# Patient Record
Sex: Female | Born: 1935 | Race: White | Hispanic: No | Smoking: Never smoker
Health system: Southern US, Community
[De-identification: ages and names within clinical notes are randomized; demographics above are authoritative.]

## PROBLEM LIST (undated history)

## (undated) DIAGNOSIS — E039 Hypothyroidism, unspecified: Secondary | ICD-10-CM

## (undated) DIAGNOSIS — I2510 Atherosclerotic heart disease of native coronary artery without angina pectoris: Secondary | ICD-10-CM

## (undated) DIAGNOSIS — N183 Chronic kidney disease, stage 3 unspecified: Secondary | ICD-10-CM

## (undated) DIAGNOSIS — I050 Rheumatic mitral stenosis: Secondary | ICD-10-CM

## (undated) DIAGNOSIS — I4821 Permanent atrial fibrillation: Secondary | ICD-10-CM

## (undated) DIAGNOSIS — W19XXXA Unspecified fall, initial encounter: Secondary | ICD-10-CM

## (undated) DIAGNOSIS — R0600 Dyspnea, unspecified: Secondary | ICD-10-CM

## (undated) DIAGNOSIS — I10 Essential (primary) hypertension: Secondary | ICD-10-CM

## (undated) DIAGNOSIS — I340 Nonrheumatic mitral (valve) insufficiency: Secondary | ICD-10-CM

## (undated) DIAGNOSIS — I639 Cerebral infarction, unspecified: Secondary | ICD-10-CM

## (undated) DIAGNOSIS — I255 Ischemic cardiomyopathy: Secondary | ICD-10-CM

## (undated) DIAGNOSIS — I071 Rheumatic tricuspid insufficiency: Secondary | ICD-10-CM

## (undated) HISTORY — PX: CHOLECYSTECTOMY: SHX55

---

## 1998-02-03 ENCOUNTER — Other Ambulatory Visit: Admission: RE | Admit: 1998-02-03 | Discharge: 1998-02-03 | Payer: Self-pay | Admitting: Obstetrics & Gynecology

## 1999-02-13 ENCOUNTER — Other Ambulatory Visit: Admission: RE | Admit: 1999-02-13 | Discharge: 1999-02-13 | Payer: Self-pay | Admitting: Obstetrics & Gynecology

## 2000-02-15 ENCOUNTER — Other Ambulatory Visit: Admission: RE | Admit: 2000-02-15 | Discharge: 2000-02-15 | Payer: Self-pay | Admitting: Obstetrics & Gynecology

## 2001-03-10 ENCOUNTER — Other Ambulatory Visit: Admission: RE | Admit: 2001-03-10 | Discharge: 2001-03-10 | Payer: Self-pay | Admitting: Obstetrics & Gynecology

## 2002-04-02 ENCOUNTER — Other Ambulatory Visit: Admission: RE | Admit: 2002-04-02 | Discharge: 2002-04-02 | Payer: Self-pay | Admitting: Obstetrics & Gynecology

## 2004-04-21 ENCOUNTER — Other Ambulatory Visit: Admission: RE | Admit: 2004-04-21 | Discharge: 2004-04-21 | Payer: Self-pay | Admitting: Obstetrics & Gynecology

## 2005-05-10 ENCOUNTER — Other Ambulatory Visit: Admission: RE | Admit: 2005-05-10 | Discharge: 2005-05-10 | Payer: Self-pay | Admitting: Obstetrics & Gynecology

## 2005-12-07 ENCOUNTER — Encounter: Admission: RE | Admit: 2005-12-07 | Discharge: 2005-12-07 | Payer: Self-pay | Admitting: Obstetrics & Gynecology

## 2007-03-20 ENCOUNTER — Ambulatory Visit (HOSPITAL_COMMUNITY): Admission: RE | Admit: 2007-03-20 | Discharge: 2007-03-20 | Payer: Self-pay | Admitting: Family Medicine

## 2008-03-16 ENCOUNTER — Encounter: Admission: RE | Admit: 2008-03-16 | Discharge: 2008-03-16 | Payer: Self-pay | Admitting: Internal Medicine

## 2008-04-16 ENCOUNTER — Encounter: Admission: RE | Admit: 2008-04-16 | Discharge: 2008-04-16 | Payer: Self-pay | Admitting: Internal Medicine

## 2008-05-28 ENCOUNTER — Encounter (INDEPENDENT_AMBULATORY_CARE_PROVIDER_SITE_OTHER): Payer: Self-pay | Admitting: Internal Medicine

## 2008-05-28 ENCOUNTER — Ambulatory Visit: Admission: RE | Admit: 2008-05-28 | Discharge: 2008-05-28 | Payer: Self-pay | Admitting: Internal Medicine

## 2008-05-28 ENCOUNTER — Ambulatory Visit: Payer: Self-pay | Admitting: Cardiology

## 2008-10-22 DIAGNOSIS — I639 Cerebral infarction, unspecified: Secondary | ICD-10-CM

## 2008-10-22 HISTORY — DX: Cerebral infarction, unspecified: I63.9

## 2008-11-08 ENCOUNTER — Inpatient Hospital Stay (HOSPITAL_COMMUNITY): Admission: EM | Admit: 2008-11-08 | Discharge: 2008-11-11 | Payer: Self-pay | Admitting: Emergency Medicine

## 2008-11-08 ENCOUNTER — Encounter (INDEPENDENT_AMBULATORY_CARE_PROVIDER_SITE_OTHER): Payer: Self-pay | Admitting: Internal Medicine

## 2008-11-09 ENCOUNTER — Encounter (INDEPENDENT_AMBULATORY_CARE_PROVIDER_SITE_OTHER): Payer: Self-pay | Admitting: Internal Medicine

## 2008-11-18 ENCOUNTER — Inpatient Hospital Stay (HOSPITAL_COMMUNITY): Admission: EM | Admit: 2008-11-18 | Discharge: 2008-11-19 | Payer: Self-pay | Admitting: Emergency Medicine

## 2009-11-24 ENCOUNTER — Encounter: Admission: RE | Admit: 2009-11-24 | Discharge: 2009-11-24 | Payer: Self-pay | Admitting: Internal Medicine

## 2009-12-22 IMAGING — CR DG FOOT COMPLETE 3+V*R*
2 series · 2 of 2 positions shown · non-contrast
Comparison: None

CLINICAL DATA: Twisted ankle several months ago with pain

RIGHT FOOT COMPLETE - 3+ VIEW

[view not recorded (1 of 2)]
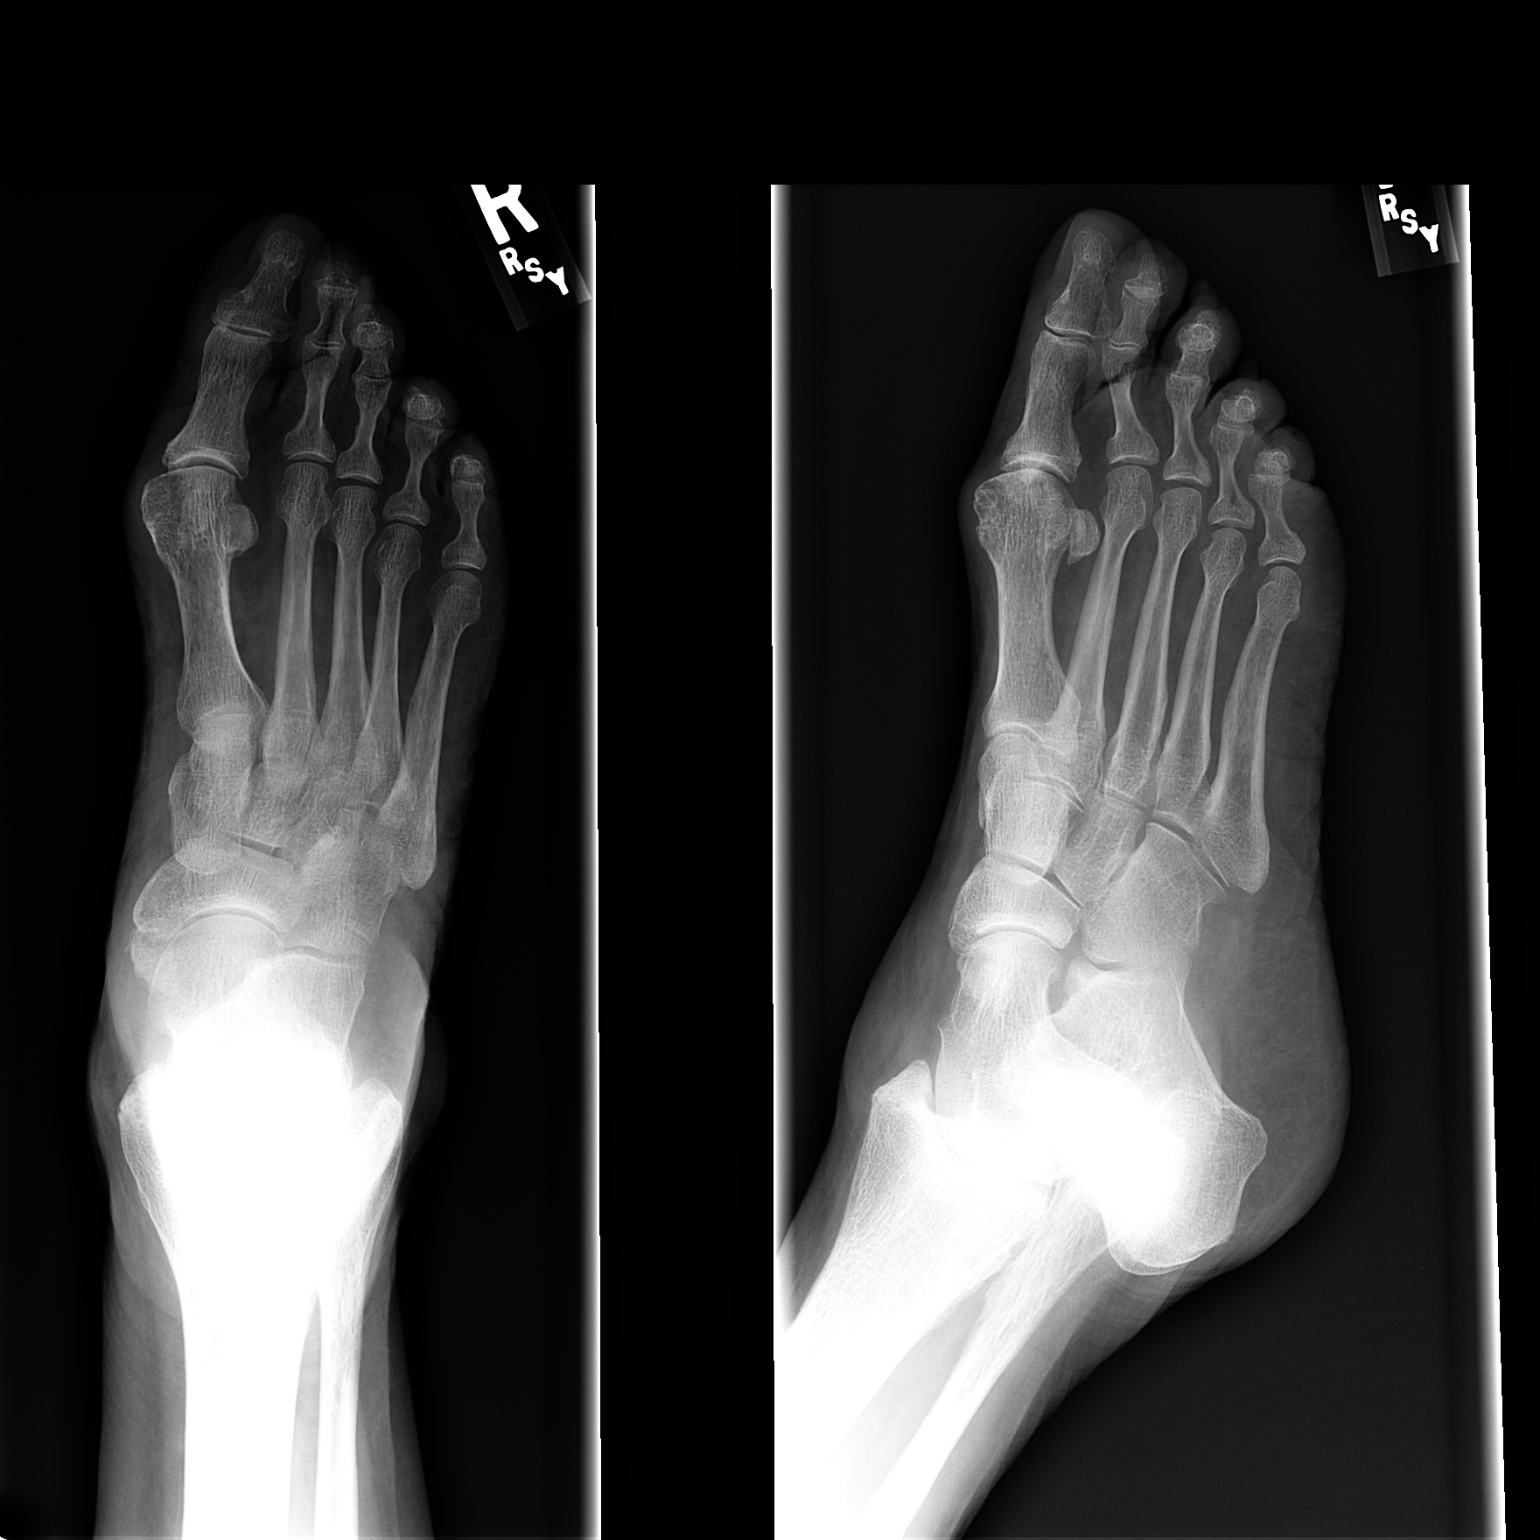

[view not recorded (2 of 2)]
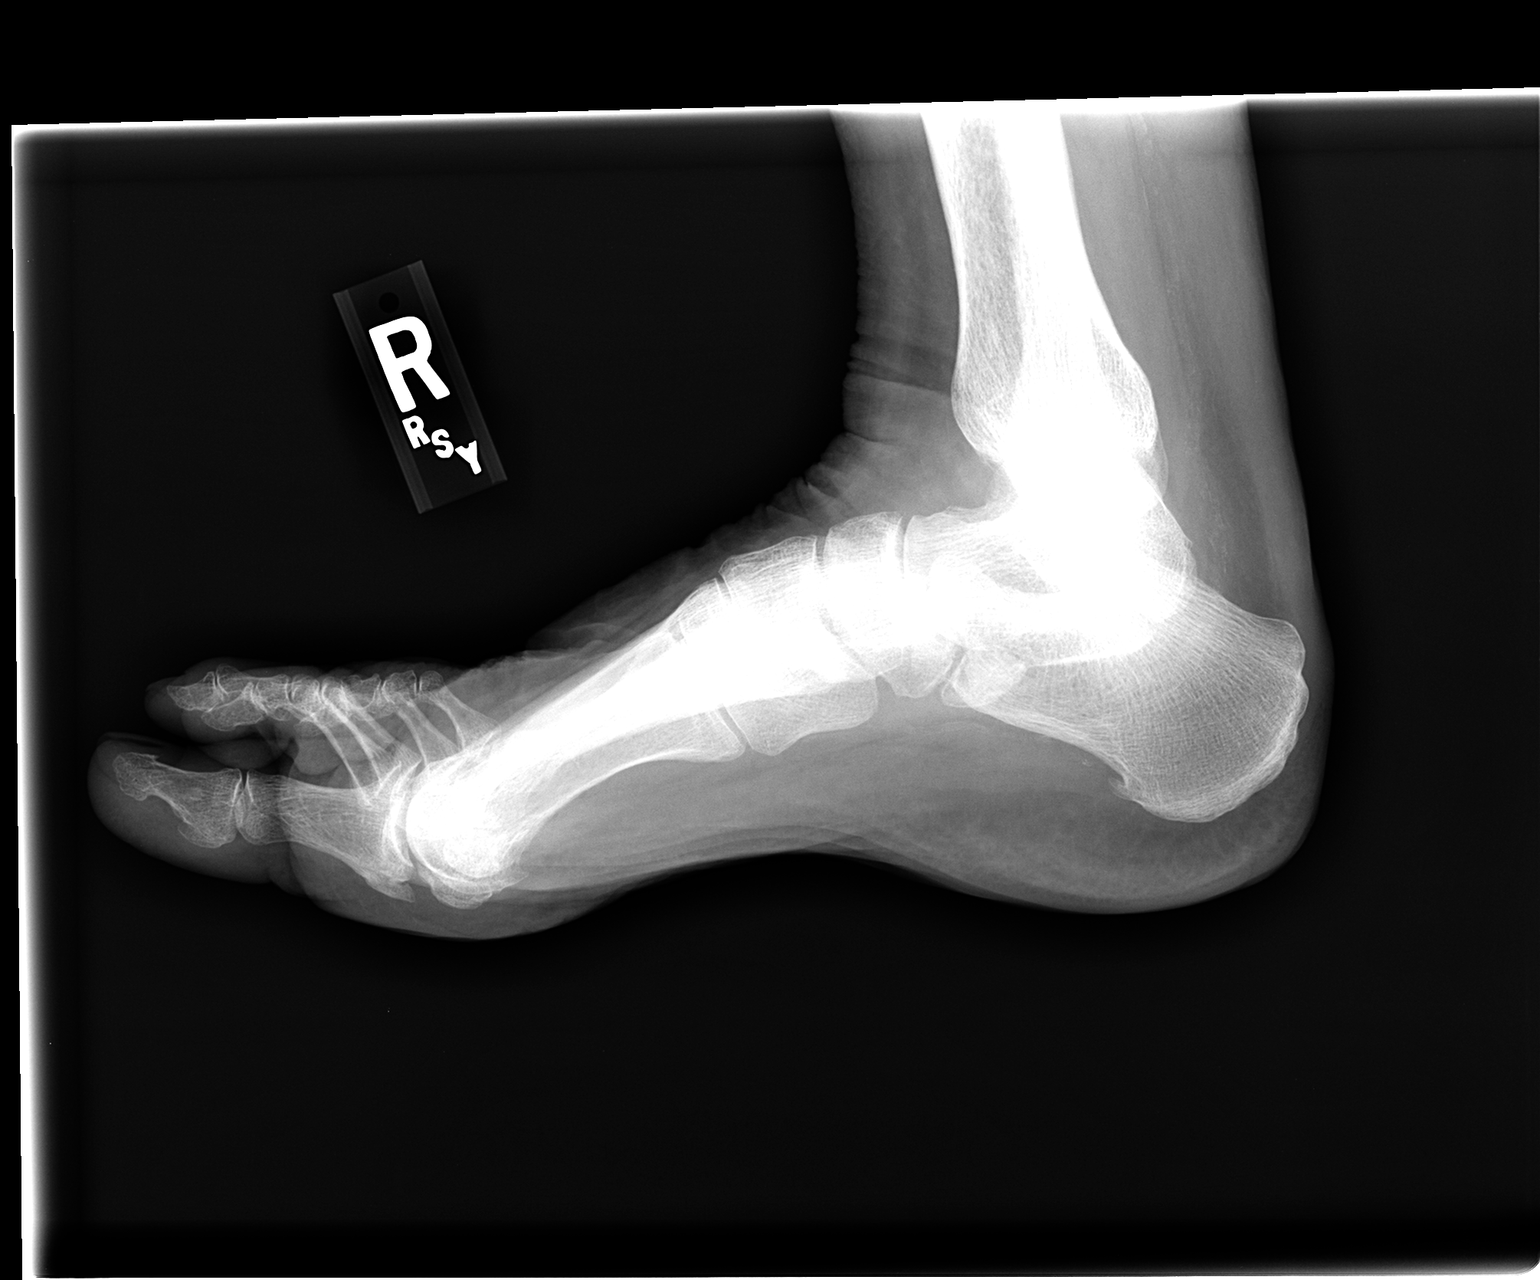

[2 of 2 positions shown; findings below may reference images not displayed]

FINDINGS: There is degenerative change at the right first MTP joint
with mild hallux valgus.  However no acute bony abnormality is seen
and alignment is normal.
IMPRESSION: No acute abnormality.  Mild degenerative change at the right first
MTP joint.

## 2011-02-05 LAB — CBC
HCT: 30.9 % — ABNORMAL LOW (ref 36.0–46.0)
HCT: 32.4 % — ABNORMAL LOW (ref 36.0–46.0)
HCT: 32.8 % — ABNORMAL LOW (ref 36.0–46.0)
HCT: 34.4 % — ABNORMAL LOW (ref 36.0–46.0)
Hemoglobin: 10.6 g/dL — ABNORMAL LOW (ref 12.0–15.0)
Hemoglobin: 11.1 g/dL — ABNORMAL LOW (ref 12.0–15.0)
Hemoglobin: 11.5 g/dL — ABNORMAL LOW (ref 12.0–15.0)
Hemoglobin: 12.2 g/dL (ref 12.0–15.0)
MCHC: 33.4 g/dL (ref 30.0–36.0)
MCHC: 33.7 g/dL (ref 30.0–36.0)
MCV: 92.3 fL (ref 78.0–100.0)
MCV: 92.4 fL (ref 78.0–100.0)
MCV: 93.2 fL (ref 78.0–100.0)
Platelets: 158 10*3/uL (ref 150–400)
Platelets: 167 10*3/uL (ref 150–400)
Platelets: 189 10*3/uL (ref 150–400)
RBC: 3.35 MIL/uL — ABNORMAL LOW (ref 3.87–5.11)
RBC: 3.51 MIL/uL — ABNORMAL LOW (ref 3.87–5.11)
RBC: 3.91 MIL/uL (ref 3.87–5.11)
RDW: 14 % (ref 11.5–15.5)
RDW: 14.2 % (ref 11.5–15.5)
WBC: 6.1 10*3/uL (ref 4.0–10.5)
WBC: 6.1 10*3/uL (ref 4.0–10.5)
WBC: 6.6 10*3/uL (ref 4.0–10.5)

## 2011-02-05 LAB — COMPREHENSIVE METABOLIC PANEL
ALT: 17 U/L (ref 0–35)
Albumin: 2.6 g/dL — ABNORMAL LOW (ref 3.5–5.2)
Albumin: 2.7 g/dL — ABNORMAL LOW (ref 3.5–5.2)
Albumin: 3.1 g/dL — ABNORMAL LOW (ref 3.5–5.2)
Alkaline Phosphatase: 42 U/L (ref 39–117)
Alkaline Phosphatase: 43 U/L (ref 39–117)
Alkaline Phosphatase: 50 U/L (ref 39–117)
BUN: 6 mg/dL (ref 6–23)
BUN: 7 mg/dL (ref 6–23)
CO2: 25 mEq/L (ref 19–32)
Calcium: 7.9 mg/dL — ABNORMAL LOW (ref 8.4–10.5)
Chloride: 107 mEq/L (ref 96–112)
Creatinine, Ser: 1.12 mg/dL (ref 0.4–1.2)
GFR calc non Af Amer: 47 mL/min — ABNORMAL LOW (ref >60)
Glucose, Bld: 123 mg/dL — ABNORMAL HIGH (ref 70–99)
Potassium: 3.4 mEq/L — ABNORMAL LOW (ref 3.5–5.1)
Potassium: 3.5 mEq/L (ref 3.5–5.1)
Potassium: 3.8 mEq/L (ref 3.5–5.1)
Sodium: 138 mEq/L (ref 135–145)
Total Bilirubin: 0.9 mg/dL (ref 0.3–1.2)
Total Protein: 5.5 g/dL — ABNORMAL LOW (ref 6.0–8.3)
Total Protein: 6.4 g/dL (ref 6.0–8.3)

## 2011-02-05 LAB — CULTURE, BLOOD (ROUTINE X 2)

## 2011-02-05 LAB — CARDIAC PANEL(CRET KIN+CKTOT+MB+TROPI)
CK, MB: 0.8 ng/mL (ref 0.3–4.0)
CK, MB: 0.9 ng/mL (ref 0.3–4.0)
Relative Index: INVALID (ref 0.0–2.5)
Total CK: 35 U/L (ref 7–177)
Total CK: 58 U/L (ref 7–177)
Troponin I: 0.01 ng/mL (ref 0.00–0.06)
Troponin I: 0.01 ng/mL (ref 0.00–0.06)

## 2011-02-05 LAB — GLUCOSE, CAPILLARY
Glucose-Capillary: 100 mg/dL — ABNORMAL HIGH (ref 70–99)
Glucose-Capillary: 103 mg/dL — ABNORMAL HIGH (ref 70–99)
Glucose-Capillary: 109 mg/dL — ABNORMAL HIGH (ref 70–99)
Glucose-Capillary: 129 mg/dL — ABNORMAL HIGH (ref 70–99)
Glucose-Capillary: 131 mg/dL — ABNORMAL HIGH (ref 70–99)
Glucose-Capillary: 95 mg/dL (ref 70–99)
Glucose-Capillary: 96 mg/dL (ref 70–99)
Glucose-Capillary: 98 mg/dL (ref 70–99)

## 2011-02-05 LAB — URINALYSIS, ROUTINE W REFLEX MICROSCOPIC
Glucose, UA: NEGATIVE mg/dL
Ketones, ur: 15 mg/dL — AB
Leukocytes, UA: NEGATIVE
Specific Gravity, Urine: 1.019 (ref 1.005–1.030)
Specific Gravity, Urine: 1.023 (ref 1.005–1.030)
Urobilinogen, UA: 0.2 mg/dL (ref 0.0–1.0)
pH: 6.5 (ref 5.0–8.0)

## 2011-02-05 LAB — DIFFERENTIAL
Basophils Relative: 0 % (ref 0–1)
Basophils Relative: 0 % (ref 0–1)
Eosinophils Absolute: 0.1 10*3/uL (ref 0.0–0.7)
Eosinophils Relative: 2 % (ref 0–5)
Monocytes Absolute: 0.6 10*3/uL (ref 0.1–1.0)
Monocytes Relative: 8 % (ref 3–12)
Neutrophils Relative %: 73 % (ref 43–77)

## 2011-02-05 LAB — HEPATIC FUNCTION PANEL
AST: 17 U/L (ref 0–37)
Albumin: 2.7 g/dL — ABNORMAL LOW (ref 3.5–5.2)
Total Bilirubin: 0.8 mg/dL (ref 0.3–1.2)
Total Protein: 5.6 g/dL — ABNORMAL LOW (ref 6.0–8.3)

## 2011-02-05 LAB — TROPONIN I: Troponin I: 0.01 ng/mL (ref 0.00–0.06)

## 2011-02-05 LAB — APTT: aPTT: 24 seconds (ref 24–37)

## 2011-02-05 LAB — BASIC METABOLIC PANEL
BUN: 7 mg/dL (ref 6–23)
CO2: 24 mEq/L (ref 19–32)
CO2: 27 mEq/L (ref 19–32)
Chloride: 104 mEq/L (ref 96–112)
Chloride: 104 mEq/L (ref 96–112)
Chloride: 107 mEq/L (ref 96–112)
GFR calc Af Amer: >60 mL/min (ref >60)
GFR calc Af Amer: >60 mL/min (ref >60)
GFR calc non Af Amer: 49 mL/min — ABNORMAL LOW (ref >60)
Potassium: 3.2 mEq/L — ABNORMAL LOW (ref 3.5–5.1)
Potassium: 3.6 mEq/L (ref 3.5–5.1)
Potassium: 4 mEq/L (ref 3.5–5.1)
Sodium: 136 mEq/L (ref 135–145)
Sodium: 139 mEq/L (ref 135–145)

## 2011-02-05 LAB — CK TOTAL AND CKMB (NOT AT ARMC)
CK, MB: 0.8 ng/mL (ref 0.3–4.0)
Relative Index: INVALID (ref 0.0–2.5)

## 2011-02-05 LAB — URINE MICROSCOPIC-ADD ON

## 2011-02-05 LAB — MAGNESIUM: Magnesium: 1.8 mg/dL (ref 1.5–2.5)

## 2011-02-05 LAB — TSH: TSH: 7.528 u[IU]/mL — ABNORMAL HIGH (ref 0.350–4.500)

## 2011-02-05 LAB — BRAIN NATRIURETIC PEPTIDE: Pro B Natriuretic peptide (BNP): 417 pg/mL — ABNORMAL HIGH (ref 0.0–100.0)

## 2011-02-05 LAB — HEMOGLOBIN A1C
Hgb A1c MFr Bld: 6.3 % — ABNORMAL HIGH (ref 4.6–6.1)
Mean Plasma Glucose: 134 mg/dL

## 2011-02-05 LAB — POCT CARDIAC MARKERS: Myoglobin, poc: 78.5 ng/mL (ref 12–200)

## 2011-02-05 LAB — LIPID PANEL: VLDL: 24 mg/dL (ref 0–40)

## 2011-02-05 LAB — PROTIME-INR
INR: 0.9 (ref 0.00–1.49)
Prothrombin Time: 12.7 seconds (ref 11.6–15.2)

## 2011-02-05 LAB — URINE CULTURE

## 2011-02-05 LAB — T4, FREE: Free T4: 1.03 ng/dL (ref 0.89–1.80)

## 2011-03-06 NOTE — Discharge Summary (Signed)
[name], BOSS            ACCOUNT NO.:  [id number]   MEDICAL RECORD NO.:  [id number]          PATIENT TYPE:  INP   LOCATION:  3031                         FACILITY:  [facility]   PHYSICIAN:  Peggye Pitt, M.D. DATE OF BIRTH:  23-Dec-1923   DATE OF ADMISSION:  11/08/2008  DATE OF DISCHARGE:  11/11/2008                               DISCHARGE SUMMARY   DISCHARGE DIAGNOSES:  1. 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  2. History of hyperattention.  3. Hypothyroidism.  4. Chronic atrial fibrillation.  5. Left sphenoid sinusitis.   DISCHARGE MEDICATIONS:  1. Augmentin 875 mg twice daily until November 16, 2008.  2. Aspirin 81 mg daily.  3. Plavix 75 mg daily.  4. Synthroid 50 mcg daily.  5. Lopressor 12.5 mg daily.   PHYSICIAN AND FOLLOW-UP:  The patient is discharged home in stable  condition.  Please note that she has refused warfarin for her atrial  fibrillation even though she understands the increased risk for stroke.  She is to follow up with Dr. Pearlean Brownie in the outpatient setting and she has  been enrolled in the SENTIS trial.   CONSULTATION THIS HOSPITALIZATION:  Dr. Pearlean Brownie with Neurology.   IMAGES AND PROCEDURES THIS HOSPITALIZATION:  1. Chest x-ray on November 08, 2008 that showed cardiomegaly with no      active disease.  A CT scan of the head on November 08, 2008 that      showed a left sphenoid sinusitis and chronic ischemic changes and      atrophy.  A repeat CT scan without contrast on November 09, 2008      showed a developing cortical infarct in the posterior left temporal      and parietal lobe without hemorrhage or mass.  Appeared extensive      periventricular subcortical white matter hypoattenuation likely      represents chronic microvascular ischemia.  2. On November 08, 2008 a cerebral CT angiogram of the brain consistent      with a large area of hypoperfusion involving the left middle      cerebral artery distribution  with ischemic dead brain tissue in the      anterior opercular area.  A major MCA trunk occlusion I suspect.  3. The patient also had a cerebral angiogram on November 08, 2008      consistent with thrombi in the superior and inferior divisions of      the left middle cerebral artery and mild atherosclerotic disease of      the distal abdominal aorta.  4. The patient also had a 2-D echocardiogram on November 09, 2008 that      showed an ejection fraction of 55% high left ventricular filling      pressures.   HISTORY AND PHYSICAL EXAMINATION:  For full details, please refer to  history and physical dictated on November 08, 2005 by Dr. Sharon Seller, but in  brief Chelsea Fuller is a pleasant 77 year old Caucasian woman who was  living independently.  Apparently a neighbor found her outside near her  trash can garb at about  7:30 in the morning.  Per family report, she  usually goes outside at about 7 in the morning so was thought that she  had only been down for about 30 minutes by the time she was found.  She  was brought into the emergency department for further evaluation.   HOSPITAL COURSE BY ACTIVE PROBLEM:  1. For her acute left MCA territory CVA, she was started on aspirin      and Plavix.  Of note, she refused Coumadin.  Stroke is believed to      be cardioembolic secondary to her AFib.  Neurology in the name of      Dr. Pearlean Brownie has seen her in the hospital and she has been enrolled in      census trial.  As part of that, she has undergone cerebral and      abdominal angiogram and she will need to follow up at 3060 in 90      days.  2. For her hypothyroidism, she was continued on her home dose of      Synthroid.  3. For her atrial fibrillation, she has been rate controlled on      Lopressor.  4. The rest of her chronic medical problems were not an issue this      hospitalization.   VITAL SIGNS ON DAY OF DISCHARGE:  Blood pressure 140/90, heart rate 85,  respirations 18, O2 sats 97% on room  air with a temperature of 97.9.   LABORATORY DATA ON DAY OF DISCHARGE:  Sodium 136, potassium 4.0,  chloride 104, bicarb 26, BUN 7, creatinine 0.94 with a glucose of 102.  WBC 6.1, hemoglobin 11.1 and platelets of 157.      Peggye Pitt, M.D.  Electronically Signed     EH/MEDQ  D:  11/11/2008  T:  11/12/2008  Job:  [id number]   cc:   Pramod P. Pearlean Brownie, MD

## 2011-03-06 NOTE — H&P (Signed)
Chelsea Fuller, Chelsea Fuller            ACCOUNT NO.:  [id number]   MEDICAL RECORD NO.:  [id number]          PATIENT TYPE:  INP   LOCATION:  1828                         FACILITY:  [facility]   PHYSICIAN:  Eduard Clos, MDDATE OF BIRTH:  [birth year]   DATE OF ADMISSION:  11/18/2008  DATE OF DISCHARGE:                              HISTORY & PHYSICAL   PRIMARY CARE PHYSICIAN:  Erskine Speed, M.D.   History obtained from patient's family and ER physician.   CHIEF COMPLAINT:  Confusion.   HISTORY OF PRESENT ILLNESS:  An 77 year old female who was recently  discharged on November 11, 2008, after the patient was treated for acute  left middle cerebral artery CVA.  She was brought into the ER by the  family.  The patient was getting more confused since yesterday morning.  The patient was also complaining of mild headache, presently has some  headache.  The patient did not lose consciousness, had no fever, but did  notice that the patient was feeling cold.  She has not had any shortness  of breath, no cough or productive sputum.  Denied any chest pain,  abdominal pain, nausea, vomiting, diarrhea.  In the ER, the patient had  a CT of the head which did not show any acute findings.  X-ray showed a  mild pleural effusion and the UA was with possibility of UTI.  The  patient has been admitted for further management and evaluation.   PAST MEDICAL HISTORY:  1. Acute left middle cerebral artery territory CVA.  2. Hypothyroidism.  3. Chronic atrial fibrillation, not on Coumadin as the patient had      refused.   PAST SURGICAL HISTORY:  Cholecystectomy.   MEDICATIONS PRIOR TO ADMISSION:  1. Aspirin 81 mg p.o. daily.  2. Plavix 75 mg p.o. daily.  3. Synthroid 50 mg p.o. daily.  4. Lopressor 12.5 mg p.o. daily.   ALLERGIES:  NO KNOWN DRUG ALLERGIES.   FAMILY HISTORY:  Nothing contributory.   SOCIAL HISTORY:  The patient lives with her daughter.  Denies smoking  cigarettes, drinking alcohol or  using illegal drugs.   REVIEW OF SYSTEMS:  As per history of present of illness, nothing else  significant.   PHYSICAL EXAMINATION:  GENERAL:  Patient examined at bedside, not in  acute distress.  VITAL SIGNS:  Blood pressure is 173/91, pulse 90 per minute,  respirations 20 per minute.  O2 sat 93%.  HEENT:  Anicteric, no pallor.  CHEST:  Bilateral air entry present.  No rhonchi, no crepitation.  HEART:  S1-S2 heard.  ABDOMEN:  Soft, nontender.  Bowel sounds heard.  CNS:  The patient is alert and awake.  She is able to recognize her  family, able to say where she is, follows commands.  Moves upper and  lower extremities.  EXTREMITIES:  Peripheral pulses felt.  No edema.   LABORATORY DATA:  CT of the head, nothing acute, shows subacute  infarction in the left parietal lobe, no hemorrhage or new infarction.  Chest x-ray shows cardiomegaly and moderate pulmonary vascular  congestion with probable small left effusion.  CBC - WBC  7, hemoglobin  12.2, hematocrit 36.1, platelets 232, neutrophils 73%.  Metabolic panel;  sodium 138, potassium 3.8, chloride 107, carbon dioxide 25, glucose 123,  BUN 7, creatinine 1, total bilirubin 0.8, alkaline phosphatase 15, AST  23, ALT 17, calcium 8.3.  BNP 417.  UA shows WBCs 3-6, RBCs 7-10,  bacteria rare, leukocytes small and nitrites negative.   ASSESSMENT:  1. Altered mental status, episodic, probably from urinary tract      infection.  2. Possible pneumonia.  3. Recent cerebrovascular accident.  4. Hypothyroidism.  5. Chronic atrial fibrillation.   PLAN:  Admit patient to telemetry.  Will start the patient on empiric  antibiotics.  We will repeat chest x-ray in a.m., obtain blood cultures  and urine cultures.  Get an MRI of the brain.  Place the patient on  neuro-checks.  Continue her aspirin, Plavix and her home medications.  Will get an EKG and follow cardiac enzymes.  Further recommendations as  condition evolves.      Eduard Clos, MD  Electronically Signed     ANK/MEDQ  D:  11/18/2008  T:  11/18/2008  Job:  [id number]

## 2011-03-06 NOTE — Discharge Summary (Signed)
Chelsea Fuller, Chelsea Fuller            ACCOUNT NO.:  [id number]   MEDICAL RECORD NO.:  [id number]          PATIENT TYPE:  INP   LOCATION:  5530                         FACILITY:  [facility]   PHYSICIAN:  Marcellus Scott, MD     DATE OF BIRTH:  [birth year]   DATE OF ADMISSION:  11/18/2008  DATE OF DISCHARGE:  11/19/2008                               DISCHARGE SUMMARY   PRIMARY MEDICAL DOCTOR:  Dr. Nila Nephew.   DISCHARGE DIAGNOSES:  1. Altered mental status, etiology unclear, resolved.  2. Urinary tract infection.  3. Possible pneumonia.  4. Hypothyroidism.  5. Hypokalemia.  6. Chronic anemia - stable.  7. Chronic atrial fibrillation with controlled ventricular rate.  Not      on anticoagulation - the patient refused in the past.   DISCHARGE MEDICATIONS:  1. Synthroid increased to 88 mcg p.o. daily.  2. Levaquin 750 mg p.o. every other day for a week.  3. Enteric-coated aspirin 81 mg p.o. daily.  4. Metoprolol 25 mg tablet, 1/2 tablet p.o. daily.  5. Plavix 75 mg p.o. daily.  6. Prempro 0.625mg /2.5 mg p.o. daily.   PROCEDURES:  1. A chest x-ray on November 19, 2008.  Impression: increased left      pleural effusion and associated bibasilar air space disease.  No      edema.  2. CT of the head without contrast. Impression:  No unexpected      finding.  Subacute infarction in the left parietal lobe.  No      hemorrhage or new infarction.  3. A chest x-ray on November 18, 2008. Cardiomegaly and moderate      pulmonary vascular congestion with probable small left effusion.   PERTINENT LABS:  TSH 7.528.  Cardiac enzymes cycled x3 negative.  Comprehensive metabolic panel remarkable for potassium 3.4, BUN 6,  creatinine 1.11, albumin 2.7, total protein 5.5, CBCs hemoglobin 11.1,  hematocrit 33, white blood cell 6.8, platelets 189.  Urinalysis with 3-6  white blood cells per high high-powered field and rare bacteria.  BNP  was 417. Lipase was 29.   CONSULTATIONS:  None.   HOSPITAL COURSE  AND PATIENT DISPOSITION:  Chelsea Fuller is a pleasant 77-  year-old Caucasian female patient who was recently discharged from the  hospital on November 11, 2008 when she had sustained an acute left middle  cerebral artery stroke.  According to her daughters, who are at the  bedside, she did quite well since discharge until yesterday.  Prior to  yesterday apart from some occasional word-finding difficulties since the  stroke, she was quite coherent.  Since yesterday although she was alert,  she became incoherent.  She was brought to the emergency room.  She then  complained of mild headache.  She denied any fever.  She complains of  some intermittent chronic cough which is mainly nonproductive.  There is  no dysuria but does seem to have some frequency.  There is no chest  pain, abdominal pain, nausea, vomiting or diarrhea.  She was then  admitted for further evaluation and management.   PROBLEM LIST:  1. Altered mental status.  Etiology of this is unclear.  Question      secondary to urinary tract infection and possible pneumonia.  The      patient has not been started on any new medications as an      outpatient.  There was no new stroke on the CT scan.  She does not      have any focal neurological deficits.  The patient declined an MRI      for further evaluation of her brain.  She was initiated on      antibiotics.  The patient has according to her daughters done '1000      times better' with her mental status back to baseline since this      morning which is almost 8-9 hours. The patient denies any      complaints and wants to eagerly go home before the snow storm.      Family has been advised to follow her closely.  2. Urinary tract infection.  The patient was empirically placed on      Avelox, vancomycin, and Zosyn. Her urine cultures are pending.  The      patient has had a T-max of 100.1 degrees Fahrenheit.  She will be      discharged on Levaquin which should cover both her UTI  and presumed      pneumonia.  Her family is encouraged to call back tomorrow or the      day after for the final culture results.  3. Possible pneumonia.  treating with Levaquin.  Recommend repeating      chest x-ray in the next week or two to ensure resolution.  The      patient denies any dyspnea, orthopnea, or leg swelling.  Though her      BNP is mildly elevated there is no clinical overt heart failure.  4. Hypokalemia. Replete prior to discharge.  5. Chronic anemia, stable.  6. Chronic atrial fibrillation with controlled ventricular rate.  The      patient has declined Coumadin in the past.   DISPOSITION:  The patient is eager to go home and is repeatedly  requesting same.  I have had a long discussion with her as well as her  two daughters.  She is hemodynamically stable and her mental status is  back to baseline.  She is also afebrile at this time.  So she will be  discharged home on oral antibiotics.  Her Synthroid which was initially  at 88 mcg for some reason was decreased to 50 mcg, and her TSH has been  elevated on two occasions in the last 3 weeks.  Will increase back her  Synthroid to the original dose of 88 mcg and recommend repeating her TSH  in the next 4-6 weeks.  I have requested family to call me tomorrow or  day after for urine culture results to see if any antibiotic adjustments  need to be made.  Someone will be with the patient at all times.  If  there is any deterioration in her condition, she has been advised to  seek immediate medical attention.  Home health services will be resumed.   Time spent in discharge 40 minutes.    Addendum: Family called and negative urine culture results were relayed.  Patient was advised to complete antibiotics.      Marcellus Scott, MD  Electronically Signed     AH/MEDQ  D:  11/19/2008  T:  11/19/2008  Job:  [id number]  cc:   Lance Muss, M.D.

## 2011-03-06 NOTE — Consult Note (Signed)
NAMEELI, ADAMI            ACCOUNT NO.:  [id number]   MEDICAL RECORD NO.:  [id number]          PATIENT TYPE:  INP   LOCATION:  3110                         FACILITY:  [facility]   PHYSICIAN:  Pramod P. Pearlean Brownie, MD    DATE OF BIRTH:  10/08/1924   DATE OF CONSULTATION:  DATE OF DISCHARGE:                                 CONSULTATION   REFERRING PHYSICIAN:  Dr. Eliot Ford.   REASON FOR REFERRAL:  Stroke.   HISTORY OF PRESENT ILLNESS:  Ms. Jons is an 77 year old pleasant  Caucasian lady who was brought to the emergency room for sudden onset of  aphasia.  The patient is unable to provide history which is obtained  from her daughter and multiple family members who were present at the  bedside.  The patient was apparently last seen normal at 9 o'clock  yesterday when she played bridge with one of her friends.  She usually  gets up at 7 and 7:30 every morning, set up a cup of coffee and changes  cloth and then goes outside to take the trash out.  She apparently did  that today and was found fallen down on the ground at about 7:30 by a  passerby who called the ambulance, EMS brought her to the hospital.  She  was found to be in atrial fibrillation and aphasic.  The patient has not  had any extremity weakness, numbness, or any witnessed seizure activity.  She has no known history of previous strokes, TIAs.  She has a history  of atrial fibrillation, but has refused Coumadin in the past.   PAST MEDICAL HISTORY:  Significant for hypertension, hyperthyroidism,  atrial fibrillation.   HOME MEDICATIONS:  Aspirin and Synthroid.   MEDICATION ALLERGY:  None known.   FAMILY HISTORY:  Noncontributory for stroke.   SOCIAL HISTORY:  The patient lives alone in [place].  She is  independent in activities of daily living.  She does not smoke or drink.   REVIEW OF SYSTEMS:  Negative for any recent fever, cough, chest pain,  diarrhea, or other illness.   PHYSICAL EXAMINATION:  GENERAL:  This is a  pleasant elderly Caucasian  lady who is at present not in distress.  She is afebrile.  VITAL SIGNS:  Pulse rate is 100 per minute, irregular atrial  fibrillation, blood pressure 134/94, respiratory rate is 20 per minute,  sat 97% on room air.  HEAD:  Head is nontraumatic.  NECK:  Supple.  There is no bruit.  ENT:  Unremarkable.  CARDIAC:  No murmur or gallop.  LUNGS:  Clear to auscultation.  NEUROLOGIC:  The patient is pleasant, awake, alert, cooperative.  She is  oriented only to her name.  She follows only occasional simple one step  commands.  She has some socially appropriate speech and speaks short  sentences but is unable to repeat name. Comprehension also seems  significantly affected.  Her eye movements are full range.  She blinks  to threat on the left but not on the right.  She has no facial weakness.  Tongue is midline.  MOTOR SYSTEM:  No upper or lower  extremity drift.  She seems to have  symmetric antigravity strength.  She is not very cooperative for finger-  to-nose and knee-to-heel coordination.  SENSORY SYSTEM:  Cannot be reliably tested.  Plantars are both non-  elicitable.  Gait is not tested.   DATA REVIEWED:  Noncontrast CAT scan of the head performed today reveals  no acute abnormalities.  Admission labs are pending at this time.   IMPRESSION:  An 77 year old lady with sudden onset aphasia likely due to  the left middle cerebral artery infarct, onset probably sometime between  7 and 7:30 today, which is the usual time for her to come out of the  house.  She was presented unfortunately beyond 5 hours and hence will  not qualify for TPA.  The patient may qualify for catheter-based  intervention or provide this patient the SENTIS trial.  I had a long  discussion with the patient's daughter who is next of kin as well as  other family members who were present and discussed available treatment  options including just conservative medical therapy versus participate   in either endovascular catheter-based intervention or SENTIS trial.  Family after due discussion for 10 minutes have decided to consider  participation in a SENTIS trial and noncatheter-based intervention.  We  will see if the patient meets eligibility criteria for the trial.  She  will be admitted for further stroke work up.  We will obtain further  stroke testing in the form of MRI scan, echocardiogram, Doppler studies,  fasting lipid profile, hemoglobin A1c, and homocystine.  Medications for  atrial fibrillation as per the medical service.  The patient is  critically ill, had significant risk for neurological worsening and her  care involves complex decision making, frequent visits, discussion with  families and other physicians.  I have spent 1 hour critical care time  with her care.           ______________________________  Sunny Schlein. Pearlean Brownie, MD     PPS/MEDQ  D:  11/08/2008  T:  11/09/2008  Job:  [id number]

## 2011-03-06 NOTE — H&P (Signed)
[name], [name] NO.:  [id number]   MEDICAL RECORD NO.:  [id number]          PATIENT TYPE:  EMS   LOCATION:  MAJO                         FACILITY:  [facility]   PHYSICIAN:  Lonia Blood, M.D.DATE OF BIRTH:  10-Sep-1924   DATE OF ADMISSION:  11/08/2008  DATE OF DISCHARGE:                              HISTORY & PHYSICAL   PRIMARY CARE PHYSICIAN:  Dr. Nila Nephew   CHIEF COMPLAINT:  Found down/aphasia.   HISTORY OF PRESENT ILLNESS:  Chelsea Fuller is a very pleasant 77-  year-old female who lives independently in the [place] area.  She is  fully functional at her baseline.  This morning, apparently, a neighbor  was leaving the neighborhood and found Ms. Hattery lying in the road out  in front of her house.  Family reports that at the time the patient was  found that she was able to speak, though in very limited short  sentences.  She instructed the person who found her to call her family  members for help and named multiple family members by name.  This was  felt to have been around 7:30 in the morning.  The exact events that  followed thereafter were not clear, but ultimately, EMS was consulted,  and the patient was transferred to the [facility] Emergency Room.  In  the emergency room, the patient was evaluated by the neurology service,  and a study protocol has been initiated under their care.  The [facility] were consulted to do the actual admit paperwork on behalf  of Dr. Nila Nephew.   At the present time, the patient is lying in a stretcher in the [facility] Emergency Room.  She is alert and will follow the examiner with her  eyes.  She moves all 4 extremities spontaneously.  She does not,  however, answer simple questions.  She tends to perseverate with yes  and thank you.   REVIEW OF SYSTEMS:  Unable to be accomplished secondary to aphasia.   PAST MEDICAL HISTORY:  1. Hypertension.  2. Hypothyroidism.  3. Chronic atrial  fibrillation.   HOME MEDICATIONS:  1. Synthroid--dose unclear.  2. Aspirin--dose unclear.   ALLERGIES:  NO KNOWN DRUG ALLERGIES.   FAMILY HISTORY:  Noncontributory to this admission.   SOCIAL HISTORY:  The patient does not smoke.  She does not drink.  She  lives alone and was fully functional previous to this.   DATA REVIEW:  Sodium, potassium, chloride, bicarb, BUN and creatinine  are normal.  Serum glucose is elevated at 144.  Calcium is normal.  CBC  is unremarkable with the exception of mild anemia with a hemoglobin of  11.5 and an MCV of 93.  Coags are normal.  CT scan of the head reveals  probable left sphenoid sinusitis but with chronic ischemic change and  atrophy but no acute change.  Chest x-ray reveals moderate cardiomegaly,  but there is no evidence of acute pulmonary disease.  Urinalysis reveals  moderate hemoglobin, 15 ketones, 30 of protein, and 7-10 white blood  cells, but a Foley catheter is in place.  Point-of-care  cardiac markers  are negative.  EKG reveals atrial fibrillation with controlled  ventricular response rate with no acute ST or T-wave changes.   PHYSICAL EXAMINATION:  VITAL SIGNS:  Temperature 98, blood pressure  148/63, heart rate 96, respiratory 28, and 02 saturation 97% on 3 L per  minute nasal cannula.  CBG is 145.    GENERAL:  Well-developed, well-nourished female in no acute  respiratory distress.  LUNGS:  Clear to auscultation bilaterally without wheezes or rhonchi.  CARDIOVASCULAR:  Irregularly irregular without gallop or rub with  controlled ventricular response rate of approximately 80 beats per  minute at the present time.  ABDOMEN:  Nontender, nondistended, soft bowel sounds present.  No  hepatosplenomegaly, no rebound, or ascites.  EXTREMITIES:  No significant cyanosis, clubbing, edema bilateral lower  extremities.  NEUROLOGIC:  Please see neurologic consultation note.   IMPRESSION AND PLAN:  1. Acute left brain cardiovascular:  Full  care as per neurology.  2. Hypertension:  The patient's blood pressure is currently within      acceptable parameters.  Especially in the setting of an acute      cerebrovascular accident, we do not wish to over-aggressively      correct the patient's modest elevation of blood pressure.  We will,      therefore, follow her without medication at the present time.  3. Hypothyroidism:  The exact dose of the patient's Synthroid is not      clear at the present time.  We will dose the patient empirically      with 50 mcg daily and check a TSH.  4. Chronic atrial fibrillation:  The patient has a reported long      history of atrial fibrillation per her family.  The patient herself      has been frightened of taking Coumadin in the past.  One could      certainly assume that this likely represents a cardioembolic event.      In that the patient is being enrolled in an acute cerebrovascular      accident study as per the neurology group, we will leave all      decisions as to anticoagulation to the neurologist.  At the present      time, the patient's ventricular response rate is well controlled.  5. Hyperglycemia:  The patient does have elevated serum glucose and      capillary blood glucose as noted during her emergency room      evaluation.  We will monitor her CBGs closely as the patient has no      prior history of known diabetes.      Lonia Blood, M.D.  Electronically Signed     JTM/MEDQ  D:  11/08/2008  T:  11/08/2008  Job:  [id number]   cc:   Erskine Speed, M.D.

## 2011-08-24 ENCOUNTER — Other Ambulatory Visit: Payer: Self-pay | Admitting: Internal Medicine

## 2011-08-24 ENCOUNTER — Ambulatory Visit
Admission: RE | Admit: 2011-08-24 | Discharge: 2011-08-24 | Disposition: A | Discharge status: Home / Self Care | Payer: Medicare Other | Source: Ambulatory Visit | Attending: Internal Medicine | Admitting: Internal Medicine

## 2011-08-24 DIAGNOSIS — R52 Pain, unspecified: Secondary | ICD-10-CM

## 2013-10-04 ENCOUNTER — Emergency Department (HOSPITAL_COMMUNITY): Payer: Medicare HMO

## 2013-10-04 ENCOUNTER — Encounter (HOSPITAL_COMMUNITY): Payer: Self-pay | Admitting: Emergency Medicine

## 2013-10-04 ENCOUNTER — Observation Stay (HOSPITAL_COMMUNITY)
Admission: EM | Admit: 2013-10-04 | Discharge: 2013-10-05 | Disposition: A | Discharge status: Home / Self Care | Payer: Medicare HMO | Attending: Internal Medicine | Admitting: Internal Medicine

## 2013-10-04 DIAGNOSIS — Z7982 Long term (current) use of aspirin: Secondary | ICD-10-CM | POA: Insufficient documentation

## 2013-10-04 DIAGNOSIS — W010XXA Fall on same level from slipping, tripping and stumbling without subsequent striking against object, initial encounter: Secondary | ICD-10-CM | POA: Insufficient documentation

## 2013-10-04 DIAGNOSIS — Z8673 Personal history of transient ischemic attack (TIA), and cerebral infarction without residual deficits: Secondary | ICD-10-CM

## 2013-10-04 DIAGNOSIS — I4891 Unspecified atrial fibrillation: Secondary | ICD-10-CM | POA: Insufficient documentation

## 2013-10-04 DIAGNOSIS — S0010XA Contusion of unspecified eyelid and periocular area, initial encounter: Secondary | ICD-10-CM | POA: Insufficient documentation

## 2013-10-04 DIAGNOSIS — D649 Anemia, unspecified: Secondary | ICD-10-CM | POA: Diagnosis present

## 2013-10-04 DIAGNOSIS — W19XXXA Unspecified fall, initial encounter: Secondary | ICD-10-CM | POA: Insufficient documentation

## 2013-10-04 DIAGNOSIS — S022XXA Fracture of nasal bones, initial encounter for closed fracture: Principal | ICD-10-CM

## 2013-10-04 DIAGNOSIS — E039 Hypothyroidism, unspecified: Secondary | ICD-10-CM | POA: Diagnosis present

## 2013-10-04 DIAGNOSIS — I10 Essential (primary) hypertension: Secondary | ICD-10-CM | POA: Diagnosis present

## 2013-10-04 DIAGNOSIS — R040 Epistaxis: Secondary | ICD-10-CM | POA: Diagnosis present

## 2013-10-04 DIAGNOSIS — Y9229 Other specified public building as the place of occurrence of the external cause: Secondary | ICD-10-CM | POA: Insufficient documentation

## 2013-10-04 HISTORY — DX: Essential (primary) hypertension: I10

## 2013-10-04 HISTORY — DX: Cerebral infarction, unspecified: I63.9

## 2013-10-04 HISTORY — DX: Hypothyroidism, unspecified: E03.9

## 2013-10-04 LAB — BASIC METABOLIC PANEL
BUN: 18 mg/dL (ref 6–23)
Calcium: 9.1 mg/dL (ref 8.4–10.5)
GFR calc non Af Amer: 42 mL/min — ABNORMAL LOW (ref >90)
Glucose, Bld: 215 mg/dL — ABNORMAL HIGH (ref 70–99)

## 2013-10-04 LAB — CBC
HCT: 34.4 % — ABNORMAL LOW (ref 36.0–46.0)
Hemoglobin: 11.6 g/dL — ABNORMAL LOW (ref 12.0–15.0)
MCH: 31.9 pg (ref 26.0–34.0)
MCHC: 33.7 g/dL (ref 30.0–36.0)
MCV: 94.5 fL (ref 78.0–100.0)

## 2013-10-04 LAB — CG4 I-STAT (LACTIC ACID): Lactic Acid, Venous: 2.6 mmol/L — ABNORMAL HIGH (ref 0.5–2.2)

## 2013-10-04 LAB — TYPE AND SCREEN

## 2013-10-04 MED ORDER — CEFAZOLIN SODIUM 1-5 GM-% IV SOLN
1.0000 g | Freq: Once | INTRAVENOUS | Status: AC
  Administered 2013-10-04: 1 g via INTRAVENOUS
  Filled 2013-10-04: qty 50

## 2013-10-04 MED ORDER — ONDANSETRON HCL 4 MG PO TABS: 4.0000 mg | ORAL_TABLET | Freq: Four times a day (QID) | ORAL | Status: DC | PRN

## 2013-10-04 MED ORDER — ONDANSETRON HCL 4 MG/2ML IJ SOLN
INTRAMUSCULAR | Status: AC
  Administered 2013-10-04: 4 mg via INTRAVENOUS
  Filled 2013-10-04: qty 2

## 2013-10-04 MED ORDER — ONDANSETRON HCL 4 MG/2ML IJ SOLN: 4.0000 mg | Freq: Four times a day (QID) | INTRAMUSCULAR | Status: DC | PRN

## 2013-10-04 MED ORDER — ACETAMINOPHEN 650 MG RE SUPP: 650.0000 mg | Freq: Four times a day (QID) | RECTAL | Status: DC | PRN

## 2013-10-04 MED ORDER — LEVOTHYROXINE SODIUM 88 MCG PO TABS
88.0000 ug | ORAL_TABLET | Freq: Every day | ORAL | Status: DC
  Administered 2013-10-05: 08:00:00 88 ug via ORAL
  Filled 2013-10-04 (×2): qty 1

## 2013-10-04 MED ORDER — IRBESARTAN 150 MG PO TABS
150.0000 mg | ORAL_TABLET | Freq: Every day | ORAL | Status: DC
  Administered 2013-10-05: 150 mg via ORAL
  Filled 2013-10-04: qty 1

## 2013-10-04 MED ORDER — DILTIAZEM HCL ER BEADS 120 MG PO CP24
120.0000 mg | ORAL_CAPSULE | Freq: Every day | ORAL | Status: DC
  Administered 2013-10-05: 10:00:00 120 mg via ORAL
  Filled 2013-10-04: qty 1

## 2013-10-04 MED ORDER — SODIUM CHLORIDE 0.9 % IV BOLUS (SEPSIS)
1000.0000 mL | Freq: Once | INTRAVENOUS | Status: AC
  Administered 2013-10-04: 1000 mL via INTRAVENOUS

## 2013-10-04 MED ORDER — ONDANSETRON HCL 4 MG/2ML IJ SOLN: 4.0000 mg | Freq: Once | INTRAMUSCULAR | Status: DC

## 2013-10-04 MED ORDER — SODIUM CHLORIDE 0.9 % IV SOLN
INTRAVENOUS | Status: DC
  Administered 2013-10-05: via INTRAVENOUS

## 2013-10-04 MED ORDER — ACETAMINOPHEN 325 MG PO TABS
650.0000 mg | ORAL_TABLET | Freq: Four times a day (QID) | ORAL | Status: DC | PRN
  Administered 2013-10-05: 650 mg via ORAL
  Filled 2013-10-04 (×2): qty 2

## 2013-10-04 MED ORDER — ONDANSETRON HCL 4 MG/2ML IJ SOLN
4.0000 mg | Freq: Once | INTRAMUSCULAR | Status: AC
  Administered 2013-10-04: 4 mg via INTRAVENOUS

## 2013-10-04 MED ORDER — SODIUM CHLORIDE 0.9 % IJ SOLN: 3.0000 mL | Freq: Two times a day (BID) | INTRAMUSCULAR | Status: DC

## 2013-10-04 NOTE — Progress Notes (Signed)
Attempted to receive report.  Phone number left for call back.

## 2013-10-04 NOTE — ED Provider Notes (Signed)
CSN: [id number]     Arrival date & time 10/04/13  2025 History   First MD Initiated Contact with Patient 10/04/13 2026     Chief Complaint  Patient presents with  . Fall   (Consider location/radiation/quality/duration/timing/severity/associated sxs/prior Treatment) HPI Comments: Tripped walking out of church. Patient landed on her nose and face. Brisk R sided nosebleed ensued.  Patient is a 79 y.o. female presenting with fall. The history is provided by the spouse.  Fall This is a new problem. The current episode started less than 1 hour ago. Episode frequency: once. The problem has been resolved. Pertinent negatives include no chest pain and no shortness of breath. Nothing aggravates the symptoms. Nothing relieves the symptoms.    Past Medical History  Diagnosis Date  . Hypertension   . CVA (cerebral infarction)    No past surgical history on file. No family history on file. History  Substance Use Topics  . Smoking status: Not on file  . Smokeless tobacco: Not on file  . Alcohol Use: Not on file   OB History   Grav Para Term Preterm Abortions TAB SAB Ect Mult Living                 Review of Systems  Constitutional: Negative for fever.  Respiratory: Negative for cough and shortness of breath.   Cardiovascular: Negative for chest pain and leg swelling.  All other systems reviewed and are negative.    Allergies  Review of patient's allergies indicates no known allergies.  Home Medications   Current Outpatient Rx  Name  Route  Sig  Dispense  Refill  . levothyroxine (SYNTHROID, LEVOTHROID) 88 MCG tablet   Oral   Take 88 mcg by mouth daily before breakfast.         . clopidogrel (PLAVIX) 75 MG tablet   Oral   Take 75 mg by mouth daily.         Marland Kitchen diltiazem (TIAZAC) 120 MG 24 hr capsule   Oral   Take 120 mg by mouth daily.         . traMADol (ULTRAM) 50 MG tablet   Oral   Take 50 mg by mouth every 4 (four) hours.         .  valsartan-hydrochlorothiazide (DIOVAN-HCT) 160-12.5 MG per tablet   Oral   Take 1 tablet by mouth daily.          BP 179/70  Pulse 119  Temp(Src) 97.4 F (36.3 C) (Oral)  Resp 24  SpO2 93% Physical Exam  Nursing note and vitals reviewed. Constitutional: She is oriented to person, place, and time. She appears well-developed and well-nourished. No distress.  HENT:  Head: Normocephalic.  Right Ear: External ear normal.  Left Ear: External ear normal.  Nose: No rhinorrhea, nose lacerations, sinus tenderness, septal deviation or nasal septal hematoma. Epistaxis (brisk, R sided) is observed. Right sinus exhibits maxillary sinus tenderness and frontal sinus tenderness. Left sinus exhibits maxillary sinus tenderness and frontal sinus tenderness.  Mouth/Throat: No oropharyngeal exudate, posterior oropharyngeal edema, posterior oropharyngeal erythema or tonsillar abscesses.  Eyes: EOM are normal. Pupils are equal, round, and reactive to light.  Neck: Normal range of motion. Neck supple.  Cardiovascular: Normal rate and regular rhythm.  Exam reveals no friction rub.   No murmur heard. Pulmonary/Chest: Effort normal and breath sounds normal. No respiratory distress. She has no wheezes. She has no rales.  Abdominal: Soft. She exhibits no distension. There is no tenderness. There is no rebound.  Musculoskeletal: Normal range of motion. She exhibits no edema.  Neurological: She is alert and oriented to person, place, and time.  Skin: She is not diaphoretic.    ED Course  EPISTAXIS MANAGEMENT Date/Time: 10/04/2013 11:22 PM Performed by: Dagmar Hait Authorized by: Dagmar Hait Consent: The procedure was performed in an emergent situation. Patient sedated: no Treatment site: right posterior Repair method: merocel sponge (Posterior Merocel pack) Post-procedure assessment: bleeding stopped Treatment complexity: complex Patient tolerance: Patient tolerated the procedure well  with no immediate complications. Comments: Neosynephrine spray used for hemostatic control   (including critical care time) Labs Review Labs Reviewed  CBC  BASIC METABOLIC PANEL  APTT  PROTIME-INR  TYPE AND SCREEN   Imaging Review Ct Head Wo Contrast  10/04/2013   CLINICAL DATA:  Fall.  Plavix use.  EXAM: CT HEAD WITHOUT CONTRAST  CT MAXILLOFACIAL WITHOUT CONTRAST  CT CERVICAL SPINE WITHOUT CONTRAST  TECHNIQUE: Multidetector CT imaging of the head, cervical spine, and maxillofacial structures were performed using the standard protocol without intravenous contrast. Multiplanar CT image reconstructions of the cervical spine and maxillofacial structures were also generated.  COMPARISON:  11/18/2008 head CT  FINDINGS: CT HEAD FINDINGS  Skull and Sinuses:Reference dedicated face imaging concerning nasal arch fractures. There is a central forehead hematoma without underlying calvarial fracture. Chronic sinusitis of the left sphenoid, reference dedicated imaging.  Orbits: Bilateral cataract resection.  Brain: No evidence of acute abnormality, such as acute infarction, hemorrhage, hydrocephalus, or mass lesion/mass effect. Remote small vessel ischemic white matter disease, pattern stable from 2010. There is been interval evolution of anterior and posterior, predominantly subcortical, border zone infarcts in the left cerebral hemisphere. Cerebral volume loss, similar to prior.  CT MAXILLOFACIAL FINDINGS  Comminuted fracturing of nasal arch. There is a left laterally displaced fracture through the left nasomaxillary suture. On the right, there is segmental fracturing of the nasal bridge which is depressed, involving both the nasal bone and anterior process of the maxilla. Hemorrhage casts the right nasal cavity, were there is also likely packing.  Central forehead hematoma without underlying calvarial fracture.  Bilateral cataract resection.  No evidence of orbital abnormality.  Chronic subtotal opacification  of the left sphenoid sinus with wall thickening and central mineralization.Findings consistent with chronic sinusitis.  CT CERVICAL SPINE FINDINGS  No evidence of acute fracture or traumatic subluxation. Anterolisthesis at C4-5 and C7-T1 can be explained by advanced facet osteoarthritis. Diffuse degenerative change, with severe multi level disc narrowing, especially at C3-4 and C5-6. Diffuse facet osteoarthritis, worse on the left, with bulky spurring.  Biapical calcified pleural plaques.  Carotid atherosclerosis.  IMPRESSION: 1. No acute intracranial findings. 2. Comminuted bilateral nasal arch fractures with leftward displacement of the nasal bridge. 3. No evidence of acute cervical spine fracture.   Electronically Signed   By: Tiburcio Pea M.D.   On: 10/04/2013 22:13   Ct Cervical Spine Wo Contrast  10/04/2013   CLINICAL DATA:  Fall.  Plavix use.  EXAM: CT HEAD WITHOUT CONTRAST  CT MAXILLOFACIAL WITHOUT CONTRAST  CT CERVICAL SPINE WITHOUT CONTRAST  TECHNIQUE: Multidetector CT imaging of the head, cervical spine, and maxillofacial structures were performed using the standard protocol without intravenous contrast. Multiplanar CT image reconstructions of the cervical spine and maxillofacial structures were also generated.  COMPARISON:  11/18/2008 head CT  FINDINGS: CT HEAD FINDINGS  Skull and Sinuses:Reference dedicated face imaging concerning nasal arch fractures. There is a central forehead hematoma without underlying calvarial fracture. Chronic sinusitis of the left  sphenoid, reference dedicated imaging.  Orbits: Bilateral cataract resection.  Brain: No evidence of acute abnormality, such as acute infarction, hemorrhage, hydrocephalus, or mass lesion/mass effect. Remote small vessel ischemic white matter disease, pattern stable from 2010. There is been interval evolution of anterior and posterior, predominantly subcortical, border zone infarcts in the left cerebral hemisphere. Cerebral volume loss, similar  to prior.  CT MAXILLOFACIAL FINDINGS  Comminuted fracturing of nasal arch. There is a left laterally displaced fracture through the left nasomaxillary suture. On the right, there is segmental fracturing of the nasal bridge which is depressed, involving both the nasal bone and anterior process of the maxilla. Hemorrhage casts the right nasal cavity, were there is also likely packing.  Central forehead hematoma without underlying calvarial fracture.  Bilateral cataract resection.  No evidence of orbital abnormality.  Chronic subtotal opacification of the left sphenoid sinus with wall thickening and central mineralization.Findings consistent with chronic sinusitis.  CT CERVICAL SPINE FINDINGS  No evidence of acute fracture or traumatic subluxation. Anterolisthesis at C4-5 and C7-T1 can be explained by advanced facet osteoarthritis. Diffuse degenerative change, with severe multi level disc narrowing, especially at C3-4 and C5-6. Diffuse facet osteoarthritis, worse on the left, with bulky spurring.  Biapical calcified pleural plaques.  Carotid atherosclerosis.  IMPRESSION: 1. No acute intracranial findings. 2. Comminuted bilateral nasal arch fractures with leftward displacement of the nasal bridge. 3. No evidence of acute cervical spine fracture.   Electronically Signed   By: Tiburcio Pea M.D.   On: 10/04/2013 22:13   Ct Maxillofacial Wo Cm  10/04/2013   CLINICAL DATA:  Fall.  Plavix use.  EXAM: CT HEAD WITHOUT CONTRAST  CT MAXILLOFACIAL WITHOUT CONTRAST  CT CERVICAL SPINE WITHOUT CONTRAST  TECHNIQUE: Multidetector CT imaging of the head, cervical spine, and maxillofacial structures were performed using the standard protocol without intravenous contrast. Multiplanar CT image reconstructions of the cervical spine and maxillofacial structures were also generated.  COMPARISON:  11/18/2008 head CT  FINDINGS: CT HEAD FINDINGS  Skull and Sinuses:Reference dedicated face imaging concerning nasal arch fractures. There is  a central forehead hematoma without underlying calvarial fracture. Chronic sinusitis of the left sphenoid, reference dedicated imaging.  Orbits: Bilateral cataract resection.  Brain: No evidence of acute abnormality, such as acute infarction, hemorrhage, hydrocephalus, or mass lesion/mass effect. Remote small vessel ischemic white matter disease, pattern stable from 2010. There is been interval evolution of anterior and posterior, predominantly subcortical, border zone infarcts in the left cerebral hemisphere. Cerebral volume loss, similar to prior.  CT MAXILLOFACIAL FINDINGS  Comminuted fracturing of nasal arch. There is a left laterally displaced fracture through the left nasomaxillary suture. On the right, there is segmental fracturing of the nasal bridge which is depressed, involving both the nasal bone and anterior process of the maxilla. Hemorrhage casts the right nasal cavity, were there is also likely packing.  Central forehead hematoma without underlying calvarial fracture.  Bilateral cataract resection.  No evidence of orbital abnormality.  Chronic subtotal opacification of the left sphenoid sinus with wall thickening and central mineralization.Findings consistent with chronic sinusitis.  CT CERVICAL SPINE FINDINGS  No evidence of acute fracture or traumatic subluxation. Anterolisthesis at C4-5 and C7-T1 can be explained by advanced facet osteoarthritis. Diffuse degenerative change, with severe multi level disc narrowing, especially at C3-4 and C5-6. Diffuse facet osteoarthritis, worse on the left, with bulky spurring.  Biapical calcified pleural plaques.  Carotid atherosclerosis.  IMPRESSION: 1. No acute intracranial findings. 2. Comminuted bilateral nasal arch fractures with leftward  displacement of the nasal bridge. 3. No evidence of acute cervical spine fracture.   Electronically Signed   By: Tiburcio Pea M.D.   On: 10/04/2013 22:13    EKG Interpretation    Date/Time:    Ventricular Rate:     PR Interval:    QRS Duration:   QT Interval:    QTC Calculation:   R Axis:     Text Interpretation:              CRITICAL CARE Performed by: Dagmar Hait   Total critical care time: 45 minutes  Critical care time was exclusive of separately billable procedures and treating other patients.  Critical care was necessary to treat or prevent imminent or life-threatening deterioration.  Critical care was time spent personally by me on the following activities: development of treatment plan with patient and/or surrogate as well as nursing, discussions with consultants, evaluation of patient's response to treatment, examination of patient, obtaining history from patient or surrogate, ordering and performing treatments and interventions, ordering and review of laboratory studies, ordering and review of radiographic studies, pulse oximetry and re-evaluation of patient's condition.   MDM   1. Epistaxis   2. Anemia   3. Atrial fibrillation   4. Nasal fracture, closed, initial encounter    79 year old female with history of stroke on Plavix presents status post a fall. She was walking out of church and tripped. She fell flat on her face. No loss of consciousness. She started to have a brisk nosebleed. She lost roughly 200 mL of blood from her mouth with EMS. She was put on a spinal board and collar. Upon arrival here, vigorous suctioning of her mouth produced large amounts of blood and blood clots. She was talking her airway is intact, but she was having brisk bleeding from the back of her nose falling into her mouth. She had good lung sounds no gross deformities and she is alert and oriented. She was quickly well to assess for spinal checks which was all normal. She is taken off the spine board and sat up. On quick inspection she had a very brisk nosebleed from the right side. We placed Neo-Synephrine nasal spray her nose after she blew clots out of her nose. After 3 sprays of  Neo-Synephrine I attempted to place a posterior pack using a Rhino Rocket device. This did not pass. I was able to place a posterior pack with a thin Maricela dressing. This subsequently stopped the nosebleed. She had no anterior nosebleed that I could see. She had no left-sided nose bleeding. All the nosebleed was on the right and a pack was placed in the right hand. She had no neck tenderness, no chest tenderness, no deformity tenderness. She does have some swelling of the bridge of her nose with extensive bruising on the bridge of her nose and forehead. All of her teeth are located in she has no mandibular swelling. She has no midface instability. Patient's heart rate in the 120s to 130s. This is likely secondary to blood loss. We'll check labs, check coags. We will also give her 1 L of normal saline. We'll scan her head, neck, face.  Scans show bilateral nasal arch fractures. Pack continuing to have hemostatic control. Dr. Emeline Darling will see patient, made NPO by his request. Dr. Toniann Fail admitting.   Dagmar Hait, MD 10/05/13 [phone]

## 2013-10-04 NOTE — H&P (Addendum)
[facility] History and Physical  Chelsea Fuller ZOX:[id number] DOB: [birth year] DOA: 10/04/2013  Referring physician: ER physician. PCP: No primary provider on file.   Chief Complaint: Fall with nasal injury.  HPI: Chelsea Fuller is a 79 y.o. female with history of chronic atrial fibrillation on Plavix and aspirin had a [place] at [employer] today following which patient developed epistaxis. In the ER patient had CT head C-spine and maxillofacial. CAT scan showed nasal bone fracture. EDP initially tried to pack the nose but patient started having mild bleed again and at this point Dr. Emeline Darling, ENT surgeon was consulted and at this time patient admitted with further workup. Patient denies any loss of consciousness chest pain palpitations nausea vomiting abdominal pain diarrhea shortness of breath.   Review of Systems: As presented in the history of presenting illness, rest negative.  Past Medical History  Diagnosis Date  . Hypertension   . CVA (cerebral infarction)   . Hyperthyroidism    Past Surgical History  Procedure Laterality Date  . Cholecystectomy     Social History:  reports that she has never smoked. She does not have any smokeless tobacco history on file. She reports that she does not drink alcohol or use illicit drugs. Where does patient live home. Can patient participate in ADLs? Yes.  No Known Allergies  Family History:  Family History  Problem Relation Age of Onset  . Diabetes Mellitus II Son       Prior to Admission medications   Medication Sig Start Date End Date Taking? Authorizing Provider  acetaminophen (TYLENOL) 500 MG tablet Take 500 mg by mouth every 6 (six) hours as needed for mild pain.   Yes Historical Provider, MD  aspirin EC 81 MG tablet Take 81 mg by mouth daily.   Yes Historical Provider, MD  clopidogrel (PLAVIX) 75 MG tablet Take 75 mg by mouth daily. 07/06/13  Yes Historical Provider, MD  diltiazem (TIAZAC) 120 MG 24 hr capsule  Take 120 mg by mouth daily. 09/26/13  Yes Historical Provider, MD  levothyroxine (SYNTHROID, LEVOTHROID) 88 MCG tablet Take 88 mcg by mouth daily before breakfast.   Yes Historical Provider, MD  valsartan-hydrochlorothiazide (DIOVAN-HCT) 160-12.5 MG per tablet Take 1 tablet by mouth daily. 08/04/13  Yes Historical Provider, MD    Physical Exam: Filed Vitals:   10/04/13 2045 10/04/13 2100 10/04/13 2115 10/04/13 2223  BP:    149/61  Pulse: 120  98 105  Temp:      TempSrc:      Resp: 23 15  23   SpO2: 92%  94% 97%     General:  Well-developed well-nourished.  Eyes: Anicteric no pallor.  ENT: Nose has been packed. There is ecchymosis around the face.  Neck: No mass felt.  Cardiovascular: S1-S2 heard.  Respiratory: No rhonchi or crepitations.  Abdomen: Soft nontender bowel sounds present.  Skin: Bruise around the nose.   Musculoskeletal: No edema.  Psychiatric:  Appears normal.  Neurologic:  Alert awake oriented to time place and person. Moves all extremities.  Labs on Admission:  Basic Metabolic Panel:  Recent Labs Lab 10/04/13 2050  NA 139  K 3.3*  CL 99  CO2 26  GLUCOSE 215*  BUN 18  CREATININE 1.14*  CALCIUM 9.1   Liver Function Tests: No results found for this basename: AST, ALT, ALKPHOS, BILITOT, PROT, ALBUMIN,  in the last 168 hours No results found for this basename: LIPASE, AMYLASE,  in the last 168 hours No results found for  this basename: AMMONIA,  in the last 168 hours CBC:  Recent Labs Lab 10/04/13 2050  WBC 7.2  HGB 11.6*  HCT 34.4*  MCV 94.5  PLT 181   Cardiac Enzymes: No results found for this basename: CKTOTAL, CKMB, CKMBINDEX, TROPONINI,  in the last 168 hours  BNP (last 3 results) No results found for this basename: PROBNP,  in the last 8760 hours CBG: No results found for this basename: GLUCAP,  in the last 168 hours  Radiological Exams on Admission: Ct Head Wo Contrast  10/04/2013   CLINICAL DATA:  Fall.  Plavix use.  EXAM:  CT HEAD WITHOUT CONTRAST  CT MAXILLOFACIAL WITHOUT CONTRAST  CT CERVICAL SPINE WITHOUT CONTRAST  TECHNIQUE: Multidetector CT imaging of the head, cervical spine, and maxillofacial structures were performed using the standard protocol without intravenous contrast. Multiplanar CT image reconstructions of the cervical spine and maxillofacial structures were also generated.  COMPARISON:  11/18/2008 head CT  FINDINGS: CT HEAD FINDINGS  Skull and Sinuses:Reference dedicated face imaging concerning nasal arch fractures. There is a central forehead hematoma without underlying calvarial fracture. Chronic sinusitis of the left sphenoid, reference dedicated imaging.  Orbits: Bilateral cataract resection.  Brain: No evidence of acute abnormality, such as acute infarction, hemorrhage, hydrocephalus, or mass lesion/mass effect. Remote small vessel ischemic white matter disease, pattern stable from 2010. There is been interval evolution of anterior and posterior, predominantly subcortical, border zone infarcts in the left cerebral hemisphere. Cerebral volume loss, similar to prior.  CT MAXILLOFACIAL FINDINGS  Comminuted fracturing of nasal arch. There is a left laterally displaced fracture through the left nasomaxillary suture. On the right, there is segmental fracturing of the nasal bridge which is depressed, involving both the nasal bone and anterior process of the maxilla. Hemorrhage casts the right nasal cavity, were there is also likely packing.  Central forehead hematoma without underlying calvarial fracture.  Bilateral cataract resection.  No evidence of orbital abnormality.  Chronic subtotal opacification of the left sphenoid sinus with wall thickening and central mineralization.Findings consistent with chronic sinusitis.  CT CERVICAL SPINE FINDINGS  No evidence of acute fracture or traumatic subluxation. Anterolisthesis at C4-5 and C7-T1 can be explained by advanced facet osteoarthritis. Diffuse degenerative change, with  severe multi level disc narrowing, especially at C3-4 and C5-6. Diffuse facet osteoarthritis, worse on the left, with bulky spurring.  Biapical calcified pleural plaques.  Carotid atherosclerosis.  IMPRESSION: 1. No acute intracranial findings. 2. Comminuted bilateral nasal arch fractures with leftward displacement of the nasal bridge. 3. No evidence of acute cervical spine fracture.   Electronically Signed   By: Tiburcio Pea M.D.   On: 10/04/2013 22:13   Ct Cervical Spine Wo Contrast  10/04/2013   CLINICAL DATA:  Fall.  Plavix use.  EXAM: CT HEAD WITHOUT CONTRAST  CT MAXILLOFACIAL WITHOUT CONTRAST  CT CERVICAL SPINE WITHOUT CONTRAST  TECHNIQUE: Multidetector CT imaging of the head, cervical spine, and maxillofacial structures were performed using the standard protocol without intravenous contrast. Multiplanar CT image reconstructions of the cervical spine and maxillofacial structures were also generated.  COMPARISON:  11/18/2008 head CT  FINDINGS: CT HEAD FINDINGS  Skull and Sinuses:Reference dedicated face imaging concerning nasal arch fractures. There is a central forehead hematoma without underlying calvarial fracture. Chronic sinusitis of the left sphenoid, reference dedicated imaging.  Orbits: Bilateral cataract resection.  Brain: No evidence of acute abnormality, such as acute infarction, hemorrhage, hydrocephalus, or mass lesion/mass effect. Remote small vessel ischemic white matter disease, pattern stable from 2010.  There is been interval evolution of anterior and posterior, predominantly subcortical, border zone infarcts in the left cerebral hemisphere. Cerebral volume loss, similar to prior.  CT MAXILLOFACIAL FINDINGS  Comminuted fracturing of nasal arch. There is a left laterally displaced fracture through the left nasomaxillary suture. On the right, there is segmental fracturing of the nasal bridge which is depressed, involving both the nasal bone and anterior process of the maxilla. Hemorrhage  casts the right nasal cavity, were there is also likely packing.  Central forehead hematoma without underlying calvarial fracture.  Bilateral cataract resection.  No evidence of orbital abnormality.  Chronic subtotal opacification of the left sphenoid sinus with wall thickening and central mineralization.Findings consistent with chronic sinusitis.  CT CERVICAL SPINE FINDINGS  No evidence of acute fracture or traumatic subluxation. Anterolisthesis at C4-5 and C7-T1 can be explained by advanced facet osteoarthritis. Diffuse degenerative change, with severe multi level disc narrowing, especially at C3-4 and C5-6. Diffuse facet osteoarthritis, worse on the left, with bulky spurring.  Biapical calcified pleural plaques.  Carotid atherosclerosis.  IMPRESSION: 1. No acute intracranial findings. 2. Comminuted bilateral nasal arch fractures with leftward displacement of the nasal bridge. 3. No evidence of acute cervical spine fracture.   Electronically Signed   By: Tiburcio Pea M.D.   On: 10/04/2013 22:13   Ct Maxillofacial Wo Cm  10/04/2013   CLINICAL DATA:  Fall.  Plavix use.  EXAM: CT HEAD WITHOUT CONTRAST  CT MAXILLOFACIAL WITHOUT CONTRAST  CT CERVICAL SPINE WITHOUT CONTRAST  TECHNIQUE: Multidetector CT imaging of the head, cervical spine, and maxillofacial structures were performed using the standard protocol without intravenous contrast. Multiplanar CT image reconstructions of the cervical spine and maxillofacial structures were also generated.  COMPARISON:  11/18/2008 head CT  FINDINGS: CT HEAD FINDINGS  Skull and Sinuses:Reference dedicated face imaging concerning nasal arch fractures. There is a central forehead hematoma without underlying calvarial fracture. Chronic sinusitis of the left sphenoid, reference dedicated imaging.  Orbits: Bilateral cataract resection.  Brain: No evidence of acute abnormality, such as acute infarction, hemorrhage, hydrocephalus, or mass lesion/mass effect. Remote small vessel  ischemic white matter disease, pattern stable from 2010. There is been interval evolution of anterior and posterior, predominantly subcortical, border zone infarcts in the left cerebral hemisphere. Cerebral volume loss, similar to prior.  CT MAXILLOFACIAL FINDINGS  Comminuted fracturing of nasal arch. There is a left laterally displaced fracture through the left nasomaxillary suture. On the right, there is segmental fracturing of the nasal bridge which is depressed, involving both the nasal bone and anterior process of the maxilla. Hemorrhage casts the right nasal cavity, were there is also likely packing.  Central forehead hematoma without underlying calvarial fracture.  Bilateral cataract resection.  No evidence of orbital abnormality.  Chronic subtotal opacification of the left sphenoid sinus with wall thickening and central mineralization.Findings consistent with chronic sinusitis.  CT CERVICAL SPINE FINDINGS  No evidence of acute fracture or traumatic subluxation. Anterolisthesis at C4-5 and C7-T1 can be explained by advanced facet osteoarthritis. Diffuse degenerative change, with severe multi level disc narrowing, especially at C3-4 and C5-6. Diffuse facet osteoarthritis, worse on the left, with bulky spurring.  Biapical calcified pleural plaques.  Carotid atherosclerosis.  IMPRESSION: 1. No acute intracranial findings. 2. Comminuted bilateral nasal arch fractures with leftward displacement of the nasal bridge. 3. No evidence of acute cervical spine fracture.   Electronically Signed   By: Tiburcio Pea M.D.   On: 10/04/2013 22:13    EKG: Independently reviewed.  Atrial  fibrillation rate controlled.  Assessment/Plan Principal Problem:   Epistaxis Active Problems:   Anemia   Nasal fracture   Atrial fibrillation   History of CVA (cerebrovascular accident)   HTN (hypertension)   Hypothyroidism   1.  Nasal bone fracture status post mechanical fall with active bleed - at this time I have  discontinued aspirin and Plavix and have ordered repeat CBC to closely follow any further decrease in hemoglobin to see if patient may require transfusion. Dr. Emeline Darling ENT surgeon to see patient. I have also continued patient on Ancef. Patient's lactic acid level was high and I have ordered a repeat lab level in a.m. 2. Chronic atrial fibrillation presently rate controlled - I have held patient's Plavix and aspirin due to epistaxis. Patient has not taken Coumadin previously as patient refused. 3. History of CVA - presently holding aspirin and Plavix due to #1. 4. Hypertension - continue present medications. 5. Hypothyroidism - continue present medications. 6. Anemia probably chronic - follow CBC.  I have reviewed patient's old charts and previous labs compared with the present.    Code Status:  Full code.  Family Communication:  Patient's family at the bedside.  Disposition Plan:  Admit to inpatient.    Merlin Ege N. [facility] Pager [phone].  If 7PM-7AM, please contact night-coverage [email or web address] Password [email or web address] 10/04/2013, 11:06 PM

## 2013-10-04 NOTE — ED Notes (Signed)
Walden MD at bedside. 

## 2013-10-04 NOTE — ED Notes (Signed)
Pt arrived via [facility], c/o bleeding after fall onto cement. Pt A and O x 4 with no LOC. 200 ml blood suctioned from mouth. Hematoma noted forehead.

## 2013-10-05 ENCOUNTER — Inpatient Hospital Stay (HOSPITAL_COMMUNITY): Payer: Medicare HMO

## 2013-10-05 DIAGNOSIS — I10 Essential (primary) hypertension: Secondary | ICD-10-CM

## 2013-10-05 DIAGNOSIS — Z8673 Personal history of transient ischemic attack (TIA), and cerebral infarction without residual deficits: Secondary | ICD-10-CM

## 2013-10-05 LAB — COMPREHENSIVE METABOLIC PANEL
ALT: 13 U/L (ref 0–35)
AST: 18 U/L (ref 0–37)
Albumin: 2.9 g/dL — ABNORMAL LOW (ref 3.5–5.2)
Alkaline Phosphatase: 45 U/L (ref 39–117)
BUN: 29 mg/dL — ABNORMAL HIGH (ref 6–23)
CO2: 29 mEq/L (ref 19–32)
Chloride: 105 mEq/L (ref 96–112)
Potassium: 3.9 mEq/L (ref 3.5–5.1)
Sodium: 143 mEq/L (ref 135–145)
Total Bilirubin: 0.2 mg/dL — ABNORMAL LOW (ref 0.3–1.2)
Total Protein: 5.7 g/dL — ABNORMAL LOW (ref 6.0–8.3)

## 2013-10-05 LAB — CBC WITH DIFFERENTIAL/PLATELET
Basophils Absolute: 0 10*3/uL (ref 0.0–0.1)
Eosinophils Absolute: 0.1 10*3/uL (ref 0.0–0.7)
Eosinophils Relative: 1 % (ref 0–5)
HCT: 27.6 % — ABNORMAL LOW (ref 36.0–46.0)
Lymphocytes Relative: 20 % (ref 12–46)
MCH: 31 pg (ref 26.0–34.0)
MCHC: 33 g/dL (ref 30.0–36.0)
MCV: 93.9 fL (ref 78.0–100.0)
Monocytes Absolute: 0.9 10*3/uL (ref 0.1–1.0)
Platelets: 155 10*3/uL (ref 150–400)
RBC: 2.94 MIL/uL — ABNORMAL LOW (ref 3.87–5.11)
RDW: 13.4 % (ref 11.5–15.5)
WBC: 8.2 10*3/uL (ref 4.0–10.5)

## 2013-10-05 LAB — TSH: TSH: 0.653 u[IU]/mL (ref 0.350–4.500)

## 2013-10-05 MED ORDER — AMOXICILLIN-POT CLAVULANATE 875-125 MG PO TABS: 1.0000 | ORAL_TABLET | Freq: Two times a day (BID) | ORAL | Status: DC

## 2013-10-05 MED ORDER — MORPHINE SULFATE 2 MG/ML IJ SOLN: 1.0000 mg | INTRAMUSCULAR | Status: DC | PRN

## 2013-10-05 MED ORDER — CEFAZOLIN SODIUM 1-5 GM-% IV SOLN
1.0000 g | Freq: Two times a day (BID) | INTRAVENOUS | Status: DC
  Administered 2013-10-05: 1 g via INTRAVENOUS
  Filled 2013-10-05 (×2): qty 50

## 2013-10-05 MED ORDER — LORAZEPAM 0.5 MG PO TABS
0.5000 mg | ORAL_TABLET | Freq: Once | ORAL | Status: AC
  Administered 2013-10-05: 0.5 mg via ORAL
  Filled 2013-10-05: qty 1

## 2013-10-05 MED ORDER — WHITE PETROLATUM GEL
Status: AC
  Administered 2013-10-05: 0.2
  Filled 2013-10-05: qty 5

## 2013-10-05 NOTE — Evaluation (Addendum)
Physical Therapy Evaluation Patient Details Name: Chelsea Fuller MRN: [id number] DOB: 1924/04/25 Today's Date: 10/05/2013 Time: 7829-5621 PT Time Calculation (min): 36 min  PT Assessment / Plan / Recommendation History of Present Illness  Pt is an 79 y/o female [place] [employer], with continuing nose bleed and swollen, painful left wrist (negative for fx on imaging).  Clinical Impression  This patient presents with acute pain and decreased functional independence following the above mentioned procedure. At the time of PT eval, pt very unsteady with ambulation without an AD, however with RW, pt was able to ambulate safely with supervision. This patient is appropriate for skilled PT interventions in the acute care setting to address functional limitations, improve safety and independence with functional mobility, and return to PLOF.     PT Assessment  Patient needs continued PT services    Follow Up Recommendations  Home health PT    Does the patient have the potential to tolerate intense rehabilitation      Barriers to Discharge        Equipment Recommendations  None recommended by PT    Recommendations for Other Services     Frequency Min 2X/week    Precautions / Restrictions Precautions Precautions: Fall Restrictions Weight Bearing Restrictions: No   Pertinent Vitals/Pain Pt reports mild pain in her L wrist at rest, but was able to negotiate with a RW well without complaints.      Mobility  Bed Mobility Bed Mobility: Supine to Sit;Sitting - Scoot to Edge of Bed Supine to Sit: 4: Min guard;HOB elevated Sitting - Scoot to Edge of Bed: 5: Supervision Details for Bed Mobility Assistance: VC's for sequencing and technique.  Transfers Transfers: Sit to Stand;Stand to Sit Sit to Stand: 4: Min guard;From bed;With upper extremity assist Stand to Sit: 4: Min guard;To chair/3-in-1;With upper extremity assist Details for Transfer Assistance: VC's for  hand placement on seated surface for safety.  Ambulation/Gait Ambulation/Gait Assistance: 4: Min guard Ambulation Distance (Feet): 175 Feet Assistive device: Rolling walker Ambulation/Gait Assistance Details: VC's for sequencing and walker placement for safety. Pt frequently held walker out too far in front of her, causing her to lean forward. Gait Pattern: Step-through pattern;Decreased stride length Gait velocity: Decreased    Exercises     PT Diagnosis: Difficulty walking;Generalized weakness  PT Problem List: Decreased strength;Decreased range of motion;Decreased activity tolerance;Decreased balance;Decreased mobility;Decreased knowledge of use of DME;Decreased safety awareness;Decreased knowledge of precautions;Pain PT Treatment Interventions: DME instruction;Gait training;Functional mobility training;Stair training;Therapeutic activities;Therapeutic exercise;Neuromuscular re-education;Patient/family education     PT Goals(Current goals can be found in the care plan section) Acute Rehab PT Goals Patient Stated Goal: To return home PT Goal Formulation: With patient/family Time For Goal Achievement: 10/12/13 Potential to Achieve Goals: Good  Visit Information  Last PT Received On: 10/05/13 Assistance Needed: +1 History of Present Illness: Pt is an 78 y/o female admitted [place] [employer], with continuing nose bleed and swollen, painful left wrist (negative for fx on imaging).       Prior Functioning  Home Living Family/patient expects to be discharged to:: Private residence Living Arrangements: Alone Available Help at Discharge: Family;Available 24 hours/day (3 daughters in the area) Type of Home: House Home Access: Stairs to enter [employer] of Steps: 1 Entrance Stairs-Rails: Left Home Layout: One level Home Equipment: Walker - 2 wheels;Bedside commode;Shower seat Prior Function Level of Independence: Independent Comments: Daughter reports  balance issues and unsteadiness at home, however she states that pt was  always careful and has never fallen in the past. Communication Communication: No difficulties Dominant Hand: Right    Cognition  Cognition Arousal/Alertness: Awake/alert Behavior During Therapy: WFL for tasks assessed/performed Overall Cognitive Status: Within Functional Limits for tasks assessed    Extremity/Trunk Assessment Upper Extremity Assessment Upper Extremity Assessment: LUE deficits/detail LUE Deficits / Details: Therapist noted edema and bruising in L wrist. Pt reports it is painful however she had no trouble negotiating a RW. LUE: Unable to fully assess due to pain Lower Extremity Assessment Lower Extremity Assessment: Generalized weakness Cervical / Trunk Assessment Cervical / Trunk Assessment: Kyphotic   Balance Balance Balance Assessed: Yes Static Sitting Balance Static Sitting - Balance Support: Feet supported;No upper extremity supported Static Sitting - Level of Assistance: 5: Stand by assistance Static Standing Balance Static Standing - Balance Support: No upper extremity supported Static Standing - Level of Assistance: 4: Min assist Dynamic Standing Balance Dynamic Standing - Balance Support: No upper extremity supported Dynamic Standing - Level of Assistance: 4: Min assist  End of Session PT - End of Session Equipment Utilized During Treatment: Gait belt Activity Tolerance: Patient tolerated treatment well Patient left: in chair;with call bell/phone within reach;with family/visitor present Nurse Communication: Mobility status  GP     10/27/13 1351  PT G-Codes **NOT FOR INPATIENT CLASS**  Functional Assessment Tool Used clinical judgement  Functional Limitation Mobility: Walking and moving around  Mobility: Walking and Moving Around Current Status [phone]) CJ  Mobility: Walking and Moving Around Goal Status [phone]) Lowell Guitar 10/27/2013, 3:10 PM  Ruthann Cancer, PT,  DPT [phone]

## 2013-10-05 NOTE — Discharge Summary (Signed)
Physician Discharge Summary  Chelsea Fuller NWG:[id number] DOB: 1924/06/22 DOA: 10/04/2013  PCP: Enrique Sack, MD  Admit date: 10/04/2013 Discharge date: 10/05/2013  Time spent: 40 minutes  Recommendations for Outpatient Follow-up:  1. Followup with primary care physician within one week. 2. Followup with ENT in 5-7 days to remove the nasal packing  Discharge Diagnoses:  Principal Problem:   Epistaxis Active Problems:   Anemia   Nasal fracture   Atrial fibrillation   History of CVA (cerebrovascular accident)   HTN (hypertension)   Hypothyroidism   Discharge Condition: Stable  Diet recommendation: Heart healthy diet  Filed Weights   10/04/13 2352  Weight: 63.594 kg (140 lb 3.2 oz)    History of present illness:  Chelsea Fuller is a 79 y.o. female with history of chronic atrial fibrillation on Plavix and aspirin had a mechanical fall at church today following which patient developed epistaxis. In the ER patient had CT head C-spine and maxillofacial. CAT scan showed nasal bone fracture. EDP initially tried to pack the nose but patient started having mild bleed again and at this point Dr. Emeline Darling, ENT surgeon was consulted and at this time patient admitted with further workup. Patient denies any loss of consciousness chest pain palpitations nausea vomiting abdominal pain diarrhea shortness of breath.   Hospital Course:   1. Nasal bone fracture: Status post reduction done by ENT, this is resulted from a mechanical fall, patient said she fell in her church. Presented with nasal bone fracture and epistaxis. Patient is on aspirin and Plavix. They're both discontinued at the time of admission, because of history of atrial fibrillation and previous stroke asked her to restart the low dose aspirin, hold Plavix for 7 days.  2. Epistaxis: This is secondary to the nasal bone fracture and recent fall, patient also had bruising around the eyes (raccoon eyes), bleeding stopped by  nasal packing, Dr. Emeline Darling recommended to leave the nasal pack for 5-7 days and remove it in his office. Recommended also to be on antibiotics as long of the nasal Packs in-place, Augmentin prescribed for 7 more days.  3. Chronic atrial fibrillation: Currently rate controlled, patient is on Plavix and aspirin, these were held secondary to the bleeding on admission. At the time of discharge low-dose aspirin continued plans are to stop Plavix for 7 days.  4. Hypertension/history of CVA: No current issues continue previous medications.  Procedures:  Reduction of nasal fracture and nasal packing for epistaxis.  Consultations:  Dr. Emeline Darling of [facility].  Discharge Exam: Filed Vitals:   10/05/13 0533  BP: 122/66  Pulse: 84  Temp: 97.9 F (36.6 C)  Resp: 20   General: Alert and awake, oriented x3, not in any acute distress. HEENT: anicteric sclera, pupils reactive to light and accommodation, EOMI CVS: S1-S2 clear, no murmur rubs or gallops Chest: clear to auscultation bilaterally, no wheezing, rales or rhonchi Abdomen: soft nontender, nondistended, normal bowel sounds, no organomegaly Extremities: no cyanosis, clubbing or edema noted bilaterally Neuro: Cranial nerves II-XII intact, no focal neurological deficits  Discharge Instructions      Discharge Orders   Future Orders Complete By Expires   Diet - low sodium heart healthy  As directed    Increase activity slowly  As directed        Medication List    STOP taking these medications       clopidogrel 75 MG tablet  Commonly known as:  PLAVIX      TAKE these medications  acetaminophen 500 MG tablet  Commonly known as:  TYLENOL  Take 500 mg by mouth every 6 (six) hours as needed for mild pain.     amoxicillin-clavulanate 875-125 MG per tablet  Commonly known as:  AUGMENTIN  Take 1 tablet by mouth 2 (two) times daily.     aspirin EC 81 MG tablet  Take 81 mg by mouth daily.     diltiazem 120 MG 24 hr capsule   Commonly known as:  TIAZAC  Take 120 mg by mouth daily.     levothyroxine 88 MCG tablet  Commonly known as:  SYNTHROID, LEVOTHROID  Take 88 mcg by mouth daily before breakfast.     valsartan-hydrochlorothiazide 160-12.5 MG per tablet  Commonly known as:  DIOVAN-HCT  Take 1 tablet by mouth daily.       No Known Allergies Follow-up Information   Follow up with Melvenia Beam, MD In 5 days.   Specialty:  Otolaryngology   Contact information:   [address] [place] [address] [phone]       Follow up with GREEN, Lorenda Ishihara, MD In 1 week.   Specialty:  Internal Medicine   Contact information:   [address] [place] [address] [phone]        The results of significant diagnostics from this hospitalization (including imaging, microbiology, ancillary and laboratory) are listed below for reference.    Significant Diagnostic Studies: Dg Wrist Complete Left  2013/10/20   CLINICAL DATA:  Posterior wrist pain  EXAM: LEFT WRIST - COMPLETE 3+ VIEW  COMPARISON:  None.  FINDINGS: There is no fracture or dislocation. There are severe degenerative changes of the 1st [facility] joint. There are mild degenerative changes of the triscaphe joint. There is generalized osteopenia. There is soft tissue swelling overlying the dorsal wrist. Atherosclerotic changes are noted.  IMPRESSION: No acute osseous injury of the left wrist. If there is clinical concern regarding a radiographically occult scaphoid fracture, snuff box tenderness or ligamentous injury, then further assessment with MRI is recommended given the degree of osteopenia.   Electronically Signed   By: Elige Ko   On: 10/20/13 09:20   Ct Head Wo Contrast  10/04/2013   CLINICAL DATA:  Fall.  Plavix use.  EXAM: CT HEAD WITHOUT CONTRAST  CT MAXILLOFACIAL WITHOUT CONTRAST  CT CERVICAL SPINE WITHOUT CONTRAST  TECHNIQUE: Multidetector CT imaging of the head, cervical spine, and maxillofacial structures  were performed using the standard protocol without intravenous contrast. Multiplanar CT image reconstructions of the cervical spine and maxillofacial structures were also generated.  COMPARISON:  11/18/2008 head CT  FINDINGS: CT HEAD FINDINGS  Skull and Sinuses:Reference dedicated face imaging concerning nasal arch fractures. There is a central forehead hematoma without underlying calvarial fracture. Chronic sinusitis of the left sphenoid, reference dedicated imaging.  Orbits: Bilateral cataract resection.  Brain: No evidence of acute abnormality, such as acute infarction, hemorrhage, hydrocephalus, or mass lesion/mass effect. Remote small vessel ischemic white matter disease, pattern stable from 2010. There is been interval evolution of anterior and posterior, predominantly subcortical, border zone infarcts in the left cerebral hemisphere. Cerebral volume loss, similar to prior.  CT MAXILLOFACIAL FINDINGS  Comminuted fracturing of nasal arch. There is a left laterally displaced fracture through the left nasomaxillary suture. On the right, there is segmental fracturing of the nasal bridge which is depressed, involving both the nasal bone and anterior process of the maxilla. Hemorrhage casts the right nasal cavity, were there is also  likely packing.  Central forehead hematoma without underlying calvarial fracture.  Bilateral cataract resection.  No evidence of orbital abnormality.  Chronic subtotal opacification of the left sphenoid sinus with wall thickening and central mineralization.Findings consistent with chronic sinusitis.  CT CERVICAL SPINE FINDINGS  No evidence of acute fracture or traumatic subluxation. Anterolisthesis at C4-5 and C7-T1 can be explained by advanced facet osteoarthritis. Diffuse degenerative change, with severe multi level disc narrowing, especially at C3-4 and C5-6. Diffuse facet osteoarthritis, worse on the left, with bulky spurring.  Biapical calcified pleural plaques.  Carotid  atherosclerosis.  IMPRESSION: 1. No acute intracranial findings. 2. Comminuted bilateral nasal arch fractures with leftward displacement of the nasal bridge. 3. No evidence of acute cervical spine fracture.   Electronically Signed   By: Tiburcio Pea M.D.   On: 10/04/2013 22:13   Ct Cervical Spine Wo Contrast  10/04/2013   CLINICAL DATA:  Fall.  Plavix use.  EXAM: CT HEAD WITHOUT CONTRAST  CT MAXILLOFACIAL WITHOUT CONTRAST  CT CERVICAL SPINE WITHOUT CONTRAST  TECHNIQUE: Multidetector CT imaging of the head, cervical spine, and maxillofacial structures were performed using the standard protocol without intravenous contrast. Multiplanar CT image reconstructions of the cervical spine and maxillofacial structures were also generated.  COMPARISON:  11/18/2008 head CT  FINDINGS: CT HEAD FINDINGS  Skull and Sinuses:Reference dedicated face imaging concerning nasal arch fractures. There is a central forehead hematoma without underlying calvarial fracture. Chronic sinusitis of the left sphenoid, reference dedicated imaging.  Orbits: Bilateral cataract resection.  Brain: No evidence of acute abnormality, such as acute infarction, hemorrhage, hydrocephalus, or mass lesion/mass effect. Remote small vessel ischemic white matter disease, pattern stable from 2010. There is been interval evolution of anterior and posterior, predominantly subcortical, border zone infarcts in the left cerebral hemisphere. Cerebral volume loss, similar to prior.  CT MAXILLOFACIAL FINDINGS  Comminuted fracturing of nasal arch. There is a left laterally displaced fracture through the left nasomaxillary suture. On the right, there is segmental fracturing of the nasal bridge which is depressed, involving both the nasal bone and anterior process of the maxilla. Hemorrhage casts the right nasal cavity, were there is also likely packing.  Central forehead hematoma without underlying calvarial fracture.  Bilateral cataract resection.  No evidence of  orbital abnormality.  Chronic subtotal opacification of the left sphenoid sinus with wall thickening and central mineralization.Findings consistent with chronic sinusitis.  CT CERVICAL SPINE FINDINGS  No evidence of acute fracture or traumatic subluxation. Anterolisthesis at C4-5 and C7-T1 can be explained by advanced facet osteoarthritis. Diffuse degenerative change, with severe multi level disc narrowing, especially at C3-4 and C5-6. Diffuse facet osteoarthritis, worse on the left, with bulky spurring.  Biapical calcified pleural plaques.  Carotid atherosclerosis.  IMPRESSION: 1. No acute intracranial findings. 2. Comminuted bilateral nasal arch fractures with leftward displacement of the nasal bridge. 3. No evidence of acute cervical spine fracture.   Electronically Signed   By: Tiburcio Pea M.D.   On: 10/04/2013 22:13   Ct Maxillofacial Wo Cm  10/04/2013   CLINICAL DATA:  Fall.  Plavix use.  EXAM: CT HEAD WITHOUT CONTRAST  CT MAXILLOFACIAL WITHOUT CONTRAST  CT CERVICAL SPINE WITHOUT CONTRAST  TECHNIQUE: Multidetector CT imaging of the head, cervical spine, and maxillofacial structures were performed using the standard protocol without intravenous contrast. Multiplanar CT image reconstructions of the cervical spine and maxillofacial structures were also generated.  COMPARISON:  11/18/2008 head CT  FINDINGS: CT HEAD FINDINGS  Skull and Sinuses:Reference dedicated face imaging concerning  nasal arch fractures. There is a central forehead hematoma without underlying calvarial fracture. Chronic sinusitis of the left sphenoid, reference dedicated imaging.  Orbits: Bilateral cataract resection.  Brain: No evidence of acute abnormality, such as acute infarction, hemorrhage, hydrocephalus, or mass lesion/mass effect. Remote small vessel ischemic white matter disease, pattern stable from 2010. There is been interval evolution of anterior and posterior, predominantly subcortical, border zone infarcts in the left  cerebral hemisphere. Cerebral volume loss, similar to prior.  CT MAXILLOFACIAL FINDINGS  Comminuted fracturing of nasal arch. There is a left laterally displaced fracture through the left nasomaxillary suture. On the right, there is segmental fracturing of the nasal bridge which is depressed, involving both the nasal bone and anterior process of the maxilla. Hemorrhage casts the right nasal cavity, were there is also likely packing.  Central forehead hematoma without underlying calvarial fracture.  Bilateral cataract resection.  No evidence of orbital abnormality.  Chronic subtotal opacification of the left sphenoid sinus with wall thickening and central mineralization.Findings consistent with chronic sinusitis.  CT CERVICAL SPINE FINDINGS  No evidence of acute fracture or traumatic subluxation. Anterolisthesis at C4-5 and C7-T1 can be explained by advanced facet osteoarthritis. Diffuse degenerative change, with severe multi level disc narrowing, especially at C3-4 and C5-6. Diffuse facet osteoarthritis, worse on the left, with bulky spurring.  Biapical calcified pleural plaques.  Carotid atherosclerosis.  IMPRESSION: 1. No acute intracranial findings. 2. Comminuted bilateral nasal arch fractures with leftward displacement of the nasal bridge. 3. No evidence of acute cervical spine fracture.   Electronically Signed   By: Tiburcio Pea M.D.   On: 10/04/2013 22:13    Microbiology: No results found for this or any previous visit (from the past 240 hour(s)).   Labs: Basic Metabolic Panel:  Recent Labs Lab 10/04/13 2050 10/05/13 0525  NA 139 143  K 3.3* 3.9  CL 99 105  CO2 26 29  GLUCOSE 215* 106*  BUN 18 29*  CREATININE 1.14* 1.07  CALCIUM 9.1 8.3*   Liver Function Tests:  Recent Labs Lab 10/05/13 0525  AST 18  ALT 13  ALKPHOS 45  BILITOT 0.2*  PROT 5.7*  ALBUMIN 2.9*   No results found for this basename: LIPASE, AMYLASE,  in the last 168 hours No results found for this basename:  AMMONIA,  in the last 168 hours CBC:  Recent Labs Lab 10/04/13 2050 10/05/13 0525  WBC 7.2 8.2  NEUTROABS  --  5.6  HGB 11.6* 9.1*  HCT 34.4* 27.6*  MCV 94.5 93.9  PLT 181 155   Cardiac Enzymes: No results found for this basename: CKTOTAL, CKMB, CKMBINDEX, TROPONINI,  in the last 168 hours BNP: BNP (last 3 results) No results found for this basename: PROBNP,  in the last 8760 hours CBG: No results found for this basename: GLUCAP,  in the last 168 hours     Signed:  Cornelius Marullo A  [facility] 10/05/2013, 2:51 PM

## 2013-10-05 NOTE — Progress Notes (Signed)
Chelsea Fuller [id number] Admitted to 4U98: 10/05/2013 12:12 AM Attending Provider: Eduard Clos, MD    Chelsea Fuller is a 79 y.o. female patient admitted from ED awake, alert  & orientated  X 3,  Full Code, VSS - Blood pressure 157/71, pulse 95, temperature 98.3 F (36.8 C), temperature source Oral, resp. rate 20, height 5\' 4"  (1.626 m), weight 63.594 kg (140 lb 3.2 oz), SpO2 96.00%., no c/o shortness of breath, no c/o chest pain, no distress noted. Tele # [phone] placed and pt is currently running: A-fib   IV site WDL:  with a transparent dsg that's clean dry and intact.  Allergies:  No Known Allergies   Past Medical History  Diagnosis Date  . Hypertension   . CVA (cerebral infarction)   . Hypothyroidism     History:  obtained from patient  Pt orientation to unit, room and routine. Information packet given to patient and safety video needs to be viewed.  Admission INP armband ID verified with patient, and in place. SR up x 2, fall risk assessment complete with Patient and family verbalizing understanding of risks associated with falls. Pt verbalizes an understanding of how to use the call bell and to call for help before getting out of bed.  Skin, facial swelling and bruising. Otherwise skin clean dry and intact.  Will cont to monitor and assist as needed.  Elisha Ponder, RN 10/05/2013 12:12 AM

## 2013-10-05 NOTE — Care Management Note (Signed)
    Page 1 of 1   10/05/2013     2:26:44 PM   CARE MANAGEMENT NOTE 10/05/2013  Patient:  Chelsea Fuller, Chelsea Fuller   Account Number:  [id number]  Date Initiated:  10/05/2013  Documentation initiated by:  Letha Cape  Subjective/Objective Assessment:   dx epistaxis  admit- lives alone,but one of her sisters is always there with her.  Patient has rolling walker at home.     Action/Plan:   pt evla- rec hhpt   Anticipated DC Date:  10/05/2013   Anticipated DC Plan:  HOME W HOME HEALTH SERVICES      DC Planning Services  CM consult      [facility] Choice  HOME HEALTH   Choice offered to / List presented to:  C-1 Patient        HH arranged  HH-2 PT      [place] agency  [facility]   Status of service:  Completed, signed off Medicare Important Message given?   (If response is "NO", the following Medicare IM given date fields will be blank) Date Medicare IM given:   Date Additional Medicare IM given:    Discharge Disposition:  HOME W HOME HEALTH SERVICES  Per UR Regulation:  Reviewed for med. necessity/level of care/duration of stay  If discussed at Long Length of Stay Meetings, dates discussed:    Comments:  10/05/13 14:23 Letha Cape RN, BSN [phone] patient lives alone, but one of her sister's is always with her,  per phsycial therapy recs hhpt.  Patient has had HH services with [facility] in the past and would like to work with [facility] again, patient states she does not need a HHRN, referral made to [facility] for [facility] , Debbie notified. Soc will begin 24-48 hrs post discharge.

## 2013-10-05 NOTE — Progress Notes (Signed)
Karl Pock Musto to be D/C'd Home per MD order.  Discussed with the patient and all questions fully answered.    Medication List    STOP taking these medications       clopidogrel 75 MG tablet  Commonly known as:  PLAVIX      TAKE these medications       acetaminophen 500 MG tablet  Commonly known as:  TYLENOL  Take 500 mg by mouth every 6 (six) hours as needed for mild pain.     amoxicillin-clavulanate 875-125 MG per tablet  Commonly known as:  AUGMENTIN  Take 1 tablet by mouth 2 (two) times daily.     aspirin EC 81 MG tablet  Take 81 mg by mouth daily.     diltiazem 120 MG 24 hr capsule  Commonly known as:  TIAZAC  Take 120 mg by mouth daily.     levothyroxine 88 MCG tablet  Commonly known as:  SYNTHROID, LEVOTHROID  Take 88 mcg by mouth daily before breakfast.     valsartan-hydrochlorothiazide 160-12.5 MG per tablet  Commonly known as:  DIOVAN-HCT  Take 1 tablet by mouth daily.        VVS, Skin clean, dry and intact without evidence of skin break down, no evidence of skin tears noted. IV catheter discontinued intact. Site without signs and symptoms of complications. Dressing and pressure applied.  An After Visit Summary was printed and given to the patient.  D/c education completed with patient/family including follow up instructions, medication list, d/c activities limitations if indicated, with other d/c instructions as indicated by MD - patient able to verbalize understanding, all questions fully answered.   Patient instructed to return to ED, call 911, or call MD for any changes in condition.   Patient escorted via WC, and D/C home via private auto.  Kennyth Arnold D 10/05/2013 3:52 PM

## 2013-10-05 NOTE — Consult Note (Signed)
    PMH:  Past Medical History  Diagnosis Date  . Hypertension   . CVA (cerebral infarction)   . Hypothyroidism     PSH:  Past Surgical History  Procedure Laterality Date  . Cholecystectomy        D[place], [place] [phone] 06/20/26 Eduard Clos, MD  Reason for Consult: nasal fracture  HPI: 79yo who fell, sustained leftward displaced nasal bone fracture with right epistaxis that the ER controlled with right sided nasal packing. ENT consulted for nasal fracture  Allergies: No Known Allergies  ROS: facial pain and swelling, otherwise negative x 12 systems except per HPI  PMH:  Past Medical History  Diagnosis Date  . Hypertension   . CVA (cerebral infarction)   . Hypothyroidism     FH:  Family History  Problem Relation Age of Onset  . Diabetes Mellitus II Son     SH:  History   Social History  . Marital Status: Widowed    Spouse Name: N/A    Number of Children: N/A  . Years of Education: N/A   Occupational History  . Not on file.   Social History Main Topics  . Smoking status: Never Smoker   . Smokeless tobacco: Not on file  . Alcohol Use: No  . Drug Use: No  . Sexual Activity: Not Currently   Other Topics Concern  . Not on file   Social History Narrative  . No narrative on file    PSH:  Past Surgical History  Procedure Laterality Date  . Cholecystectomy      Physical  Exam: CN 2-12 grossly intact and symmetric. EAC/TMs normal BL. Oral cavity, lips, gums, ororpharynx normal with no masses or lesions. Skin warm and dry. Nasal cavity hemostatic with a right nasal pack in place and secure. External nose with bruising and deviation of the dorsum to the left with ecchymosis.  EOMI, PERRLA. Neck supple.  Procedure Note:  21315, closed reduction of nasal fracture. After obtaining informed verbal consent and discussing the risks including bleeding, infection, residual cosmetic deformity, the nasal dorsum was infiltrated with 5mL of 2%  lidocaine with epinephrine. After this had taken effect, the backside of an Adson forceps was used intranasally to reduce the septum back to midline, and then firm left to right pressure with the surgeon's hands was used to reduce the nasal fracture back to midline. Excellent reduction was noted with a midline dorsum. The patient tolerated the procedure well with no immediate complications.  A/P: s/p reduction of nasal fracture. This will heal in 4-6 weeks. Her nasal pack should be left in place for 5-7 days, she can follow up with [facility] ENT in the office after discharge for pack removal. Should be on an antibiotic while the pack is in place.   Melvenia Beam 10/05/2013 1:06 AM

## 2015-05-31 ENCOUNTER — Other Ambulatory Visit: Payer: Self-pay | Admitting: Internal Medicine

## 2015-05-31 ENCOUNTER — Ambulatory Visit
Admission: RE | Admit: 2015-05-31 | Discharge: 2015-05-31 | Disposition: A | Discharge status: Home / Self Care | Payer: Medicare HMO | Source: Ambulatory Visit | Attending: Internal Medicine | Admitting: Internal Medicine

## 2015-05-31 DIAGNOSIS — R0602 Shortness of breath: Secondary | ICD-10-CM

## 2015-05-31 DIAGNOSIS — I4891 Unspecified atrial fibrillation: Secondary | ICD-10-CM

## 2015-06-02 ENCOUNTER — Other Ambulatory Visit (HOSPITAL_COMMUNITY): Payer: Self-pay | Admitting: Internal Medicine

## 2015-06-02 DIAGNOSIS — I4891 Unspecified atrial fibrillation: Secondary | ICD-10-CM

## 2015-06-03 ENCOUNTER — Ambulatory Visit (HOSPITAL_COMMUNITY)
Admission: RE | Admit: 2015-06-03 | Discharge: 2015-06-03 | Disposition: A | Discharge status: Home / Self Care | Payer: Medicare PPO | Source: Ambulatory Visit | Attending: Internal Medicine | Admitting: Internal Medicine

## 2015-06-03 DIAGNOSIS — I371 Nonrheumatic pulmonary valve insufficiency: Secondary | ICD-10-CM | POA: Insufficient documentation

## 2015-06-03 DIAGNOSIS — I351 Nonrheumatic aortic (valve) insufficiency: Secondary | ICD-10-CM | POA: Diagnosis not present

## 2015-06-03 DIAGNOSIS — I4891 Unspecified atrial fibrillation: Secondary | ICD-10-CM | POA: Diagnosis not present

## 2015-06-03 DIAGNOSIS — I358 Other nonrheumatic aortic valve disorders: Secondary | ICD-10-CM | POA: Insufficient documentation

## 2015-06-03 DIAGNOSIS — I10 Essential (primary) hypertension: Secondary | ICD-10-CM | POA: Diagnosis not present

## 2015-06-03 DIAGNOSIS — I517 Cardiomegaly: Secondary | ICD-10-CM | POA: Insufficient documentation

## 2015-06-03 NOTE — Progress Notes (Signed)
  Echocardiogram 2D Echocardiogram has been performed.  Chelsea Fuller 06/03/2015, 11:24 AM

## 2015-06-09 DIAGNOSIS — I10 Essential (primary) hypertension: Secondary | ICD-10-CM | POA: Diagnosis not present

## 2015-06-09 DIAGNOSIS — I4891 Unspecified atrial fibrillation: Secondary | ICD-10-CM | POA: Diagnosis not present

## 2015-07-07 DIAGNOSIS — I4891 Unspecified atrial fibrillation: Secondary | ICD-10-CM | POA: Diagnosis not present

## 2015-07-07 DIAGNOSIS — Z23 Encounter for immunization: Secondary | ICD-10-CM | POA: Diagnosis not present

## 2015-08-15 DIAGNOSIS — I4891 Unspecified atrial fibrillation: Secondary | ICD-10-CM | POA: Diagnosis not present

## 2015-08-15 DIAGNOSIS — J209 Acute bronchitis, unspecified: Secondary | ICD-10-CM | POA: Diagnosis not present

## 2015-08-24 DIAGNOSIS — J209 Acute bronchitis, unspecified: Secondary | ICD-10-CM | POA: Diagnosis not present

## 2015-08-24 DIAGNOSIS — I10 Essential (primary) hypertension: Secondary | ICD-10-CM | POA: Diagnosis not present

## 2016-04-17 ENCOUNTER — Other Ambulatory Visit: Payer: Self-pay | Admitting: Internal Medicine

## 2016-04-17 ENCOUNTER — Ambulatory Visit
Admission: RE | Admit: 2016-04-17 | Discharge: 2016-04-17 | Disposition: A | Discharge status: Home / Self Care | Payer: Medicare Other | Source: Ambulatory Visit | Attending: Internal Medicine | Admitting: Internal Medicine

## 2016-04-17 DIAGNOSIS — R52 Pain, unspecified: Secondary | ICD-10-CM

## 2016-08-19 ENCOUNTER — Encounter (HOSPITAL_COMMUNITY)
Admission: EM | Disposition: A | Discharge status: Home Health | Payer: Self-pay | Source: Home / Self Care | Attending: Interventional Cardiology

## 2016-08-19 ENCOUNTER — Emergency Department (HOSPITAL_COMMUNITY): Payer: Medicare Other

## 2016-08-19 ENCOUNTER — Inpatient Hospital Stay (HOSPITAL_COMMUNITY)
Admission: EM | Admit: 2016-08-19 | Discharge: 2016-08-20 | DRG: 247 | Disposition: A | Discharge status: Home Health | Payer: Medicare Other | Attending: Interventional Cardiology | Admitting: Interventional Cardiology

## 2016-08-19 ENCOUNTER — Encounter (HOSPITAL_COMMUNITY): Payer: Self-pay | Admitting: Emergency Medicine

## 2016-08-19 DIAGNOSIS — Z79899 Other long term (current) drug therapy: Secondary | ICD-10-CM | POA: Diagnosis not present

## 2016-08-19 DIAGNOSIS — Z955 Presence of coronary angioplasty implant and graft: Secondary | ICD-10-CM

## 2016-08-19 DIAGNOSIS — N183 Chronic kidney disease, stage 3 (moderate): Secondary | ICD-10-CM | POA: Diagnosis present

## 2016-08-19 DIAGNOSIS — I480 Paroxysmal atrial fibrillation: Secondary | ICD-10-CM

## 2016-08-19 DIAGNOSIS — I2102 ST elevation (STEMI) myocardial infarction involving left anterior descending coronary artery: Secondary | ICD-10-CM

## 2016-08-19 DIAGNOSIS — I2109 ST elevation (STEMI) myocardial infarction involving other coronary artery of anterior wall: Secondary | ICD-10-CM | POA: Diagnosis present

## 2016-08-19 DIAGNOSIS — Z7982 Long term (current) use of aspirin: Secondary | ICD-10-CM | POA: Diagnosis not present

## 2016-08-19 DIAGNOSIS — I482 Chronic atrial fibrillation, unspecified: Secondary | ICD-10-CM | POA: Diagnosis present

## 2016-08-19 DIAGNOSIS — I129 Hypertensive chronic kidney disease with stage 1 through stage 4 chronic kidney disease, or unspecified chronic kidney disease: Secondary | ICD-10-CM | POA: Diagnosis present

## 2016-08-19 DIAGNOSIS — E8770 Fluid overload, unspecified: Secondary | ICD-10-CM | POA: Diagnosis not present

## 2016-08-19 DIAGNOSIS — I255 Ischemic cardiomyopathy: Secondary | ICD-10-CM | POA: Diagnosis present

## 2016-08-19 DIAGNOSIS — I110 Hypertensive heart disease with heart failure: Secondary | ICD-10-CM | POA: Diagnosis not present

## 2016-08-19 DIAGNOSIS — E7800 Pure hypercholesterolemia, unspecified: Secondary | ICD-10-CM | POA: Diagnosis not present

## 2016-08-19 DIAGNOSIS — I10 Essential (primary) hypertension: Secondary | ICD-10-CM | POA: Diagnosis present

## 2016-08-19 DIAGNOSIS — I252 Old myocardial infarction: Secondary | ICD-10-CM | POA: Diagnosis present

## 2016-08-19 DIAGNOSIS — Z8673 Personal history of transient ischemic attack (TIA), and cerebral infarction without residual deficits: Secondary | ICD-10-CM | POA: Diagnosis not present

## 2016-08-19 DIAGNOSIS — E039 Hypothyroidism, unspecified: Secondary | ICD-10-CM | POA: Diagnosis present

## 2016-08-19 DIAGNOSIS — I2510 Atherosclerotic heart disease of native coronary artery without angina pectoris: Secondary | ICD-10-CM | POA: Diagnosis present

## 2016-08-19 DIAGNOSIS — I213 ST elevation (STEMI) myocardial infarction of unspecified site: Secondary | ICD-10-CM | POA: Diagnosis not present

## 2016-08-19 HISTORY — PX: CARDIAC CATHETERIZATION: SHX172

## 2016-08-19 HISTORY — DX: Atherosclerotic heart disease of native coronary artery without angina pectoris: I25.10

## 2016-08-19 LAB — LIPID PANEL
CHOL/HDL RATIO: 3 ratio
CHOL/HDL RATIO: 2.9 ratio
Cholesterol: 188 mg/dL (ref 0–200)
Cholesterol: 187 mg/dL (ref 0–200)
HDL: 63 mg/dL (ref >40)
HDL: 64 mg/dL (ref >40)
LDL CALC: 113 mg/dL — AB (ref 0–99)
LDL Cholesterol: 100 mg/dL — ABNORMAL HIGH (ref 0–99)
TRIGLYCERIDES: 52 mg/dL (ref <150)
Triglycerides: 123 mg/dL (ref <150)
VLDL: 25 mg/dL (ref 0–40)
VLDL: 10 mg/dL (ref 0–40)

## 2016-08-19 LAB — TROPONIN I
TROPONIN I: 51.07 ng/mL — AB (ref <0.03)
TROPONIN I: 46.87 ng/mL — AB (ref <0.03)
Troponin I: 12.92 ng/mL (ref <0.03)
Troponin I: >65 ng/mL (ref <0.03)

## 2016-08-19 LAB — DIFFERENTIAL
BASOS ABS: 0 10*3/uL (ref 0.0–0.1)
Basophils Relative: 1 %
Eosinophils Absolute: 0.4 10*3/uL (ref 0.0–0.7)
Eosinophils Relative: 4 %
LYMPHS ABS: 3 10*3/uL (ref 0.7–4.0)
LYMPHS PCT: 34 %
Monocytes Absolute: 0.7 10*3/uL (ref 0.1–1.0)
Monocytes Relative: 8 %
NEUTROS PCT: 53 %
Neutro Abs: 4.7 10*3/uL (ref 1.7–7.7)

## 2016-08-19 LAB — TSH: TSH: 3.835 u[IU]/mL (ref 0.350–4.500)

## 2016-08-19 LAB — COMPREHENSIVE METABOLIC PANEL
ALBUMIN: 3.5 g/dL (ref 3.5–5.0)
ALT: 16 U/L (ref 14–54)
AST: 23 U/L (ref 15–41)
Alkaline Phosphatase: 53 U/L (ref 38–126)
Anion gap: 10 (ref 5–15)
BUN: 27 mg/dL — AB (ref 6–20)
CHLORIDE: 102 mmol/L (ref 101–111)
CO2: 27 mmol/L (ref 22–32)
Calcium: 9 mg/dL (ref 8.9–10.3)
Creatinine, Ser: 1.54 mg/dL — ABNORMAL HIGH (ref 0.44–1.00)
GFR calc Af Amer: 33 mL/min — ABNORMAL LOW (ref >60)
GFR, EST NON AFRICAN AMERICAN: 29 mL/min — AB (ref >60)
GLUCOSE: 194 mg/dL — AB (ref 65–99)
POTASSIUM: 3.7 mmol/L (ref 3.5–5.1)
Sodium: 139 mmol/L (ref 135–145)
Total Bilirubin: 0.5 mg/dL (ref 0.3–1.2)
Total Protein: 6.6 g/dL (ref 6.5–8.1)

## 2016-08-19 LAB — BASIC METABOLIC PANEL
Anion gap: 10 (ref 5–15)
BUN: 25 mg/dL — AB (ref 6–20)
CHLORIDE: 102 mmol/L (ref 101–111)
CO2: 24 mmol/L (ref 22–32)
CREATININE: 1.42 mg/dL — AB (ref 0.44–1.00)
Calcium: 8.8 mg/dL — ABNORMAL LOW (ref 8.9–10.3)
GFR calc Af Amer: 37 mL/min — ABNORMAL LOW (ref >60)
GFR, EST NON AFRICAN AMERICAN: 32 mL/min — AB (ref >60)
GLUCOSE: 197 mg/dL — AB (ref 65–99)
POTASSIUM: 4 mmol/L (ref 3.5–5.1)
SODIUM: 136 mmol/L (ref 135–145)

## 2016-08-19 LAB — CBC
HCT: 36.2 % (ref 36.0–46.0)
HEMATOCRIT: 38 % (ref 36.0–46.0)
HEMOGLOBIN: 11.7 g/dL — AB (ref 12.0–15.0)
Hemoglobin: 12 g/dL (ref 12.0–15.0)
MCH: 30.8 pg (ref 26.0–34.0)
MCH: 30.2 pg (ref 26.0–34.0)
MCHC: 32.3 g/dL (ref 30.0–36.0)
MCHC: 31.6 g/dL (ref 30.0–36.0)
MCV: 95.3 fL (ref 78.0–100.0)
MCV: 95.5 fL (ref 78.0–100.0)
Platelets: 169 10*3/uL (ref 150–400)
Platelets: 172 10*3/uL (ref 150–400)
RBC: 3.8 MIL/uL — AB (ref 3.87–5.11)
RBC: 3.98 MIL/uL (ref 3.87–5.11)
RDW: 13.4 % (ref 11.5–15.5)
RDW: 13.3 % (ref 11.5–15.5)
WBC: 8.8 10*3/uL (ref 4.0–10.5)
WBC: 11.5 10*3/uL — AB (ref 4.0–10.5)

## 2016-08-19 LAB — PROTIME-INR
INR: 1.04
INR: 1.34
Prothrombin Time: 13.7 seconds (ref 11.4–15.2)
Prothrombin Time: 16.7 seconds — ABNORMAL HIGH (ref 11.4–15.2)

## 2016-08-19 LAB — MRSA PCR SCREENING: MRSA by PCR: NEGATIVE

## 2016-08-19 LAB — APTT: APTT: 29 s (ref 24–36)

## 2016-08-19 LAB — MAGNESIUM: Magnesium: 1.7 mg/dL (ref 1.7–2.4)

## 2016-08-19 SURGERY — LEFT HEART CATH AND CORONARY ANGIOGRAPHY
Anesthesia: LOCAL

## 2016-08-19 MED ORDER — FENTANYL CITRATE (PF) 100 MCG/2ML IJ SOLN
INTRAMUSCULAR | Status: AC
  Filled 2016-08-19: qty 2

## 2016-08-19 MED ORDER — OXYCODONE-ACETAMINOPHEN 5-325 MG PO TABS
1.0000 | ORAL_TABLET | ORAL | Status: DC | PRN
  Administered 2016-08-19: 1 via ORAL
  Filled 2016-08-19: qty 1

## 2016-08-19 MED ORDER — ENOXAPARIN SODIUM 30 MG/0.3ML ~~LOC~~ SOLN
30.0000 mg | SUBCUTANEOUS | Status: DC
  Administered 2016-08-19: 30 mg via SUBCUTANEOUS
  Filled 2016-08-19: qty 0.3

## 2016-08-19 MED ORDER — VERAPAMIL HCL 2.5 MG/ML IV SOLN
INTRAVENOUS | Status: AC
  Filled 2016-08-19: qty 2

## 2016-08-19 MED ORDER — HEPARIN (PORCINE) IN NACL 2-0.9 UNIT/ML-% IJ SOLN
INTRAMUSCULAR | Status: AC
  Filled 2016-08-19: qty 1000

## 2016-08-19 MED ORDER — CLOPIDOGREL BISULFATE 300 MG PO TABS
ORAL_TABLET | ORAL | Status: DC | PRN
  Administered 2016-08-19: 150 mg via ORAL

## 2016-08-19 MED ORDER — HEPARIN (PORCINE) IN NACL 2-0.9 UNIT/ML-% IJ SOLN
INTRAMUSCULAR | Status: DC | PRN
  Administered 2016-08-19: 1000 mL

## 2016-08-19 MED ORDER — VERAPAMIL HCL 2.5 MG/ML IV SOLN
INTRAVENOUS | Status: DC | PRN
  Administered 2016-08-19: 10 mL via INTRA_ARTERIAL

## 2016-08-19 MED ORDER — ATORVASTATIN CALCIUM 40 MG PO TABS: 40.0000 mg | ORAL_TABLET | Freq: Every day | ORAL | Status: DC

## 2016-08-19 MED ORDER — FENTANYL CITRATE (PF) 100 MCG/2ML IJ SOLN
INTRAMUSCULAR | Status: DC | PRN
  Administered 2016-08-19 (×2): 50 ug via INTRAVENOUS

## 2016-08-19 MED ORDER — SODIUM CHLORIDE 0.9% FLUSH: 3.0000 mL | INTRAVENOUS | Status: DC | PRN

## 2016-08-19 MED ORDER — IOPAMIDOL (ISOVUE-370) INJECTION 76%
INTRAVENOUS | Status: AC
  Filled 2016-08-19: qty 100

## 2016-08-19 MED ORDER — HEPARIN SODIUM (PORCINE) 1000 UNIT/ML IJ SOLN
INTRAMUSCULAR | Status: AC
Start: 2016-08-19 — End: 2016-08-19
  Filled 2016-08-19: qty 1

## 2016-08-19 MED ORDER — ONDANSETRON HCL 4 MG/2ML IJ SOLN: 4.0000 mg | Freq: Four times a day (QID) | INTRAMUSCULAR | Status: DC | PRN

## 2016-08-19 MED ORDER — SODIUM CHLORIDE 0.9 % IV SOLN: 250.0000 mL | INTRAVENOUS | Status: DC | PRN

## 2016-08-19 MED ORDER — ASPIRIN EC 81 MG PO TBEC
81.0000 mg | DELAYED_RELEASE_TABLET | Freq: Every day | ORAL | Status: DC
  Administered 2016-08-19 – 2016-08-20 (×2): 81 mg via ORAL
  Filled 2016-08-19 (×2): qty 1

## 2016-08-19 MED ORDER — ACETAMINOPHEN 325 MG PO TABS: 650.0000 mg | ORAL_TABLET | ORAL | Status: DC | PRN

## 2016-08-19 MED ORDER — ACETAMINOPHEN 325 MG PO TABS
650.0000 mg | ORAL_TABLET | ORAL | Status: DC | PRN
  Administered 2016-08-19: 650 mg via ORAL
  Filled 2016-08-19: qty 2

## 2016-08-19 MED ORDER — ONDANSETRON HCL 4 MG/2ML IJ SOLN
INTRAMUSCULAR | Status: AC
  Filled 2016-08-19: qty 2

## 2016-08-19 MED ORDER — ONDANSETRON HCL 4 MG/2ML IJ SOLN
4.0000 mg | Freq: Once | INTRAMUSCULAR | Status: AC
  Administered 2016-08-19: 4 mg via INTRAVENOUS
  Filled 2016-08-19: qty 2

## 2016-08-19 MED ORDER — LEVOTHYROXINE SODIUM 88 MCG PO TABS
88.0000 ug | ORAL_TABLET | Freq: Every day | ORAL | Status: DC
  Administered 2016-08-19 – 2016-08-20 (×2): 88 ug via ORAL
  Filled 2016-08-19 (×2): qty 1

## 2016-08-19 MED ORDER — CARVEDILOL 6.25 MG PO TABS
6.2500 mg | ORAL_TABLET | Freq: Two times a day (BID) | ORAL | Status: DC
  Administered 2016-08-19 – 2016-08-20 (×4): 6.25 mg via ORAL
  Filled 2016-08-19 (×4): qty 1

## 2016-08-19 MED ORDER — LIDOCAINE HCL (PF) 1 % IJ SOLN
INTRAMUSCULAR | Status: AC
  Filled 2016-08-19: qty 30

## 2016-08-19 MED ORDER — CLOPIDOGREL BISULFATE 75 MG PO TABS
ORAL_TABLET | ORAL | Status: AC
  Filled 2016-08-19: qty 2

## 2016-08-19 MED ORDER — LIDOCAINE HCL (PF) 1 % IJ SOLN
INTRAMUSCULAR | Status: DC | PRN
  Administered 2016-08-19: 2 mL

## 2016-08-19 MED ORDER — HEPARIN SODIUM (PORCINE) 1000 UNIT/ML IJ SOLN
INTRAMUSCULAR | Status: AC
  Filled 2016-08-19: qty 1

## 2016-08-19 MED ORDER — SODIUM CHLORIDE 0.9 % IV SOLN: 10.0000 mL/h | INTRAVENOUS | Status: DC

## 2016-08-19 MED ORDER — ATORVASTATIN CALCIUM 80 MG PO TABS
80.0000 mg | ORAL_TABLET | Freq: Every day | ORAL | Status: DC
  Administered 2016-08-19 – 2016-08-20 (×2): 80 mg via ORAL
  Filled 2016-08-19 (×2): qty 1

## 2016-08-19 MED ORDER — SODIUM CHLORIDE 0.9 % WEIGHT BASED INFUSION
1.0000 mL/kg/h | INTRAVENOUS | Status: AC
  Administered 2016-08-19: 1 mL/kg/h via INTRAVENOUS

## 2016-08-19 MED ORDER — NITROGLYCERIN 1 MG/10 ML FOR IR/CATH LAB
INTRA_ARTERIAL | Status: DC | PRN
  Administered 2016-08-19 (×2): 200 ug via INTRACORONARY

## 2016-08-19 MED ORDER — ASPIRIN 81 MG PO CHEW: 81.0000 mg | CHEWABLE_TABLET | Freq: Every day | ORAL | Status: DC

## 2016-08-19 MED ORDER — ASPIRIN EC 81 MG PO TBEC: 81.0000 mg | DELAYED_RELEASE_TABLET | Freq: Every day | ORAL | Status: DC

## 2016-08-19 MED ORDER — IOPAMIDOL (ISOVUE-370) INJECTION 76%
INTRAVENOUS | Status: AC
  Filled 2016-08-19: qty 125

## 2016-08-19 MED ORDER — HEPARIN SODIUM (PORCINE) 1000 UNIT/ML IJ SOLN
INTRAMUSCULAR | Status: DC | PRN
  Administered 2016-08-19: 3000 [IU] via INTRAVENOUS
  Administered 2016-08-19 (×2): 2000 [IU] via INTRAVENOUS
  Administered 2016-08-19: 4000 [IU] via INTRAVENOUS

## 2016-08-19 MED ORDER — HEPARIN SODIUM (PORCINE) 5000 UNIT/ML IJ SOLN: 5000.0000 [IU] | Freq: Three times a day (TID) | INTRAMUSCULAR | Status: DC

## 2016-08-19 MED ORDER — IOPAMIDOL (ISOVUE-370) INJECTION 76%
INTRAVENOUS | Status: DC | PRN
  Administered 2016-08-19: 300 mL via INTRA_ARTERIAL

## 2016-08-19 MED ORDER — ASPIRIN 81 MG PO CHEW: 324.0000 mg | CHEWABLE_TABLET | ORAL | Status: DC

## 2016-08-19 MED ORDER — ONDANSETRON HCL 4 MG/2ML IJ SOLN
INTRAMUSCULAR | Status: DC | PRN
  Administered 2016-08-19: 4 mg via INTRAVENOUS

## 2016-08-19 MED ORDER — ASPIRIN 300 MG RE SUPP: 300.0000 mg | RECTAL | Status: DC

## 2016-08-19 MED ORDER — METOPROLOL TARTRATE 25 MG PO TABS: 25.0000 mg | ORAL_TABLET | Freq: Two times a day (BID) | ORAL | Status: DC

## 2016-08-19 MED ORDER — NITROGLYCERIN 0.4 MG SL SUBL: 0.4000 mg | SUBLINGUAL_TABLET | SUBLINGUAL | Status: DC | PRN

## 2016-08-19 MED ORDER — NITROGLYCERIN IN D5W 200-5 MCG/ML-% IV SOLN
10.0000 ug/min | INTRAVENOUS | Status: DC
  Administered 2016-08-19: 10 ug/min via INTRAVENOUS
  Administered 2016-08-19: 5 ug/min via INTRAVENOUS
  Filled 2016-08-19: qty 250

## 2016-08-19 MED ORDER — CLOPIDOGREL BISULFATE 75 MG PO TABS
75.0000 mg | ORAL_TABLET | Freq: Every day | ORAL | Status: DC
  Administered 2016-08-19 – 2016-08-20 (×2): 75 mg via ORAL
  Filled 2016-08-19 (×2): qty 1

## 2016-08-19 MED ORDER — HEPARIN SODIUM (PORCINE) 5000 UNIT/ML IJ SOLN
4000.0000 [IU] | INTRAMUSCULAR | Status: AC
  Administered 2016-08-19: 4000 [IU] via INTRAVENOUS

## 2016-08-19 MED ORDER — NITROGLYCERIN 1 MG/10 ML FOR IR/CATH LAB
INTRA_ARTERIAL | Status: AC
  Filled 2016-08-19: qty 10

## 2016-08-19 MED ORDER — SODIUM CHLORIDE 0.9% FLUSH
3.0000 mL | Freq: Two times a day (BID) | INTRAVENOUS | Status: DC
  Administered 2016-08-19 – 2016-08-20 (×3): 3 mL via INTRAVENOUS

## 2016-08-19 SURGICAL SUPPLY — 22 items
BALLN EMERGE MR 2.0X12 (BALLOONS) ×2
BALLN EUPHORA RX 2.5X15 (BALLOONS) ×2
BALLN ~~LOC~~ EMERGE MR 2.5X15 (BALLOONS) ×2
BALLOON EMERGE MR 2.0X12 (BALLOONS) IMPLANT
BALLOON EUPHORA RX 2.5X15 (BALLOONS) IMPLANT
BALLOON ~~LOC~~ EMERGE MR 2.5X15 (BALLOONS) IMPLANT
CATH INFINITI JR4 5F (CATHETERS) ×1 IMPLANT
CATH VISTA GUIDE 6FR XBLAD3.0 (CATHETERS) ×1 IMPLANT
CATH VISTA GUIDE 6FR XBLAD3.5 (CATHETERS) ×1 IMPLANT
DEVICE RAD COMP TR BAND LRG (VASCULAR PRODUCTS) ×1 IMPLANT
GLIDESHEATH SLEND A-KIT 6F 22G (SHEATH) ×1 IMPLANT
KIT ENCORE 26 ADVANTAGE (KITS) ×1 IMPLANT
KIT HEART LEFT (KITS) ×2 IMPLANT
PACK CARDIAC CATHETERIZATION (CUSTOM PROCEDURE TRAY) ×2 IMPLANT
STENT SYNERGY DES 2.25X32 (Permanent Stent) ×1 IMPLANT
STENT SYNERGY DES 2.5X24 (Permanent Stent) ×1 IMPLANT
TRANSDUCER W/STOPCOCK (MISCELLANEOUS) ×2 IMPLANT
TUBING CIL FLEX 10 FLL-RA (TUBING) ×3 IMPLANT
WIRE ASAHI PROWATER 180CM (WIRE) ×1 IMPLANT
WIRE HI TORQ VERSACORE-J 145CM (WIRE) ×1 IMPLANT
WIRE MARVEL STR TIP 190CM (WIRE) ×1 IMPLANT
WIRE SAFE-T 1.5MM-J .035X260CM (WIRE) ×1 IMPLANT

## 2016-08-19 NOTE — Progress Notes (Signed)
   08/19/16 0048  Clinical Encounter Type  Visited With Patient and family together  Visit Type ED  Referral From Other (Comment) Valinda Party(Stemi)  Spiritual Encounters  Spiritual Needs Emotional  Stress Factors  Patient Stress Factors Health changes  Family Stress Factors Family relationships  Pt appeared calm. Family in waiting area. Cardiologist available for family and escorted to trauma C. Family followed Pt and medical staff to cath lab and family [facility].

## 2016-08-19 NOTE — ED Notes (Signed)
Cardiology at the bedside.

## 2016-08-19 NOTE — H&P (Addendum)
Patient ID: Chelsea NapKathleen L Dinse MRN: [id number], DOB/AGE: 80/02/1924   Admit date: 08/19/2016  Primary Physician: Enrique SackGREEN, EDWIN JAY, MD Primary Cardiologist: N/A  HPI  Chelsea Fuller is a 82 y.o. female, with a medical history of hypertension, hypothyroidism, atrial fibrillation and stroke (2010) who presented to the ED as a CODE STEMI. She started complaining of chest pain and shortness of breath that began around 11 Pm the evening of 10/28. This started as an 8/10, but was 3/10 by arrival. Although the "pain" was no longer as severe, she continued to complain of tightness and pressure. She also endorsed nausea. Her family tells me that she has been suffering from worsening dyspnea and chest pressure on exertion for 3-4 weeks. Her PCP recently prescribed metoprolol, which improved symptoms. On arrival, saturations were in low 90s on 2L . Bp 142/107.   Per EMS pt took 1 nitro treatment at home and was given 2 Nitro treatments and aspirin, en route. She denies history of Mi, CKD, active bleeding. She is on Plavix, but not anticoagulation for her history of atrial fibrillation with stroke.   Problem List  Past Medical History:  Diagnosis Date  . Coronary artery disease   . CVA (cerebral infarction)   . Hypertension   . Hypothyroidism     Past Surgical History:  Procedure Laterality Date  . CHOLECYSTECTOMY      Allergies  No Known Allergies  Home Medications  Prior to Admission medications   Medication Sig Start Date End Date Taking? Authorizing Provider  acetaminophen (TYLENOL) 500 MG tablet Take 500 mg by mouth every 6 (six) hours as needed for mild pain.    Historical Provider, MD  amoxicillin-clavulanate (AUGMENTIN) 875-125 MG per tablet Take 1 tablet by mouth 2 (two) times daily. 10/05/13   Clydia LlanoMutaz Elmahi, MD  aspirin EC 81 MG tablet Take 81 mg by mouth daily.    Historical Provider, MD  diltiazem (TIAZAC) 120 MG 24 hr capsule Take 120 mg by mouth daily. 09/26/13    Historical Provider, MD  levothyroxine (SYNTHROID, LEVOTHROID) 88 MCG tablet Take 88 mcg by mouth daily before breakfast.    Historical Provider, MD  metoprolol tartrate (LOPRESSOR) 25 MG tablet Take 25 mg by mouth 2 (two) times daily. 07/29/16   Historical Provider, MD    Family History  Family History  Problem Relation Age of Onset  . Diabetes Mellitus II Son     Social History  Social History   Social History  . Marital status: Widowed    Spouse name: N/A  . Number of children: N/A  . Years of education: N/A   Occupational History  . Not on file.   Social History Main Topics  . Smoking status: Never Smoker  . Smokeless tobacco: Never Used  . Alcohol use No  . Drug use: No  . Sexual activity: Not Currently   Other Topics Concern  . Not on file   Social History Narrative   Lives independently   Review of Systems General:  No chills, fever, night sweats or weight changes.  Cardiovascular:  See HPI Dermatological: No rash, lesions/masses Respiratory: No cough, dyspnea Urologic: No hematuria, dysuria Abdominal:   No nausea, vomiting, diarrhea, bright red blood per rectum, melena, or hematemesis Neurologic:  No visual changes, wkns, changes in mental status. All other systems reviewed and are otherwise negative except as noted above.  Physical Exam  Blood pressure (!) 142/107, pulse 72, temperature 97.6 F (36.4 C), temperature source Oral, resp.  rate 15, height 5\' 4"  (1.626 m), weight 63.5 kg (140 lb), SpO2 91 %.  General: Pleasant, NAD Psych: Normal affect. Neuro: Alert and oriented X 3. Moves all extremities spontaneously. HEENT: Normal  Neck: Supple without bruits or JVD. Lungs:  Resp regular and unlabored, CTA. Heart: RRR no s3, s4, or murmurs. Abdomen: Soft, non-tender, non-distended, BS + x 4.  Extremities: No clubbing, cyanosis or edema. DP/PT/Radials 2+ and equal bilaterally.  Labs  Troponin (Point of Care Test) No results for input(s): TROPIPOC  in the last 72 hours. No results for input(s): CKTOTAL, CKMB, TROPONINI in the last 72 hours. Lab Results  Component Value Date   WBC 8.8 08/19/2016   HGB 11.7 (L) 08/19/2016   HCT 36.2 08/19/2016   MCV 95.3 08/19/2016   PLT 169 08/19/2016   No results for input(s): NA, K, CL, CO2, BUN, CREATININE, CALCIUM, PROT, BILITOT, ALKPHOS, ALT, AST, GLUCOSE in the last 168 hours.  Invalid input(s): LABALBU Lab Results  Component Value Date   CHOL 188 08/19/2016   HDL 63 08/19/2016   LDLCALC 100 (H) 08/19/2016   TRIG 123 08/19/2016   No results found for: DDIMER   Radiology/Studies  Dg Chest Port 1 View  Result Date: 08/19/2016 CLINICAL DATA:  Chest pain trauma EXAM: PORTABLE CHEST 1 VIEW COMPARISON:  04/17/2016 FINDINGS: AP portable semi-erect view of the chest. The lungs appear hyperinflated. Mild diffuse interstitial opacities are unchanged, presumed chronic. No acute infiltrate or effusion. Stable moderate cardiomegaly. Atherosclerosis of the aorta. No pneumothorax. IMPRESSION: Moderate cardiomegaly and atherosclerosis. No acute infiltrate or edema. No pneumothorax. Electronically Signed   By: Jasmine PangKim  Fujinaga M.D.   On: 08/19/2016 01:28   ECG Atrial fibrillation with anterior ST elevations, most prominent in V2-V5 with reciprocal ST depressions in inferior leads  Echocardiogram Pending  ASSESSMENT AND PLAN  Anterior STEMI. Patient is now s/p PCI with DES x 3 to LAD. She received Plavix 300 mg during LHC. During procedure, LVEDP was ~25 mmHg. - Aspirin, Plavix - Atorvastatin 40 mg daily - Metoprolol 25 mg BID - Lipids: HDL 63, LDL 100, Total Cholesterol 188. Hemoglobin A1C pending - Transthoracic echocardiogram pending  Atrial Fibrillation, persistent/permanent - Rate controlled as an outpatient on Diltiazem 120 daily and Metoprolol 25 mg bid - Metop continued as above, currently holding dilt - This patients CHA2DS2-VASc Score and unadjusted Ischemic Stroke Rate (% per year) is  equal to 11.2 % stroke rate/year from a score of 7 Above score calculated as 1 point each if present [CHF, HTN, DM, Vascular=MI/PAD/Aortic Plaque, Age if 65-74, or Female] Above score calculated as 2 points each if present [Age > 75, or Stroke/TIA/TE] - Continue DAPT as above. Will need to discuss plans for anticoagulation/antiplatelet regimen before discharge. Patient previously not taking anticoagulation for unclear reasons  Hypothyroidism - THS pending - Continue home Synthroid  Chronic Kidney Disease. - Cr 1.3 on admission (GFR 29) - Post-cath hydration. May require furosemide given LVEDP 25 - Will monitor for contrast induced nephropathy  PT/OT Eval Heart Healthy Diet DVT ppx with subcutaneous heparin Admit to inpatient, ICU status  Signed, PAULEY, ERIC D, MD 08/19/2016, 1:54 AM

## 2016-08-19 NOTE — ED Triage Notes (Signed)
Pt brought to ED by EMS from home for mid sternal CP 8/10 and SOB, pt took 1 nitro sl at home pt EMS arrival with no relief. EMS gave 324 mg ASA and one nitro sl, pt 3/10 cp on ED arrival. Afib on monitor with frequent PVC's.

## 2016-08-19 NOTE — ED Notes (Signed)
Pt ready for cath lab

## 2016-08-19 NOTE — ED Notes (Signed)
PAGED STEMI MD & FELLOW TO POLLINA

## 2016-08-19 NOTE — Progress Notes (Signed)
Paged cardiology on call fellow about pts TR band unable to deflate, pt developing hematoma and continuing to ooze from site. Pauley MD notified and came to assess pt at bedside. MD added 2ccs of air into TR band making it a total of 15 ccs, was able to stop oozing. Pts hand warm to touch, pulses palpable, has feeling, no complaints of pain and is able to move fingers. Pt educated on keeping arm still and not moving it around. RN to deflate 1 cc at a time per protocol. Will continue to monitor closely. Will pass information along to day shift nurse.

## 2016-08-19 NOTE — Progress Notes (Signed)
Dr. Shirlee LatchMclean @ bedside to see right radial TR band, orders to follow protocol. Site level 1 with bruising but area is soft with palpable pulse and cap refill <3, per patient area is not tender. Will continue to monitor closely.

## 2016-08-19 NOTE — ED Provider Notes (Signed)
[facility]-EMERGENCY DEPT Provider Note   CSN: [id number] Arrival date & time:      By signing my name below, I, Chelsea Fuller, attest that this documentation has been prepared under the direction and in the presence of Chelsea Creasehristopher J Jaelynn Pozo, MD  Electronically Signed: Clovis PuAvnee Fuller, ED Scribe. 08/19/16. 1:19 AM.   History   Chief Complaint Chief Complaint  Patient presents with  . Chest Pain  . Shortness of Breath    The history is provided by the patient, the EMS personnel and a relative.   HPI Comments:  Chelsea Fuller is a 82 y.o. female, with a hx of A-Fib and stroke in 2010, who presents to the Emergency Department, via EMS, complaining of SOB which began ~ 11 PM tonight. Pt notes her chest pain started as a "8/10" pressurized pain but states it is improving and currently rates it as a "3/10". She states she is currently feeling nauseous. Per EMS pt took 1 nitro treatment at home and was given 2 Nitro treatments and aspirin, en route, with no relief. She denies a hx of MI or kidney problems. Pt is on Plavix. She is not followed by a cardiologist. Pt states her PCP is Dr. Nila NephewEdwin Fuller. No known drug allergies.   Past Medical History:  Diagnosis Date  . Coronary artery disease   . CVA (cerebral infarction)   . Hypertension   . Hypothyroidism     Patient Active Problem List   Diagnosis Date Noted  . Epistaxis 10/04/2013  . Anemia 10/04/2013  . Nasal fracture 10/04/2013  . Atrial fibrillation (HCC) 10/04/2013  . History of CVA (cerebrovascular accident) 10/04/2013  . HTN (hypertension) 10/04/2013  . Hypothyroidism 10/04/2013    Past Surgical History:  Procedure Laterality Date  . CHOLECYSTECTOMY      OB History    No data available       Home Medications    Prior to Admission medications   Medication Sig Start Date End Date Taking? Authorizing Provider  acetaminophen (TYLENOL) 500 MG tablet Take 500 mg by mouth every 6 (six) hours as needed for mild pain.     Historical Provider, MD  amoxicillin-clavulanate (AUGMENTIN) 875-125 MG per tablet Take 1 tablet by mouth 2 (two) times daily. 10/05/13   Clydia LlanoMutaz Elmahi, MD  aspirin EC 81 MG tablet Take 81 mg by mouth daily.    Historical Provider, MD  diltiazem (TIAZAC) 120 MG 24 hr capsule Take 120 mg by mouth daily. 09/26/13   Historical Provider, MD  levothyroxine (SYNTHROID, LEVOTHROID) 88 MCG tablet Take 88 mcg by mouth daily before breakfast.    Historical Provider, MD  metoprolol tartrate (LOPRESSOR) 25 MG tablet Take 25 mg by mouth 2 (two) times daily. 07/29/16   Historical Provider, MD  valsartan-hydrochlorothiazide (DIOVAN-HCT) 160-12.5 MG per tablet Take 1 tablet by mouth daily. 08/04/13   Historical Provider, MD    Family History Family History  Problem Relation Age of Onset  . Diabetes Mellitus II Son     Social History Social History  Substance Use Topics  . Smoking status: Never Smoker  . Smokeless tobacco: Never Used  . Alcohol use No     Allergies   Review of patient's allergies indicates no known allergies.   Review of Systems Review of Systems  Respiratory: Positive for shortness of breath.   Cardiovascular: Positive for chest pain.  Gastrointestinal: Positive for nausea.  All other systems reviewed and are negative.  Physical Exam Updated Vital Signs BP (!) 142/107  Pulse 72   Temp 97.6 F (36.4 C) (Oral)   Resp 15   Ht 5\' 4"  (1.626 m)   Wt 140 lb (63.5 kg)   SpO2 98%   BMI 24.03 kg/m   Physical Exam  Constitutional: She is oriented to person, place, and time. She appears well-developed and well-nourished. No distress.  HENT:  Head: Normocephalic and atraumatic.  Right Ear: Hearing normal.  Left Ear: Hearing normal.  Nose: Nose normal.  Mouth/Throat: Oropharynx is clear and moist and mucous membranes are normal.  Eyes: Conjunctivae and EOM are normal. Pupils are equal, round, and reactive to light.  Neck: Normal range of motion. Neck supple.    Cardiovascular: Regular rhythm, S1 normal and S2 normal.  Exam reveals no gallop and no friction rub.   No murmur heard. Pulmonary/Chest: Effort normal and breath sounds normal. No respiratory distress. She exhibits no tenderness.  Abdominal: Soft. Normal appearance and bowel sounds are normal. There is no hepatosplenomegaly. There is no tenderness. There is no rebound, no guarding, no tenderness at McBurney's point and negative Murphy's sign. No hernia.  Musculoskeletal: Normal range of motion.  Neurological: She is alert and oriented to person, place, and time. She has normal strength. No cranial nerve deficit or sensory deficit. Coordination normal. GCS eye subscore is 4. GCS verbal subscore is 5. GCS motor subscore is 6.  Skin: Skin is warm, dry and intact. No rash noted. No cyanosis.  Psychiatric: She has a normal mood and affect. Her speech is normal and behavior is normal. Thought content normal.  Nursing note and vitals reviewed.    ED Treatments / Results  DIAGNOSTIC STUDIES:  Oxygen Saturation is 93% on [place], adequate by my interpretation.    COORDINATION OF CARE:  1:14 AM Discussed treatment plan with pt at bedside and pt agreed to plan.  Labs (all labs ordered are listed, but only abnormal results are displayed) Labs Reviewed  CBC - Abnormal; Notable for the following:       Result Value   RBC 3.80 (*)    Hemoglobin 11.7 (*)    All other components within normal limits  DIFFERENTIAL  PROTIME-INR  APTT  COMPREHENSIVE METABOLIC PANEL  TROPONIN I  LIPID PANEL    EKG  EKG Interpretation  Date/Time:  Sunday August 19 2016 01:00:00 EDT Ventricular Rate:  68 PR Interval:    QRS Duration: 92 QT Interval:  440 QTC Calculation: 468 R Axis:   70 Text Interpretation:  Atrial fibrillation Ventricular premature complex Anterior infarct, acute (LAD) Lateral leads are also involved Confirmed by Blinda LeatherwoodPOLLINA  MD, Lorece Keach [phone]([id number]) on 08/19/2016 1:14:29 AM        Radiology Dg Chest Port 1 View  Result Date: 08/19/2016 CLINICAL DATA:  Chest pain trauma EXAM: PORTABLE CHEST 1 VIEW COMPARISON:  04/17/2016 FINDINGS: AP portable semi-erect view of the chest. The lungs appear hyperinflated. Mild diffuse interstitial opacities are unchanged, presumed chronic. No acute infiltrate or effusion. Stable moderate cardiomegaly. Atherosclerosis of the aorta. No pneumothorax. IMPRESSION: Moderate cardiomegaly and atherosclerosis. No acute infiltrate or edema. No pneumothorax. Electronically Signed   By: Jasmine PangKim  Fujinaga M.D.   On: 08/19/2016 01:28    Procedures Procedures (including critical care time)  Medications Ordered in ED Medications  0.9 %  sodium chloride infusion (not administered)  nitroGLYCERIN 50 mg in dextrose 5 % 250 mL (0.2 mg/mL) infusion ( Intravenous MAR Hold 08/19/16 0126)  heparin infusion 2 units/mL in 0.9 % sodium chloride (1,000 mLs Other New Bag/Given 08/19/16  0118)  heparin injection 4,000 Units (4,000 Units Intravenous Given 08/19/16 0109)  ondansetron (ZOFRAN) injection 4 mg (4 mg Intravenous Given 08/19/16 0109)     Initial Impression / Assessment and Plan / ED Course  I have reviewed the triage vital signs and the nursing notes.  Pertinent labs & imaging results that were available during my care of the patient were reviewed by me and considered in my medical decision making (see chart for details).  Clinical Course    Patient presents with complaints of chest pain that again at 11 PM. Patient does not have any known coronary artery disease. She does have chronic atrial fibrillation, only takes Plavix for anticoagulation. Patient has been administered appropriate aspirin therapy prior to arrival. She has also been given nitroglycerin with improvement of her chest pain from 8 out of 10-3 out of 10. Prehospital EKG suggestive of anterior wall STEMI. EKG at arrival shows progression.  Patient treated as a code STEMI and brought to  the Cath Lab by Dr. Katrinka Blazing.  CRITICAL CARE Performed by: Chelsea Crease   Total critical care time: 30 minutes  Critical care time was exclusive of separately billable procedures and treating other patients.  Critical care was necessary to treat or prevent imminent or life-threatening deterioration.  Critical care was time spent personally by me on the following activities: development of treatment plan with patient and/or surrogate as well as nursing, discussions with consultants, evaluation of patient's response to treatment, examination of patient, obtaining history from patient or surrogate, ordering and performing treatments and interventions, ordering and review of laboratory studies, ordering and review of radiographic studies, pulse oximetry and re-evaluation of patient's condition.   Final Clinical Impressions(s) / ED Diagnoses   Final diagnoses:  Anterior wall myocardial infarction [facility])    New Prescriptions Current Discharge Medication List    I personally performed the services described in this documentation, which was scribed in my presence. The recorded information has been reviewed and is accurate.     Chelsea Crease, MD 08/19/16 [phone]

## 2016-08-19 NOTE — Progress Notes (Signed)
Patient ID: Chelsea Fuller, female   DOB: 10/03/1924, 82 y.o.   MRN: [id number]    SUBJECTIVE: Mild residual soreness in chest, much better than prior to PCI. No dyspnea.   LHC (10/28): Occluded mid LAD.  Patient had DES x 2 (proximal and mid LAD). EF 35-35% on LV-gram.   Scheduled Meds: . aspirin EC  81 mg Oral Daily  . atorvastatin  80 mg Oral q1800  . carvedilol  6.25 mg Oral BID WC  . clopidogrel  75 mg Oral Daily  . enoxaparin (LOVENOX) injection  30 mg Subcutaneous Q24H  . levothyroxine  88 mcg Oral QAC breakfast  . sodium chloride flush  3 mL Intravenous Q12H   Continuous Infusions: . sodium chloride    . nitroGLYCERIN 5 mcg/min (08/19/16 0331)   PRN Meds:.sodium chloride, acetaminophen, nitroGLYCERIN, ondansetron (ZOFRAN) IV, oxyCODONE-acetaminophen, sodium chloride flush    Vitals:   08/19/16 0745 08/19/16 0800 08/19/16 0815 08/19/16 0830  BP: (!) 129/112 (!) 156/71 (!) 149/76 134/78  Pulse: 77 75 77 72  Resp: 20 16 19  (!) 22  Temp:      TempSrc:      SpO2: 93% 93% 93% 95%  Weight:      Height:        Intake/Output Summary (Last 24 hours) at 08/19/16 0843 Last data filed at 08/19/16 0800  Gross per 24 hour  Intake           172.43 ml  Output              500 ml  Net          -327.57 ml    LABS: Basic Metabolic Panel:  Recent Labs  22/11/5408/29/17 0103 08/19/16 0328  NA 139 136  K 3.7 4.0  CL 102 102  CO2 27 24  GLUCOSE 194* 197*  BUN 27* 25*  CREATININE 1.54* 1.42*  CALCIUM 9.0 8.8*  MG  --  1.7   Liver Function Tests:  Recent Labs  08/19/16 0103  AST 23  ALT 16  ALKPHOS 53  BILITOT 0.5  PROT 6.6  ALBUMIN 3.5   No results for input(s): LIPASE, AMYLASE in the last 72 hours. CBC:  Recent Labs  08/19/16 0103 08/19/16 0328  WBC 8.8 11.5*  NEUTROABS 4.7  --   HGB 11.7* 12.0  HCT 36.2 38.0  MCV 95.3 95.5  PLT 169 172   Cardiac Enzymes:  Recent Labs  08/19/16 0103 08/19/16 0328  TROPONINI 0.03* 12.92*   BNP: Invalid  input(s): POCBNP D-Dimer: No results for input(s): DDIMER in the last 72 hours. Hemoglobin A1C: No results for input(s): HGBA1C in the last 72 hours. Fasting Lipid Panel:  Recent Labs  08/19/16 0328  CHOL 187  HDL 64  LDLCALC 113*  TRIG 52  CHOLHDL 2.9   Thyroid Function Tests:  Recent Labs  08/19/16 0328  TSH 3.835   Anemia Panel: No results for input(s): VITAMINB12, FOLATE, FERRITIN, TIBC, IRON, RETICCTPCT in the last 72 hours.  RADIOLOGY: Dg Chest Port 1 View  Result Date: 08/19/2016 CLINICAL DATA:  Chest pain trauma EXAM: PORTABLE CHEST 1 VIEW COMPARISON:  04/17/2016 FINDINGS: AP portable semi-erect view of the chest. The lungs appear hyperinflated. Mild diffuse interstitial opacities are unchanged, presumed chronic. No acute infiltrate or effusion. Stable moderate cardiomegaly. Atherosclerosis of the aorta. No pneumothorax. IMPRESSION: Moderate cardiomegaly and atherosclerosis. No acute infiltrate or edema. No pneumothorax. Electronically Signed   By: Jasmine PangKim  Fujinaga M.D.   On: 08/19/2016 01:28  PHYSICAL EXAM General: NAD Neck: JVP 8-9 cm, no thyromegaly or thyroid nodule.  Lungs: Clear to auscultation bilaterally with normal respiratory effort. CV: Nondisplaced PMI.  Heart irregular S1/S2, no S3/S4, no murmur.  No peripheral edema.   Abdomen: Soft, nontender, no hepatosplenomegaly, no distention.  Neurologic: Alert and oriented x 3.  Psych: Normal affect. Extremities: No clubbing or cyanosis. Ecchymosis around right radial cath site, palpable pulse.   TELEMETRY: Reviewed telemetry pt in atrial fibrillation, rate 70s  ASSESSMENT AND PLAN: 82 yo with history of HTN, CVA, and chronic atrial fibrillation presented with anterior STEMI.  1. CAD: Anterior STEMI 10/28, now s/p DES x 2 to proximal and mid LAD.  Very mild residual chest pain.  - Continue ASA 81 and Plavix for now.  See discussion of atrial fibrillation below, suspect we may want to drop the ASA and add  Xarelto 15 daily.  - Increase atorvastatin to 80 mg daily.  - Right radial cath site with ecchymosis, TR band deflating slowly.  Keep close watch.  2. Atrial fibrillation: Chronic, rate is controlled.  She had a CVA in 2012.  She has had atrial fibrillation for years.  Refused coumadin because she did not want to go back and forth for INR checks.  CHADSVASC = 7.  She lives alone, generally has been stable on her feet with a walker.  1 fall about 2 months ago.  Would see how she does with PT/cardiac rehab.  If she is not deemed a markedly high fall risk, would favor a regimen of Plavix 75 + Xarelto 15 daily at home without ASA, dropping Plavix at 1 year.  I will not start the Xarelto yet, would see how she does walking.  3. Ischemic cardiomyopathy: EF 25-35% on LV-gram.  Awaiting echo.   - Coreg 6.25 mg bid.  - Hold off on ACEI/ARB and spironolactone for now => baseline CKD and got 300 cc contrast with cath.  If creatinine stable, can start prior to discharge.  - Mild volume overload on exam.  Not short of breath.  Hold off on Lasix for now but can give if needed.  4. CKD: Stage III.  She was gently hydrated post-cath.  Got a lot of contrast, follow creatinine.   [name] 08/19/2016 8:51 AM

## 2016-08-20 ENCOUNTER — Encounter (HOSPITAL_COMMUNITY): Payer: Self-pay | Admitting: Interventional Cardiology

## 2016-08-20 ENCOUNTER — Inpatient Hospital Stay (HOSPITAL_COMMUNITY): Payer: Medicare Other

## 2016-08-20 DIAGNOSIS — I213 ST elevation (STEMI) myocardial infarction of unspecified site: Secondary | ICD-10-CM

## 2016-08-20 DIAGNOSIS — I110 Hypertensive heart disease with heart failure: Secondary | ICD-10-CM

## 2016-08-20 DIAGNOSIS — E7800 Pure hypercholesterolemia, unspecified: Secondary | ICD-10-CM

## 2016-08-20 LAB — TROPONIN I
TROPONIN I: 0.03 ng/mL — AB (ref <0.03)
Troponin I: 25.98 ng/mL (ref <0.03)
Troponin I: 30.75 ng/mL (ref <0.03)
Troponin I: 29.71 ng/mL (ref <0.03)

## 2016-08-20 LAB — POCT I-STAT, CHEM 8
BUN: 30 mg/dL — AB (ref 6–20)
CALCIUM ION: 1.14 mmol/L — AB (ref 1.15–1.40)
CHLORIDE: 94 mmol/L — AB (ref 101–111)
Creatinine, Ser: 1.2 mg/dL — ABNORMAL HIGH (ref 0.44–1.00)
Glucose, Bld: 171 mg/dL — ABNORMAL HIGH (ref 65–99)
HCT: 34 % — ABNORMAL LOW (ref 36.0–46.0)
Hemoglobin: 11.6 g/dL — ABNORMAL LOW (ref 12.0–15.0)
Potassium: 3.4 mmol/L — ABNORMAL LOW (ref 3.5–5.1)
SODIUM: 134 mmol/L — AB (ref 135–145)
TCO2: 25 mmol/L (ref 0–100)

## 2016-08-20 LAB — POCT ACTIVATED CLOTTING TIME
ACTIVATED CLOTTING TIME: 252 s
Activated Clotting Time: 224 seconds
Activated Clotting Time: 263 seconds
Activated Clotting Time: 676 seconds

## 2016-08-20 LAB — ECHOCARDIOGRAM COMPLETE
CHL CUP LVOT MV VTI INDEX: 0.62 cm2/m2
CHL CUP MV M VEL: 79.8
FS: 31 % (ref 28–44)
Height: 64 in
IVS/LV PW RATIO, ED: 0.83
LA ID, A-P, ES: 41 mm
LA diam end sys: 41 mm
LA vol A4C: 64.8 ml
LADIAMINDEX: 2.37 cm/m2
LAVOL: 65.7 mL
LAVOLIN: 38 mL/m2
LDCA: 2.27 cm2
LVOT MV VTI: 1.07
LVOT VTI: 14 cm
LVOT diameter: 17 mm
LVOT peak vel: 65 cm/s
LVOTSV: 32 mL
Lateral S' vel: 8.8 cm/s
MV Annulus VTI: 29.7 cm
Mean grad: 3 mmHg
PW: 12 mm — AB (ref 0.6–1.1)
TAPSE: 10.9 mm
Weight: 2409.19 oz

## 2016-08-20 LAB — BASIC METABOLIC PANEL
ANION GAP: 9 (ref 5–15)
BUN: 20 mg/dL (ref 6–20)
CALCIUM: 9 mg/dL (ref 8.9–10.3)
CO2: 26 mmol/L (ref 22–32)
CREATININE: 1.34 mg/dL — AB (ref 0.44–1.00)
Chloride: 103 mmol/L (ref 101–111)
GFR calc Af Amer: 39 mL/min — ABNORMAL LOW (ref >60)
GFR calc non Af Amer: 34 mL/min — ABNORMAL LOW (ref >60)
GLUCOSE: 155 mg/dL — AB (ref 65–99)
Potassium: 3.9 mmol/L (ref 3.5–5.1)
Sodium: 138 mmol/L (ref 135–145)

## 2016-08-20 LAB — CBC
HCT: 36.9 % (ref 36.0–46.0)
Hemoglobin: 11.5 g/dL — ABNORMAL LOW (ref 12.0–15.0)
MCH: 30.6 pg (ref 26.0–34.0)
MCHC: 31.2 g/dL (ref 30.0–36.0)
MCV: 98.1 fL (ref 78.0–100.0)
Platelets: 156 10*3/uL (ref 150–400)
RBC: 3.76 MIL/uL — ABNORMAL LOW (ref 3.87–5.11)
RDW: 14 % (ref 11.5–15.5)
WBC: 11 10*3/uL — ABNORMAL HIGH (ref 4.0–10.5)

## 2016-08-20 LAB — HEMOGLOBIN A1C
HEMOGLOBIN A1C: 6.6 % — AB (ref 4.8–5.6)
MEAN PLASMA GLUCOSE: 143 mg/dL

## 2016-08-20 MED ORDER — ATORVASTATIN CALCIUM 80 MG PO TABS: 80.0000 mg | ORAL_TABLET | Freq: Every day | ORAL | 3 refills | Status: DC

## 2016-08-20 MED ORDER — CARVEDILOL 6.25 MG PO TABS: 6.2500 mg | ORAL_TABLET | Freq: Two times a day (BID) | ORAL | 11 refills | Status: DC

## 2016-08-20 MED ORDER — RIVAROXABAN 15 MG PO TABS
15.0000 mg | ORAL_TABLET | Freq: Every day | ORAL | Status: DC
  Filled 2016-08-20: qty 1

## 2016-08-20 MED ORDER — CARVEDILOL 6.25 MG PO TABS: 6.2500 mg | ORAL_TABLET | Freq: Two times a day (BID) | ORAL | 3 refills | Status: DC

## 2016-08-20 MED ORDER — ATORVASTATIN CALCIUM 80 MG PO TABS: 80.0000 mg | ORAL_TABLET | Freq: Every day | ORAL | 11 refills | Status: DC

## 2016-08-20 MED FILL — Heparin Sodium (Porcine) 2 Unit/ML in Sodium Chloride 0.9%: INTRAMUSCULAR | Qty: 500 | Status: AC

## 2016-08-20 MED FILL — Clopidogrel Bisulfate Tab 75 MG (Base Equiv): ORAL | Qty: 2 | Status: AC

## 2016-08-20 NOTE — Progress Notes (Signed)
CARDIAC REHAB PHASE I   PRE:  Rate/Rhythm: 93 afib    BP: sitting 97/57    SaO2: 96 RA  MODE:  Ambulation: 170 ft   POST:  Rate/Rhythm: 101 afib    BP: sitting 95/58     SaO2: 93 RA  Pt able to stand independently from recliner. Used RW. Did not hit obstacles but she did stagger her feet several times during walk, getting them slightly crossed. Used RW, gait belt, assist x2 for safety although did not require physical correction. Pt sts she was getting fatigued toward end. Daughter notes she is less SOB now than she was PTA. To recliner. Gave pt and daughter MI book and stent card, discussed restrictions and using Plavix. Voiced understanding. I will send referral to [place] [facility] however I am not sure pt will be able to do program. Encouraged her to walk with HHPT/family at home.  [phone]   Chelsea MassonRandi Kristan Nalee Fuller CES, ACSM 08/20/2016 2:42 PM

## 2016-08-20 NOTE — Progress Notes (Signed)
Patient Name: Chelsea Fuller Date of Encounter: 08/20/2016  Primary Cardiologist: Dr Katrinka BlazingSmith (new)  Hospital Problem List     Active Problems:   STEMI (ST elevation myocardial infarction) [facility](HCC)   Anterior wall myocardial infarction [facility](HCC)   Cardiomyopathy, ischemic   Chronic atrial fibrillation (HCC)     Subjective   Still w/ some chest soreness, no change w/ position or deep inspiration. Denies SOB, still on O2. Wants to go home today. Lives alone but daughter can stay w/ her if needed.  Inpatient Medications    Scheduled Meds: . aspirin EC  81 mg Oral Daily  . atorvastatin  80 mg Oral q1800  . carvedilol  6.25 mg Oral BID WC  . clopidogrel  75 mg Oral Daily  . enoxaparin (LOVENOX) injection  30 mg Subcutaneous Q24H  . levothyroxine  88 mcg Oral QAC breakfast  . sodium chloride flush  3 mL Intravenous Q12H   Continuous Infusions: . sodium chloride    . nitroGLYCERIN Stopped (08/19/16 1600)   PRN Meds: sodium chloride, acetaminophen, nitroGLYCERIN, ondansetron (ZOFRAN) IV, oxyCODONE-acetaminophen, sodium chloride flush   Vital Signs    Vitals:   08/20/16 0200 08/20/16 0300 08/20/16 0400 08/20/16 0500  BP: (!) 109/51 (!) 150/55 135/67 109/67  Pulse: 69 79 89 76  Resp: 18 (!) 22 (!) 28 16  Temp:   97.6 F (36.4 C)   TempSrc:   Oral   SpO2: 96% 97% 97% 96%  Weight:   150 lb 9.2 oz (68.3 kg)   Height:        Intake/Output Summary (Last 24 hours) at 08/20/16 0659 Last data filed at 08/20/16 0158  Gross per 24 hour  Intake            499.5 ml  Output              825 ml  Net           -325.5 ml   Filed Weights   08/19/16 0104 08/19/16 0312 08/20/16 0400  Weight: 140 lb (63.5 kg) 153 lb 3.5 oz (69.5 kg) 150 lb 9.2 oz (68.3 kg)    Physical Exam   GEN: Well nourished, well developed, in no acute distress.  HEENT: Grossly normal.  Neck: Supple, JVD 9 cm, no carotid bruits, or masses. Cardiac: Irreg R&R, soft murmur, no rubs, or gallops. No clubbing,  cyanosis, edema.  Radials/DP/PT 2+ and equal bilaterally. R radial pulse intact Respiratory:  Respirations regular and unlabored, rales bases bilaterally. GI: Soft, nontender, nondistended, BS + x 4. MS: no deformity or atrophy. Skin: warm and dry, no rash. Ecchymosis R hand and wrist is extensive Neuro:  Strength and sensation are intact. Psych: AAOx3.  Normal affect.  Labs    CBC  Recent Labs  08/19/16 0103 08/19/16 0328 08/20/16 0338  WBC 8.8 11.5* 11.0*  NEUTROABS 4.7  --   --   HGB 11.7* 12.0 11.5*  HCT 36.2 38.0 36.9  MCV 95.3 95.5 98.1  PLT 169 172 156   Basic Metabolic Panel  Recent Labs  08/19/16 0328 08/20/16 0338  NA 136 138  K 4.0 3.9  CL 102 103  CO2 24 26  GLUCOSE 197* 155*  BUN 25* 20  CREATININE 1.42* 1.34*  CALCIUM 8.8* 9.0  MG 1.7  --    Liver Function Tests  Recent Labs  08/19/16 0103  AST 23  ALT 16  ALKPHOS 53  BILITOT 0.5  PROT 6.6  ALBUMIN 3.5  Cardiac Enzymes  Recent Labs  08/19/16 1509 08/19/16 2037 08/20/16 0342  TROPONINI 51.07* 46.87* 25.98*   Fasting Lipid Panel  Recent Labs  08/19/16 0328  CHOL 187  HDL 64  LDLCALC 113*  TRIG 52  CHOLHDL 2.9   Thyroid Function Tests  Recent Labs  08/19/16 0328  TSH 3.835    Telemetry    Atrial fib, rate generally ok. PVCs, pairs and 5 bt run NSVT - Personally Reviewed  ECG    n/a - Personally Reviewed  Radiology    Dg Chest Port 1 View  Result Date: 08/19/2016 CLINICAL DATA:  Chest pain trauma EXAM: PORTABLE CHEST 1 VIEW COMPARISON:  04/17/2016 FINDINGS: AP portable semi-erect view of the chest. The lungs appear hyperinflated. Mild diffuse interstitial opacities are unchanged, presumed chronic. No acute infiltrate or effusion. Stable moderate cardiomegaly. Atherosclerosis of the aorta. No pneumothorax. IMPRESSION: Moderate cardiomegaly and atherosclerosis. No acute infiltrate or edema. No pneumothorax. Electronically Signed   By: Jasmine PangKim  Fujinaga M.D.   On:  08/19/2016 01:28    Cardiac Studies   ECHO: ORDERED  10/29 CATH  Prox RCA to Dist RCA lesion, 20 %stenosed.  Prox LAD lesion, 50 %stenosed.  LM lesion, 30 %stenosed.  A STENT SYNERGY DES 2.25X32 drug eluting stent was successfully placed.  Dist LAD lesion, 100 %stenosed.  Post intervention, there is a 0% residual stenosis.  A STENT SYNERGY DES 2.5X24 drug eluting stent was successfully placed, and does not overlap previously placed stent.  Mid LAD lesion, 80 %stenosed.  Post intervention, there is a 0% residual stenosis.  There is moderate to severe left ventricular systolic dysfunction.  The left ventricular ejection fraction is 25-35% by visual estimate.  LV end diastolic pressure is moderately elevated.   Acute anterior ST elevation myocardial infarction.  Successful PCI with stenting of the mid LAD from 100% to 0% with a Synergy 2.25 x 32 drug-eluting stent with TIMI grade 3 flow.  Successful primary stenting of a segmental 80% proximal LAD stenosis to 0% using a 2.5 x 24 Synergy drug-eluting stent.  Irregularities noted in the circumflex and right coronary.  Significant anteroapical wall motion abnormality/akinesis with estimated EF 25-35% with LVEDP 24 mmHg. RECOMMENDATIONS:  Aspirin and Plavix times at least one year  Convert diltiazem to metoprolol for rate control of atrial fibrillation and in the setting of ischemic heart disease.  Follow kidney function closely as patient had pre-contrast exposure chronic kidney disease. A total of 300 cc of contrast was used. She would be at high risk for developing acute kidney injury.  If dyspnea, IV Lasix should be given.  Gentle hydration  At high risk for complications given age and morbidities. Post-Intervention Diagram      Patient Profile     82 yo with history of HTN, CVA, and chronic atrial fibrillation presented 10/29 with anterior STEMI.   Assessment & Plan    1. CAD: Anterior STEMI 10/28, now  s/p DES x 2 to proximal and mid LAD.  Very mild residual chest pain.  - Continue ASA 81 and Plavix for now.  See discussion of atrial fibrillation below, suspect we may want to drop the ASA and add Xarelto 15 daily.  - Increase atorvastatin to 80 mg daily.  - Right radial cath site with ecchymosis, TR band deflating slowly.  Keep close watch.   2. Atrial fibrillation: Chronic, rate is controlled.  She had a CVA in 2012.   - She has had atrial fibrillation for years.  Refused  coumadin because she did not want to go back and forth for INR checks.  CHADSVASC = 7.  - She lives alone, generally has been stable on her feet with a walker.  1 fall about 2 months ago.  Would see how she does with PT/cardiac rehab (ordered).  - If she is not deemed a markedly high fall risk, would favor a regimen of Plavix 75 + Xarelto 15 daily at home without ASA, dropping Plavix at 1 year.  - See how she does walking, then decide on Xarelto. Get Case Management if goes on Xarelto to make sure she can afford it.   3. Ischemic cardiomyopathy: EF 25-35% on LV-gram.  Awaiting echo.   - Coreg 6.25 mg bid.  - ACEI/ARB and spironolactone not initially started 2nd baseline CKD and got 300 cc contrast with cath.  - Creatinine stable, discuss adding spiro vs lisinopril today.  - Mild volume overload on exam but CXR ok.  Not short of breath.  Hold off on Lasix for now but can give if needed.   4. CKD: Stage III.  She was gently hydrated post-cath.  Got a lot of contrast, follow creatinine. Trending down.  Signed, Theodore Demark, PA-C  08/20/2016, 6:59 AM

## 2016-08-20 NOTE — Discharge Instructions (Addendum)

## 2016-08-20 NOTE — Progress Notes (Signed)
Occupational Therapy Evaluation Patient Details Name: Chelsea NapKathleen L Fuller MRN: [id number] DOB: 06/28/1924 Today's Date: 08/20/2016    History of Present Illness 82 year old female with longstanding persistent atrial fibrillation, CKD3 here with STEMI. Underwent stenting.   Clinical Impression   PTA, pt modified independent with ADL and functional mobility, including driving. Pt making progress and apparently doing better this pm than earlier session with PT. Daughter states her Mom did not sleep well last night. Feel pt safe to D/C home with 24/7 S initially and direct physical assistance with all mobility and ADL. O2 Sats remained in 90s. VSS. Daughter states family will be able to provide S for safe D/C home. Recommend follow up with HHOT to facilitate return to PLOF.     Follow Up Recommendations  Home health OT;Supervision/Assistance - 24 hour    Equipment Recommendations  None recommended by OT    Recommendations for Other Services       Precautions / Restrictions Precautions Precautions: Fall Precaution Comments: watch saturations and HR Restrictions Weight Bearing Restrictions: No      Mobility Bed Mobility               General bed mobility comments: received in chair  Transfers Overall transfer level: Needs assistance Equipment used: 1 person hand held assist   Sit to Stand: Min guard         General transfer comment: 2 trials to stand due to LOB posteriorly. Daughter states "she does that sometimes"/  Educated pt on controlling descent to chair and reaching back for armrests.    Balance     Sitting balance-Leahy Scale: Good       Standing balance-Leahy Scale: Fair                 High Level Balance Comments: min assit at [employer] RW            ADL Overall ADL's : Needs assistance/impaired     Grooming: Set up   Upper Body Bathing: Set up;Sitting   Lower Body Bathing: Min guard   Upper Body Dressing : Set up;Sitting    Lower Body Dressing: Min guard;Sit to/from stand   Toilet Transfer: Min guard;Ambulation;Cueing for safety;BSC   Toileting- Clothing Manipulation and Hygiene: Supervision/safety       Functional mobility during ADLs: Rolling walker;Minimal assistance General ADL Comments: Pt with occasional unsteadiness. Discussed home set up and need for initial 24/7 S after D/C. Emphasized need for direct physical assistance with functional mobility until she returns to baseline. Pt/daughter verbalized understanding.   Educated on reducing risk of falls at home and energy conservation.     Vision     Perception     Praxis      Pertinent Vitals/Pain Pain Assessment: No/denies pain     Hand Dominance Right   Extremity/Trunk Assessment Upper Extremity Assessment Upper Extremity Assessment: Generalized weakness   Lower Extremity Assessment Lower Extremity Assessment: Defer to PT evaluation   Cervical / Trunk Assessment Cervical / Trunk Assessment: Normal   Communication Communication Communication: No difficulties   Cognition Arousal/Alertness: Awake/alert Behavior During Therapy: WFL for tasks assessed/performed Overall Cognitive Status: Impaired/Different from baseline (daughter states returning to baseline.) Area of Impairment: Awareness;Memory           Awareness: Emergent   General Comments: Daughter states pt was "out of it" earlier this am. Feels she is more lert andf like "herself". ?related to anesthesai and not sleeping well last night.   General Comments  Exercises       Shoulder Instructions      Home Living Family/patient expects to be discharged to:: Private residence Living Arrangements: Alone Available Help at Discharge: Family;Available 24 hours/day Type of Home: House Home Access: Stairs to enter Entergy CorporationEntrance Stairs-Number of Steps: 1 Entrance Stairs-Rails: Left Home Layout: One level     Bathroom Shower/Tub: Producer, television/film/videoWalk-in shower   Bathroom Toilet:  Standard     Home Equipment: Environmental consultantWalker - 2 wheels;Cane - single point;Bedside commode;Shower seat          Prior Functioning/Environment Level of Independence: Independent with assistive device(s)        Comments: uses a cane for mobility        OT Problem List: Decreased strength;Decreased activity tolerance;Impaired balance (sitting and/or standing);Decreased safety awareness;Decreased knowledge of use of DME or AE;Cardiopulmonary status limiting activity   OT Treatment/Interventions: Self-care/ADL training;Therapeutic exercise;Energy conservation;DME and/or AE instruction;Therapeutic activities;Patient/family education;Balance training    OT Goals(Current goals can be found in the care plan section) Acute Rehab OT Goals Patient Stated Goal: to go home OT Goal Formulation: With patient Time For Goal Achievement: 08/27/16 Potential to Achieve Goals: Good  OT Frequency: Min 2X/week   Barriers to D/C:            Co-evaluation              End of Session Equipment Utilized During Treatment: Gait belt;Rolling walker Nurse Communication: Mobility status  Activity Tolerance: Patient tolerated treatment well Patient left: in chair;with call bell/phone within reach;with family/visitor present   Time: 1441-1505 OT Time Calculation (min): 24 min Charges:  OT General Charges $OT Visit: 1 Procedure OT Evaluation $OT Eval Moderate Complexity: 1 Procedure G-Codes:    Shiheem Corporan,HILLARY 08/20/2016, 3:13 PM [facility] Chantell Kunkler, OT/L  [phone] 08/20/2016

## 2016-08-20 NOTE — Progress Notes (Signed)
Discharge paper work reviewed with patient and family. All questions answered. All belongings with patient.

## 2016-08-20 NOTE — Discharge Summary (Signed)
Discharge Summary    Patient ID: Chelsea Fuller,  MRN: [id number], DOB/AGE: 79/02/1924 82 y.o.  Admit date: 08/19/2016 Discharge date: 08/20/2016  Primary Care Provider: Enrique SackGREEN, EDWIN JAY Primary Cardiologist: Dr Katrinka BlazingSmith  Discharge Diagnoses    Principal Problem:   Anterior wall myocardial infarction [facility](HCC) Active Problems:   Paroxysmal atrial fibrillation (HCC)   History of CVA (cerebrovascular accident)   HTN (hypertension)   Hypothyroidism   Cardiomyopathy, ischemic   Chronic atrial fibrillation (HCC)  Allergies No Known Allergies  Diagnostic Studies/Procedures    CATH: 08/19/2016  Prox RCA to Dist RCA lesion, 20 %stenosed.  Prox LAD lesion, 50 %stenosed.  LM lesion, 30 %stenosed.  A STENT SYNERGY DES 2.25X32 drug eluting stent was successfully placed.  Dist LAD lesion, 100 %stenosed.  Post intervention, there is a 0% residual stenosis.  A STENT SYNERGY DES 2.5X24 drug eluting stent was successfully placed, and does not overlap previously placed stent.  Mid LAD lesion, 80 %stenosed.  Post intervention, there is a 0% residual stenosis.  There is moderate to severe left ventricular systolic dysfunction.  The left ventricular ejection fraction is 25-35% by visual estimate.  LV end diastolic pressure is moderately elevated.   Acute anterior ST elevation myocardial infarction.  Successful PCI with stenting of the mid LAD from 100% to 0% with a Synergy 2.25 x 32 drug-eluting stent with TIMI grade 3 flow.  Successful primary stenting of a segmental 80% proximal LAD stenosis to 0% using a 2.5 x 24 Synergy drug-eluting stent.  Irregularities noted in the circumflex and right coronary.  Significant anteroapical wall motion abnormality/akinesis with estimated EF 25-35% with LVEDP 24 mmHg. RECOMMENDATIONS:  Aspirin and Plavix times at least one year  Convert diltiazem to metoprolol for rate control of atrial fibrillation and in the setting of ischemic  heart disease.  Follow kidney function closely as patient had pre-contrast exposure chronic kidney disease. A total of 300 cc of contrast was used. She would be at high risk for developing acute kidney injury.  If dyspnea, IV Lasix should be given.  Gentle hydration  At high risk for complications given age and morbidities. _____________   History of Present Illness     82 y.o.female, with a medical history of hypertension, hypothyroidism, atrial fibrillation, CKD and stroke (2010) who presented to the ED early on 10/29 as a CODE STEMI.   Hospital Course     Consultants: None   She was taken directly to the cath lab and underwent DES x 2 to proximal and mid LAD. She tolerated the procedure well. She had some residual soreness in her chest, but her ECG and enzymes improved.   She was started on high-dose statin and a beta blocker. She was hydrated gently and her BUN/Cr improved overnight. She had extensive ecchymosis at her cath site and this will need to be watched. Her H&H did not change much and distal pulses were intact.  Her EF was 25% at cath, considered ICM. She was started on Coreg, but no ACE/ARB secondary to renal insufficiency.    She was seen by cardiac rehab and was very weak. She was seen by PT and felt to need home PT/OT, which was ordered.   Her atrial fib was controlled with Coreg. She had been on metoprolol and Diltiazem PTA, but these were discontinued. She had not been anticoagulated PTA, refusing coumadin. Initially, it was thought that we should add Xarelto to her medication regimen. However, with her age and frailty,  triple drug therapy was felt to be too risky. She will go home on ASA and Plavix, consider Xarelto in a year or sooner as needed.  2D ECHO was done on the day of discharge. She was discharged before it was formally read. It will be followed in the outpatient setting.   _____________  Discharge Vitals Blood pressure (!) 112/56, pulse 84,  temperature 97.4 F (36.3 C), temperature source Oral, resp. rate 13, height 5\' 4"  (1.626 m), weight 150 lb 9.2 oz (68.3 kg), SpO2 95 %.  Filed Weights   08/19/16 0104 08/19/16 0312 08/20/16 0400  Weight: 140 lb (63.5 kg) 153 lb 3.5 oz (69.5 kg) 150 lb 9.2 oz (68.3 kg)    Labs & Radiologic Studies    CBC  Recent Labs  08/19/16 0103  08/19/16 0328 08/20/16 0338  WBC 8.8  --  11.5* 11.0*  NEUTROABS 4.7  --   --   --   HGB 11.7*  < > 12.0 11.5*  HCT 36.2  < > 38.0 36.9  MCV 95.3  --  95.5 98.1  PLT 169  --  172 156  < > = values in this interval not displayed. Basic Metabolic Panel  Recent Labs  08/19/16 0328 08/20/16 0338  NA 136 138  K 4.0 3.9  CL 102 103  CO2 24 26  GLUCOSE 197* 155*  BUN 25* 20  CREATININE 1.42* 1.34*  CALCIUM 8.8* 9.0  MG 1.7  --    Liver Function Tests  Recent Labs  08/19/16 0103  AST 23  ALT 16  ALKPHOS 53  BILITOT 0.5  PROT 6.6  ALBUMIN 3.5   Cardiac Enzymes  Recent Labs  08/20/16 0342 08/20/16 0935 08/20/16 1626  TROPONINI 25.98* 30.75* 29.71*   Hemoglobin A1C  Recent Labs  08/19/16 0328  HGBA1C 6.6*   Fasting Lipid Panel  Recent Labs  08/19/16 0328  CHOL 187  HDL 64  LDLCALC 113*  TRIG 52  CHOLHDL 2.9   Thyroid Function Tests  Recent Labs  08/19/16 0328  TSH 3.835   _____________  Dg Chest Port 1 View Result Date: 08/19/2016 CLINICAL DATA:  Chest pain trauma EXAM: PORTABLE CHEST 1 VIEW COMPARISON:  04/17/2016 FINDINGS: AP portable semi-erect view of the chest. The lungs appear hyperinflated. Mild diffuse interstitial opacities are unchanged, presumed chronic. No acute infiltrate or effusion. Stable moderate cardiomegaly. Atherosclerosis of the aorta. No pneumothorax. IMPRESSION: Moderate cardiomegaly and atherosclerosis. No acute infiltrate or edema. No pneumothorax. Electronically Signed   By: Jasmine PangKim  Fujinaga M.D.   On: 08/19/2016 01:28   Disposition   Pt is being discharged home today in good  condition.  Follow-up Plans & Appointments    Follow-up Information    Cline CrockKathryn Thompson, PA-C Follow up on 08/28/2016.   Specialties:  Cardiology, Radiology Why:  See provider at 9:30 am, please arrive 15 minutes early for paperwork. Contact information: [address] [place] [id number] [phone]          Discharge Instructions    Amb Referral to Cardiac Rehabilitation    Complete by:  As directed    To [facility]   Diagnosis:   STEMI PTCA Coronary Stents        Discharge Medications   Current Discharge Medication List    START taking these medications   Details  atorvastatin (LIPITOR) 80 MG tablet Take 1 tablet (80 mg total) by mouth daily at 6 PM. Qty: 90 tablet, Refills: 3    carvedilol (  COREG) 6.25 MG tablet Take 1 tablet (6.25 mg total) by mouth 2 (two) times daily with a meal. Qty: 180 tablet, Refills: 3      CONTINUE these medications which have NOT CHANGED   Details  acetaminophen (TYLENOL) 500 MG tablet Take 500 mg by mouth at bedtime as needed for mild pain.     aspirin EC 81 MG tablet Take 81 mg by mouth daily.    clopidogrel (PLAVIX) 75 MG tablet Take 75 mg by mouth daily.    levothyroxine (SYNTHROID, LEVOTHROID) 88 MCG tablet Take 88 mcg by mouth daily before breakfast.    loteprednol (ALREX) 0.2 % SUSP Place 1 drop into both eyes 2 (two) times daily.    Multiple Vitamins-Minerals (OCUVITE PO) Take 1 tablet by mouth daily.    nitroGLYCERIN (NITROSTAT) 0.4 MG SL tablet Place 0.4 mg under the tongue every 5 (five) minutes as needed for chest pain.       STOP taking these medications     diltiazem (TIAZAC) 120 MG 24 hr capsule      metoprolol tartrate (LOPRESSOR) 25 MG tablet          Outstanding Labs/Studies   none  Duration of Discharge Encounter   Greater than 30 minutes including physician time.  Signed, Cline Crock NP 08/20/2016, 6:35 PM

## 2016-08-20 NOTE — Care Management Note (Addendum)
Case Management Note  Patient Details  Name: Chelsea Fuller MRN: [id number] Date of Birth: 11/14/1923  Subjective/Objective:                    Action/Plan:  Received orders for home health PT/OT , referral phoned [phone] and [phone] .   PT recommending HHPT and 24 hour ( hands on ) supervision. Spoke to patient and daughter Clayborne Danaatti at bedside . Explained I have paged PA to get home health PT orders and waiting to see if patient will be discharged home on Xarelto.   [facility] , home phone is correct , patient's cell has changed to [phone] .   [facility] .   Provided patient with 30 day free card for Xarelto , entered benefit check early this am also. Have not heard back.  Explained it has not been confirmed if she is going home on Xarelto or not . Patient and Clayborne Danaatti voiced understanding.  Expected Discharge Date:                  Expected Discharge Plan:  Home w Home Health Services  In-House Referral:     Discharge planning Services  CM Consult  Post Acute Care Choice:    Choice offered to:  Patient, Adult Children  DME Arranged:    DME Agency:     HH Arranged:    HH Agency:     Status of Service:  In process, will continue to follow  If discussed at Long Length of Stay Meetings, dates discussed:    Additional Comments:  Kingsley PlanWile, Lashica Hannay Marie, RN 08/20/2016, 2:05 PM

## 2016-08-20 NOTE — Progress Notes (Signed)
ANTICOAGULATION CONSULT NOTE - Initial Consult  Pharmacy Consult for Xarelto  Indication: atrial fibrillation  No Known Allergies  Patient Measurements: Height: 5\' 4"  (162.6 cm) Weight: 150 lb 9.2 oz (68.3 kg) IBW/kg (Calculated) : 54.7 Heparin Dosing Weight: n/a  Vital Signs: Temp: 98.3 F (36.8 C) (10/30 1200) Temp Source: Oral (10/30 1200) BP: 100/48 (10/30 1200) Pulse Rate: 81 (10/30 1200)  Labs:  Recent Labs  08/19/16 0103 08/19/16 0206 08/19/16 0328  08/19/16 2037 08/20/16 0338 08/20/16 0342 08/20/16 0935  HGB 11.7* 11.6* 12.0  --   --  11.5*  --   --   HCT 36.2 34.0* 38.0  --   --  36.9  --   --   PLT 169  --  172  --   --  156  --   --   APTT 29  --   --   --   --   --   --   --   LABPROT 13.7  --  16.7*  --   --   --   --   --   INR 1.04  --  1.34  --   --   --   --   --   CREATININE 1.54* 1.20* 1.42*  --   --  1.34*  --   --   TROPONINI 0.03*  --  12.92*  < > 46.87*  --  25.98* 30.75*  < > = values in this interval not displayed.  Estimated Creatinine Clearance: 27 mL/min (by C-G formula based on SCr of 1.34 mg/dL (H)).   Medical History: Past Medical History:  Diagnosis Date  . Coronary artery disease   . CVA (cerebral infarction)   . Hypertension   . Hypothyroidism     Medications:  Scheduled:  . aspirin EC  81 mg Oral Daily  . atorvastatin  80 mg Oral q1800  . carvedilol  6.25 mg Oral BID WC  . clopidogrel  75 mg Oral Daily  . levothyroxine  88 mcg Oral QAC breakfast  . rivaroxaban  15 mg Oral Q supper  . sodium chloride flush  3 mL Intravenous Q12H    Assessment: 82 yo female with hx afib, not previously on anticoagulation.  Pharmacy asked to start Xarelto.  Goal of Therapy:   Monitor platelets by anticoagulation protocol: Yes   Plan:  1. Start Xarelto 15 mg daily. 2. Will complete patient education prior to d/c today.  Tad MooreJessica Sarahgrace Broman, Pharm D, BCPS  Clinical Pharmacist Pager [phone]  08/20/2016 3:27 PM

## 2016-08-20 NOTE — Evaluation (Signed)
Physical Therapy Evaluation Patient Details Name: Chelsea NapKathleen L Fuller MRN: [id number] DOB: 09/15/1924 Today's Date: 08/20/2016   History of Present Illness  82 year old female with longstanding persistent atrial fibrillation, CKD3 here with STEMI  Clinical Impression  Patient seen for mobility assessment s/p STEMI. Patient very unsteady during mobility. Discussed concerns with patient and family. Family states they can provide 24/7 assist at home. Recommend continued skilled PT to address deficits and maximize function. Will see as indicated and progress as tolerated. Will need HHPT upon acute discharge.  OF NTOE: HR elevated Afib 120s with activity, patient denies SOB, saturations stable low 90s with mobility on room air.     Follow Up Recommendations Home health PT;Supervision/Assistance - 24 hour (hands on assist for mobility)    Equipment Recommendations  None recommended by PT    Recommendations for Other Services       Precautions / Restrictions Precautions Precautions: Fall Precaution Comments: watch saturations and HR Restrictions Weight Bearing Restrictions: No      Mobility  Bed Mobility               General bed mobility comments: received in chair  Transfers Overall transfer level: Needs assistance Equipment used: 1 person hand held assist Transfers: Sit to/from Stand Sit to Stand: Min assist         General transfer comment: instability noted upon standing, patient unable to keep balance in static standing. required hands on physical assist  Ambulation/Gait Ambulation/Gait assistance: Min assist Ambulation Distance (Feet): 160 Feet Assistive device: Rolling walker (2 wheeled) Gait Pattern/deviations: Step-through pattern;Decreased stride length;Shuffle;Staggering left;Staggering right;Drifts right/left;Narrow base of support Gait velocity: decreased Gait velocity interpretation: Below normal speed for age/gender General Gait Details: patient  extremely unsteay with ambulation, improved with use of RW but patient required hands on physical assist for stability and assist for maneauvering RW as she kept running into objects on the right side  Stairs            Wheelchair Mobility    Modified Rankin (Stroke Patients Only)       Balance Overall balance assessment: Needs assistance   Sitting balance-Leahy Scale: Good     Standing balance support: Bilateral upper extremity supported Standing balance-Leahy Scale: Poor Standing balance comment: patient very unsteady in static standing, required hands on physical assist to keep balance (min assist)             High level balance activites: Direction changes;Turns High Level Balance Comments: min assit with RW             Pertinent Vitals/Pain Pain Assessment: No/denies pain    Home Living Family/patient expects to be discharged to:: Private residence Living Arrangements: Alone Available Help at Discharge: Family;Available 24 hours/day Type of Home: House Home Access: Stairs to enter Entrance Stairs-Rails: Left Entrance Stairs-Number of Steps: 1 Home Layout: One level Home Equipment: Walker - 2 wheels;Cane - single point      Prior Function Level of Independence: Independent with assistive device(s)         Comments: uses a cane for mobility     Hand Dominance   Dominant Hand: Right    Extremity/Trunk Assessment   Upper Extremity Assessment: Generalized weakness           Lower Extremity Assessment: Generalized weakness         Communication   Communication: No difficulties  Cognition Arousal/Alertness: Awake/alert Behavior During Therapy: Flat affect Overall Cognitive Status: Impaired/Different from baseline Area of Impairment: Awareness;Orientation Orientation Level:  Disoriented to;Place         Awareness: Emergent   General Comments: patient did not realize she was a the hospital    General Comments       Exercises     Assessment/Plan    PT Assessment Patient needs continued PT services  PT Problem List Decreased strength;Decreased activity tolerance;Decreased balance;Decreased mobility;Decreased cognition;Decreased safety awareness;Cardiopulmonary status limiting activity          PT Treatment Interventions DME instruction;Gait training;Functional mobility training;Therapeutic activities;Balance training;Therapeutic exercise;Patient/family education    PT Goals (Current goals can be found in the Care Plan section)  Acute Rehab PT Goals Patient Stated Goal: to go home PT Goal Formulation: With patient/family Time For Goal Achievement: 09/03/16 Potential to Achieve Goals: Fair    Frequency Min 3X/week   Barriers to discharge        Co-evaluation               End of Session Equipment Utilized During Treatment: Gait belt Activity Tolerance: Patient tolerated treatment well Patient left: in chair;with call bell/phone within reach;with family/visitor present Nurse Communication: Mobility status         Time: [id number] PT Time Calculation (min) (ACUTE ONLY): 24 min   Charges:   PT Evaluation $PT Eval Moderate Complexity: 1 Procedure PT Treatments $Gait Training: 8-22 mins   PT G CodesFabio Asa:        Jerico Grisso J 08/20/2016, 10:43 AM Charlotte Crumbevon Lian Tanori, PT DPT  [phone]

## 2016-08-20 NOTE — Progress Notes (Signed)
  Echocardiogram 2D Echocardiogram has been performed.  Chelsea Fuller, Chelsea Fuller 08/20/2016, 5:23 PM

## 2016-08-20 NOTE — Progress Notes (Addendum)
Prior auth number for Xarelto is [id number] Good for 1 year.  Theodore DemarkBarrett, Rhonda, Cordelia Poche-C 08/20/2016 4:13 PM Beeper [phone]

## 2016-08-20 NOTE — Care Management (Signed)
Xarelto requires prior authorization [id number] option 3, co pay $40 / 30 days.  Patient and daughter aware and voice understanding.  Patient has 30 day free card.   Texted Theodore Demarkhonda Barrett with prior authorization information.    Ronny FlurryHeather Deandrea Vanpelt RN BSN [id number]

## 2016-08-21 ENCOUNTER — Telehealth: Payer: Self-pay | Admitting: *Deleted

## 2016-08-21 ENCOUNTER — Telehealth: Payer: Self-pay | Admitting: Interventional Cardiology

## 2016-08-21 NOTE — Telephone Encounter (Signed)
-----   Message from Dewain PenningPatricia H Trent sent at 08/20/2016  5:12 PM EDT ----- Regarding: TOC call needed Pt is a TOC for a STEMI, needs phone call. Pt will see Florentina AddisonKatie on Tuesday 11/7   Thanks Bethann Berkshirerisha

## 2016-08-21 NOTE — Telephone Encounter (Signed)
New message      Pt c/o medication issue:  1. Name of Medication: atorvastatin 2. How are you currently taking this medication (dosage and times per day)? 80mg  3. Are you having a reaction (difficulty breathing--STAT)? no 4. What is your medication issue? Daughter is calling to make sure Dr Katrinka BlazingSmith want pt to take 80mg  of atorvastatin.  Daughter states that her brother takes 20mg  of atorvastatin and he is a much heavier person than Chelsea Fuller.

## 2016-08-21 NOTE — Telephone Encounter (Signed)
[facility].  Patient understands to follow up with provider--yes--K. Janee Mornhompson, PA on 08/28/16 at 9:30 at [address] Patient understands discharge instructions?-yes  Patient understands medications and regiment?  -yes Patient understands to bring all medications to this visit?-yes   Information reviewed with pt and daughter.

## 2016-08-22 NOTE — Telephone Encounter (Signed)
Obtained verbal permission from pt to speak with daughter.  Daughter asking if it was appropriate for pt to be on Atorvastatin 80mg  QD as she thought that was kind of a high dose.  Advised daughter due to findings at the hospital and during cath we are typically aggressive with treatment in the beginning.  Daughter just concerned about side effects.  Advised daughter to have pt take the medication for now and update Bary CastillaKaty Thompson, [place] on how pt is doing on medication at her follow up visit on 08/28/16.  Daughter verbalized understanding and was in agreement with this plan.  [facility] and he wants to know if Dr. Katrinka BlazingSmith will be signing HH orders for pt?  Advised we normally have PCP follow these.  Viviann SpareSteven said Dr. Katrinka BlazingSmith had signed d/c orders so they have to try our office first.  Advised I will send message to Dr. Katrinka BlazingSmith for review and advisement.

## 2016-08-22 NOTE — Telephone Encounter (Signed)
New message    Viviann SpareSteven from [facility] is calling for orders for home health and he wants to know if Dr.Smith can sign off on the approval of the orders

## 2016-08-23 ENCOUNTER — Telehealth: Payer: Self-pay | Admitting: Interventional Cardiology

## 2016-08-23 MED ORDER — ATORVASTATIN CALCIUM 80 MG PO TABS: 40.0000 mg | ORAL_TABLET | Freq: Every day | ORAL | 3 refills | Status: DC

## 2016-08-23 NOTE — Telephone Encounter (Signed)
Called patient's daughter back. Patient's daughter stated patient has been having diarrhea since this morning, and she thinks this is being caused by Atorvastatin. Consulted DOD, Dr. Anne FuSkains, he recommend patient cut back her Atorvastatin to 40 mg for now to see if that helps. Encouraged patient's daughter to keep patient hydrated possibly with some Gatorade, due to diarrhea. Informed patient's daughter there are several GI bugs going around that the patient might have caught. Patient's daughter will cut patient's Atorvastatin in half to equal 40 mg and will make sure patient keeps hydrated. Patient's daughter verbalized understanding.

## 2016-08-23 NOTE — Telephone Encounter (Signed)
[name](daughter) is calling because Chelsea Fuller had a Stent placed on Sunday and her medication has been changed and she has diarrhea bad and they are asking can she take something over the counter of if she needs to stop or change medication .  Please call .Marland Kitchen. Thanks

## 2016-08-24 NOTE — Telephone Encounter (Signed)
Left message to call back  

## 2016-08-24 NOTE — Telephone Encounter (Signed)
They need to get PCP involved with that. I am taking care of her heart problems only.

## 2016-08-24 NOTE — Telephone Encounter (Signed)
Informed Viviann SpareSteven of recommendations per Dr Katrinka BlazingSmith.  Viviann SpareSteven verbalized understanding.

## 2016-08-26 NOTE — Progress Notes (Signed)
Cardiology Office Note    Date:  08/28/2016   ID:  Chelsea Fuller, DOB 03/05/1924, MRN [id number]  PCP:  Enrique SackGREEN, EDWIN JAY, MD  Cardiologist:  Dr. Katrinka BlazingSmith   CC: post hospital follow up.   History of Present Illness:  Chelsea Fuller is a 82 y.o. female with a history of hypertension, hypothyroidism, chronic atrial fibrillation ([facility] CKD and stroke (2010) and recently diagnosed CAD w/ STEMI s/p DESx2 to LAD who presents to clinic for follow up.   [facility]29 as a CODE STEMI. She was taken directly to the cath lab and underwent DES x 2 to proximal and mid LAD. Her EF was 25% at cath, considered ICM. She was started on Coreg, but no ACE/ARB secondary to renal insufficiency. Interestingly, 2D ECHO the following day showed an EF of 50-55% with akinesis of the anterior wall, mild MS, mild LAE, mod TR,  PA pressure 51. Her atrial fib was controlled with Coreg. She had been on metoprolol and Diltiazem PTA, but these were discontinued. She had not been anticoagulated PTA, refusing coumadin. Initially, it was thought that we should add Xarelto to her medication regimen. However, with her age and frailty, triple drug therapy was felt to be too risky. She will go home on ASA and Plavix, consider Xarelto in a year or sooner as needed.  Today she presents to clinic for follow up. She has overall doing pretty well and gaining strength. She has been walking everyday. No exertional chest pain or SOB. She is planning on working with cardiac rehab. No more diarrhea on the lower dose of atorvastatin. No LE edema, orthopnea or PND. No dizziness or syncope. No palpitations. No blood in stool or urine.    Past Medical History:  Diagnosis Date  . CAD (coronary artery disease)    a. 07/2016: anterior STEMI s/p DES x2 to LAD  . CVA (cerebral infarction)   . Hypertension   . Hypothyroidism     Past Surgical History:  Procedure Laterality Date  . CARDIAC  CATHETERIZATION N/A 08/19/2016   Procedure: Left Heart Cath and Coronary Angiography;  Surgeon: Lyn RecordsHenry W Smith, MD;  LM 30%, pLAD 50%, dLAD 100% s/p  Synergy 2.5 x 24 mm DES, RCA 20%  . CARDIAC CATHETERIZATION N/A 08/19/2016   Procedure: Coronary Stent Intervention;  Surgeon: Lyn RecordsHenry W Smith, MD;  SYNERGY DES 2.5X24 drug eluting stent  . CHOLECYSTECTOMY      Current Medications: Outpatient Medications Prior to Visit  Medication Sig Dispense Refill  . acetaminophen (TYLENOL) 500 MG tablet Take 500 mg by mouth at bedtime as needed for mild pain.     Marland Kitchen. aspirin EC 81 MG tablet Take 81 mg by mouth daily.    Marland Kitchen. atorvastatin (LIPITOR) 80 MG tablet Take 0.5 tablets (40 mg total) by mouth daily at 6 PM. 45 tablet 3  . carvedilol (COREG) 6.25 MG tablet Take 1 tablet (6.25 mg total) by mouth 2 (two) times daily with a meal. 180 tablet 3  . clopidogrel (PLAVIX) 75 MG tablet Take 75 mg by mouth daily.    Marland Kitchen. levothyroxine (SYNTHROID, LEVOTHROID) 88 MCG tablet Take 88 mcg by mouth daily before breakfast.    . loteprednol (ALREX) 0.2 % SUSP Place 1 drop into both eyes 2 (two) times daily.    . Multiple Vitamins-Minerals (OCUVITE PO) Take 1 tablet by mouth daily.    . nitroGLYCERIN (NITROSTAT) 0.4 MG SL tablet  Place 0.4 mg under the tongue every 5 (five) minutes as needed for chest pain.      No facility-administered medications prior to visit.      Allergies:   Patient has no known allergies.   Social History   Social History  . Marital status: Widowed    Spouse name: N/A  . Number of children: N/A  . Years of education: N/A   Social History Main Topics  . Smoking status: Never Smoker  . Smokeless tobacco: Never Used  . Alcohol use No  . Drug use: No  . Sexual activity: Not Currently   Other Topics Concern  . None   Social History Narrative   Lives independently     Family History:  The patient's family history includes Diabetes Mellitus II in her son.     ROS:   Please see the history  of present illness.    ROS All other systems reviewed and are negative.   PHYSICAL EXAM:   VS:  BP (!) 112/56 (BP Location: Left Arm, Patient Position: Sitting, Cuff Size: Normal)   Pulse 65   Ht 5\' 4"  (1.626 m)   Wt 151 lb (68.5 kg)   SpO2 97%   BMI 25.92 kg/m    GEN: Well nourished, well developed, in no acute distress  HEENT: normal  Neck: no JVD, carotid bruits, or masses Cardiac: RRR; no murmurs, rubs, or gallops,no edema  Respiratory:  clear to auscultation bilaterally, normal work of breathing GI: soft, nontender, nondistended, + BS MS: no deformity or atrophy  Skin: warm and dry, no rash, some resolving hematoma on right wrist.  Neuro:  Alert and Oriented x 3, Strength and sensation are intact Psych: euthymic mood, full affect  Wt Readings from Last 3 Encounters:  08/28/16 151 lb (68.5 kg)  08/20/16 150 lb 9.2 oz (68.3 kg)  10/04/13 140 lb 3.2 oz (63.6 kg)      Studies/Labs Reviewed:   EKG:  EKG is ordered today.  The ekg ordered today demonstrates chronic atrial fibrillation HR 63, anteroseptal Q waves  Recent Labs: 08/19/2016: ALT 16; Magnesium 1.7; TSH 3.835 08/20/2016: BUN 20; Creatinine, Ser 1.34; Hemoglobin 11.5; Platelets 156; Potassium 3.9; Sodium 138   Lipid Panel    Component Value Date/Time   CHOL 187 08/19/2016 0328   TRIG 52 08/19/2016 0328   HDL 64 08/19/2016 0328   CHOLHDL 2.9 08/19/2016 0328   VLDL 10 08/19/2016 0328   LDLCALC 113 (H) 08/19/2016 0328    Additional studies/ records that were reviewed today include:  CATH: 08/19/2016  Prox RCA to Dist RCA lesion, 20 %stenosed.  Prox LAD lesion, 50 %stenosed.  LM lesion, 30 %stenosed.  A STENT SYNERGY DES 2.25X32 drug eluting stent was successfully placed.  Dist LAD lesion, 100 %stenosed.  Post intervention, there is a 0% residual stenosis.  A STENT SYNERGY DES 2.5X24 drug eluting stent was successfully placed, and does not overlap previously placed stent.  Mid LAD lesion, 80  %stenosed.  Post intervention, there is a 0% residual stenosis.  There is moderate to severe left ventricular systolic dysfunction.  The left ventricular ejection fraction is 25-35% by visual estimate.  LV end diastolic pressure is moderately elevated.  Acute anterior ST elevation myocardial infarction.  Successful PCI with stenting of the mid LAD from 100% to 0% with a Synergy 2.25 x 32 drug-eluting stent with TIMI grade 3 flow.  Successful primary stenting of a segmental 80% proximal LAD stenosis to 0% using a 2.5  x 24 Synergy drug-eluting stent.  Irregularities noted in the circumflex and right coronary.  Significant anteroapical wall motion abnormality/akinesis with estimated EF 25-35% with LVEDP 24 mmHg. RECOMMENDATIONS:  Aspirin and Plavix times at least one year  Convert diltiazem to metoprolol for rate control of atrial fibrillation and in the setting of ischemic heart disease.  Follow kidney function closely as patient had pre-contrast exposure chronic kidney disease. A total of 300 cc of contrast was used. She would be at high risk for developing acute kidney injury.  If dyspnea, IV Lasix should be given.  Gentle hydration  At high risk for complications given age and morbidities.   2D ECHO: 08/20/2016 LV EF: 50% -   55% Study Conclusions - Left ventricle: The cavity size was normal. There was mild   concentric hypertrophy. Systolic function was normal. The   estimated ejection fraction was in the range of 50% to 55%. There   is akinesis of the mid anteroseptal, anterior and apical septal   and anterior walls. The study was not technically sufficient to   allow evaluation of LV diastolic dysfunction due to atrial   fibrillation. - Aortic valve: Trileaflet; mildly thickened, mildly calcified   leaflets. There was mild regurgitation. - Aortic root: The aortic root was normal in size. - Mitral valve: Severe mitral annular calcifications with moderate    calcifications of the leaflets. Mid mitral stenosis. Trivial   regurgitation. There was mild regurgitation. Mean gradient (D): 4   mm Hg. Valve area by continuity equation (using LVOT flow): 1.07   cm^2. - Left atrium: The atrium was mildly dilated. - Right ventricle: Systolic function was normal. - Tricuspid valve: There was moderate regurgitation. - Pulmonic valve: Transvalvular velocity was within the normal   range. There was no evidence for stenosis. There was trivial   regurgitation. - Pulmonary arteries: Systolic pressure was moderately increased.   PA peak pressure: 51 mm Hg (S). - Inferior vena cava: The vessel was normal in size. - Pericardium, extracardiac: There was a left pleural effusion.   ASSESSMENT & PLAN:   1. CAD: Anterior STEMI 10/28, now s/p DES x 2 to proximal and mid LAD.   - Continue ASA 81 and Plavix or now. See discussion of atrial fibrillation below, suspect we may want to drop the ASA and add Xarelto 15 daily. Will defer to Dr. Katrinka Blazing - Continue atorvastatin 40 mg daily (had diarrhea on 80mg  ).   2. Atrial fibrillation: Chronic, rate is controlled on Coreg 6.25mg  BID. She had a CVA in 2012.  - She has had atrial fibrillation for years. Refused coumadin because she did not want to go back and forth for INR checks. CHADSVASC = 7. Felt to be too high risk for triple therapy. Will defer to primary cardiologist on if she should be put on Xarlto (pre auth already approved for 15mg  daily)  3. Ischemic cardiomyopathy: EF 25-35% on LV-gram. 2D ECHO the following day showed an EF of 50-55% with akinesis of the anterior wall, mild MS, mild LAE, mod TR,  PA pressure 51.  - Continue Coreg 6.25 mg bid. No ACEI/ARB given CKD  4. CKD: Stage III:She was gently hydrated post-cath as she got a lot of contrast. Creat 1.34 at discharge. Will check BMET today  5. HTN: BPs have been well controlled. She has been taking Valsartan-HCTZ 160-12.5 since discharge (not on list  then or now.). I have asked her to discontinue this given her GFR of 34 and follow BPs.  She will call us if BPs >140/90  6. CKD: will check BMET   Medication Adjustments/Labs and Tests Ordered: Current medicines are reviewed at length with the patient today.  Concerns regarding medicines are outlined above.  Medication changes, Labs and Tests ordered today are listed in the Patient Instructions below. Patient Instructions  Medication Instructions:  Your physician recommends that you continue on your current medications as directed. Please refer to the Current Medication list given to you today.   Labwork: TODAY:  BMET  Testing/Procedures: None ordered  Follow-Up: Your physician recommends that you schedule a follow-up appointment in: 3 MONTHS WITH DR. Katrinka BlazingSMITH   Any Other Special Instructions Will Be Listed Below (If Applicable).     If you need a refill on your cardiac medications before your next appointment, please call your pharmacy.      Signed, Cline CrockKathryn Tristan Proto, PA-C  08/28/2016 10:22 AM    [facility] [address], [place], [place]  [address] Phone: [phone]) [phone]; Fax: [phone]

## 2016-08-27 ENCOUNTER — Encounter: Payer: Self-pay | Admitting: Physician Assistant

## 2016-08-28 ENCOUNTER — Encounter: Payer: Self-pay | Admitting: Physician Assistant

## 2016-08-28 ENCOUNTER — Ambulatory Visit (INDEPENDENT_AMBULATORY_CARE_PROVIDER_SITE_OTHER): Payer: Medicare Other | Admitting: Physician Assistant

## 2016-08-28 ENCOUNTER — Telehealth: Payer: Self-pay | Admitting: Physician Assistant

## 2016-08-28 VITALS — BP 112/56 | HR 65 | Ht 64.0 in | Wt 151.0 lb

## 2016-08-28 DIAGNOSIS — I482 Chronic atrial fibrillation, unspecified: Secondary | ICD-10-CM

## 2016-08-28 DIAGNOSIS — I255 Ischemic cardiomyopathy: Secondary | ICD-10-CM

## 2016-08-28 DIAGNOSIS — I2109 ST elevation (STEMI) myocardial infarction involving other coronary artery of anterior wall: Secondary | ICD-10-CM | POA: Diagnosis not present

## 2016-08-28 DIAGNOSIS — N189 Chronic kidney disease, unspecified: Secondary | ICD-10-CM | POA: Diagnosis not present

## 2016-08-28 DIAGNOSIS — I10 Essential (primary) hypertension: Secondary | ICD-10-CM

## 2016-08-28 LAB — BASIC METABOLIC PANEL
BUN: 17 mg/dL (ref 7–25)
CALCIUM: 9.3 mg/dL (ref 8.6–10.4)
CO2: 30 mmol/L (ref 20–31)
Chloride: 101 mmol/L (ref 98–110)
Creat: 1.24 mg/dL — ABNORMAL HIGH (ref 0.60–0.88)
Glucose, Bld: 102 mg/dL — ABNORMAL HIGH (ref 65–99)
Potassium: 4.5 mmol/L (ref 3.5–5.3)
SODIUM: 140 mmol/L (ref 135–146)

## 2016-08-28 NOTE — Progress Notes (Signed)
I would not use triple drug therapy. At 6 month mark, we will likely start either Eliquis or Xarelto with either aspirin or Plavix. I will need to discuss this with her.

## 2016-08-28 NOTE — Telephone Encounter (Signed)
    Discussed anticoagulation with Dr. Katrinka BlazingSmith. He stated: "I would not use triple drug therapy. At 6 month mark, we will likely start either Eliquis or Xarelto with either aspirin or Plavix. I will need to discuss this with her.".    I have discussed this with her daughter and they are in agreement.    Cline CrockKathryn Carley Strickling PA-C  [facility]

## 2016-08-28 NOTE — Patient Instructions (Addendum)
Medication Instructions:  Your physician recommends that you continue on your current medications as directed. Please refer to the Current Medication list given to you today.   Labwork: TODAY:  BMET  Testing/Procedures: None ordered  Follow-Up: Your physician recommends that you schedule a follow-up appointment in: 3 MONTHS WITH DR. SMITH   Any Other Special Instructions Will Be Listed Below (If Applicable).     If you need a refill on your cardiac medications before your next appointment, please call your pharmacy.   

## 2016-09-23 ENCOUNTER — Encounter (HOSPITAL_COMMUNITY): Payer: Self-pay | Admitting: *Deleted

## 2016-09-23 ENCOUNTER — Emergency Department (HOSPITAL_COMMUNITY): Payer: Medicare Other

## 2016-09-23 ENCOUNTER — Inpatient Hospital Stay (HOSPITAL_COMMUNITY)
Admission: EM | Admit: 2016-09-23 | Discharge: 2016-09-25 | DRG: 291 | Disposition: A | Discharge status: Home Health | Payer: Medicare Other | Attending: Family Medicine | Admitting: Family Medicine

## 2016-09-23 DIAGNOSIS — I5041 Acute combined systolic (congestive) and diastolic (congestive) heart failure: Secondary | ICD-10-CM | POA: Diagnosis present

## 2016-09-23 DIAGNOSIS — J9601 Acute respiratory failure with hypoxia: Secondary | ICD-10-CM | POA: Diagnosis present

## 2016-09-23 DIAGNOSIS — I252 Old myocardial infarction: Secondary | ICD-10-CM

## 2016-09-23 DIAGNOSIS — I130 Hypertensive heart and chronic kidney disease with heart failure and stage 1 through stage 4 chronic kidney disease, or unspecified chronic kidney disease: Secondary | ICD-10-CM | POA: Diagnosis present

## 2016-09-23 DIAGNOSIS — E876 Hypokalemia: Secondary | ICD-10-CM | POA: Diagnosis present

## 2016-09-23 DIAGNOSIS — I340 Nonrheumatic mitral (valve) insufficiency: Secondary | ICD-10-CM | POA: Diagnosis present

## 2016-09-23 DIAGNOSIS — N183 Chronic kidney disease, stage 3 unspecified: Secondary | ICD-10-CM

## 2016-09-23 DIAGNOSIS — J810 Acute pulmonary edema: Secondary | ICD-10-CM

## 2016-09-23 DIAGNOSIS — I5021 Acute systolic (congestive) heart failure: Secondary | ICD-10-CM

## 2016-09-23 DIAGNOSIS — Z7982 Long term (current) use of aspirin: Secondary | ICD-10-CM

## 2016-09-23 DIAGNOSIS — I5033 Acute on chronic diastolic (congestive) heart failure: Secondary | ICD-10-CM | POA: Diagnosis present

## 2016-09-23 DIAGNOSIS — Z8673 Personal history of transient ischemic attack (TIA), and cerebral infarction without residual deficits: Secondary | ICD-10-CM | POA: Diagnosis not present

## 2016-09-23 DIAGNOSIS — I509 Heart failure, unspecified: Secondary | ICD-10-CM | POA: Diagnosis not present

## 2016-09-23 DIAGNOSIS — T502X5A Adverse effect of carbonic-anhydrase inhibitors, benzothiadiazides and other diuretics, initial encounter: Secondary | ICD-10-CM | POA: Diagnosis not present

## 2016-09-23 DIAGNOSIS — Z79899 Other long term (current) drug therapy: Secondary | ICD-10-CM

## 2016-09-23 DIAGNOSIS — Z7902 Long term (current) use of antithrombotics/antiplatelets: Secondary | ICD-10-CM | POA: Diagnosis not present

## 2016-09-23 DIAGNOSIS — Z955 Presence of coronary angioplasty implant and graft: Secondary | ICD-10-CM | POA: Diagnosis not present

## 2016-09-23 DIAGNOSIS — Z833 Family history of diabetes mellitus: Secondary | ICD-10-CM

## 2016-09-23 DIAGNOSIS — I2510 Atherosclerotic heart disease of native coronary artery without angina pectoris: Secondary | ICD-10-CM | POA: Diagnosis present

## 2016-09-23 DIAGNOSIS — Z66 Do not resuscitate: Secondary | ICD-10-CM | POA: Diagnosis present

## 2016-09-23 DIAGNOSIS — I10 Essential (primary) hypertension: Secondary | ICD-10-CM | POA: Diagnosis not present

## 2016-09-23 DIAGNOSIS — I255 Ischemic cardiomyopathy: Secondary | ICD-10-CM | POA: Diagnosis present

## 2016-09-23 DIAGNOSIS — I482 Chronic atrial fibrillation, unspecified: Secondary | ICD-10-CM | POA: Diagnosis present

## 2016-09-23 DIAGNOSIS — E039 Hypothyroidism, unspecified: Secondary | ICD-10-CM | POA: Diagnosis present

## 2016-09-23 DIAGNOSIS — Z9049 Acquired absence of other specified parts of digestive tract: Secondary | ICD-10-CM

## 2016-09-23 HISTORY — DX: Rheumatic tricuspid insufficiency: I07.1

## 2016-09-23 HISTORY — DX: Rheumatic mitral stenosis: I05.0

## 2016-09-23 HISTORY — DX: Unspecified fall, initial encounter: W19.XXXA

## 2016-09-23 HISTORY — DX: Nonrheumatic mitral (valve) insufficiency: I34.0

## 2016-09-23 HISTORY — DX: Chronic kidney disease, stage 3 (moderate): N18.3

## 2016-09-23 HISTORY — DX: Permanent atrial fibrillation: I48.21

## 2016-09-23 HISTORY — DX: Chronic kidney disease, stage 3 unspecified: N18.30

## 2016-09-23 HISTORY — DX: Ischemic cardiomyopathy: I25.5

## 2016-09-23 LAB — CBC WITH DIFFERENTIAL/PLATELET
Basophils Absolute: 0 10*3/uL (ref 0.0–0.1)
Basophils Relative: 0 %
Eosinophils Absolute: 0.2 10*3/uL (ref 0.0–0.7)
Eosinophils Relative: 3 %
HEMATOCRIT: 37.4 % (ref 36.0–46.0)
HEMOGLOBIN: 11.5 g/dL — AB (ref 12.0–15.0)
LYMPHS ABS: 1 10*3/uL (ref 0.7–4.0)
LYMPHS PCT: 17 %
MCH: 29.6 pg (ref 26.0–34.0)
MCHC: 30.7 g/dL (ref 30.0–36.0)
MCV: 96.4 fL (ref 78.0–100.0)
Monocytes Absolute: 0.4 10*3/uL (ref 0.1–1.0)
Monocytes Relative: 6 %
NEUTROS ABS: 4.5 10*3/uL (ref 1.7–7.7)
NEUTROS PCT: 74 %
Platelets: 167 10*3/uL (ref 150–400)
RBC: 3.88 MIL/uL (ref 3.87–5.11)
RDW: 14.5 % (ref 11.5–15.5)
WBC: 6.1 10*3/uL (ref 4.0–10.5)

## 2016-09-23 LAB — BASIC METABOLIC PANEL
Anion gap: 10 (ref 5–15)
BUN: 10 mg/dL (ref 6–20)
CHLORIDE: 109 mmol/L (ref 101–111)
CO2: 24 mmol/L (ref 22–32)
Calcium: 9.1 mg/dL (ref 8.9–10.3)
Creatinine, Ser: 0.96 mg/dL (ref 0.44–1.00)
GFR calc non Af Amer: 51 mL/min — ABNORMAL LOW (ref >60)
GFR, EST AFRICAN AMERICAN: 59 mL/min — AB (ref >60)
Glucose, Bld: 148 mg/dL — ABNORMAL HIGH (ref 65–99)
POTASSIUM: 3.7 mmol/L (ref 3.5–5.1)
SODIUM: 143 mmol/L (ref 135–145)

## 2016-09-23 LAB — BRAIN NATRIURETIC PEPTIDE: B Natriuretic Peptide: 1073.4 pg/mL — ABNORMAL HIGH (ref 0.0–100.0)

## 2016-09-23 LAB — I-STAT VENOUS BLOOD GAS, ED
Acid-Base Excess: 1 mmol/L (ref 0.0–2.0)
BICARBONATE: 24.7 mmol/L (ref 20.0–28.0)
O2 Saturation: 97 %
PCO2 VEN: 35.9 mmHg — AB (ref 44.0–60.0)
PH VEN: 7.445 — AB (ref 7.250–7.430)
TCO2: 26 mmol/L (ref 0–100)
pO2, Ven: 85 mmHg — ABNORMAL HIGH (ref 32.0–45.0)

## 2016-09-23 LAB — I-STAT TROPONIN, ED: Troponin i, poc: 0.02 ng/mL (ref 0.00–0.08)

## 2016-09-23 LAB — MRSA PCR SCREENING: MRSA BY PCR: NEGATIVE

## 2016-09-23 MED ORDER — ACETAMINOPHEN 325 MG PO TABS
650.0000 mg | ORAL_TABLET | ORAL | Status: DC | PRN
  Administered 2016-09-23: 650 mg via ORAL
  Filled 2016-09-23: qty 2

## 2016-09-23 MED ORDER — CARVEDILOL 6.25 MG PO TABS
6.2500 mg | ORAL_TABLET | Freq: Two times a day (BID) | ORAL | Status: DC
  Administered 2016-09-23 – 2016-09-25 (×6): 6.25 mg via ORAL
  Filled 2016-09-23 (×6): qty 1

## 2016-09-23 MED ORDER — POTASSIUM CHLORIDE CRYS ER 20 MEQ PO TBCR
40.0000 meq | EXTENDED_RELEASE_TABLET | Freq: Two times a day (BID) | ORAL | Status: DC
  Administered 2016-09-23: 20 meq via ORAL
  Filled 2016-09-23: qty 2

## 2016-09-23 MED ORDER — SODIUM CHLORIDE 0.9% FLUSH
3.0000 mL | Freq: Two times a day (BID) | INTRAVENOUS | Status: DC
  Administered 2016-09-23 – 2016-09-25 (×4): 3 mL via INTRAVENOUS

## 2016-09-23 MED ORDER — FUROSEMIDE 10 MG/ML IJ SOLN
40.0000 mg | Freq: Two times a day (BID) | INTRAMUSCULAR | Status: DC
  Administered 2016-09-23 – 2016-09-24 (×2): 40 mg via INTRAVENOUS
  Filled 2016-09-23 (×2): qty 4

## 2016-09-23 MED ORDER — ATORVASTATIN CALCIUM 40 MG PO TABS
40.0000 mg | ORAL_TABLET | Freq: Every day | ORAL | Status: DC
  Administered 2016-09-23: 40 mg via ORAL
  Filled 2016-09-23: qty 1

## 2016-09-23 MED ORDER — CLOPIDOGREL BISULFATE 75 MG PO TABS
75.0000 mg | ORAL_TABLET | Freq: Every day | ORAL | Status: DC
  Administered 2016-09-23 – 2016-09-25 (×3): 75 mg via ORAL
  Filled 2016-09-23 (×3): qty 1

## 2016-09-23 MED ORDER — LOTEPREDNOL ETABONATE 0.5 % OP SUSP
1.0000 [drp] | Freq: Two times a day (BID) | OPHTHALMIC | Status: DC
  Administered 2016-09-23 – 2016-09-25 (×4): 1 [drp] via OPHTHALMIC
  Filled 2016-09-23: qty 5

## 2016-09-23 MED ORDER — SODIUM CHLORIDE 0.9 % IV SOLN: 250.0000 mL | INTRAVENOUS | Status: DC | PRN

## 2016-09-23 MED ORDER — POTASSIUM CHLORIDE 20 MEQ PO PACK
40.0000 meq | PACK | Freq: Two times a day (BID) | ORAL | Status: DC
  Administered 2016-09-24 – 2016-09-25 (×3): 40 meq via ORAL
  Filled 2016-09-23 (×5): qty 2

## 2016-09-23 MED ORDER — ALPRAZOLAM 0.25 MG PO TABS
0.2500 mg | ORAL_TABLET | Freq: Three times a day (TID) | ORAL | Status: DC | PRN
  Administered 2016-09-24: 0.25 mg via ORAL
  Filled 2016-09-23: qty 1

## 2016-09-23 MED ORDER — POTASSIUM CHLORIDE 20 MEQ PO PACK
20.0000 meq | PACK | Freq: Once | ORAL | Status: AC
  Administered 2016-09-23: 20 meq via ORAL
  Filled 2016-09-23 (×2): qty 1

## 2016-09-23 MED ORDER — SODIUM CHLORIDE 0.9% FLUSH: 3.0000 mL | INTRAVENOUS | Status: DC | PRN

## 2016-09-23 MED ORDER — FUROSEMIDE 10 MG/ML IJ SOLN
40.0000 mg | Freq: Once | INTRAMUSCULAR | Status: AC
  Administered 2016-09-23: 40 mg via INTRAVENOUS
  Filled 2016-09-23: qty 4

## 2016-09-23 MED ORDER — NITROGLYCERIN 2 % TD OINT
0.5000 [in_us] | TOPICAL_OINTMENT | Freq: Four times a day (QID) | TRANSDERMAL | Status: DC
  Administered 2016-09-23 – 2016-09-25 (×8): 0.5 [in_us] via TOPICAL
  Filled 2016-09-23 (×2): qty 1
  Filled 2016-09-23: qty 30

## 2016-09-23 MED ORDER — ONDANSETRON HCL 4 MG/2ML IJ SOLN: 4.0000 mg | Freq: Four times a day (QID) | INTRAMUSCULAR | Status: DC | PRN

## 2016-09-23 MED ORDER — LEVOTHYROXINE SODIUM 88 MCG PO TABS
88.0000 ug | ORAL_TABLET | Freq: Every day | ORAL | Status: DC
  Administered 2016-09-24 – 2016-09-25 (×2): 88 ug via ORAL
  Filled 2016-09-23 (×2): qty 1

## 2016-09-23 MED ORDER — ENOXAPARIN SODIUM 40 MG/0.4ML ~~LOC~~ SOLN
40.0000 mg | SUBCUTANEOUS | Status: DC
  Administered 2016-09-23 – 2016-09-24 (×2): 40 mg via SUBCUTANEOUS
  Filled 2016-09-23 (×2): qty 0.4

## 2016-09-23 MED ORDER — ASPIRIN EC 81 MG PO TBEC
81.0000 mg | DELAYED_RELEASE_TABLET | Freq: Every day | ORAL | Status: DC
  Administered 2016-09-23 – 2016-09-25 (×3): 81 mg via ORAL
  Filled 2016-09-23 (×3): qty 1

## 2016-09-23 MED ORDER — LORAZEPAM 2 MG/ML IJ SOLN
0.5000 mg | Freq: Once | INTRAMUSCULAR | Status: AC
  Administered 2016-09-23: 0.5 mg via INTRAVENOUS
  Filled 2016-09-23: qty 1

## 2016-09-23 NOTE — H&P (Signed)
History and Physical  Chelsea NapKathleen L Gorsline ZOX:[id number] DOB: 05/19/1924 DOA: 09/23/2016  Referring physician: Arthor CaptainAbigail Harris, PA-C, ED provider PCP: Enrique SackGREEN, EDWIN JAY, MD  Outpatient Specialists:   Cardiologist: Dr. Katrinka BlazingSmith  Chief Complaint: shortness of breath  HPI: Chelsea Fuller is a 82 y.o. female with a history of hypertension, hypothyroidism, CAD status post anterior STEMI with 2 drug-eluting stents to the LAD placed in 07/2016, history of CVA in 2010.Marland Kitchen. Patient has been recovering well until 5-6 days ago patient started having shortness of breath worsening with exertion and improved with rest, however the patient became more short of breath at rest. She is not normally on oxygen at home. EMS placed her on 4 L nasal cannula to bring her oxygen saturations 96%.   Emergency Department Course: Due to her work of breathing, the patient was placed on BiPAP with good results. Additionally, her initial blood pressure was 198/159. This is also decreased with her carvedilol and supporting her breathing. Labs show a BNP of 1073. Creatinine is 0.96. White count is 6.1. Potassium is 3.7. Chest x-ray shows pulmonary congestion with pulmonary edema with curly B lines.  Review of Systems:   Pt denies any fevers, chills, nausea, vomiting, diarrhea, constipation, abdominal pain, cough, wheezing, palpitations, headache, vision changes, lightheadedness, dizziness, melena, rectal bleeding.  Review of systems are otherwise negative  Past Medical History:  Diagnosis Date  . CAD (coronary artery disease)    a. 07/2016: anterior STEMI s/p DES x2 to LAD  . CVA (cerebral infarction)   . Hypertension   . Hypothyroidism    Past Surgical History:  Procedure Laterality Date  . CARDIAC CATHETERIZATION N/A 08/19/2016   Procedure: Left Heart Cath and Coronary Angiography;  Surgeon: Lyn RecordsHenry W Smith, MD;  LM 30%, pLAD 50%, dLAD 100% s/p  Synergy 2.5 x 24 mm DES, RCA 20%  . CARDIAC CATHETERIZATION N/A 08/19/2016   Procedure: Coronary Stent Intervention;  Surgeon: Lyn RecordsHenry W Smith, MD;  SYNERGY DES 2.5X24 drug eluting stent  . CHOLECYSTECTOMY     Social History:  reports that she has never smoked. She has never used smokeless tobacco. She reports that she does not drink alcohol or use drugs. Patient lives at Home  No Known Allergies  Family History  Problem Relation Age of Onset  . Diabetes Mellitus II Son       Prior to Admission medications   Medication Sig Start Date End Date Taking? Authorizing Provider  acetaminophen (TYLENOL) 500 MG tablet Take 500 mg by mouth at bedtime as needed for mild pain.    Yes Historical Provider, MD  aspirin EC 81 MG tablet Take 81 mg by mouth daily.   Yes Historical Provider, MD  atorvastatin (LIPITOR) 80 MG tablet Take 0.5 tablets (40 mg total) by mouth daily at 6 PM. 08/23/16  Yes Jake BatheMark C Skains, MD  carvedilol (COREG) 6.25 MG tablet Take 1 tablet (6.25 mg total) by mouth 2 (two) times daily with a meal. 08/20/16  Yes Janetta HoraKathryn R Thompson, PA-C  clopidogrel (PLAVIX) 75 MG tablet Take 75 mg by mouth daily.   Yes Historical Provider, MD  levothyroxine (SYNTHROID, LEVOTHROID) 88 MCG tablet Take 88 mcg by mouth daily before breakfast.   Yes Historical Provider, MD  loteprednol (ALREX) 0.2 % SUSP Place 1 drop into both eyes 2 (two) times daily.   Yes Historical Provider, MD  Multiple Vitamins-Minerals (OCUVITE PO) Take 1 tablet by mouth daily.   Yes Historical Provider, MD  nitroGLYCERIN (NITROSTAT) 0.4 MG SL tablet Place  0.4 mg under the tongue every 5 (five) minutes as needed for chest pain.  07/29/16  Yes Historical Provider, MD    Physical Exam: BP 160/68   Pulse 77   Temp 98 F (36.7 C) (Oral)   Resp 24   Ht 5\' 4"  (1.626 m)   Wt 68 kg (150 lb)   SpO2 96%   BMI 25.75 kg/m   General: Elderly Caucasian female. Awake and alert and oriented x3. No acute cardiopulmonary distress.  HEENT: Normocephalic atraumatic.  Right and left ears normal in appearance.  Pupils  equal, round, reactive to light. Extraocular muscles are intact. Sclerae anicteric and noninjected.  Moist mucosal membranes. No mucosal lesions.  Neck: Neck supple without lymphadenopathy. No carotid bruits. No masses palpated.  Cardiovascular: Irregular rate with normal S1-S2 sounds. No murmurs, rubs, gallops auscultated. JVD to approximately 8 cm.  Respiratory: Rales in lung fields bilaterally to mid lung field. Poor air movement. Abdomen: Soft, nontender, nondistended. Active bowel sounds. No masses or hepatosplenomegaly  Skin: No rashes, lesions, or ulcerations.  Dry, warm to touch. 2+ dorsalis pedis and radial pulses. Musculoskeletal: No calf or leg pain. All major joints not erythematous nontender.  No upper or lower joint deformation.  Good ROM.  No contractures  Psychiatric: Intact judgment and insight. Pleasant and cooperative. Neurologic: No focal neurological deficits. Strength is 5/5 and symmetric in upper and lower extremities.  Cranial nerves II through XII are grossly intact.           Labs on Admission: I have personally reviewed following labs and imaging studies  CBC:  Recent Labs Lab 09/23/16 0926  WBC 6.1  NEUTROABS 4.5  HGB 11.5*  HCT 37.4  MCV 96.4  PLT 167   Basic Metabolic Panel:  Recent Labs Lab 09/23/16 0926  NA 143  K 3.7  CL 109  CO2 24  GLUCOSE 148*  BUN 10  CREATININE 0.96  CALCIUM 9.1   GFR: Estimated Creatinine Clearance: 37.6 mL/min (by C-G formula based on SCr of 0.96 mg/dL). Liver Function Tests: No results for input(s): AST, ALT, ALKPHOS, BILITOT, PROT, ALBUMIN in the last 168 hours. No results for input(s): LIPASE, AMYLASE in the last 168 hours. No results for input(s): AMMONIA in the last 168 hours. Coagulation Profile: No results for input(s): INR, PROTIME in the last 168 hours. Cardiac Enzymes: No results for input(s): CKTOTAL, CKMB, CKMBINDEX, TROPONINI in the last 168 hours. BNP (last 3 results) No results for input(s):  PROBNP in the last 8760 hours. HbA1C: No results for input(s): HGBA1C in the last 72 hours. CBG: No results for input(s): GLUCAP in the last 168 hours. Lipid Profile: No results for input(s): CHOL, HDL, LDLCALC, TRIG, CHOLHDL, LDLDIRECT in the last 72 hours. Thyroid Function Tests: No results for input(s): TSH, T4TOTAL, FREET4, T3FREE, THYROIDAB in the last 72 hours. Anemia Panel: No results for input(s): VITAMINB12, FOLATE, FERRITIN, TIBC, IRON, RETICCTPCT in the last 72 hours. Urine analysis:    Component Value Date/Time   COLORURINE YELLOW 11/18/2008 1705   APPEARANCEUR HAZY (A) 11/18/2008 1705   LABSPEC 1.023 11/18/2008 1705   PHURINE 6.5 11/18/2008 1705   GLUCOSEU NEGATIVE 11/18/2008 1705   HGBUR MODERATE (A) 11/18/2008 1705   BILIRUBINUR NEGATIVE 11/18/2008 1705   KETONESUR NEGATIVE 11/18/2008 1705   PROTEINUR 30 (A) 11/18/2008 1705   UROBILINOGEN 1.0 11/18/2008 1705   NITRITE NEGATIVE 11/18/2008 1705   LEUKOCYTESUR SMALL (A) 11/18/2008 1705   Sepsis Labs: @LABRCNTIP (procalcitonin:4,lacticidven:4) )No results found for this or any  previous visit (from the past 240 hour(s)).   Radiological Exams on Admission: Dg Chest Port 1 View  Result Date: 09/23/2016 CLINICAL DATA:  82 year old female with history of dyspnea. EXAM: PORTABLE CHEST 1 VIEW COMPARISON:  Chest x-ray 08/19/2016. FINDINGS: Lung volumes are low. Bibasilar opacities may reflect areas of atelectasis and/or consolidation. Small bilateral pleural effusions. No evidence of pulmonary edema. Heart size is mildly enlarged. Upper mediastinal contours are within normal limits. Atherosclerosis in the thoracic aorta. IMPRESSION: 1. Low lung volumes with bibasilar atelectasis and/or consolidation with small bilateral pleural effusions. 2. Mild cardiomegaly. 3. Atherosclerosis. Electronically Signed   By: Trudie Reed M.D.   On: 09/23/2016 10:04    EKG: Independently reviewed. Atrial fibrillation. Old anterolateral  infarct. No acute ST changes  Assessment/Plan: Principal Problem:   Acute respiratory failure with hypoxia (HCC) Active Problems:   HTN (hypertension)   Hypothyroidism   Chronic atrial fibrillation (HCC)   Acute systolic heart failure (HCC)    This patient was discussed with the ED physician, including pertinent vitals, physical exam findings, labs, and imaging.  We also discussed care given by the ED provider.  #1 acute respiratory failure with hypoxia  Admit to stepdown  Continue BiPAP  ABG this afternoon  We'll wean as able #2 acute systolic heart failure  Telemetry monitoring  Strict I/O  Daily Weights  Diuresis: Lasix 40 mg IV  Potassium: 40 mEq twice a day by mouth  Echo cardiac exam tomorrow  Repeat BMP tomorrow #3 chronic atrial fibrillation  Despite patient's history, cardiology this hasn't placed the patient on triple anticoagulation  Continue aspirin and Plavix #4 hypertension  Continue carvedilol #5 hypothyroidism  Continue Synthroid #6 coronary artery disease  Continue Plavix and aspirin  DVT prophylaxis: Lovenox Consultants: None Code Status: DO NOT RESUSCITATE Family Communication: None  Disposition Plan: Patient should be able to return home following admission   Levie Heritage, DO [facility] Pager [phone]  If 7PM-7AM, please contact night-coverage [email or web address] Password [email or web address]

## 2016-09-23 NOTE — ED Notes (Signed)
Spoke with RT to place patient on bipap

## 2016-09-23 NOTE — ED Notes (Signed)
Attempted report x1. 

## 2016-09-23 NOTE — ED Notes (Signed)
Attempted report x 2 

## 2016-09-23 NOTE — Progress Notes (Signed)
Received report from Chelsea Fuller in ED.  Patient brought to room, vital signs stable patient tolerating 3L [place].  Please see EPIC for further documentation.  Patient oriented to room, bed rails up times X 3, call bell placed within reach.  Patient instructed not to get out of bed without calling for help

## 2016-09-23 NOTE — ED Provider Notes (Signed)
[facility]-EMERGENCY DEPT Provider Note   CSN: [id number] Arrival date & time: 09/23/16  0848     History   Chief Complaint Chief Complaint  Patient presents with  . Shortness of Breath    HPI Jolyn NapKathleen L Chizek is a 82 y.o. female who presents the emergency department with shortness of breath. The patient had an anterior wall myocardial infarction in October 2017. She has a history of atrial fibrillation, is on Plavix and is rate controlled upon arrival. The patient states that she has had worsening shortness of breath over the past several days. She has been coughing up a clear frothy sputum. She's noticed some minimal bilateral edema in the ankles, but denies any weight gain. She has no previous history of pulmonary edema and edema or CHF. Denies a fever, cough of productive productive of sputum. She states this morning she was lying down in bed and awoke gasping for air. She walked to the bathroom and was so short of breath that she came to the hospital. The patient is noted to be hypertensive upon arrival and denies taking her normal hypertensive medications prior to arrival.  HPI  Past Medical History:  Diagnosis Date  . CAD (coronary artery disease)    a. 07/2016: anterior STEMI s/p DES x2 to LAD  . CVA (cerebral infarction)   . Hypertension   . Hypothyroidism     Patient Active Problem List   Diagnosis Date Noted  . Anterior wall myocardial infarction (HCC)   . Cardiomyopathy, ischemic   . Chronic atrial fibrillation (HCC)   . Epistaxis 10/04/2013  . Anemia 10/04/2013  . Nasal fracture 10/04/2013  . Paroxysmal atrial fibrillation (HCC) 10/04/2013  . History of CVA (cerebrovascular accident) 10/04/2013  . HTN (hypertension) 10/04/2013  . Hypothyroidism 10/04/2013    Past Surgical History:  Procedure Laterality Date  . CARDIAC CATHETERIZATION N/A 08/19/2016   Procedure: Left Heart Cath and Coronary Angiography;  Surgeon: Lyn RecordsHenry W Smith, MD;  LM 30%, pLAD 50%, dLAD 100%  s/p  Synergy 2.5 x 24 mm DES, RCA 20%  . CARDIAC CATHETERIZATION N/A 08/19/2016   Procedure: Coronary Stent Intervention;  Surgeon: Lyn RecordsHenry W Smith, MD;  SYNERGY DES 2.5X24 drug eluting stent  . CHOLECYSTECTOMY      OB History    No data available       Home Medications    Prior to Admission medications   Medication Sig Start Date End Date Taking? Authorizing Provider  acetaminophen (TYLENOL) 500 MG tablet Take 500 mg by mouth at bedtime as needed for mild pain.     Historical Provider, MD  aspirin EC 81 MG tablet Take 81 mg by mouth daily.    Historical Provider, MD  atorvastatin (LIPITOR) 80 MG tablet Take 0.5 tablets (40 mg total) by mouth daily at 6 PM. 08/23/16   Jake BatheMark C Skains, MD  carvedilol (COREG) 6.25 MG tablet Take 1 tablet (6.25 mg total) by mouth 2 (two) times daily with a meal. 08/20/16   Janetta HoraKathryn R Thompson, PA-C  clopidogrel (PLAVIX) 75 MG tablet Take 75 mg by mouth daily.    Historical Provider, MD  levothyroxine (SYNTHROID, LEVOTHROID) 88 MCG tablet Take 88 mcg by mouth daily before breakfast.    Historical Provider, MD  loteprednol (ALREX) 0.2 % SUSP Place 1 drop into both eyes 2 (two) times daily.    Historical Provider, MD  Multiple Vitamins-Minerals (OCUVITE PO) Take 1 tablet by mouth daily.    Historical Provider, MD  nitroGLYCERIN (NITROSTAT) 0.4 MG SL  tablet Place 0.4 mg under the tongue every 5 (five) minutes as needed for chest pain.  07/29/16   Historical Provider, MD    Family History Family History  Problem Relation Age of Onset  . Diabetes Mellitus II Son     Social History Social History  Substance Use Topics  . Smoking status: Never Smoker  . Smokeless tobacco: Never Used  . Alcohol use No     Allergies   Patient has no known allergies.   Review of Systems Review of Systems Ten systems reviewed and are negative for acute change, except as noted in the HPI.    Physical Exam Updated Vital Signs BP (!) 157/126 (BP Location: Left Arm)    Pulse 94   Temp 98 F (36.7 C) (Oral)   Resp 24   Ht 5\' 4"  (1.626 m)   Wt 68 kg   SpO2 100%   BMI 25.75 kg/m   Physical Exam  Constitutional: She is oriented to person, place, and time. She appears well-developed and well-nourished. No distress.  HENT:  Head: Normocephalic and atraumatic.  Eyes: Conjunctivae are normal. No scleral icterus.  Neck: Normal range of motion.  Cardiovascular:  Unable to assess JVD secondary to body habitus Rate controlled irregularly irregular rhythm Distant heart sounds  1+ bilateral pitting edema  Pulmonary/Chest: She has rales.  Dyspneic at rest. Poor air movement, crackles heard, however, minimal breath sounds are heard best at the apices of both lungs.  Abdominal: Soft. Bowel sounds are normal. She exhibits no distension and no mass. There is no tenderness. There is no guarding.  Neurological: She is alert and oriented to person, place, and time.  Skin: Skin is warm and dry. She is not diaphoretic.  Nursing note and vitals reviewed.    ED Treatments / Results  Labs (all labs ordered are listed, but only abnormal results are displayed) Labs Reviewed  BASIC METABOLIC PANEL - Abnormal; Notable for the following:       Result Value   Glucose, Bld 148 (*)    GFR calc non Af Amer 51 (*)    GFR calc Af Amer 59 (*)    All other components within normal limits  CBC WITH DIFFERENTIAL/PLATELET - Abnormal; Notable for the following:    Hemoglobin 11.5 (*)    All other components within normal limits  BRAIN NATRIURETIC PEPTIDE - Abnormal; Notable for the following:    B Natriuretic Peptide 1,073.4 (*)    All other components within normal limits  I-STAT VENOUS BLOOD GAS, ED - Abnormal; Notable for the following:    pH, Ven 7.445 (*)    pCO2, Ven 35.9 (*)    pO2, Ven 85.0 (*)    All other components within normal limits  BLOOD GAS, VENOUS  I-STAT TROPOININ, ED    EKG  EKG Interpretation None       Radiology No results  found.  Procedures .Critical Care Performed by: Arthor CaptainHARRIS, Shye Doty Authorized by: Arthor CaptainHARRIS, Yolande Skoda   Critical care provider statement:    Critical care time (minutes):  40   Critical care was necessary to treat or prevent imminent or life-threatening deterioration of the following conditions:  Respiratory failure   Critical care was time spent personally by me on the following activities:  Development of treatment plan with patient or surrogate, discussions with consultants, evaluation of patient's response to treatment, examination of patient, interpretation of cardiac output measurements, obtaining history from patient or surrogate, ordering and performing treatments and interventions, ordering and  review of laboratory studies, ordering and review of radiographic studies, pulse oximetry, re-evaluation of patient's condition and review of old charts   (including critical care time)  Medications Ordered in ED Medications - No data to display   Initial Impression / Assessment and Plan / ED Course  I have reviewed the triage vital signs and the nursing notes.  Pertinent labs & imaging results that were available during my care of the patient were reviewed by me and considered in my medical decision making (see chart for details).  Clinical Course as of Sep 24 1043  Sun Sep 23, 2016  1006 Patient is not hypercarbic, very dyspneic at rest. Will place on bipap and turn off 02.  [AH]    Clinical Course User Index [AH] Arthor Captain, PA-C   Patient with new onset CHF, pulmonary edema. Tolerating BiPAP well without an oxygen requirement. I have ordered 40 mg IV Lasix as she is Lasix nave. Her BNP is highly elevated. She will be admitted to stepdown unit. Final diagnoses:  None    New Prescriptions New Prescriptions   No medications on file     Arthor Captain, PA-C 09/23/16 1120    Laurence Spates, MD 09/23/16 1447

## 2016-09-23 NOTE — ED Triage Notes (Addendum)
Patient comes in per [facility] with c/o SOB that's worsened over the last 2-3 days. Patient is short of breath with exertion. Not on home O2. Currently patient is on 4L with EMS. 96%, 148/86, 20 RR, afib 90. fsbs 186. EMS 20 in LAC. Hx of MI, COPD, HTN, afib. Patient has no complaints of chest pain.

## 2016-09-24 ENCOUNTER — Other Ambulatory Visit (HOSPITAL_COMMUNITY): Payer: Medicare Other

## 2016-09-24 ENCOUNTER — Encounter (HOSPITAL_COMMUNITY): Payer: Self-pay | Admitting: Physician Assistant

## 2016-09-24 DIAGNOSIS — N183 Chronic kidney disease, stage 3 unspecified: Secondary | ICD-10-CM

## 2016-09-24 DIAGNOSIS — I10 Essential (primary) hypertension: Secondary | ICD-10-CM

## 2016-09-24 DIAGNOSIS — I2510 Atherosclerotic heart disease of native coronary artery without angina pectoris: Secondary | ICD-10-CM

## 2016-09-24 DIAGNOSIS — I5041 Acute combined systolic (congestive) and diastolic (congestive) heart failure: Secondary | ICD-10-CM

## 2016-09-24 LAB — BASIC METABOLIC PANEL
Anion gap: 10 (ref 5–15)
BUN: 10 mg/dL (ref 6–20)
CO2: 30 mmol/L (ref 22–32)
CREATININE: 1.1 mg/dL — AB (ref 0.44–1.00)
Calcium: 8.7 mg/dL — ABNORMAL LOW (ref 8.9–10.3)
Chloride: 101 mmol/L (ref 101–111)
GFR calc Af Amer: 50 mL/min — ABNORMAL LOW (ref >60)
GFR, EST NON AFRICAN AMERICAN: 43 mL/min — AB (ref >60)
GLUCOSE: 117 mg/dL — AB (ref 65–99)
POTASSIUM: 3.2 mmol/L — AB (ref 3.5–5.1)
Sodium: 141 mmol/L (ref 135–145)

## 2016-09-24 MED ORDER — FUROSEMIDE 10 MG/ML IJ SOLN
40.0000 mg | Freq: Two times a day (BID) | INTRAMUSCULAR | Status: AC
  Administered 2016-09-24: 40 mg via INTRAVENOUS
  Filled 2016-09-24: qty 4

## 2016-09-24 MED ORDER — ATORVASTATIN CALCIUM 40 MG PO TABS
40.0000 mg | ORAL_TABLET | Freq: Two times a day (BID) | ORAL | Status: DC
  Administered 2016-09-24 – 2016-09-25 (×3): 40 mg via ORAL
  Filled 2016-09-24 (×3): qty 1

## 2016-09-24 NOTE — Progress Notes (Signed)
Report called to receiving RN.  Vital signs stable prior to transfer, all belongings and bedside medications transferred with patient.  Patients daughter Mosetta Anisnn McDonald was called and message left informing her of patient transfer to [address] room 4.

## 2016-09-24 NOTE — Progress Notes (Signed)
Patient was drinking coffee provided by nurse

## 2016-09-24 NOTE — Care Management Note (Signed)
Case Management Note  Patient Details  Name: Jolyn NapKathleen L Rendall MRN: [id number] Date of Birth: 03/18/1924  Subjective/Objective:    Adm w resp failure                Action/Plan: lives at home, pcp dr ed green   Expected Discharge Date:                  Expected Discharge Plan:  Home w Home Health Services  In-House Referral:     Discharge planning Services  CM Consult  Post Acute Care Choice:    Choice offered to:     DME Arranged:    DME Agency:     HH Arranged:    HH Agency:     Status of Service:  In process, will continue to follow  If discussed at Long Length of Stay Meetings, dates discussed:    Additional Comments: will moniter for dc needs as pt progresses.  Hanley Haysowell, Leylah Tarnow T, RN 09/24/2016, 9:54 AM

## 2016-09-24 NOTE — Progress Notes (Signed)
PROGRESS NOTE  Chelsea NapKathleen L Mcglaughlin  ZOX:[id number] DOB: 08/12/1924 DOA: 09/23/2016 PCP: Enrique SackGREEN, EDWIN JAY, MD  Outpatient Specialists:  Cardiologist: Dr. Katrinka BlazingSmith  Brief Narrative: Chelsea Fuller is an 82 y.o. female with a history of CAD s/p DESx2 to LAD for STEMI 07/2016, h/o CVA, HTN, hypothyroidism. She has been experiencing dyspnea on exertion and at rest for 5 days. She is not normally on oxygen at home. Due to her work of breathing on arrival, the patient was placed on BiPAP with good results. Additionally, her initial blood pressure was 198/159. This is also decreased with her carvedilol and supporting her breathing. Labs show a BNP of 1073. Creatinine is 0.96. CXR showed pulmonary congestion and edema. She was admitted to SDU and given IV lasix with improvement. She's down 9 lbs and weaned to 2L oxygen after 3L UOP.   Assessment & Plan: Principal Problem:   Acute respiratory failure with hypoxia (HCC) Active Problems:   HTN (hypertension)   Hypothyroidism   Chronic atrial fibrillation (HCC)   Acute systolic heart failure (HCC)  Acute respiratory failure with hypoxia:  - Stable, weaned to [place] >> transfer to telemetry - Continue diuresis given crackles on exam  Acute systolic heart failure: EF 25% at cath, though this was followed by echo the next day with EF 50-55% with anterior wall akinesis. BNP 1,073. Wt 150lbs on admission (was 151lbs at follow up w/cardiology).  - Daily wt, strict I/O, down 9lbs and 3L still with crackles and O2 requirement (new) - Lasix 40mg  IV BID ordered, will await cardiology recommendations.  - Repeat echo ordered - Monitor BMP, Mild Cr bump 0.96 > 1.1  CAD: s/p recent DES x2 to LAD for anterior STEMI: Troponin neg, no chest pain, ECG unchanged. - Continue plavix and aspirin x 1 year, after which NOAC may be considered  CKD:  - No ACE/ARB - Monitor BMP   Chronic atrial fibrillation: Rate controlled. CHADSVASc 8, has declined labwork required for  coumadin, and is felt to be too high risk for bleeding with DAPT + OAC. - Continue aspirin and Plavix - Continue coreg - Replete K, check Mg for goal > 2.   HTN: Chronic, stable - Continue carvedilol  Hypothyroidism: Chronic, stable, recent TSH 3.835 - Continue synthroid  Hypokalemia: Acute, due to diuresis.  - Repleting by mouth, recheck in AM  DVT prophylaxis: Lovenox Code Status: DNR Family Communication: None at bedside Disposition Plan: Anticipate DC home once off oxygen.   Consultants:   Cardiology  Procedures:   Echocardiogram ordered  Antimicrobials:  None   Subjective: Pt with improvement in breathing this morning. No chest pain, palpitations. Has only gotten up to bedside chair.   Objective: Vitals:   09/23/16 2332 09/24/16 0348 09/24/16 0748 09/24/16 1118  BP: 125/68 126/74 (!) 155/79 115/84  Pulse: (!) 108 (!) 54 76 70  Resp: 20 17 (!) 22 19  Temp: 98.4 F (36.9 C) 97.6 F (36.4 C) 98.2 F (36.8 C) 97.3 F (36.3 C)  TempSrc: Oral Oral Oral Oral  SpO2: 97% 97% 96% 100%  Weight:  64.4 kg (141 lb 15.6 oz)    Height:        Intake/Output Summary (Last 24 hours) at 09/24/16 1230 Last data filed at 09/24/16 1118  Gross per 24 hour  Intake              540 ml  Output             2925 ml  Net            -  2385 ml   Filed Weights   09/23/16 0856 09/23/16 1645 09/24/16 0348  Weight: 68 kg (150 lb) 64.6 kg (142 lb 6.4 oz) 64.4 kg (141 lb 15.6 oz)    Examination: General exam: Pleasant, elderly female in no distress  Respiratory system: Non-labored breathing 2L by nasal cannula. Bibasilar crackles without wheezing.  Cardiovascular system: Irregular. No murmur, rub, or gallop. No JVD, and no pedal edema. Gastrointestinal system: Abdomen soft, non-tender, non-distended, with normoactive bowel sounds. No organomegaly or masses felt. Central nervous system: Alert and oriented. No focal neurological deficits. Extremities: Warm, no deformities Skin: No  rashes, lesions no ulcers Psychiatry: Judgement and insight appear normal. Mood & affect appropriate.   Data Reviewed: I have personally reviewed following labs and imaging studies  CBC:  Recent Labs Lab 09/23/16 0926  WBC 6.1  NEUTROABS 4.5  HGB 11.5*  HCT 37.4  MCV 96.4  PLT 167   Basic Metabolic Panel:  Recent Labs Lab 09/23/16 0926 09/24/16 0204  NA 143 141  K 3.7 3.2*  CL 109 101  CO2 24 30  GLUCOSE 148* 117*  BUN 10 10  CREATININE 0.96 1.10*  CALCIUM 9.1 8.7*   GFR: Estimated Creatinine Clearance: 29.9 mL/min (by C-G formula based on SCr of 1.1 mg/dL (H)).  Urine analysis:    Component Value Date/Time   COLORURINE YELLOW 11/18/2008 1705   APPEARANCEUR HAZY (A) 11/18/2008 1705   LABSPEC 1.023 11/18/2008 1705   PHURINE 6.5 11/18/2008 1705   GLUCOSEU NEGATIVE 11/18/2008 1705   HGBUR MODERATE (A) 11/18/2008 1705   BILIRUBINUR NEGATIVE 11/18/2008 1705   KETONESUR NEGATIVE 11/18/2008 1705   PROTEINUR 30 (A) 11/18/2008 1705   UROBILINOGEN 1.0 11/18/2008 1705   NITRITE NEGATIVE 11/18/2008 1705   LEUKOCYTESUR SMALL (A) 11/18/2008 1705   Radiology Studies: Dg Chest Port 1 View  Result Date: 09/23/2016 CLINICAL DATA:  82 year old female with history of dyspnea. EXAM: PORTABLE CHEST 1 VIEW COMPARISON:  Chest x-ray 08/19/2016. FINDINGS: Lung volumes are low. Bibasilar opacities may reflect areas of atelectasis and/or consolidation. Small bilateral pleural effusions. No evidence of pulmonary edema. Heart size is mildly enlarged. Upper mediastinal contours are within normal limits. Atherosclerosis in the thoracic aorta. IMPRESSION: 1. Low lung volumes with bibasilar atelectasis and/or consolidation with small bilateral pleural effusions. 2. Mild cardiomegaly. 3. Atherosclerosis. Electronically Signed   By: Trudie Reedaniel  Entrikin M.D.   On: 09/23/2016 10:04   Scheduled Meds: . aspirin EC  81 mg Oral Daily  . atorvastatin  40 mg Oral BID  . carvedilol  6.25 mg Oral BID WC    . clopidogrel  75 mg Oral Daily  . enoxaparin (LOVENOX) injection  40 mg Subcutaneous Q24H  . furosemide  40 mg Intravenous BID  . levothyroxine  88 mcg Oral QAC breakfast  . loteprednol  1 drop Both Eyes BID  . nitroGLYCERIN  0.5 inch Topical Q6H  . potassium chloride  40 mEq Oral BID  . sodium chloride flush  3 mL Intravenous Q12H   Continuous Infusions:   LOS: 1 day   Time spent: 25 minutes.  Hazeline Junkeryan Evin Chirco, MD [facility] Pager [phone]  If 7PM-7AM, please contact night-coverage [email or web address] Password [email or web address] 09/24/2016, 12:30 PM

## 2016-09-24 NOTE — Consult Note (Signed)
Cardiology Consultation Note    Patient ID: Chelsea Fuller, MRN: [id number], DOB/AGE: 80/02/1924 80 y.o. Admit date: 09/23/2016   Date of Consult: 09/24/2016 Primary Physician: Enrique SackGREEN, EDWIN JAY, MD Primary Cardiologist: Dr. Verdis PrimeHenry Smith  Chief Complaint: SOB Reason for Consultation: CHF Requesting MD: Dr. Jarvis NewcomerGrunz  HPI: Chelsea Fuller is a 82 y.o. female with history of CAD (recent anterior STEMI 07/2016 s/p DES to mLAD & pLAD), ischemic cardiomyopathy (EF 25-35% by cath 07/2016 then 50-55% the following day - although Dr. Salena Saner suspects EF was closer to 40%), CVA 2010, HTN, hypothyroidism, permanent atrial fib, CKD stage III whom we are asked to see for SOB. She was recently admitted 08/19/16 as a code STEMI. She was taken directly to the cath lab and underwent DES x 2 to proximal and mid LAD. She was placed on ASA and Plavix. Her EF was 25% at cath, then echo the next day 08/20/16 showed EF 50-55%, mild mitral stenosis/mitral regurg, mild LAE, moderate TR, PASP 51. She was not on anticoagulation PTA and triple therapy was felt too risky at the time (but could consider Xarelto in a year or sooner if needed). She has previously declined Coumadin due to lab checks. Of note she had a fall in 2014 with nasal bone fracture. She was doing well in OP f/u 08/28/16, had had some diarrhea that resolved on lower dose of atorvastatin.  For the past few days she has noticed some dyspnea but over the weekend 12/2-3 is when things really got bad. She noticed dyspnea with any little exertion as well as waking up several times during the night because of SOB. EMS was called who placed her on 4L [place] due to work of breathing. Her initial BP was 198/159. She felt occasional chest "tingle" but no recurrent chest pain like her MI. No significant nausea, vomiting. She did notice some LLE edema but this has resolved. She has not had any LE pain, palpitations or syncope. Labs notable for BNP 1073, troponin neg x1, Cr 0.96,  glucose 148, pH 7.445, WBC 6, Hgb 11.5, CXR with bibasilar atx and/or consolidation with small bilateral effusions. She received 40mg  IV Lasix x 2 yesterday and then another dose this AM along with NTG paste and prior home regimen as well. Weight 150->142 with - 2.9L OP. She reports dry weight 150, although her appetite has not been good recently so it's not clear if she's lost any body weight in the interim. She reports that she "loves salt" and drinks a lot of water throughout the day but can't really quantify how much.   Past Medical History:  Diagnosis Date  . CAD (coronary artery disease)    a. 07/2016: anterior STEMI s/p DES x2 to LAD  . CKD (chronic kidney disease), stage III   . CVA (cerebral infarction) 2010  . Fall    a. 2014: nasal bone fx.  . Hypertension   . Hypothyroidism   . Ischemic cardiomyopathy    a. EF 25% by cath 08/19/16 then echo showed EF 50-55% 08/20/16.  . Mitral regurgitation    a. mild by echo 07/2016.  . Mitral stenosis    a. mild by echo 07/2016.  Marland Kitchen. Permanent atrial fibrillation (HCC)    a. not presently on anticoagulation due to STEMI 07/2016 and risk of triple therapy with DAPT + anticoag.  . Tricuspid regurgitation    a. mod by echo 07/2016.      Surgical History:  Past Surgical History:  Procedure  Laterality Date  . CARDIAC CATHETERIZATION N/A 08/19/2016   Procedure: Left Heart Cath and Coronary Angiography;  Surgeon: Lyn Records, MD;  LM 30%, pLAD 50%, dLAD 100% s/p  Synergy 2.5 x 24 mm DES, RCA 20%  . CARDIAC CATHETERIZATION N/A 08/19/2016   Procedure: Coronary Stent Intervention;  Surgeon: Lyn Records, MD;  SYNERGY DES 2.5X24 drug eluting stent  . CHOLECYSTECTOMY       Home Meds: Prior to Admission medications   Medication Sig Start Date End Date Taking? Authorizing Provider  acetaminophen (TYLENOL) 500 MG tablet Take 500 mg by mouth at bedtime as needed for mild pain.    Yes Historical Provider, MD  aspirin EC 81 MG tablet Take 81 mg by  mouth daily.   Yes Historical Provider, MD  atorvastatin (LIPITOR) 80 MG tablet Take 0.5 tablets (40 mg total) by mouth daily at 6 PM. 08/23/16  Yes Jake Bathe, MD  carvedilol (COREG) 6.25 MG tablet Take 1 tablet (6.25 mg total) by mouth 2 (two) times daily with a meal. 08/20/16  Yes Janetta Hora, PA-C  clopidogrel (PLAVIX) 75 MG tablet Take 75 mg by mouth daily.   Yes Historical Provider, MD  levothyroxine (SYNTHROID, LEVOTHROID) 88 MCG tablet Take 88 mcg by mouth daily before breakfast.   Yes Historical Provider, MD  loteprednol (ALREX) 0.2 % SUSP Place 1 drop into both eyes 2 (two) times daily.   Yes Historical Provider, MD  Multiple Vitamins-Minerals (OCUVITE PO) Take 1 tablet by mouth daily.   Yes Historical Provider, MD  nitroGLYCERIN (NITROSTAT) 0.4 MG SL tablet Place 0.4 mg under the tongue every 5 (five) minutes as needed for chest pain.  07/29/16  Yes Historical Provider, MD    Inpatient Medications:  . aspirin EC  81 mg Oral Daily  . atorvastatin  40 mg Oral BID  . carvedilol  6.25 mg Oral BID WC  . clopidogrel  75 mg Oral Daily  . enoxaparin (LOVENOX) injection  40 mg Subcutaneous Q24H  . furosemide  40 mg Intravenous BID  . levothyroxine  88 mcg Oral QAC breakfast  . loteprednol  1 drop Both Eyes BID  . nitroGLYCERIN  0.5 inch Topical Q6H  . potassium chloride  40 mEq Oral BID  . sodium chloride flush  3 mL Intravenous Q12H     Allergies: No Known Allergies  Social History   Social History  . Marital status: Widowed    Spouse name: N/A  . Number of children: N/A  . Years of education: N/A   Occupational History  . Not on file.   Social History Main Topics  . Smoking status: Never Smoker  . Smokeless tobacco: Never Used  . Alcohol use No  . Drug use: No  . Sexual activity: Not Currently   Other Topics Concern  . Not on file   Social History Narrative   Lives independently     Family History  Problem Relation Age of Onset  . Diabetes Mellitus II  Son      Review of Systems: No fevers or chills. All other systems reviewed and are otherwise negative except as noted above.  Labs:  Lab Results  Component Value Date   WBC 6.1 09/23/2016   HGB 11.5 (L) 09/23/2016   HCT 37.4 09/23/2016   MCV 96.4 09/23/2016   PLT 167 09/23/2016    Recent Labs Lab 09/24/16 0204  NA 141  K 3.2*  CL 101  CO2 30  BUN 10  CREATININE  1.10*  CALCIUM 8.7*  GLUCOSE 117*   Lab Results  Component Value Date   CHOL 187 08/19/2016   HDL 64 08/19/2016   LDLCALC 113 (H) 08/19/2016   TRIG 52 08/19/2016   No results found for: DDIMER  Radiology/Studies:  Dg Chest Port 1 View  Result Date: 09/23/2016 CLINICAL DATA:  82 year old female with history of dyspnea. EXAM: PORTABLE CHEST 1 VIEW COMPARISON:  Chest x-ray 08/19/2016. FINDINGS: Lung volumes are low. Bibasilar opacities may reflect areas of atelectasis and/or consolidation. Small bilateral pleural effusions. No evidence of pulmonary edema. Heart size is mildly enlarged. Upper mediastinal contours are within normal limits. Atherosclerosis in the thoracic aorta. IMPRESSION: 1. Low lung volumes with bibasilar atelectasis and/or consolidation with small bilateral pleural effusions. 2. Mild cardiomegaly. 3. Atherosclerosis. Electronically Signed   By: Trudie Reed M.D.   On: 09/23/2016 10:04    Wt Readings from Last 3 Encounters:  09/24/16 142 lb 6.4 oz (64.6 kg)  08/28/16 151 lb (68.5 kg)  08/20/16 150 lb 9.2 oz (68.3 kg)    ZOX:WRUEAV fib with one PVC, improvement/evolution of prior ST changes  Physical Exam: Blood pressure (!) 113/46, pulse 82, temperature 97.8 F (36.6 C), temperature source Oral, resp. rate 20, height 5\' 4"  (1.626 m), weight 142 lb 6.4 oz (64.6 kg), SpO2 93 %. Body mass index is 24.44 kg/m. General: Well developed, well nourished elderly WF in no acute distress. Lying 20 degrees flat in bed Head: Normocephalic, atraumatic, sclera non-icteric, no xanthomas, nares are  without discharge.  Neck: No carotid bruits. JVD not elevated. Lungs: Fine crackles at bases, moderate air movement, no wheezes or rhonchi. Breathing is unlabored. Heart: Irregularly irregular with S1 S2. No murmurs, rubs, or gallops appreciated. Abdomen: Soft, non-tender, non-distended with normoactive bowel sounds. No hepatomegaly. No rebound/guarding. No obvious abdominal masses. Msk:  Strength and tone appear normal for age. Extremities: No clubbing or cyanosis. No edema.  Distal pedal pulses are 2+ and equal bilaterally. Neuro: Alert and oriented X 3. No facial asymmetry. No focal deficit. Moves all extremities spontaneously. Psych:  Responds to questions appropriately with a normal affect.     Assessment and Plan  68F with CAD (recent anterior STEMI 07/2016 s/p DES to mLAD & pLAD), ischemic cardiomyopathy (EF 25-35% by cath 07/2016 then 50-55% the following day - although Dr. Salena Saner suspects EF was closer to 40%), CVA 2010, HTN, hypothyroidism, permanent atrial fib, CKD stage III whom we are asked to see for SOB.  1. Acute combined CHF/ICM - improving on IV diuresis. Her weight is down to 142lb. She reports decreased appetite lately so it's not clear if she has lost actual body weight recently as well. Would continue IV Lasix through tonight then reassess in AM before dosing further, to determine if we can change to oral form tomorrow. Acute precipitant not clear, although patient states "I love salt." Will get dietician consult for diet education reinforcement. Reviewed low sodium diet and 2L fluid restriction with patient. Repeat echo pending. (Per review with Dr. Royann Shivers, he feels her echo 10/30 actually showed EF closer to 40%.)  2. HTN with accelerated HTN on admission - improved on current regimen. Follow. Consider changing NTG paste to Imdur.  3. CAD with recent STEMI s/p PCI - no evidence of acute stent thrombosis. No recurrent chest pain. Continue ASA, Plavix, BB, statin.   4. CKD  stage III - follow with diuresis. Suspect improved Cr on admission was actually due to fluid overload, and that Cr will  settle out closer to 1.2-1.3 again.  5. Chronic atrial fibrillation - see above regarding anticoagulation.  Signed, Laurann Montanaayna N Dunn PA-C 09/24/2016, 5:01 PM Pager: [phone]   I have seen and examined the patient along with Dayna N Dunn PA-C.  I have reviewed the chart, notes and new data.  I agree with PA's note.  Key new complaints: breathing "80-90% better", no angina, reports compliance with meds, but eats salty foods Key examination changes: no longer with overt hypervolemia, irregular Key new findings / data: awaiting repeat echo. On my review of echo from last admission, I think EF estimation was generous, in my opinion was 40%.  PLAN: No clear cause of exacerbation, but has underlying systolic and diastolic dysfunction. No reason to suspect a new acute coronary event and I believe her when she reports compliance with meds. May simply have been gradual sodium/water overload, which she did not detect as she was not weighing herself. Need to establish "dry weight" and will DC home on daily loop diuretic in a low dose. Discussed Na restriction, food labels, daily weights, etc in detail.  Thurmon FairMihai Sheppard Luckenbach, MD, [facility] [phone] 09/24/2016, 5:34 PM

## 2016-09-25 ENCOUNTER — Telehealth: Payer: Self-pay | Admitting: Interventional Cardiology

## 2016-09-25 ENCOUNTER — Inpatient Hospital Stay (HOSPITAL_COMMUNITY): Payer: Medicare Other

## 2016-09-25 DIAGNOSIS — I509 Heart failure, unspecified: Secondary | ICD-10-CM

## 2016-09-25 LAB — BASIC METABOLIC PANEL
ANION GAP: 10 (ref 5–15)
BUN: 11 mg/dL (ref 6–20)
CHLORIDE: 99 mmol/L — AB (ref 101–111)
CO2: 31 mmol/L (ref 22–32)
Calcium: 9.1 mg/dL (ref 8.9–10.3)
Creatinine, Ser: 1.26 mg/dL — ABNORMAL HIGH (ref 0.44–1.00)
GFR calc Af Amer: 42 mL/min — ABNORMAL LOW (ref >60)
GFR, EST NON AFRICAN AMERICAN: 36 mL/min — AB (ref >60)
GLUCOSE: 111 mg/dL — AB (ref 65–99)
POTASSIUM: 3.6 mmol/L (ref 3.5–5.1)
Sodium: 140 mmol/L (ref 135–145)

## 2016-09-25 LAB — ECHOCARDIOGRAM LIMITED
Height: 64 in
Weight: 2262.4 oz

## 2016-09-25 MED ORDER — FUROSEMIDE 40 MG PO TABS: 40.0000 mg | ORAL_TABLET | Freq: Every day | ORAL | 0 refills | Status: DC

## 2016-09-25 MED ORDER — POTASSIUM CHLORIDE ER 20 MEQ PO TBCR: 20.0000 meq | EXTENDED_RELEASE_TABLET | Freq: Every day | ORAL | 0 refills | Status: DC

## 2016-09-25 MED ORDER — ENOXAPARIN SODIUM 30 MG/0.3ML ~~LOC~~ SOLN: 30.0000 mg | SUBCUTANEOUS | Status: DC

## 2016-09-25 MED ORDER — FUROSEMIDE 40 MG PO TABS
40.0000 mg | ORAL_TABLET | Freq: Every day | ORAL | Status: DC
  Administered 2016-09-25: 40 mg via ORAL
  Filled 2016-09-25: qty 1

## 2016-09-25 NOTE — Progress Notes (Signed)
Nutrition Education Note  RD consulted for nutrition education regarding new onset CHF.  RD provided "Heart Failure Nutrition Therapy" handout from the Academy of [employer]. Reviewed patient's dietary recall. Provided examples on ways to decrease sodium intake in diet. Discouraged intake of processed foods and use of salt shaker. Encouraged fresh fruits and vegetables as well as whole grain sources of carbohydrates to maximize fiber intake.   RD discussed why it is important for patient to adhere to diet recommendations, and emphasized the role of fluids, foods to avoid, and importance of weighing self daily. Teach back method used. Pt expresses that the hardest part will be avoiding salt. She also reports craving sweets. RD discussed tips for seasoning/flavoring foods and reviewed snack/dessert ideas that are low in sodium.   Expect good compliance.  Body mass index is 24.27 kg/m. Pt meets criteria for Normal Weight based on current BMI.  Current diet order is Heart Healthy, patient is consuming 30-75% of meals at this time. Pt states that she never eats much at breakfast; she feels she is eating normally. She states that she usually weighs 150 lbs, but relates weight loss to fluid. She denies any additional nutrition interventions at this time. Labs and medications reviewed. No further nutrition interventions warranted at this time. RD contact information provided. If additional nutrition issues arise, please re-consult RD.   Dorothea Ogleeanne Shyane Fossum RD, CSP, LDN Inpatient Clinical Dietitian Pager: [phone] After Hours Pager: [phone]

## 2016-09-25 NOTE — Progress Notes (Signed)
Patient Name: Chelsea Fuller Date of Encounter: 09/25/2016  [facility]  Hospital Problem List     Principal Problem:   Acute respiratory failure with hypoxia [facility](HCC) Active Problems:   History of CVA (cerebrovascular accident)   HTN (hypertension)   Hypothyroidism   Cardiomyopathy, ischemic   Chronic atrial fibrillation (HCC)   Acute combined systolic and diastolic heart failure (HCC)   CKD (chronic kidney disease), stage III   Accelerated hypertension   CAD in native artery     Subjective   Feels substantially improved. Weight stable after transition to PO diuretic.  Inpatient Medications    Scheduled Meds: . aspirin EC  81 mg Oral Daily  . atorvastatin  40 mg Oral BID  . carvedilol  6.25 mg Oral BID WC  . clopidogrel  75 mg Oral Daily  . enoxaparin (LOVENOX) injection  30 mg Subcutaneous Q24H  . furosemide  40 mg Oral Daily  . levothyroxine  88 mcg Oral QAC breakfast  . loteprednol  1 drop Both Eyes BID  . nitroGLYCERIN  0.5 inch Topical Q6H  . potassium chloride  40 mEq Oral BID  . sodium chloride flush  3 mL Intravenous Q12H   Continuous Infusions:  PRN Meds: sodium chloride, acetaminophen, ALPRAZolam, ondansetron (ZOFRAN) IV, sodium chloride flush   Vital Signs    Vitals:   09/25/16 0033 09/25/16 0504 09/25/16 0745 09/25/16 1236  BP: 118/62 110/61 (!) 132/56 127/66  Pulse: 89 91 72 69  Resp: 18 18 18 18   Temp: 98.6 F (37 C) 98.2 F (36.8 C) 98.5 F (36.9 C)   TempSrc: Oral Oral Oral   SpO2: 96% 92% 93% 95%  Weight:  141 lb 6.4 oz (64.1 kg)    Height:        Intake/Output Summary (Last 24 hours) at 09/25/16 1458 Last data filed at 09/25/16 1308  Gross per 24 hour  Intake              342 ml  Output              300 ml  Net               42 ml   Filed Weights   09/24/16 0348 09/24/16 1515 09/25/16 0504  Weight: 141 lb 15.6 oz (64.4 kg) 142 lb 6.4 oz (64.6 kg) 141 lb 6.4 oz (64.1 kg)    Physical Exam    Comfortable GEN: Well nourished, well developed, in no acute distress.  HEENT: Grossly normal.  Neck: Supple, no JVD, carotid bruits, or masses. Cardiac: irregular, no murmurs, rubs, or gallops. No clubbing, cyanosis, edema.  Radials/DP/PT 2+ and equal bilaterally.  Respiratory:  Respirations regular and unlabored, clear to auscultation bilaterally. GI: Soft, nontender, nondistended, BS + x 4. MS: no deformity or atrophy. Skin: warm and dry, no rash. Neuro:  Strength and sensation are intact. Psych: AAOx3.  Normal affect.  Labs    CBC  Recent Labs  09/23/16 0926  WBC 6.1  NEUTROABS 4.5  HGB 11.5*  HCT 37.4  MCV 96.4  PLT 167   Basic Metabolic Panel  Recent Labs  09/24/16 0204 09/25/16 0447  NA 141 140  K 3.2* 3.6  CL 101 99*  CO2 30 31  GLUCOSE 117* 111*  BUN 10 11  CREATININE 1.10* 1.26*  CALCIUM 8.7* 9.1     Cardiac Studies   Echo  - Left ventricle: The cavity size was normal. Wall thickness was   normal.  Systolic function was mildly to moderately reduced. The   estimated ejection fraction was in the range of 40% to 45%.   Severe hypokinesis of the mid-apicalanteroseptal, anterior, and   apical myocardium; consistent with infarction in the distribution   of the left anterior descending coronary artery. No evidence of   thrombus. - Aortic valve: There was mild regurgitation. - Mitral valve: Calcified annulus. There was mild regurgitation. - Left atrium: The atrium was mildly dilated. - Pulmonary arteries: Systolic pressure was mildly increased. PA   peak pressure: 35 mm Hg (S).   Patient Profile     [phone] with CAD (recent anterior STEMI 07/2016 s/p DES to mLAD & pLAD), ischemic cardiomyopathy EF 40%, CVA 2010, HTN, hypothyroidism, permanent atrial fib, CKD stage III with improved symptoms after diuresis.  Assessment & Plan    OK for DC home, will arrange f/u in next 1-2 weeks. Keep on furosemide 40 mg daily. Reviewed daily weights and sodium  restriction in detail. Call for weight gain >3 lb in 24h or >5 lb in a week. Reconsider use of ACEi/ARB as outpt based on f/u renal parameters  Signed, Thurmon FairMihai Brenan [place], MD  09/25/2016, 2:58 PM

## 2016-09-25 NOTE — Telephone Encounter (Signed)
TCM Phone call . Appt is on 10/05/16 at 9:30 w/ Boyce MediciBrittany Simmons   Thanks

## 2016-09-25 NOTE — Progress Notes (Signed)
  Echocardiogram 2D Echocardiogram has been performed.  Chelsea SavoyCasey N Anastasija Anfinson 09/25/2016, 10:14 AM

## 2016-09-25 NOTE — Progress Notes (Signed)
Pt discharge education provided to pt and daughter at bedside. IV discontinued, catheter intact, and telemetry removed. Pt has all belongings in hand and paperwork. Pt discharged via wheelchair with daughter and RN.  Chelsea Fuller

## 2016-09-25 NOTE — Discharge Summary (Signed)
Physician Discharge Summary  Jezreel Sisk Leiner WUJ:[id number] DOB: Feb 13, 1924 DOA: 09/23/2016  PCP: Enrique Sack, MD  Admit date: 09/23/2016 Discharge date: 09/25/2016  Admitted From: Home Disposition: Home   Recommendations for Outpatient Follow-up:  1. Please obtain BMP to monitor renal function and CBC to monitor anemia in one week. 2. Consider starting ACE/ARB, previously held due to CKD. 3. Follow up with cardiology as scheduled. Discharged on lasix 40mg  po daily. Discharge weight 141lbs, creatinine 1.26 (historical baseline 1.1-1.2). 4. Reinforce dietary compliance, eats a lot of salt.  Home Health: None Equipment/Devices: None  Discharge Condition: Stable CODE STATUS: DNR Diet recommendation: Heart healthy, received nutrition education on salt restriction.  Brief/Interim Summary: Adlynn Lowenstein Baggettis an 82 y.o.femalewith a history of CAD s/p DESx2 to LAD for STEMI 07/2016, h/o CVA, HTN, hypothyroidism. She has been experiencing dyspnea on exertion and at rest for 5 days. She is not normally on oxygen at home. Due to her work of breathing on arrival, the patient was placed on BiPAP with good results. Additionally, her initial blood pressure was 198/159. This is also decreased with her carvedilol and supporting her breathing. Labs showed BNP of 1073. CXR showed pulmonary congestion and edema. She was admitted to SDU, cardiology was consulted, and she was given IV lasix with improvement. She went down 9 lbs and weaned to 2L oxygen after 3L UOP the next day and was transferred to telemetry. The following day she was weaned to room air, and feels improved. She is discharged (on her 89th birthday) in stable condition with cardiology follow up arranged.   Discharge Diagnoses:  Principal Problem:   Acute respiratory failure with hypoxia (HCC) Active Problems:   History of CVA (cerebrovascular accident)   HTN (hypertension)   Hypothyroidism   Cardiomyopathy, ischemic   Chronic  atrial fibrillation (HCC)   Acute combined systolic and diastolic heart failure (HCC)   CKD (chronic kidney disease), stage III   Accelerated hypertension   CAD in native artery  Acute respiratory failure with hypoxia: Resolved.  Acute systolic heart failure: EF was 25% at cath, though this was followed by echo the next day with EF 50-55% with anterior wall akinesis. Repeat echo 12/5 showed EF 40-45%. BNP 1,073. Wt 150lbs on admission (was 151lbs at follow up w/cardiology) > 141lbs.   CAD: s/p recent DES x2 to LAD for anterior STEMI: Troponin neg, no chest pain, ECG unchanged. - Continue plavix and aspirin x 1 year, after which NOAC may be considered  CKD stage III: Baseline Cr 1.1 - 1.2, below baseline on admission. Returned to 1.26 at discharge.  - ACE/ARB being held due to this, can be reconsidered at discharge. - Monitor BMP at follow up.  Chronic atrial fibrillation: Rate controlled. CHADSVASc 8, has declined labwork required for coumadin, and is felt to be too high risk for bleeding with DAPT + OAC. - Continue aspirin and Plavix - Continue coreg  HTN: Chronic, stable - Continue carvedilol  Hypothyroidism: Chronic, stable, recent TSH 3.835 - Continue synthroid  Hypokalemia: Acute, due to diuresis.  - Repleted  Discharge Instructions Discharge Instructions    (HEART FAILURE PATIENTS) Call MD:  Anytime you have any of the following symptoms: 1) 3 pound weight gain in 24 hours or 5 pounds in 1 week 2) shortness of breath, with or without a dry hacking cough 3) swelling in the hands, feet or stomach 4) if you have to sleep on extra pillows at night in order to breathe.    Complete  by:  As directed    Diet - low sodium heart healthy    Complete by:  As directed    Discharge instructions    Complete by:  As directed    You were admitted with acute heart failure likely due to too much salt intake. The treatment is to improve diet as we've discussed and to take a diuretic  daily. You are cleared to discharge with the following recommendations:  - Take lasix 40mg  every day - Take potassium 20mEq every day (both sent to pharmacy) - Follow up with cardiology as below.  - Have a happy birthday!   Increase activity slowly    Complete by:  As directed        Medication List    TAKE these medications   acetaminophen 500 MG tablet Commonly known as:  TYLENOL Take 500 mg by mouth at bedtime as needed for mild pain.   ALREX 0.2 % Susp Generic drug:  loteprednol Place 1 drop into both eyes 2 (two) times daily.   aspirin EC 81 MG tablet Take 81 mg by mouth daily.   atorvastatin 80 MG tablet Commonly known as:  LIPITOR Take 0.5 tablets (40 mg total) by mouth daily at 6 PM.   carvedilol 6.25 MG tablet Commonly known as:  COREG Take 1 tablet (6.25 mg total) by mouth 2 (two) times daily with a meal.   clopidogrel 75 MG tablet Commonly known as:  PLAVIX Take 75 mg by mouth daily.   furosemide 40 MG tablet Commonly known as:  LASIX Take 1 tablet (40 mg total) by mouth daily. Start taking on:  09/26/2016   levothyroxine 88 MCG tablet Commonly known as:  SYNTHROID, LEVOTHROID Take 88 mcg by mouth daily before breakfast.   nitroGLYCERIN 0.4 MG SL tablet Commonly known as:  NITROSTAT Place 0.4 mg under the tongue every 5 (five) minutes as needed for chest pain.   OCUVITE PO Take 1 tablet by mouth daily.   Potassium Chloride ER 20 MEQ Tbcr Take 20 mEq by mouth daily.      Follow-up Information    Robbie LisBrittainy Simmons, PA-C Follow up on 10/05/2016.   Specialties:  Cardiology, Radiology Why:  at 9:30 am for follow up Contact information: [address] [place] [address] [phone]          No Known Allergies  Consultations:  Cardiology, Dr. Royann Shiversroitoru  Procedures/Studies: Dg Chest Port 1 View  Result Date: 09/23/2016 CLINICAL DATA:  82 year old female with history of dyspnea. EXAM: PORTABLE CHEST 1 VIEW COMPARISON:  Chest  x-ray 08/19/2016. FINDINGS: Lung volumes are low. Bibasilar opacities may reflect areas of atelectasis and/or consolidation. Small bilateral pleural effusions. No evidence of pulmonary edema. Heart size is mildly enlarged. Upper mediastinal contours are within normal limits. Atherosclerosis in the thoracic aorta. IMPRESSION: 1. Low lung volumes with bibasilar atelectasis and/or consolidation with small bilateral pleural effusions. 2. Mild cardiomegaly. 3. Atherosclerosis. Electronically Signed   By: Trudie Reedaniel  Entrikin M.D.   On: 09/23/2016 10:04    Echocardiogram 09/25/2016:  - Left ventricle: The cavity size was normal. Wall thickness was   normal. Systolic function was mildly to moderately reduced. The   estimated ejection fraction was in the range of 40% to 45%.   Severe hypokinesis of the mid-apicalanteroseptal, anterior, and   apical myocardium; consistent with infarction in the distribution   of the left anterior descending coronary artery. No evidence of   thrombus. - Aortic valve: There was mild regurgitation. -  Mitral valve: Calcified annulus. There was mild regurgitation. - Left atrium: The atrium was mildly dilated. - Pulmonary arteries: Systolic pressure was mildly increased. PA   peak pressure: 35 mm Hg (S).  Subjective: Pt feels better, ambulating on room air without difficulty, received diet education. Wants to go home to celebrate her birthday.  Discharge Exam: Vitals:   09/25/16 0745 09/25/16 1236  BP: (!) 132/56 127/66  Pulse: 72 69  Resp: 18 18  Temp: 98.5 F (36.9 C)    Vitals:   09/25/16 0033 09/25/16 0504 09/25/16 0745 09/25/16 1236  BP: 118/62 110/61 (!) 132/56 127/66  Pulse: 89 91 72 69  Resp: 18 18 18 18   Temp: 98.6 F (37 C) 98.2 F (36.8 C) 98.5 F (36.9 C)   TempSrc: Oral Oral Oral   SpO2: 96% 92% 93% 95%  Weight:  64.1 kg (141 lb 6.4 oz)    Height:       General: Pt is alert, awake, not in acute distress Cardiovascular: Irreg, S1/S2 +, no rubs, no  gallops Respiratory: CTA bilaterally, no wheezing, no rhonchi Abdominal: Soft, NT, ND, bowel sounds + Extremities: no edema, no cyanosis  The results of significant diagnostics from this hospitalization (including imaging, microbiology, ancillary and laboratory) are listed below for reference.    Labs: BNP (last 3 results)  Recent Labs  09/23/16 0926  BNP 1,073.4*   Basic Metabolic Panel:  Recent Labs Lab 09/23/16 0926 09/24/16 0204 09/25/16 0447  NA 143 141 140  K 3.7 3.2* 3.6  CL 109 101 99*  CO2 24 30 31   GLUCOSE 148* 117* 111*  BUN 10 10 11   CREATININE 0.96 1.10* 1.26*  CALCIUM 9.1 8.7* 9.1   CBC:  Recent Labs Lab 09/23/16 0926  WBC 6.1  NEUTROABS 4.5  HGB 11.5*  HCT 37.4  MCV 96.4  PLT 167   Urinalysis    Component Value Date/Time   COLORURINE YELLOW 11/18/2008 1705   APPEARANCEUR HAZY (A) 11/18/2008 1705   LABSPEC 1.023 11/18/2008 1705   PHURINE 6.5 11/18/2008 1705   GLUCOSEU NEGATIVE 11/18/2008 1705   HGBUR MODERATE (A) 11/18/2008 1705   BILIRUBINUR NEGATIVE 11/18/2008 1705   KETONESUR NEGATIVE 11/18/2008 1705   PROTEINUR 30 (A) 11/18/2008 1705   UROBILINOGEN 1.0 11/18/2008 1705   NITRITE NEGATIVE 11/18/2008 1705   LEUKOCYTESUR SMALL (A) 11/18/2008 1705   Sepsis Labs Invalid input(s): PROCALCITONIN,  WBC,  LACTICIDVEN Microbiology Recent Results (from the past 240 hour(s))  MRSA PCR Screening     Status: None   Collection Time: 09/23/16  6:53 PM  Result Value Ref Range Status   MRSA by PCR NEGATIVE NEGATIVE Final    Comment:        The GeneXpert MRSA Assay (FDA approved for NASAL specimens only), is one component of a comprehensive MRSA colonization surveillance program. It is not intended to diagnose MRSA infection nor to guide or monitor treatment for MRSA infections.     Time coordinating discharge: Over 30 minutes  Hazeline Junkeryan Barak Bialecki, MD  [facility] 09/25/2016, 3:18 PM Pager [phone]  If 7PM-7AM, please contact  night-coverage [email or web address] Password [email or web address]

## 2016-09-26 NOTE — Telephone Encounter (Signed)
[facility] 09/[facility].  Patient understands to follow up with provider Robbie LisBrittainy Simmons, PA-C on 10/05/16 at 9:30am at [address].  Patient understands discharge instructions? Yes  Patient understands medications and regiment? Yes  Patient understands to bring all medications to this visit? Yes

## 2016-09-26 NOTE — Telephone Encounter (Signed)
Left message to call back  

## 2016-10-05 ENCOUNTER — Ambulatory Visit (INDEPENDENT_AMBULATORY_CARE_PROVIDER_SITE_OTHER): Payer: Medicare Other | Admitting: Cardiology

## 2016-10-05 ENCOUNTER — Telehealth: Payer: Self-pay | Admitting: Interventional Cardiology

## 2016-10-05 ENCOUNTER — Encounter: Payer: Self-pay | Admitting: Cardiology

## 2016-10-05 VITALS — BP 122/52 | HR 70 | Ht 64.0 in | Wt 142.5 lb

## 2016-10-05 DIAGNOSIS — Z5189 Encounter for other specified aftercare: Secondary | ICD-10-CM | POA: Diagnosis not present

## 2016-10-05 DIAGNOSIS — R7989 Other specified abnormal findings of blood chemistry: Secondary | ICD-10-CM

## 2016-10-05 DIAGNOSIS — Z5181 Encounter for therapeutic drug level monitoring: Secondary | ICD-10-CM

## 2016-10-05 LAB — BASIC METABOLIC PANEL
BUN: 21 mg/dL (ref 7–25)
CHLORIDE: 103 mmol/L (ref 98–110)
CO2: 25 mmol/L (ref 20–31)
Calcium: 9.4 mg/dL (ref 8.6–10.4)
Creat: 1.47 mg/dL — ABNORMAL HIGH (ref 0.60–0.88)
GLUCOSE: 131 mg/dL — AB (ref 65–99)
POTASSIUM: 4.6 mmol/L (ref 3.5–5.3)
SODIUM: 142 mmol/L (ref 135–146)

## 2016-10-05 NOTE — Patient Instructions (Signed)
Medication Instructions:   Your physician recommends that you continue on your current medications as directed. Please refer to the Current Medication list given to you today.   Labwork: Your physician recommends that you have lab work today: BMET   Testing/Procedures: -None  Follow-Up: Your physician recommends that you keep your scheduled  follow-up appointment with Dr. Katrinka BlazingSmith in February.    Any Other Special Instructions Will Be Listed Below (If Applicable).     If you need a refill on your cardiac medications before your next appointment, please call your pharmacy.   Heart Failure Heart failure means your heart has trouble pumping blood. This makes it hard for your body to work well. Heart failure is usually a long-term (chronic) condition. You must take good care of yourself and follow your doctor's treatment plan. HOME CARE  Take your heart medicine as told by your doctor.  Do not stop taking medicine unless your doctor tells you to.  Do not skip any dose of medicine.  Refill your medicines before they run out.  Take other medicines only as told by your doctor or pharmacist.  Stay active if told by your doctor. The elderly and people with severe heart failure should talk with a doctor about physical activity.  Eat heart-healthy foods. Choose foods that are without trans fat and are low in saturated fat, cholesterol, and salt (sodium). This includes fresh or frozen fruits and vegetables, fish, lean meats, fat-free or low-fat dairy foods, whole grains, and high-fiber foods. Lentils and dried peas and beans (legumes) are also good choices.  Limit salt if told by your doctor.  Cook in a healthy way. Roast, grill, broil, bake, poach, steam, or stir-fry foods.  Limit fluids as told by your doctor.  Weigh yourself every morning. Do this after you pee (urinate) and before you eat breakfast. Write down your weight to give to your doctor.  Take your blood pressure and  write it down if your doctor tells you to.  Ask your doctor how to check your pulse. Check your pulse as told.  Lose weight if told by your doctor.  Stop smoking or chewing tobacco. Do not use gum or patches that help you quit without your doctor's approval.  Schedule and go to doctor visits as told.  Nonpregnant women should have no more than 1 drink a day. Men should have no more than 2 drinks a day. Talk to your doctor about drinking alcohol.  Stop illegal drug use.  Stay current with shots (immunizations).  Manage your health conditions as told by your doctor.  Learn to manage your stress.  Rest when you are tired.  If it is really hot outside:  Avoid intense activities.  Use air conditioning or fans, or get in a cooler place.  Avoid caffeine and alcohol.  Wear loose-fitting, lightweight, and light-colored clothing.  If it is really cold outside:  Avoid intense activities.  Layer your clothing.  Wear mittens or gloves, a hat, and a scarf when going outside.  Avoid alcohol.  Learn about heart failure and get support as needed.  Get help to maintain or improve your quality of life and your ability to care for yourself as needed. GET HELP IF:   You gain weight quickly.  You are more short of breath than usual.  You cannot do your normal activities.  You tire easily.  You cough more than normal, especially with activity.  You have any or more puffiness (swelling) in areas such as  your hands, feet, ankles, or belly (abdomen).  You cannot sleep because it is hard to breathe.  You feel like your heart is beating fast (palpitations).  You get dizzy or light-headed when you stand up. GET HELP RIGHT AWAY IF:   You have trouble breathing.  There is a change in mental status, such as becoming less alert or not being able to focus.  You have chest pain or discomfort.  You faint. MAKE SURE YOU:   Understand these instructions.  Will watch your  condition.  Will get help right away if you are not doing well or get worse. This information is not intended to replace advice given to you by your health care provider. Make sure you discuss any questions you have with your health care provider. Document Released: 07/17/2008 Document Revised: 10/29/2014 Document Reviewed: 11/24/2012 Elsevier Interactive Patient Education  2017 [employer]vier Inc.  Low-Sodium Eating Plan Sodium raises blood pressure and causes water to be held in the body. Getting less sodium from food will help lower your blood pressure, reduce any swelling, and protect your heart, liver, and kidneys. We get sodium by adding salt (sodium chloride) to food. Most of our sodium comes from canned, boxed, and frozen foods. Restaurant foods, fast foods, and pizza are also very high in sodium. Even if you take medicine to lower your blood pressure or to reduce fluid in your body, getting less sodium from your food is important. What is my plan? Most people should limit their sodium intake to 2,300 mg a day. Your health care provider recommends that you limit your sodium intake to __________ a day. What do I need to know about this eating plan? For the low-sodium eating plan, you will follow these general guidelines:  Choose foods with a % Daily Value for sodium of less than 5% (as listed on the food label).  Use salt-free seasonings or herbs instead of table salt or sea salt.  Check with your health care provider or pharmacist before using salt substitutes.  Eat fresh foods.  Eat more vegetables and fruits.  Limit canned vegetables. If you do use them, rinse them well to decrease the sodium.  Limit cheese to 1 oz (28 g) per day.  Eat lower-sodium products, often labeled as "lower sodium" or "no salt added."  Avoid foods that contain monosodium glutamate (MSG). MSG is sometimes added to [place] food and some canned foods.  Check food labels (Nutrition Facts labels) on foods to  learn how much sodium is in one serving.  Eat more home-cooked food and less restaurant, buffet, and fast food.  When eating at a restaurant, ask that your food be prepared with less salt, or no salt if possible. How do I read food labels for sodium information? The Nutrition Facts label lists the amount of sodium in one serving of the food. If you eat more than one serving, you must multiply the listed amount of sodium by the number of servings. Food labels may also identify foods as:  Sodium free-Less than 5 mg in a serving.  Very low sodium-35 mg or less in a serving.  Low sodium-140 mg or less in a serving.  Light in sodium-50% less sodium in a serving. For example, if a food that usually has 300 mg of sodium is changed to become light in sodium, it will have 150 mg of sodium.  Reduced sodium-25% less sodium in a serving. For example, if a food that usually has 400 mg of sodium is  changed to reduced sodium, it will have 300 mg of sodium. What foods can I eat? Grains  Low-sodium cereals, including oats, puffed wheat and rice, and shredded wheat cereals. Low-sodium crackers. Unsalted rice and pasta. Lower-sodium bread. Vegetables  Frozen or fresh vegetables. Low-sodium or reduced-sodium canned vegetables. Low-sodium or reduced-sodium tomato sauce and paste. Low-sodium or reduced-sodium tomato and vegetable juices. Fruits  Fresh, frozen, and canned fruit. Fruit juice. Meat and Other Protein Products  Low-sodium canned tuna and salmon. Fresh or frozen meat, poultry, seafood, and fish. Lamb. Unsalted nuts. Dried beans, peas, and lentils without added salt. Unsalted canned beans. Homemade soups without salt. Eggs. Dairy  Milk. Soy milk. Ricotta cheese. Low-sodium or reduced-sodium cheeses. Yogurt. Condiments  Fresh and dried herbs and spices. Salt-free seasonings. Onion and garlic powders. Low-sodium varieties of mustard and ketchup. Fresh or refrigerated horseradish. Lemon juice. Fats  and Oils  Reduced-sodium salad dressings. Unsalted butter. Other  Unsalted popcorn and pretzels. The items listed above may not be a complete list of recommended foods or beverages. Contact your dietitian for more options.  What foods are not recommended? Grains  Instant hot cereals. Bread stuffing, pancake, and biscuit mixes. Croutons. Seasoned rice or pasta mixes. Noodle soup cups. Boxed or frozen macaroni and cheese. Self-rising flour. Regular salted crackers. Vegetables  Regular canned vegetables. Regular canned tomato sauce and paste. Regular tomato and vegetable juices. Frozen vegetables in sauces. Salted [place] fries. Olives. Rosita FirePickles. Relishes. Sauerkraut. Salsa. Meat and Other Protein Products  Salted, canned, smoked, spiced, or pickled meats, seafood, or fish. Bacon, ham, sausage, hot dogs, corned beef, chipped beef, and packaged luncheon meats. Salt pork. Jerky. Pickled herring. Anchovies, regular canned tuna, and sardines. Salted nuts. Dairy  Processed cheese and cheese spreads. Cheese curds. Blue cheese and cottage cheese. Buttermilk. Condiments  Onion and garlic salt, seasoned salt, table salt, and sea salt. Canned and packaged gravies. Worcestershire sauce. Tartar sauce. Barbecue sauce. Teriyaki sauce. Soy sauce, including reduced sodium. Steak sauce. Fish sauce. Oyster sauce. Cocktail sauce. Horseradish that you find on the shelf. Regular ketchup and mustard. Meat flavorings and tenderizers. Bouillon cubes. Hot sauce. Tabasco sauce. Marinades. Taco seasonings. Relishes. Fats and Oils  Regular salad dressings. Salted butter. Margarine. Ghee. Bacon fat. Other  Potato and tortilla chips. Corn chips and puffs. Salted popcorn and pretzels. Canned or dried soups. Pizza. Frozen entrees and pot pies. The items listed above may not be a complete list of foods and beverages to avoid. Contact your dietitian for more information.  This information is not intended to replace advice given to  you by your health care provider. Make sure you discuss any questions you have with your health care provider. Document Released: 03/30/2002 Document Revised: 03/15/2016 Document Reviewed: 08/12/2013 Elsevier Interactive Patient Education  2017 [employer]

## 2016-10-05 NOTE — Progress Notes (Signed)
10/05/2016 Chelsea Fuller   02/11/1924  [id number]  Primary Physician GREEN, Lorenda IshiharaEDWIN JAY, MD Primary Cardiologist: Dr. Katrinka BlazingSmith    Reason for Visit/CC: [facility] F/u for Acute CHF/ CAD   HPI:  The patient is a 82 y/o female, followed by Dr. Katrinka BlazingSmith, who presents to clinic today for post hospital f/u after a recent admission for acute CHF.   She has a history of hypertension, hypothyroidism, chronic atrial fibrillation (felt too high risk [facility]), CKD and stroke (2010) and recently diagnosed CAD w/ STEMI s/p DESx2 to LAD. [facility]29as a CODE STEMI. She was taken directly to the cath lab and underwent DES x 2 to proximal and mid LAD. Her EF was 25% at cath, considered ICM. She was started on Coreg, but no ACE/ARB secondary to renal insufficiency. Interestingly, 2D ECHO the following day showed an EF of 50-55% with akinesis of the anterior wall, mild MS, mild LAE, mod TR,  PA pressure 51. She was placed on ASA and Plavix (tripple therapy avoided given age and fragility). Plan to to consider Eliquis vs Xarelto with either ASA or Plavix in 6 months.  She was doing well following her STEMI until 09/23/16 when she developed fluid retention and dyspnea. She was admitted for acute pulmonary edema, felt to be related to dietary indiscretion with sodium. BNP was abnormal at 1073. She was treated with IV lasix and diuresed. She was transitioned to PO lasix + supplemental K.   She presents back to clinic today for f/u. She is here with her daughter. She recently had her 89th birthday. She has done well since discharge. She has changed her diet and is avoiding sodium. She now uses Ms. Dash and is reading food labels for sodium content. She weighs herself daily. No significant weight fluctuations. No LEE, orthopnea or PND. She denies CP. She has been fully compliant with medications.    Current Meds  Medication Sig  . acetaminophen (TYLENOL) 500 MG tablet Take 500 mg by  mouth at bedtime as needed for mild pain.   Marland Kitchen. aspirin EC 81 MG tablet Take 81 mg by mouth daily.  Marland Kitchen. atorvastatin (LIPITOR) 80 MG tablet Take 0.5 tablets (40 mg total) by mouth daily at 6 PM.  . benzonatate (TESSALON) 200 MG capsule   . carvedilol (COREG) 6.25 MG tablet Take 1 tablet (6.25 mg total) by mouth 2 (two) times daily with a meal.  . clopidogrel (PLAVIX) 75 MG tablet Take 75 mg by mouth daily.  . furosemide (LASIX) 40 MG tablet Take 1 tablet (40 mg total) by mouth daily.  Marland Kitchen. levothyroxine (SYNTHROID, LEVOTHROID) 88 MCG tablet Take 88 mcg by mouth daily before breakfast.  . loteprednol (ALREX) 0.2 % SUSP Place 1 drop into both eyes 2 (two) times daily.  . multivitamin-lutein (OCUVITE-LUTEIN) CAPS capsule Take 1 capsule by mouth daily.  . nitroGLYCERIN (NITROSTAT) 0.4 MG SL tablet Place 0.4 mg under the tongue every 5 (five) minutes as needed for chest pain.   . potassium chloride 20 MEQ TBCR Take 20 mEq by mouth daily.   No Known Allergies Past Medical History:  Diagnosis Date  . CAD (coronary artery disease)    a. 07/2016: anterior STEMI s/p DES x2 to LAD  . CKD (chronic kidney disease), stage III   . CVA (cerebral infarction) 2010  . Fall    a. 2014: nasal bone fx.  . Hypertension   . Hypothyroidism   . Ischemic cardiomyopathy  a. EF 25% by cath 08/19/16 then echo showed EF 50-55% 08/20/16.  . Mitral regurgitation    a. mild by echo 07/2016.  . Mitral stenosis    a. mild by echo 07/2016.  Marland Kitchen Permanent atrial fibrillation (HCC)    a. not presently on anticoagulation due to STEMI 07/2016 and risk of triple therapy with DAPT + anticoag.  . Tricuspid regurgitation    a. mod by echo 07/2016.   Family History  Problem Relation Age of Onset  . Diabetes Mellitus II Son    Past Surgical History:  Procedure Laterality Date  . CARDIAC CATHETERIZATION N/A 08/19/2016   Procedure: Left Heart Cath and Coronary Angiography;  Surgeon: Lyn Records, MD;  LM 30%, pLAD 50%, dLAD 100%  s/p  Synergy 2.5 x 24 mm DES, RCA 20%  . CARDIAC CATHETERIZATION N/A 08/19/2016   Procedure: Coronary Stent Intervention;  Surgeon: Lyn Records, MD;  SYNERGY DES 2.5X24 drug eluting stent  . CHOLECYSTECTOMY     Social History   Social History  . Marital status: Widowed    Spouse name: N/A  . Number of children: N/A  . Years of education: N/A   Occupational History  . Not on file.   Social History Main Topics  . Smoking status: Never Smoker  . Smokeless tobacco: Never Used  . Alcohol use No  . Drug use: No  . Sexual activity: Not Currently   Other Topics Concern  . Not on file   Social History Narrative   Lives independently     Review of Systems: General: negative for chills, fever, night sweats or weight changes.  Cardiovascular: negative for chest pain, dyspnea on exertion, edema, orthopnea, palpitations, paroxysmal nocturnal dyspnea or shortness of breath Dermatological: negative for rash Respiratory: negative for cough or wheezing Urologic: negative for hematuria Abdominal: negative for nausea, vomiting, diarrhea, bright red blood per rectum, melena, or hematemesis Neurologic: negative for visual changes, syncope, or dizziness All other systems reviewed and are otherwise negative except as noted above.   Physical Exam:  Blood pressure (!) 122/52, height 5\' 4"  (1.626 m), weight 142 lb 8 oz (64.6 kg).  General appearance: alert, cooperative and no distress Neck: no carotid bruit and no JVD Lungs: clear to auscultation bilaterally Heart: regular rate and rhythm, S1, S2 normal, no murmur, click, rub or gallop Extremities: extremities normal, atraumatic, no cyanosis or edema Pulses: 2+ and symmetric Skin: Skin color, texture, turgor normal. No rashes or lesions Neurologic: Grossly normal  EKG not performed  ASSESSMENT AND PLAN:    1. CAD: s/p recent anterior STEMI treated with PCI + DES x2 to proximal and distal LAD 07/2016. She is stable without angina.  Continue DAPT with ASA + Plavix, BB and statin. No ACE/ARB given CKD.   2. Combined Systolic + Diastolic Dysfunction: EF was 25% at cath.  Interestingly, 2D ECHO the following day showed an EF of 50-55% with akinesis of the anterior wall. Recent admission for acute pulmonary edema in the setting of dietary indiscretion with sodium. Volume is now stable with daily Lasix. We will check a repeat BMP today to ensure renal function and K are stable. Continue daily weights and low sodium diet. Continue BB for LV dysfunction. No ACE/ARB given CKD.   3. Chronic Atrial Fibrillation: rate is controlled on Coreg. Not on anticoagulation currently given age, fragility and current treatment with DAPT for DES. Risk for bleed with triple therapy is too high. Plan is to continue ASA + Plavix x  6 months, per Dr. Katrinka BlazingSmith. At that time, will consider addition of Eliquis or Xarelto with either ASA or Plavix.   4. CKD: check BMP today given addition of Lasix.   5. HTN: Controlled on current regimen.    PLAN  Keep f/u with Dr. Katrinka BlazingSmith in Feb. 2018  Robbie LisBrittainy Doneshia Hill PA-C 10/05/2016 9:39 AM

## 2016-10-05 NOTE — Telephone Encounter (Signed)
New Message  Pt voiced wanting to speak with PA about her appt she had this morning.  Please f/u with pt

## 2016-10-08 NOTE — Telephone Encounter (Signed)
Pt aware of her lab results. She will come by and repeat BMET 10/23/16. Order in EPIC.

## 2016-10-23 ENCOUNTER — Other Ambulatory Visit: Payer: Medicare Other | Admitting: *Deleted

## 2016-10-23 DIAGNOSIS — R7989 Other specified abnormal findings of blood chemistry: Secondary | ICD-10-CM

## 2016-10-23 DIAGNOSIS — I158 Other secondary hypertension: Secondary | ICD-10-CM

## 2016-10-23 DIAGNOSIS — I482 Chronic atrial fibrillation, unspecified: Secondary | ICD-10-CM

## 2016-10-24 LAB — BASIC METABOLIC PANEL
BUN/Creatinine Ratio: 14 (ref 12–28)
BUN: 17 mg/dL (ref 10–36)
CO2: 27 mmol/L (ref 18–29)
Calcium: 9.3 mg/dL (ref 8.7–10.3)
Chloride: 101 mmol/L (ref 96–106)
Creatinine, Ser: 1.22 mg/dL — ABNORMAL HIGH (ref 0.57–1.00)
GFR calc Af Amer: 45 mL/min/{1.73_m2} — ABNORMAL LOW (ref >59)
GFR, EST NON AFRICAN AMERICAN: 39 mL/min/{1.73_m2} — AB (ref >59)
GLUCOSE: 112 mg/dL — AB (ref 65–99)
POTASSIUM: 4.7 mmol/L (ref 3.5–5.2)
SODIUM: 146 mmol/L — AB (ref 134–144)

## 2016-11-23 ENCOUNTER — Telehealth: Payer: Self-pay | Admitting: Interventional Cardiology

## 2016-11-23 NOTE — Telephone Encounter (Signed)
New Message      Fax [phone]   Needs Doctors signature for referring program for Cardiac REhab

## 2016-11-23 NOTE — Telephone Encounter (Signed)
Left message to call back  

## 2016-11-27 NOTE — Telephone Encounter (Signed)
Left message to call back  

## 2016-11-28 NOTE — Telephone Encounter (Signed)
Left message to call back  

## 2016-11-29 NOTE — Progress Notes (Signed)
Cardiology Office Note    Date:  11/30/2016   ID:  Chelsea Fuller, DOB Feb 28, 1924, MRN [id number]  PCP:  Enrique Sack, MD  Cardiologist: Lesleigh Noe, MD   Chief Complaint  Patient presents with  . Coronary Artery Disease  . Shortness of Breath    History of Present Illness:  Chelsea Fuller is a 83 y.o. female with a history of hypertension, hypothyroidism, chronic atrial fibrillation (felt too high risk for [facility]), CKD and stroke (2010) and recently diagnosed CAD w/ STEMI s/p DESx2 to LAD. Initial EF 25% improved to greater than 50% by follow-up echo.Post infarct readmission to the hospital with acute on chronic diastolic heart failure requiring the addition of diuretic therapy.  Pain in the left lower extremity with walking. No chest discomfort. Dyspnea has not recurred. She denies syncope. Since starting diuretic therapy, orthopnea and PND has resolved.  Past Medical History:  Diagnosis Date  . CAD (coronary artery disease)    a. 07/2016: anterior STEMI s/p DES x2 to LAD  . CKD (chronic kidney disease), stage III   . CVA (cerebral infarction) 2010  . Fall    a. 2014: nasal bone fx.  . Hypertension   . Hypothyroidism   . Ischemic cardiomyopathy    a. EF 25% by cath 08/19/16 then echo showed EF 50-55% 08/20/16.  . Mitral regurgitation    a. mild by echo 07/2016.  . Mitral stenosis    a. mild by echo 07/2016.  Marland Kitchen Permanent atrial fibrillation (HCC)    a. not presently on anticoagulation due to STEMI 07/2016 and risk of triple therapy with DAPT + anticoag.  . Tricuspid regurgitation    a. mod by echo 07/2016.    Past Surgical History:  Procedure Laterality Date  . CARDIAC CATHETERIZATION N/A 08/19/2016   Procedure: Left Heart Cath and Coronary Angiography;  Surgeon: Lyn Records, MD;  LM 30%, pLAD 50%, dLAD 100% s/p  Synergy 2.5 x 24 mm DES, RCA 20%  . CARDIAC CATHETERIZATION N/A 08/19/2016   Procedure: Coronary Stent Intervention;  Surgeon: Lyn Records, MD;  SYNERGY DES 2.5X24 drug eluting stent  . CHOLECYSTECTOMY      Current Medications: Outpatient Medications Prior to Visit  Medication Sig Dispense Refill  . acetaminophen (TYLENOL) 500 MG tablet Take 500 mg by mouth at bedtime as needed for mild pain.     Marland Kitchen aspirin EC 81 MG tablet Take 81 mg by mouth daily.    . carvedilol (COREG) 6.25 MG tablet Take 1 tablet (6.25 mg total) by mouth 2 (two) times daily with a meal. 180 tablet 3  . clopidogrel (PLAVIX) 75 MG tablet Take 75 mg by mouth daily.    . furosemide (LASIX) 40 MG tablet Take 1 tablet (40 mg total) by mouth daily. 30 tablet 0  . levothyroxine (SYNTHROID, LEVOTHROID) 88 MCG tablet Take 88 mcg by mouth daily before breakfast.    . loteprednol (ALREX) 0.2 % SUSP Place 1 drop into both eyes 2 (two) times daily.    . multivitamin-lutein (OCUVITE-LUTEIN) CAPS capsule Take 1 capsule by mouth daily.    . nitroGLYCERIN (NITROSTAT) 0.4 MG SL tablet Place 0.4 mg under the tongue every 5 (five) minutes as needed for chest pain.     . potassium chloride 20 MEQ TBCR Take 20 mEq by mouth daily. 30 tablet 0  . atorvastatin (LIPITOR) 80 MG tablet Take 0.5 tablets (40 mg total) by mouth daily at 6 PM. 45 tablet  3  . benzonatate (TESSALON) 200 MG capsule      No facility-administered medications prior to visit.      Allergies:   Patient has no known allergies.   Social History   Social History  . Marital status: Widowed    Spouse name: N/A  . Number of children: N/A  . Years of education: N/A   Social History Main Topics  . Smoking status: Never Smoker  . Smokeless tobacco: Never Used  . Alcohol use No  . Drug use: No  . Sexual activity: Not Currently   Other Topics Concern  . None   Social History Narrative   Lives independently     Family History:  The patient's family history includes Diabetes Mellitus II in her son.   ROS:   Please see the history of present illness.    Difficulty with balance. Fatigue with  physical activity. Sleeping well.  All other systems reviewed and are negative.   PHYSICAL EXAM:   VS:  BP 140/62 (BP Location: Right Arm)   Pulse 79   Ht 5\' 4"  (1.626 m)   Wt 142 lb 12.8 oz (64.8 kg)   BMI 24.51 kg/m    GEN: Well nourished, well developed, in no acute distress  HEENT: normal  Neck: no JVD, carotid bruits, or masses Cardiac: RRR; no murmurs, rubs, or gallops. Diminished pulses in both feet. Respiratory:  clear to auscultation bilaterally, normal work of breathing GI: soft, nontender, nondistended, + BS MS: no deformity or atrophy  Skin: warm and dry, no rash Neuro:  Alert and Oriented x 3, Strength and sensation are intact Psych: euthymic mood, full affect  Wt Readings from Last 3 Encounters:  11/30/16 142 lb 12.8 oz (64.8 kg)  10/05/16 142 lb 8 oz (64.6 kg)  09/25/16 141 lb 6.4 oz (64.1 kg)      Studies/Labs Reviewed:   EKG:  EKG  Not repeated  Recent Labs: 08/19/2016: ALT 16; Magnesium 1.7; TSH 3.835 09/23/2016: B Natriuretic Peptide 1,073.4; Hemoglobin 11.5; Platelets 167 10/23/2016: BUN 17; Creatinine, Ser 1.22; Potassium 4.7; Sodium 146   Lipid Panel    Component Value Date/Time   CHOL 187 08/19/2016 0328   TRIG 52 08/19/2016 0328   HDL 64 08/19/2016 0328   CHOLHDL 2.9 08/19/2016 0328   VLDL 10 08/19/2016 0328   LDLCALC 113 (H) 08/19/2016 0328    Additional studies/ records that were reviewed today include:  No new Echocardiogram December 2017: Study Conclusions  - Left ventricle: The cavity size was normal. Wall thickness was   normal. Systolic function was mildly to moderately reduced. The   estimated ejection fraction was in the range of 40% to 45%.   Severe hypokinesis of the mid-apicalanteroseptal, anterior, and   apical myocardium; consistent with infarction in the distribution   of the left anterior descending coronary artery. No evidence of   thrombus. - Aortic valve: There was mild regurgitation. - Mitral valve: Calcified  annulus. There was mild regurgitation. - Left atrium: The atrium was mildly dilated. - Pulmonary arteries: Systolic pressure was mildly increased. PA   peak pressure: 35 mm Hg (S).   ASSESSMENT:    1. CAD in native artery   2. Acute combined systolic and diastolic heart failure (HCC)   3. Chronic atrial fibrillation (HCC)   4. Bilateral leg pain   5. Essential hypertension      PLAN:  In order of problems listed above:  1. LAD was stented during acute infarction with good  result. Relatively short hospital stay but several weeks later represented with acute heart failure felt secondary to volume overload in the setting of acute diastolic dysfunction from myocardial infarction. Now stable on diuretic therapy. No anginal complaints. No workup at this time. 2. EF noted to be 40-45% when had acute decompensation in December. Stable now on furosemide. Labs are stable on diuretics. 3. CHADS VASC 4. She has previously refused Coumadin. Start Eliquis 2.5 mg twice a day. Stop aspirin. We'll plan on continuing Plavix for a total of 6 months post infarct and then discontinue Plavix. Clinical follow-up in 2 months. 4. Lower extremity arterial Doppler study to rule out PAD 5. Continue same therapy otherwise.    Medication Adjustments/Labs and Tests Ordered: Current medicines are reviewed at length with the patient today.  Concerns regarding medicines are outlined above.  Medication changes, Labs and Tests ordered today are listed in the Patient Instructions below. There are no Patient Instructions on file for this visit.   Signed, Lesleigh Noe, MD  11/30/2016 11:13 AM    [facility] [address] [place], [place], [place]  [address] Phone: [phone]; Fax: [phone]

## 2016-11-29 NOTE — Telephone Encounter (Signed)
Spoke with Sherri and asked about sending over referral. She states that referral has been sent multiple times since October.  Looked in media and located two forms that had been signed and faxed to their office.  She states they never received them.  Clarified fax number [phone].  Sent both forms to their office.

## 2016-11-29 NOTE — Telephone Encounter (Signed)
Follow up    Chelsea Fuller verbalized that she is calling to speak to rn

## 2016-11-30 ENCOUNTER — Ambulatory Visit (INDEPENDENT_AMBULATORY_CARE_PROVIDER_SITE_OTHER): Payer: Medicare Other | Admitting: Interventional Cardiology

## 2016-11-30 ENCOUNTER — Encounter: Payer: Self-pay | Admitting: Interventional Cardiology

## 2016-11-30 VITALS — BP 140/62 | HR 79 | Ht 64.0 in | Wt 142.8 lb

## 2016-11-30 DIAGNOSIS — M79605 Pain in left leg: Secondary | ICD-10-CM

## 2016-11-30 DIAGNOSIS — I2510 Atherosclerotic heart disease of native coronary artery without angina pectoris: Secondary | ICD-10-CM | POA: Diagnosis not present

## 2016-11-30 DIAGNOSIS — I482 Chronic atrial fibrillation, unspecified: Secondary | ICD-10-CM

## 2016-11-30 DIAGNOSIS — I5041 Acute combined systolic (congestive) and diastolic (congestive) heart failure: Secondary | ICD-10-CM | POA: Diagnosis not present

## 2016-11-30 DIAGNOSIS — M79604 Pain in right leg: Secondary | ICD-10-CM

## 2016-11-30 DIAGNOSIS — I10 Essential (primary) hypertension: Secondary | ICD-10-CM

## 2016-11-30 MED ORDER — ATORVASTATIN CALCIUM 20 MG PO TABS: 20.0000 mg | ORAL_TABLET | Freq: Every day | ORAL | 3 refills | Status: DC

## 2016-11-30 MED ORDER — APIXABAN 2.5 MG PO TABS: 2.5000 mg | ORAL_TABLET | Freq: Two times a day (BID) | ORAL | 3 refills | Status: DC

## 2016-11-30 NOTE — Patient Instructions (Addendum)
Medication Instructions:  1) DECREASE Atorvastatin to 20mg  once daily. 2) STOP Aspirin 3) START Eliquis 2.5mg  twice daily  Labwork: None  Testing/Procedures: Your physician has requested that you have a lower extremity arterial duplex. This test is an ultrasound of the arteries in the legs. It looks at arterial blood flow in the legs. Allow one hour for Lower and Upper Arterial scans. There are no restrictions or special instructions   Follow-Up: Your physician recommends that you schedule a follow-up appointment in: 2 months with Dr. Katrinka BlazingSmith.   Any Other Special Instructions Will Be Listed Below (If Applicable).  If you begin to notice that you are more short of breath, please feel free to contact our office.    If you need a refill on your cardiac medications before your next appointment, please call your pharmacy.

## 2016-12-14 ENCOUNTER — Other Ambulatory Visit: Payer: Self-pay | Admitting: Interventional Cardiology

## 2016-12-14 DIAGNOSIS — M79605 Pain in left leg: Principal | ICD-10-CM

## 2016-12-14 DIAGNOSIS — M79604 Pain in right leg: Secondary | ICD-10-CM

## 2016-12-17 ENCOUNTER — Ambulatory Visit (HOSPITAL_COMMUNITY)
Admission: RE | Admit: 2016-12-17 | Discharge: 2016-12-17 | Disposition: A | Discharge status: Home / Self Care | Payer: Medicare Other | Source: Ambulatory Visit | Attending: Cardiology | Admitting: Cardiology

## 2016-12-17 DIAGNOSIS — M79605 Pain in left leg: Secondary | ICD-10-CM | POA: Diagnosis not present

## 2016-12-17 DIAGNOSIS — M79604 Pain in right leg: Secondary | ICD-10-CM | POA: Diagnosis not present

## 2016-12-17 DIAGNOSIS — R9439 Abnormal result of other cardiovascular function study: Secondary | ICD-10-CM | POA: Insufficient documentation

## 2017-02-05 ENCOUNTER — Ambulatory Visit (INDEPENDENT_AMBULATORY_CARE_PROVIDER_SITE_OTHER): Payer: Medicare Other | Admitting: Interventional Cardiology

## 2017-02-05 ENCOUNTER — Encounter: Payer: Self-pay | Admitting: Interventional Cardiology

## 2017-02-05 VITALS — BP 140/64 | HR 63 | Ht 64.0 in | Wt 139.1 lb

## 2017-02-05 DIAGNOSIS — I5041 Acute combined systolic (congestive) and diastolic (congestive) heart failure: Secondary | ICD-10-CM | POA: Diagnosis not present

## 2017-02-05 DIAGNOSIS — N183 Chronic kidney disease, stage 3 unspecified: Secondary | ICD-10-CM

## 2017-02-05 DIAGNOSIS — I2510 Atherosclerotic heart disease of native coronary artery without angina pectoris: Secondary | ICD-10-CM

## 2017-02-05 DIAGNOSIS — I482 Chronic atrial fibrillation, unspecified: Secondary | ICD-10-CM

## 2017-02-05 DIAGNOSIS — I10 Essential (primary) hypertension: Secondary | ICD-10-CM

## 2017-02-05 DIAGNOSIS — I252 Old myocardial infarction: Secondary | ICD-10-CM | POA: Diagnosis not present

## 2017-02-05 NOTE — Progress Notes (Signed)
Cardiology Office Note    Date:  02/05/2017   ID:  Chelsea Fuller, DOB September 16, 1924, MRN [id number]  PCP:  Enrique Sack, MD  Cardiologist: Lesleigh Noe, MD   Chief Complaint  Patient presents with  . Coronary Artery Disease    History of Present Illness:  Chelsea Fuller is a 83 y.o. female with a history of hypertension, hypothyroidism, chronic atrial fibrillation (felt too high risk for [facility]), CKD and stroke (2010) and recently diagnosed CAD w/ STEMI s/p DESx2 to LAD. Initial EF 25% improved to greater than 50% by follow-up echo.Post infarct readmission to the hospital with acute on chronic diastolic heart failure requiring the addition of diuretic therapy.  She is doing well. She denies chest pain. She does have some exertional dyspnea/chest fullness with moderate physical activity. If she stops and rests for a few minutes things improve. No excessive bleeding on dual therapy with NOAC and PLAVIX. She had 2 very long stents placed and therefore is at increased risk for stent thrombosis.  Past Medical History:  Diagnosis Date  . CAD (coronary artery disease)    a. 07/2016: anterior STEMI s/p DES x2 to LAD  . CKD (chronic kidney disease), stage III   . CVA (cerebral infarction) 2010  . Fall    a. 2014: nasal bone fx.  . Hypertension   . Hypothyroidism   . Ischemic cardiomyopathy    a. EF 25% by cath 08/19/16 then echo showed EF 50-55% 08/20/16.  . Mitral regurgitation    a. mild by echo 07/2016.  . Mitral stenosis    a. mild by echo 07/2016.  Marland Kitchen Permanent atrial fibrillation (HCC)    a. not presently on anticoagulation due to STEMI 07/2016 and risk of triple therapy with DAPT + anticoag.  . Tricuspid regurgitation    a. mod by echo 07/2016.    Past Surgical History:  Procedure Laterality Date  . CARDIAC CATHETERIZATION N/A 08/19/2016   Procedure: Left Heart Cath and Coronary Angiography;  Surgeon: Lyn Records, MD;  LM 30%, pLAD 50%, dLAD 100% s/p  Synergy  2.5 x 24 mm DES, RCA 20%  . CARDIAC CATHETERIZATION N/A 08/19/2016   Procedure: Coronary Stent Intervention;  Surgeon: Lyn Records, MD;  SYNERGY DES 2.5X24 drug eluting stent  . CHOLECYSTECTOMY      Current Medications: Outpatient Medications Prior to Visit  Medication Sig Dispense Refill  . acetaminophen (TYLENOL) 500 MG tablet Take 500 mg by mouth at bedtime as needed for mild pain.     Marland Kitchen apixaban (ELIQUIS) 2.5 MG TABS tablet Take 1 tablet (2.5 mg total) by mouth 2 (two) times daily. 180 tablet 3  . atorvastatin (LIPITOR) 20 MG tablet Take 1 tablet (20 mg total) by mouth daily. 90 tablet 3  . carvedilol (COREG) 6.25 MG tablet Take 1 tablet (6.25 mg total) by mouth 2 (two) times daily with a meal. 180 tablet 3  . clopidogrel (PLAVIX) 75 MG tablet Take 75 mg by mouth daily.    . furosemide (LASIX) 40 MG tablet Take 1 tablet (40 mg total) by mouth daily. 30 tablet 0  . levothyroxine (SYNTHROID, LEVOTHROID) 88 MCG tablet Take 88 mcg by mouth daily before breakfast.    . loteprednol (ALREX) 0.2 % SUSP Place 1 drop into both eyes 2 (two) times daily.    . multivitamin-lutein (OCUVITE-LUTEIN) CAPS capsule Take 1 capsule by mouth daily.    . nitroGLYCERIN (NITROSTAT) 0.4 MG SL tablet Place 0.4 mg under  the tongue every 5 (five) minutes as needed for chest pain.     . potassium chloride 20 MEQ TBCR Take 20 mEq by mouth daily. 30 tablet 0   No facility-administered medications prior to visit.      Allergies:   Patient has no known allergies.   Social History   Social History  . Marital status: Widowed    Spouse name: N/A  . Number of children: N/A  . Years of education: N/A   Social History Main Topics  . Smoking status: Never Smoker  . Smokeless tobacco: Never Used  . Alcohol use No  . Drug use: No  . Sexual activity: Not Currently   Other Topics Concern  . None   Social History Narrative   Lives independently     Family History:  The patient's family history includes  Diabetes Mellitus II in her son.   ROS:   Please see the history of present illness.    Fatigue. Some difficulty with sleep. Vision disturbance, decreased hearing, fatigue, difficulty with balance, and easy bruising.  All other systems reviewed and are negative.   PHYSICAL EXAM:   VS:  BP 140/64 (BP Location: Right Arm)   Pulse 63   Ht  (1.626 m)   Wt 139 lb 1.9 oz (63.1 kg)   BMI 23.88 kg/m    GEN: Well nourished, well developed, in no acute distress  HEENT: normal  Neck: no JVD, carotid bruits, or masses Cardiac: RRR; no murmurs, rubs, or gallops,no edema  Respiratory:  clear to auscultation bilaterally, normal work of breathing GI: soft, nontender, nondistended, + BS MS: no deformity or atrophy  Skin: warm and dry, no rash Neuro:  Alert and Oriented x 3, Strength and sensation are intact Psych: euthymic mood, full affect  Wt Readings from Last 3 Encounters:  02/05/17 139 lb 1.9 oz (63.1 kg)  11/30/16 142 lb 12.8 oz (64.8 kg)  10/05/16 142 lb 8 oz (64.6 kg)      Studies/Labs Reviewed:   EKG:  EKG  Not repeated. Last tracing from 09/24/16 revealed atrial fibrillation with controlled ventricular response.  Recent Labs: 08/19/2016: ALT 16; Magnesium 1.7; TSH 3.835 09/23/2016: B Natriuretic Peptide 1,073.4; Hemoglobin 11.5; Platelets 167 10/23/2016: BUN 17; Creatinine, Ser 1.22; Potassium 4.7; Sodium 146   Lipid Panel    Component Value Date/Time   CHOL 187 08/19/2016 0328   TRIG 52 08/19/2016 0328   HDL 64 08/19/2016 0328   CHOLHDL 2.9 08/19/2016 0328   VLDL 10 08/19/2016 0328   LDLCALC 113 (H) 08/19/2016 0328    Additional studies/ records that were reviewed today include:  No new data    ASSESSMENT:    1. Acute combined systolic and diastolic heart failure (HCC)   2. CAD in native artery   3. Chronic atrial fibrillation (HCC)   4. Essential hypertension   5. CKD (chronic kidney disease), stage III   6. Chronic diastolic heart failure (HCC)   7. Old  anterior ST elevation MI      PLAN:  In order of problems listed above:  1. Resolved to chronic diastolic heart failure with no evidence of volume overload currently. 2. Stable without requirement for nitroglycerin. Moderate activity causes mild chest tightness which may be angina. A goes away promptly with rest. 3. Controlled rate. Lower dose Eliquis without complications. 4. Well controlled blood pressure 5. Basic metabolic panel will be obtained today to assess kidney function and electrolytes.  Clinical follow-up in 6 months. At  that time Plavix will likely be discontinued and only going forward.  Medication Adjustments/Labs and Tests Ordered: Current medicines are reviewed at length with the patient today.  Concerns regarding medicines are outlined above.  Medication changes, Labs and Tests ordered today are listed in the Patient Instructions below. Patient Instructions  Medication Instructions:  None  Labwork: BMET today  Testing/Procedures: None  Follow-Up: Your physician wants you to follow-up in: 6 months with Dr. Katrinka Blazing. You will receive a reminder letter in the mail two months in advance. If you don't receive a letter, please call our office to schedule the follow-up appointment.   Any Other Special Instructions Will Be Listed Below (If Applicable).     If you need a refill on your cardiac medications before your next appointment, please call your pharmacy.      Signed, Lesleigh Noe, MD  02/05/2017 11:03 AM    [facility] [address] [place], [place], [place]  [address] Phone: [phone]; Fax: [phone]

## 2017-02-05 NOTE — Patient Instructions (Signed)
Medication Instructions:  None  Labwork: BMET today  Testing/Procedures: None  Follow-Up: Your physician wants you to follow-up in: 6 months with Dr. Smith. You will receive a reminder letter in the mail two months in advance. If you don't receive a letter, please call our office to schedule the follow-up appointment.   Any Other Special Instructions Will Be Listed Below (If Applicable).     If you need a refill on your cardiac medications before your next appointment, please call your pharmacy.   

## 2017-02-06 LAB — BASIC METABOLIC PANEL
BUN/Creatinine Ratio: 13 (ref 12–28)
BUN: 15 mg/dL (ref 10–36)
CALCIUM: 9.3 mg/dL (ref 8.7–10.3)
CO2: 28 mmol/L (ref 18–29)
Chloride: 96 mmol/L (ref 96–106)
Creatinine, Ser: 1.13 mg/dL — ABNORMAL HIGH (ref 0.57–1.00)
GFR, EST AFRICAN AMERICAN: 49 mL/min/{1.73_m2} — AB (ref >59)
GFR, EST NON AFRICAN AMERICAN: 43 mL/min/{1.73_m2} — AB (ref >59)
Glucose: 102 mg/dL — ABNORMAL HIGH (ref 65–99)
POTASSIUM: 4 mmol/L (ref 3.5–5.2)
Sodium: 140 mmol/L (ref 134–144)

## 2017-02-11 ENCOUNTER — Other Ambulatory Visit: Payer: Self-pay | Admitting: Internal Medicine

## 2017-02-11 DIAGNOSIS — R310 Gross hematuria: Secondary | ICD-10-CM

## 2017-02-13 ENCOUNTER — Ambulatory Visit
Admission: RE | Admit: 2017-02-13 | Discharge: 2017-02-13 | Disposition: A | Discharge status: Home / Self Care | Payer: Medicare Other | Source: Ambulatory Visit | Attending: Internal Medicine | Admitting: Internal Medicine

## 2017-02-13 DIAGNOSIS — R310 Gross hematuria: Secondary | ICD-10-CM

## 2017-08-12 NOTE — Progress Notes (Signed)
Cardiology Office Note    Date:  08/12/2017   ID:  Chelsea Fuller, DOB 1924/09/01, MRN [id number]  PCP:  Nila Nephew, MD  Cardiologist: Lesleigh Noe, MD   No chief complaint on file.   History of Present Illness:  Chelsea Fuller is a 83 y.o. female with a history of hypertension, chronic diastolic HF, hypothyroidism, chronic atrial fibrillation (felt too high risk for [facility]), CKD and stroke (2010) and recently diagnosed CAD w/ STEMI s/p DESx2 to LAD. Initial EF 25% improved to greater than 50%.  She states that some evenings there is a tightness in her chest that she equates with indigestion. The daughter states that these are not specifically occurring shortly after eating but can occur later in the evening. No associated shortness of breath has been noted. This feeling is not precipitated by activity.  Moderate physical activity such as taking trash pales to the street and cause significant shortness of breath. She still lives independently   Past Medical History:  Diagnosis Date  . CAD (coronary artery disease)    a. 07/2016: anterior STEMI s/p DES x2 to LAD  . CKD (chronic kidney disease), stage III   . CVA (cerebral infarction) 2010  . Fall    a. 2014: nasal bone fx.  . Hypertension   . Hypothyroidism   . Ischemic cardiomyopathy    a. EF 25% by cath 08/19/16 then echo showed EF 50-55% 08/20/16.  . Mitral regurgitation    a. mild by echo 07/2016.  . Mitral stenosis    a. mild by echo 07/2016.  Marland Kitchen Permanent atrial fibrillation (HCC)    a. not presently on anticoagulation due to STEMI 07/2016 and risk of triple therapy with DAPT + anticoag.  . Tricuspid regurgitation    a. mod by echo 07/2016.    Past Surgical History:  Procedure Laterality Date  . CARDIAC CATHETERIZATION N/A 08/19/2016   Procedure: Left Heart Cath and Coronary Angiography;  Surgeon: Lyn Records, MD;  LM 30%, pLAD 50%, dLAD 100% s/p  Synergy 2.5 x 24 mm DES, RCA 20%  . CARDIAC  CATHETERIZATION N/A 08/19/2016   Procedure: Coronary Stent Intervention;  Surgeon: Lyn Records, MD;  SYNERGY DES 2.5X24 drug eluting stent  . CHOLECYSTECTOMY      Current Medications: Outpatient Medications Prior to Visit  Medication Sig Dispense Refill  . acetaminophen (TYLENOL) 500 MG tablet Take 500 mg by mouth at bedtime as needed for mild pain.     Marland Kitchen apixaban (ELIQUIS) 2.5 MG TABS tablet Take 1 tablet (2.5 mg total) by mouth 2 (two) times daily. 180 tablet 3  . atorvastatin (LIPITOR) 20 MG tablet Take 1 tablet (20 mg total) by mouth daily. 90 tablet 3  . carvedilol (COREG) 6.25 MG tablet Take 1 tablet (6.25 mg total) by mouth 2 (two) times daily with a meal. 180 tablet 3  . clopidogrel (PLAVIX) 75 MG tablet Take 75 mg by mouth daily.    . furosemide (LASIX) 40 MG tablet Take 1 tablet (40 mg total) by mouth daily. 30 tablet 0  . levothyroxine (SYNTHROID, LEVOTHROID) 88 MCG tablet Take 88 mcg by mouth daily before breakfast.    . loteprednol (ALREX) 0.2 % SUSP Place 1 drop into both eyes 2 (two) times daily.    . multivitamin-lutein (OCUVITE-LUTEIN) CAPS capsule Take 1 capsule by mouth daily.    . nitroGLYCERIN (NITROSTAT) 0.4 MG SL tablet Place 0.4 mg under the tongue every 5 (five) minutes as needed  for chest pain.     . potassium chloride 20 MEQ TBCR Take 20 mEq by mouth daily. 30 tablet 0   No facility-administered medications prior to visit.      Allergies:   Patient has no known allergies.   Social History   Social History  . Marital status: Widowed    Spouse name: N/A  . Number of children: N/A  . Years of education: N/A   Social History Main Topics  . Smoking status: Never Smoker  . Smokeless tobacco: Never Used  . Alcohol use No  . Drug use: No  . Sexual activity: Not Currently   Other Topics Concern  . Not on file   Social History Narrative   Lives independently     Family History:  The patient's family history includes Diabetes Mellitus II in her son.    ROS:   Please see the history of present illness.    Vision disturbance, shortness of breath, excessive fatigue, difficulty with balance, and most concerning her is easy bruising and skin discoloration secondary.  All other systems reviewed and are negative.   PHYSICAL EXAM:   VS:  There were no vitals taken for this visit.   GEN: Well nourished, well developed, in no acute distress  HEENT: normal  Neck: no JVD, carotid bruits, or masses Cardiac: RRR; no murmurs, rubs, or gallops,no edema  Respiratory:  clear to auscultation bilaterally, normal work of breathing GI: soft, nontender, nondistended, + BS MS: no deformity or atrophy  Skin: warm and dry, no rash Neuro:  Alert and Oriented x 3, Strength and sensation are intact Psych: euthymic mood, full affect  Wt Readings from Last 3 Encounters:  02/05/17 139 lb 1.9 oz (63.1 kg)  11/30/16 142 lb 12.8 oz (64.8 kg)  10/05/16 142 lb 8 oz (64.6 kg)      Studies/Labs Reviewed:   EKG:  EKG  Chronic atrial fibrillation, rate 53 bpm, Paul wave progression V1 through V3, otherwise unremarkable. No acute change compared to prior.  Recent Labs: 08/19/2016: ALT 16; Magnesium 1.7; TSH 3.835 09/23/2016: B Natriuretic Peptide 1,073.4; Hemoglobin 11.5; Platelets 167 02/05/2017: BUN 15; Creatinine, Ser 1.13; Potassium 4.0; Sodium 140   Lipid Panel    Component Value Date/Time   CHOL 187 08/19/2016 0328   TRIG 52 08/19/2016 0328   HDL 64 08/19/2016 0328   CHOLHDL 2.9 08/19/2016 0328   VLDL 10 08/19/2016 0328   LDLCALC 113 (H) 08/19/2016 0328    Additional studies/ records that were reviewed today include:  None    ASSESSMENT:    1. Chronic atrial fibrillation (HCC)   2. Chronic diastolic heart failure (HCC)   3. CAD in native artery   4. CKD (chronic kidney disease), stage III (HCC)      PLAN:  In order of problems listed above:  1. Controlled with relatively slow rate. May eventually decreased diltiazem dose. 2. No  evidence of volume overload on clinical exam. 3. Chest tightness may be angina. Discussed using nitroglycerin while seated for episodes that last longer than 5 minutes. 4. Not specifically addressed. 5. Elevated blood pressure this morning likely related to absence of furosemide dose this morning. She doesn't take it if she has to go out until she returns home from activities.  Plan to follow-up with team extender in 4-6 months. Follow-up with me in 9-12 months. Use nitroglycerin as needed. Discontinue Plavix. Aspirin 81 mg Monday Wednesday and Friday.In 6 months aspirin can be discontinued.  Medication Adjustments/Labs and  Tests Ordered: Current medicines are reviewed at length with the patient today.  Concerns regarding medicines are outlined above.  Medication changes, Labs and Tests ordered today are listed in the Patient Instructions below. There are no Patient Instructions on file for this visit.   Signed, Lesleigh Noe, MD  08/12/2017 5:40 PM    [facility] [address] [place], [place], [place]  [address] Phone: [phone]; Fax: [phone]

## 2017-08-13 ENCOUNTER — Encounter: Payer: Self-pay | Admitting: Interventional Cardiology

## 2017-08-13 ENCOUNTER — Ambulatory Visit (INDEPENDENT_AMBULATORY_CARE_PROVIDER_SITE_OTHER): Payer: Medicare Other | Admitting: Interventional Cardiology

## 2017-08-13 VITALS — BP 170/60 | HR 53 | Ht 64.0 in | Wt 141.4 lb

## 2017-08-13 DIAGNOSIS — I482 Chronic atrial fibrillation, unspecified: Secondary | ICD-10-CM

## 2017-08-13 DIAGNOSIS — I2510 Atherosclerotic heart disease of native coronary artery without angina pectoris: Secondary | ICD-10-CM

## 2017-08-13 DIAGNOSIS — I5032 Chronic diastolic (congestive) heart failure: Secondary | ICD-10-CM

## 2017-08-13 DIAGNOSIS — N183 Chronic kidney disease, stage 3 unspecified: Secondary | ICD-10-CM

## 2017-08-13 MED ORDER — ASPIRIN EC 81 MG PO TBEC: 81.0000 mg | DELAYED_RELEASE_TABLET | ORAL | 3 refills | Status: DC

## 2017-08-13 NOTE — Patient Instructions (Addendum)
Medication Instructions:  1) DISCONTINUE Plavix 2) Use your Nitro if you are having shortness or breath when resting or having chest tightness.  Make sure you are sitting if you do this. 3) START Aspirin 81mg  once daily on Monday, Wednesday and Friday.   Labwork: None  Testing/Procedures: None  Follow-Up: Your physician recommends that you schedule a follow-up appointment in: 4-6 months with a PA or NP on Dr. Michaelle CopasSmith's team.  Your physician wants you to follow-up in: 9-12 months with Dr. Katrinka BlazingSmith.  You will receive a reminder letter in the mail two months in advance. If you don't receive a letter, please call our office to schedule the follow-up appointment.    Any Other Special Instructions Will Be Listed Below (If Applicable).     If you need a refill on your cardiac medications before your next appointment, please call your pharmacy.

## 2017-08-15 ENCOUNTER — Telehealth: Payer: Self-pay | Admitting: Interventional Cardiology

## 2017-08-15 NOTE — Telephone Encounter (Signed)
New message     1) Are you calling to confirm a diagnosis or obtain personal health information (Y/N)? yes  2) If so, what information is requested? Last bp and date that was attained and EF percentage and date that was attained   Fax [phone] attn ashley [place]  Please route to Medical Records or your medical records site representative

## 2017-08-15 NOTE — Telephone Encounter (Signed)
Left [employer] rep message need EF % faxed over.

## 2017-08-17 ENCOUNTER — Other Ambulatory Visit: Payer: Self-pay | Admitting: Physician Assistant

## 2017-10-30 ENCOUNTER — Emergency Department (HOSPITAL_COMMUNITY): Payer: Medicare Other

## 2017-10-30 ENCOUNTER — Inpatient Hospital Stay (HOSPITAL_COMMUNITY)
Admission: EM | Admit: 2017-10-30 | Discharge: 2017-11-01 | DRG: 291 | Disposition: A | Discharge status: Home Health | Payer: Medicare Other | Attending: Nephrology | Admitting: Nephrology

## 2017-10-30 ENCOUNTER — Other Ambulatory Visit: Payer: Self-pay

## 2017-10-30 ENCOUNTER — Encounter (HOSPITAL_COMMUNITY): Payer: Self-pay

## 2017-10-30 DIAGNOSIS — N183 Chronic kidney disease, stage 3 unspecified: Secondary | ICD-10-CM | POA: Diagnosis present

## 2017-10-30 DIAGNOSIS — I361 Nonrheumatic tricuspid (valve) insufficiency: Secondary | ICD-10-CM | POA: Diagnosis not present

## 2017-10-30 DIAGNOSIS — I25119 Atherosclerotic heart disease of native coronary artery with unspecified angina pectoris: Secondary | ICD-10-CM | POA: Diagnosis present

## 2017-10-30 DIAGNOSIS — J189 Pneumonia, unspecified organism: Secondary | ICD-10-CM | POA: Diagnosis present

## 2017-10-30 DIAGNOSIS — I5043 Acute on chronic combined systolic (congestive) and diastolic (congestive) heart failure: Secondary | ICD-10-CM | POA: Diagnosis present

## 2017-10-30 DIAGNOSIS — I272 Pulmonary hypertension, unspecified: Secondary | ICD-10-CM | POA: Diagnosis present

## 2017-10-30 DIAGNOSIS — I2510 Atherosclerotic heart disease of native coronary artery without angina pectoris: Secondary | ICD-10-CM | POA: Diagnosis present

## 2017-10-30 DIAGNOSIS — E785 Hyperlipidemia, unspecified: Secondary | ICD-10-CM | POA: Diagnosis present

## 2017-10-30 DIAGNOSIS — Z955 Presence of coronary angioplasty implant and graft: Secondary | ICD-10-CM

## 2017-10-30 DIAGNOSIS — Z79899 Other long term (current) drug therapy: Secondary | ICD-10-CM

## 2017-10-30 DIAGNOSIS — E039 Hypothyroidism, unspecified: Secondary | ICD-10-CM | POA: Diagnosis present

## 2017-10-30 DIAGNOSIS — I5033 Acute on chronic diastolic (congestive) heart failure: Secondary | ICD-10-CM

## 2017-10-30 DIAGNOSIS — Z7982 Long term (current) use of aspirin: Secondary | ICD-10-CM | POA: Diagnosis not present

## 2017-10-30 DIAGNOSIS — I252 Old myocardial infarction: Secondary | ICD-10-CM | POA: Diagnosis not present

## 2017-10-30 DIAGNOSIS — Z8673 Personal history of transient ischemic attack (TIA), and cerebral infarction without residual deficits: Secondary | ICD-10-CM | POA: Diagnosis not present

## 2017-10-30 DIAGNOSIS — R509 Fever, unspecified: Secondary | ICD-10-CM | POA: Diagnosis not present

## 2017-10-30 DIAGNOSIS — I482 Chronic atrial fibrillation: Secondary | ICD-10-CM | POA: Diagnosis present

## 2017-10-30 DIAGNOSIS — I255 Ischemic cardiomyopathy: Secondary | ICD-10-CM | POA: Diagnosis present

## 2017-10-30 DIAGNOSIS — R079 Chest pain, unspecified: Secondary | ICD-10-CM | POA: Diagnosis present

## 2017-10-30 DIAGNOSIS — I130 Hypertensive heart and chronic kidney disease with heart failure and stage 1 through stage 4 chronic kidney disease, or unspecified chronic kidney disease: Principal | ICD-10-CM | POA: Diagnosis present

## 2017-10-30 DIAGNOSIS — I5023 Acute on chronic systolic (congestive) heart failure: Secondary | ICD-10-CM | POA: Diagnosis not present

## 2017-10-30 DIAGNOSIS — R0902 Hypoxemia: Secondary | ICD-10-CM | POA: Diagnosis present

## 2017-10-30 DIAGNOSIS — R0602 Shortness of breath: Secondary | ICD-10-CM

## 2017-10-30 DIAGNOSIS — Z7901 Long term (current) use of anticoagulants: Secondary | ICD-10-CM

## 2017-10-30 DIAGNOSIS — Z66 Do not resuscitate: Secondary | ICD-10-CM | POA: Diagnosis present

## 2017-10-30 DIAGNOSIS — Z79890 Hormone replacement therapy (postmenopausal): Secondary | ICD-10-CM

## 2017-10-30 DIAGNOSIS — I4821 Permanent atrial fibrillation: Secondary | ICD-10-CM | POA: Diagnosis present

## 2017-10-30 DIAGNOSIS — I10 Essential (primary) hypertension: Secondary | ICD-10-CM | POA: Diagnosis not present

## 2017-10-30 LAB — CBC
HCT: 37.4 % (ref 36.0–46.0)
HEMOGLOBIN: 11.4 g/dL — AB (ref 12.0–15.0)
MCH: 30 pg (ref 26.0–34.0)
MCHC: 30.5 g/dL (ref 30.0–36.0)
MCV: 98.4 fL (ref 78.0–100.0)
Platelets: 145 10*3/uL — ABNORMAL LOW (ref 150–400)
RBC: 3.8 MIL/uL — ABNORMAL LOW (ref 3.87–5.11)
RDW: 13.4 % (ref 11.5–15.5)
WBC: 8.4 10*3/uL (ref 4.0–10.5)

## 2017-10-30 LAB — URINALYSIS, DIPSTICK ONLY
Bilirubin Urine: NEGATIVE
Glucose, UA: NEGATIVE mg/dL
Ketones, ur: NEGATIVE mg/dL
LEUKOCYTES UA: NEGATIVE
Nitrite: NEGATIVE
PROTEIN: NEGATIVE mg/dL
pH: 6 (ref 5.0–8.0)

## 2017-10-30 LAB — BASIC METABOLIC PANEL
ANION GAP: 7 (ref 5–15)
BUN: 16 mg/dL (ref 6–20)
CHLORIDE: 105 mmol/L (ref 101–111)
CO2: 28 mmol/L (ref 22–32)
Calcium: 9 mg/dL (ref 8.9–10.3)
Creatinine, Ser: 1.25 mg/dL — ABNORMAL HIGH (ref 0.44–1.00)
GFR calc Af Amer: 42 mL/min — ABNORMAL LOW (ref >60)
GFR calc non Af Amer: 36 mL/min — ABNORMAL LOW (ref >60)
GLUCOSE: 149 mg/dL — AB (ref 65–99)
POTASSIUM: 4.5 mmol/L (ref 3.5–5.1)
Sodium: 140 mmol/L (ref 135–145)

## 2017-10-30 LAB — I-STAT TROPONIN, ED: Troponin i, poc: 0.05 ng/mL (ref 0.00–0.08)

## 2017-10-30 LAB — TROPONIN I
TROPONIN I: 0.18 ng/mL — AB (ref <0.03)
TROPONIN I: 0.24 ng/mL — AB (ref <0.03)
Troponin I: 0.25 ng/mL (ref <0.03)

## 2017-10-30 LAB — BRAIN NATRIURETIC PEPTIDE: B Natriuretic Peptide: 661.1 pg/mL — ABNORMAL HIGH (ref 0.0–100.0)

## 2017-10-30 MED ORDER — NITROGLYCERIN 0.4 MG SL SUBL: 0.4000 mg | SUBLINGUAL_TABLET | SUBLINGUAL | Status: DC | PRN

## 2017-10-30 MED ORDER — ASPIRIN EC 81 MG PO TBEC
81.0000 mg | DELAYED_RELEASE_TABLET | ORAL | Status: DC
  Administered 2017-11-01: 81 mg via ORAL
  Filled 2017-10-30: qty 1

## 2017-10-30 MED ORDER — FUROSEMIDE 10 MG/ML IJ SOLN
40.0000 mg | Freq: Every day | INTRAMUSCULAR | Status: DC
  Administered 2017-10-31: 40 mg via INTRAVENOUS
  Filled 2017-10-30: qty 4

## 2017-10-30 MED ORDER — ONDANSETRON HCL 4 MG/2ML IJ SOLN: 4.0000 mg | Freq: Four times a day (QID) | INTRAMUSCULAR | Status: DC | PRN

## 2017-10-30 MED ORDER — ATORVASTATIN CALCIUM 20 MG PO TABS
20.0000 mg | ORAL_TABLET | Freq: Every day | ORAL | Status: DC
  Administered 2017-10-30 – 2017-10-31 (×2): 20 mg via ORAL
  Filled 2017-10-30 (×2): qty 1

## 2017-10-30 MED ORDER — APIXABAN 2.5 MG PO TABS
2.5000 mg | ORAL_TABLET | Freq: Two times a day (BID) | ORAL | Status: DC
  Administered 2017-10-30 – 2017-11-01 (×4): 2.5 mg via ORAL
  Filled 2017-10-30 (×5): qty 1

## 2017-10-30 MED ORDER — FUROSEMIDE 10 MG/ML IJ SOLN
20.0000 mg | Freq: Once | INTRAMUSCULAR | Status: AC
  Administered 2017-10-30: 20 mg via INTRAVENOUS
  Filled 2017-10-30: qty 2

## 2017-10-30 MED ORDER — LEVOTHYROXINE SODIUM 88 MCG PO TABS
88.0000 ug | ORAL_TABLET | ORAL | Status: DC
  Administered 2017-10-30 – 2017-11-01 (×3): 88 ug via ORAL
  Filled 2017-10-30 (×4): qty 1

## 2017-10-30 MED ORDER — ACETAMINOPHEN 325 MG PO TABS
650.0000 mg | ORAL_TABLET | ORAL | Status: DC | PRN
  Filled 2017-10-30: qty 2

## 2017-10-30 MED ORDER — ACETAMINOPHEN 325 MG PO TABS: 650.0000 mg | ORAL_TABLET | ORAL | Status: DC | PRN

## 2017-10-30 MED ORDER — LEVOFLOXACIN 250 MG PO TABS: 250.0000 mg | ORAL_TABLET | Freq: Every day | ORAL | 0 refills | Status: DC

## 2017-10-30 NOTE — Consult Note (Signed)
Cardiology Consultation:   Patient ID: Chelsea Fuller; [id number]; 1923/12/20   Admit date: 10/30/2017 Date of Consult: 10/30/2017  Primary Care Provider: Nila Nephew, MD Primary Cardiologist: Lesleigh Noe, MD  Primary Electrophysiologist:     Patient Profile:   Chelsea Fuller is a 84 y.o. female with a hx of HTN, chronic systolic and diastolic heart failure, hypothyroidism, chronic Afib on eliquis, CKD stage 3, and stroke (2010), CAD with STEMI s/p DES x 2 to LAD (2017), now on plavix, no ASA, initial EF of 25% now improved to 40-45% who is being seen today for the evaluation of chest pain and SOB at the request of Dr. Ophelia Charter.  History of Present Illness:   Chelsea Fuller is known to this service and last saw Dr.Smith in clinic on 08/13/17. She still lives independently, but reports some exertional chest pain at times with house chores. PRN nitro was discussed for angina. No other medication changes made at that time.   She was in her usual state of health without SOB or orthopnea until this morning. She woke up with SOB. Denies cough. Reporrts chest pain and "chest fullness." She did not take nitro. She is unsure if her chest pain felt like anginal pain. She is currently chest pain free. She is compliant on her medications at home and is taking eliquis for chronic Afib.  On admission, BNP was elevated, CXR with bilateral pleural effusions. She was also found to have an elevated temp of 100.7.  She denies recent illness or bleeding problems. Troponins are mildly elevated and flat.  Past Medical History:  Diagnosis Date  . CAD (coronary artery disease)    a. 07/2016: anterior STEMI s/p DES x2 to LAD  . CKD (chronic kidney disease), stage III (HCC)   . CVA (cerebral infarction) 2010  . Fall    a. 2014: nasal bone fx.  . Hypertension   . Hypothyroidism   . Ischemic cardiomyopathy    a. EF 25% by cath 08/19/16 then echo showed EF 50-55% 08/20/16.  . Mitral regurgitation    a.  mild by echo 07/2016.  . Mitral stenosis    a. mild by echo 07/2016.  Marland Kitchen Permanent atrial fibrillation (HCC)    a. not presently on anticoagulation due to STEMI 07/2016 and risk of triple therapy with DAPT + anticoag.  . Tricuspid regurgitation    a. mod by echo 07/2016.    Past Surgical History:  Procedure Laterality Date  . CARDIAC CATHETERIZATION N/A 08/19/2016   Procedure: Left Heart Cath and Coronary Angiography;  Surgeon: Lyn Records, MD;  LM 30%, pLAD 50%, dLAD 100% s/p  Synergy 2.5 x 24 mm DES, RCA 20%  . CARDIAC CATHETERIZATION N/A 08/19/2016   Procedure: Coronary Stent Intervention;  Surgeon: Lyn Records, MD;  SYNERGY DES 2.5X24 drug eluting stent  . CHOLECYSTECTOMY       Home Medications:  Prior to Admission medications   Medication Sig Start Date End Date Taking? Authorizing Provider  acetaminophen (TYLENOL) 500 MG tablet Take 500 mg by mouth at bedtime as needed for mild pain.    Yes [provider]  apixaban (ELIQUIS) 2.5 MG TABS tablet Take 1 tablet (2.5 mg total) by mouth 2 (two) times daily. 11/30/16  Yes Lyn Records, MD  aspirin EC 81 MG tablet Take 1 tablet (81 mg total) by mouth every Monday, Wednesday, and Friday. 08/14/17  Yes Lyn Records, MD  atorvastatin (LIPITOR) 20 MG tablet Take 20 mg  by mouth daily.   Yes [provider]  carvedilol (COREG) 6.25 MG tablet TAKE 1 TABLET (6.25 MG TOTAL) BY MOUTH 2 (TWO) TIMES DAILY WITH A MEAL. 08/19/17  Yes Janetta Horahompson, Kathryn R, PA-C  furosemide (LASIX) 40 MG tablet Take 1 tablet (40 mg total) by mouth daily. 09/26/16  Yes Tyrone NineGrunz, Ryan B, MD  levothyroxine (SYNTHROID, LEVOTHROID) 88 MCG tablet Take 88 mcg by mouth See admin instructions. Monday-Friday only   Yes [provider]  loteprednol (ALREX) 0.2 % SUSP Place 1 drop into both eyes 2 (two) times daily.   Yes [provider]  multivitamin-lutein (OCUVITE-LUTEIN) CAPS capsule Take 1 capsule by mouth daily.   Yes [provider]  potassium chloride 20 MEQ TBCR Take 20 mEq by mouth daily. 09/25/16  Yes Tyrone NineGrunz, Ryan B, MD  levofloxacin (LEVAQUIN) 250 MG tablet Take 1 tablet (250 mg total) by mouth daily for 7 days. 10/30/17 11/06/17  Howard PouchFeng, Lauren, MD  nitroGLYCERIN (NITROSTAT) 0.4 MG SL tablet Place 0.4 mg under the tongue every 5 (five) minutes as needed for chest pain.  07/29/16   [provider]    Inpatient Medications: Scheduled Meds: . apixaban  2.5 mg Oral BID  . aspirin EC  81 mg Oral Q M,W,F  . atorvastatin  20 mg Oral Daily  . [START ON 10/31/2017] furosemide  40 mg Intravenous Daily  . levothyroxine  88 mcg Oral See admin instructions   Continuous Infusions:  PRN Meds: acetaminophen, nitroGLYCERIN, ondansetron (ZOFRAN) IV  Allergies:   No Known Allergies  Social History:   Social History   Socioeconomic History  . Marital status: Widowed    Spouse name: Not on file  . Number of children: Not on file  . Years of education: Not on file  . Highest education level: Not on file  Social Needs  . Financial resource strain: Not on file  . Food insecurity - worry: Not on file  . Food insecurity - inability: Not on file  . Transportation needs - medical: Not on file  . Transportation needs - non-medical: Not on file  Occupational History  . Not on file  Tobacco Use  . Smoking status: Never Smoker  . Smokeless tobacco: Never Used  Substance and Sexual Activity  . Alcohol use: No  . Drug use: No  . Sexual activity: Not Currently  Other Topics Concern  . Not on file  Social History Narrative   Lives independently    Family History:    Family History  Problem Relation Age of Onset  . Diabetes Mellitus II Son      ROS:  Please see the history of present illness.  ROS  All other ROS reviewed and negative.     Physical Exam/Data:   Vitals:   10/30/17 1145 10/30/17 1230 10/30/17 1245 10/30/17 1330  BP: (!) 141/49 (!) 121/58 (!) 124/51 106/71  Pulse: 66 64 66 69  Resp: 18 (!) 22  (!) 26 (!) 21  Temp:      TempSrc:      SpO2: 92% 90% 90% 93%  Weight:        Intake/Output Summary (Last 24 hours) at 10/30/2017 1829 Last data filed at 10/30/2017 0756 Gross per 24 hour  Intake -  Output 1 ml  Net -1 ml   Filed Weights   10/30/17 0747  Weight: 141 lb (64 kg)   Body mass index is 24.2 kg/m.  General:  Well nourished, well developed, in no acute distress.  Elderly woman HEENT: normal Lymph: no adenopathy Neck: 7cm JVD Endocrine:  No thryomegaly Vascular: No carotid bruits Cardiac:  normal S1, S2; RRR; no murmur Lungs:  Respirations unlabored, rales in bases  Abd: soft, nontender, no hepatomegaly  Ext: trace edema Musculoskeletal:  No deformities, BUE and BLE strength normal and equal Skin: warm and dry  Neuro:  CNs 2-12 intact, no focal abnormalities noted Psych:  Normal affect   EKG:  The EKG was personally reviewed and demonstrates:  sinus Telemetry:  Telemetry was personally reviewed and demonstrates:  Sinus, bouts of Afib  Relevant CV Studies:  Echo 09/25/16: Study Conclusions - Left ventricle: The cavity size was normal. Wall thickness was   normal. Systolic function was mildly to moderately reduced. The   estimated ejection fraction was in the range of 40% to 45%.   Severe hypokinesis of the mid-apicalanteroseptal, anterior, and   apical myocardium; consistent with infarction in the distribution   of the left anterior descending coronary artery. No evidence of   thrombus. - Aortic valve: There was mild regurgitation. - Mitral valve: Calcified annulus. There was mild regurgitation. - Left atrium: The atrium was mildly dilated. - Pulmonary arteries: Systolic pressure was mildly increased. PA   peak pressure: 35 mm Hg (S).   Left heart cath 08/19/16:  Prox RCA to Dist RCA lesion, 20 %stenosed.  Prox LAD lesion, 50 %stenosed.  LM lesion, 30 %stenosed.  A STENT SYNERGY DES 2.25X32 drug eluting stent was successfully placed.  Dist LAD  lesion, 100 %stenosed.  Post intervention, there is a 0% residual stenosis.  A STENT SYNERGY DES 2.5X24 drug eluting stent was successfully placed, and does not overlap previously placed stent.  Mid LAD lesion, 80 %stenosed.  Post intervention, there is a 0% residual stenosis.  There is moderate to severe left ventricular systolic dysfunction.  The left ventricular ejection fraction is 25-35% by visual estimate.  LV end diastolic pressure is moderately elevated.    Acute anterior ST elevation myocardial infarction.  Successful PCI with stenting of the mid LAD from 100% to 0% with a Synergy 2.25 x 32 drug-eluting stent with TIMI grade 3 flow.  Successful primary stenting of a segmental 80% proximal LAD stenosis to 0% using a 2.5 x 24 Synergy drug-eluting stent.  Irregularities noted in the circumflex and right coronary.  Significant anteroapical wall motion abnormality/akinesis with estimated EF 25-35% with LVEDP 24 mmHg.  RECOMMENDATIONS:   Aspirin and Plavix times at least one year  Convert diltiazem to metoprolol for rate control of atrial fibrillation and in the setting of ischemic heart disease.  Follow kidney function closely as patient had pre-contrast exposure chronic kidney disease. A total of 300 cc of contrast was used. She would be at high risk for developing acute kidney injury.  If dyspnea, IV Lasix should be given.  Gentle hydration  At high risk for complications given age ankle morbidities.   Laboratory Data:  Chemistry Recent Labs  Lab 10/30/17 0751  NA 140  K 4.5  CL 105  CO2 28  GLUCOSE 149*  BUN 16  CREATININE 1.25*  CALCIUM 9.0  GFRNONAA 36*  GFRAA 42*  ANIONGAP 7    No results for input(s): PROT, ALBUMIN, AST, ALT, ALKPHOS, BILITOT in the last 168 hours. Hematology Recent Labs  Lab 10/30/17 0751  WBC 8.4  RBC 3.80*  HGB 11.4*  HCT 37.4  MCV 98.4  MCH 30.0  MCHC 30.5  RDW 13.4  PLT 145*   Cardiac Enzymes Recent  Labs    Lab 10/30/17 1234 10/30/17 1530  TROPONINI 0.18* 0.25*    Recent Labs  Lab 10/30/17 0803  TROPIPOC 0.05    BNP Recent Labs  Lab 10/30/17 1254  BNP 661.1*    DDimer No results for input(s): DDIMER in the last 168 hours.  Radiology/Studies:  Dg Chest 2 View  Result Date: 10/30/2017 CLINICAL DATA:  Short of breath chest pain EXAM: CHEST  2 VIEW COMPARISON:  09/23/2016 FINDINGS: Pulmonary hyperinflation with COPD and scarring. Improvement in mild bibasilar airspace disease likely atelectasis. Minimal pleural effusion. Negative for edema. Apical scarring bilaterally. Coronary artery stents. IMPRESSION: COPD with improvement in bibasilar airspace disease. Small pleural effusions. Electronically Signed   By: Marlan Palau M.D.   On: 10/30/2017 08:52    Assessment and Plan:   1. Chest pain, hx of CAD, shortness of breath, mildly elevated troponin, acute on chronic systolic and diastolic heart failure - BNP 661 - troponin 0.18 --> 0.25 - CXR with small bilateral pleural effusions Pt is likely having a mild heart failure exacerbation possibly secondary to a subacute infection. She reports fever at home, but has been afebrile here and without leukocytosis. Troponin elevation likely represents demand ischemia in the setting of acute on chronic systolic and diastolic heart failure. She has received one dose of 20 mg IV lasix. Her home dose is 40 mg daily. Would recommend an additional 20 mg IV lasix tonight and 40 mg IV lasix starting tomorrow. Need strict I&Os and daily weights. Continue statin and plavix.  2. CKD stage 3 - sCr 1.25, baseline 1.13-1.22 - continue with IV lasix - daily labs, replace potassium as needed  3. Chronic Afib - currently in NSR - continue eliquis and coreg   For questions or updates, please contact [facility] Please consult [email or web address] for contact info under Cardiology/STEMI.   Signed, Marcelino Duster, [place]  10/30/2017 6:29 PM   Patient seen,  examined. Available data reviewed. Agree with findings, assessment, and plan as outlined by Bettina Gavia, PA-C.  On exam the patient is an elderly woman in no acute distress.  Jugular venous pressure is 7 cm.  Lungs are clear bilaterally.  Heart is regular rate and rhythm with no murmur or gallop.  Abdomen is soft and nontender.  Extremities show trace edema.  Radiographic data and labs are reviewed.  The patient has a mixed picture of congestive heart failure and possibly an infectious process that she has had a fever.  She has received IV furosemide and feels better with less pressure on her chest.  She also has had some symptoms suggestive of angina.  Her troponin is only minimally elevated with a flat trend and there is no real evidence of ACS.  She has known CAD and I think medical therapy is most appropriate considering her advanced age.  Will follow her clinical course and titrate her diuretics accordingly.  Repeat metabolic panel tomorrow morning.  We will also order an updated echo.  Tonny Bollman, M.D. 10/30/2017 8:29 PM

## 2017-10-30 NOTE — ED Triage Notes (Addendum)
Pt from home via EMS with complaints of chest pain that resolved PTA and SOB. On EMS arrival pt had increased work of breathing. Placed on 4lpm [place] and spo2 increased from 93% to 97%. Lung sounds clear.Denies cough, fevers, chills.   160/90 Hr 82NSR 97% 4lpm Marina  cbg 127  324mg  ASA given PTA

## 2017-10-30 NOTE — ED Provider Notes (Addendum)
She is seen and evaluated.  Discussed with Dr. Mosetta PuttFeng, resident.  She was mild dyspnea this morning.  Much improved here.  Some mild basilar crackles.  No marked increased work of breathing.  Saturating 100% on 4 L.  Will titrate down to 2 L.  Was 93% on room air.  Chest x-ray shows some chronic changes.  Bilateral effusions.?  Interstitial prominence versus possible CHF.  Await BNP.  We will repeat formal troponin.  Will need at least delta troponin.  She was vaccinated for influenza.  Recheck temp 100.7 on my exam.  Plan diuresis.  Reevaluation.   PT ambulated. Sats to 88%. No O2 requirement at rest. Delta troponin elevated at 0.18. BNP 661.   Will discuss with hospitalist re: admit, serial enzymes. Blood cultures requested.        Rolland PorterJames, Dezmen Alcock, MD     10/30/17 [id number]    Rolland PorterJames, Fable Huisman, MD 10/30/17 1451    Rolland PorterJames, Jeilyn Reznik, MD 10/30/17 [phone]

## 2017-10-30 NOTE — ED Notes (Signed)
[place]

## 2017-10-30 NOTE — H&P (Signed)
History and Physical    Chelsea Fuller ZOX:[id number] DOB: 1924/08/16 DOA: 10/30/2017  **Will admit patient based on the expectation that the patient will need hospitalization/ hospital care that crosses at least 2 midnights  PCP: Nila Nephew, MD   Attending physician: Ophelia Charter  Patient coming from/Resides with: Private residence/alone  Chief Complaint: Chest pain with shortness of breath.  HPI: Chelsea Fuller is a 84 y.o. female with medical history significant for known CAD with DES to LAD in 2017, ischemic cardiomyopathy with improvement of EF after revascularization as above, hypothyroidism, hypertension, stage III chronic kidney disease and chronic atrial fibrillation on Eliquis.  Patient completed 1 year of DAPT.  Patient presented to the ER today and married to chest pain and shortness of breath.  Patient took aspirin at home with complete resolution of chest pain.  EMS was called to the home and patient was noted with increased work of breathing. She was placed on 4 L oxygen with pulse oximetry increasing from 93% to 97%.  Upon arrival to the ER chest x-ray without any focal infiltrate and only small bilateral pleural effusions.  Chest pain-free.  Initial poc troponin 0.05 with a BNP of 661.  Subsequent troponin elevated at 0.18.  Patient was given low-dose Lasix and began to diurese and was able to be weaned from oxygen at rest.  Unfortunately with ambulation O2 saturations decreased to 88%.  In further discussion with the patient she periodically has mild chest tightness and states she has never used her nitroglycerin.  Today her chest pain was associated with shortness of breath and this is why she called EMS.  She has noticed progressive dyspnea on exertion for 2 weeks (NOT associated with chest pain) and only trace lower extremity edema w/ no definitive weight gain.  She denies significant dietary indiscretion and reports she did eat a few slices of pickles yesterday.  In 2017  her presenting cardiac ischemic symptoms were severe chest pressure and a sensation of feeling hot and sweaty.  She has not had the symptoms recently.  Note since arrival she has developed low-grade fever with a TM of 100.7.  ED Course:  Vital Signs: BP 106/71   Pulse 69   Temp 99.1 F (37.3 C) (Oral)   Resp (!) 21   Wt 64 kg (141 lb)   SpO2 93%   BMI 24.20 kg/m  CXR: CO2 PD with improvement in bibasilar airspace disease.  Small bilateral pleural effusions Lab data: Sodium 140, potassium 4.5, chloride 105, CO2 28, glucose 149, BUN 16, creatinine 1.25, GFR 36, calcium 9.0, anion gap 7, BNP and troponin as above, white count 8400 differential not obtained, hemoglobin 11.4, platelets 145,000, urinalysis without microscopy with moderate hemoglobin and a specific gravity of <1.005, urine culture pending. Medications and treatments: Lasix 20 mg IV x1  Review of Systems:  In addition to the HPI above,  No Fever until arrival to ER, No chills, myalgias or other constitutional symptoms No Headache, changes with Vision or hearing, new weakness, tingling, numbness in any extremity, dizziness, dysarthria or word finding difficulty, gait disturbance or imbalance, tremors or seizure activity No problems swallowing food or Liquids, indigestion/reflux, choking or coughing while eating, abdominal pain with or after eating No palpitations, orthopnea  No Abdominal pain, N/V, melena,hematochezia, dark tarry stools, constipation No dysuria, malodorous urine, hematuria or flank pain No new skin rashes, lesions, masses or bruises, No new joint pains, aches, swelling or redness No recent unintentional weight gain or loss No  polyuria, polydypsia or polyphagia   Past Medical History:  Diagnosis Date  . CAD (coronary artery disease)    a. 07/2016: anterior STEMI s/p DES x2 to LAD  . CKD (chronic kidney disease), stage III (HCC)   . CVA (cerebral infarction) 2010  . Fall    a. 2014: nasal bone fx.  .  Hypertension   . Hypothyroidism   . Ischemic cardiomyopathy    a. EF 25% by cath 08/19/16 then echo showed EF 50-55% 08/20/16.  . Mitral regurgitation    a. mild by echo 07/2016.  . Mitral stenosis    a. mild by echo 07/2016.  Marland Kitchen Permanent atrial fibrillation (HCC)    a. not presently on anticoagulation due to STEMI 07/2016 and risk of triple therapy with DAPT + anticoag.  . Tricuspid regurgitation    a. mod by echo 07/2016.    Past Surgical History:  Procedure Laterality Date  . CARDIAC CATHETERIZATION N/A 08/19/2016   Procedure: Left Heart Cath and Coronary Angiography;  Surgeon: Lyn Records, MD;  LM 30%, pLAD 50%, dLAD 100% s/p  Synergy 2.5 x 24 mm DES, RCA 20%  . CARDIAC CATHETERIZATION N/A 08/19/2016   Procedure: Coronary Stent Intervention;  Surgeon: Lyn Records, MD;  SYNERGY DES 2.5X24 drug eluting stent  . CHOLECYSTECTOMY      Social History   Socioeconomic History  . Marital status: Widowed    Spouse name: Not on file  . Number of children: Not on file  . Years of education: Not on file  . Highest education level: Not on file  Social Needs  . Financial resource strain: Not on file  . Food insecurity - worry: Not on file  . Food insecurity - inability: Not on file  . Transportation needs - medical: Not on file  . Transportation needs - non-medical: Not on file  Occupational History  . Not on file  Tobacco Use  . Smoking status: Never Smoker  . Smokeless tobacco: Never Used  Substance and Sexual Activity  . Alcohol use: No  . Drug use: No  . Sexual activity: Not Currently  Other Topics Concern  . Not on file  Social History Narrative   Lives independently    Mobility: Rolling walker and/or cane Work history: Not obtained   No Known Allergies  Family History  Problem Relation Age of Onset  . Diabetes Mellitus II Son      Prior to Admission medications   Medication Sig Start Date End Date Taking? Authorizing Provider  acetaminophen (TYLENOL)  500 MG tablet Take 500 mg by mouth at bedtime as needed for mild pain.    Yes [provider]  apixaban (ELIQUIS) 2.5 MG TABS tablet Take 1 tablet (2.5 mg total) by mouth 2 (two) times daily. 11/30/16  Yes Lyn Records, MD  aspirin EC 81 MG tablet Take 1 tablet (81 mg total) by mouth every Monday, Wednesday, and Friday. 08/14/17  Yes Lyn Records, MD  atorvastatin (LIPITOR) 20 MG tablet Take 20 mg by mouth daily.   Yes [provider]  carvedilol (COREG) 6.25 MG tablet TAKE 1 TABLET (6.25 MG TOTAL) BY MOUTH 2 (TWO) TIMES DAILY WITH A MEAL. 08/19/17  Yes Janetta Hora, PA-C  furosemide (LASIX) 40 MG tablet Take 1 tablet (40 mg total) by mouth daily. 09/26/16  Yes Tyrone Nine, MD  levothyroxine (SYNTHROID, LEVOTHROID) 88 MCG tablet Take 88 mcg by mouth See admin instructions. Monday-Friday only   Yes [provider]  loteprednol (ALREX) 0.2 % SUSP Place 1 drop into both eyes 2 (two) times daily.   Yes [provider]  multivitamin-lutein (OCUVITE-LUTEIN) CAPS capsule Take 1 capsule by mouth daily.   Yes [provider]  potassium chloride 20 MEQ TBCR Take 20 mEq by mouth daily. 09/25/16  Yes Tyrone Nine, MD  levofloxacin (LEVAQUIN) 250 MG tablet Take 1 tablet (250 mg total) by mouth daily for 7 days. 10/30/17 11/06/17  Howard Pouch, MD  nitroGLYCERIN (NITROSTAT) 0.4 MG SL tablet Place 0.4 mg under the tongue every 5 (five) minutes as needed for chest pain.  07/29/16   [provider]    Physical Exam: Vitals:   10/30/17 1145 10/30/17 1230 10/30/17 1245 10/30/17 1330  BP: (!) 141/49 (!) 121/58 (!) 124/51 106/71  Pulse: 66 64 66 69  Resp: 18 (!) 22 (!) 26 (!) 21  Temp:      TempSrc:      SpO2: 92% 90% 90% 93%  Weight:          Constitutional: NAD, calm, comfortable Eyes: PERRL, lids and conjunctivae normal ENMT: Mucous membranes are moist. Posterior pharynx clear of any exudate or lesions.Normal dentition.  Neck: normal, supple,  no masses, no thyromegaly Respiratory: Focal right basilar crackles without wheezing or rhonchi. Normal respiratory effort without accessory muscle use.  Room air at rest.  Previously documented saturations decreasing to 88% with ambulation. Cardiovascular: Regular rate and rhythm, no murmurs / rubs / gallops. No extremity edema. 2+ pedal pulses. No carotid bruits.  Abdomen: no tenderness, no masses palpated. No hepatosplenomegaly. Bowel sounds positive.  Musculoskeletal: no clubbing / cyanosis. No joint deformity upper and lower extremities. Good ROM, no contractures. Normal muscle tone.  Skin: no rashes, lesions, ulcers. No induration Neurologic: CN 2-12 grossly intact. Sensation intact, DTR normal. Strength 5/5 x all 4 extremities.  Psychiatric: Normal judgment and insight. Alert and oriented x 3. Normal mood.    Labs on Admission: I have personally reviewed following labs and imaging studies  CBC: Recent Labs  Lab 10/30/17 0751  WBC 8.4  HGB 11.4*  HCT 37.4  MCV 98.4  PLT 145*   Basic Metabolic Panel: Recent Labs  Lab 10/30/17 0751  NA 140  K 4.5  CL 105  CO2 28  GLUCOSE 149*  BUN 16  CREATININE 1.25*  CALCIUM 9.0   GFR: Estimated Creatinine Clearance: 25.3 mL/min (A) (by C-G formula based on SCr of 1.25 mg/dL (H)). Liver Function Tests: No results for input(s): AST, ALT, ALKPHOS, BILITOT, PROT, ALBUMIN in the last 168 hours. No results for input(s): LIPASE, AMYLASE in the last 168 hours. No results for input(s): AMMONIA in the last 168 hours. Coagulation Profile: No results for input(s): INR, PROTIME in the last 168 hours. Cardiac Enzymes: Recent Labs  Lab 10/30/17 1234  TROPONINI 0.18*   BNP (last 3 results) No results for input(s): PROBNP in the last 8760 hours. HbA1C: No results for input(s): HGBA1C in the last 72 hours. CBG: No results for input(s): GLUCAP in the last 168 hours. Lipid Profile: No results for input(s): CHOL, HDL, LDLCALC, TRIG, CHOLHDL,  LDLDIRECT in the last 72 hours. Thyroid Function Tests: No results for input(s): TSH, T4TOTAL, FREET4, T3FREE, THYROIDAB in the last 72 hours. Anemia Panel: No results for input(s): VITAMINB12, FOLATE, FERRITIN, TIBC, IRON, RETICCTPCT in the last 72 hours. Urine analysis:    Component Value Date/Time   COLORURINE YELLOW 10/30/2017 1302   APPEARANCEUR CLEAR 10/30/2017 1302   LABSPEC <  1.005 (L) 10/30/2017 1302   PHURINE 6.0 10/30/2017 1302   GLUCOSEU NEGATIVE 10/30/2017 1302   HGBUR MODERATE (A) 10/30/2017 1302   BILIRUBINUR NEGATIVE 10/30/2017 1302   KETONESUR NEGATIVE 10/30/2017 1302   PROTEINUR NEGATIVE 10/30/2017 1302   UROBILINOGEN 1.0 11/18/2008 1705   NITRITE NEGATIVE 10/30/2017 1302   LEUKOCYTESUR NEGATIVE 10/30/2017 1302   Sepsis Labs: @LABRCNTIP (procalcitonin:4,lacticidven:4) )No results found for this or any previous visit (from the past 240 hour(s)).   Radiological Exams on Admission: Dg Chest 2 View  Result Date: 10/30/2017 CLINICAL DATA:  Short of breath chest pain EXAM: CHEST  2 VIEW COMPARISON:  09/23/2016 FINDINGS: Pulmonary hyperinflation with COPD and scarring. Improvement in mild bibasilar airspace disease likely atelectasis. Minimal pleural effusion. Negative for edema. Apical scarring bilaterally. Coronary artery stents. IMPRESSION: COPD with improvement in bibasilar airspace disease. Small pleural effusions. Electronically Signed   By: Marlan Palau M.D.   On: 10/30/2017 08:52    EKG: (Independently reviewed) sinus rhythm with ventricular rate 79 bpm, QTC 446 ms, normal R wave rotation, nonspecific ST changes in V5 with early repolarization and V6 otherwise a nonischemic and otherwise normal-appearing EKG.  Assessment/Plan Principal Problem:   Chest pain/known CAD (coronary artery disease)-DES/LAD 2017 -Presents to ER after having episode of chest pain at home resolved after aspirin but troponin slightly elevated at 0.18 concerning for potential ischemic  etiology -EKG reassuring -We will continue to cycle troponin-if no further recurrences of chest pain and troponin trend remains flat no indication to consult cardiology **2nd TNI up to 0.25 so will consult Cards -Suspect elevated troponin may be solely related to demand ischemia in the context of acute heart failure and recent mild hypoxemia -Obtain echocardiogram-if EF has decreased may require cardiology consultation noting that prior to treatment of underlying CAD patient had decreased EF in context of ischemic cardiomyopathy -Last cardiac catheterization was October 2017 at which time patient had DES placed to LAD -Blood pressure somewhat suboptimal so holding carvedilol -On Eliquis so we will continue low-dose aspirin 81 mg daily -Continue statin -Chest pain-free -Continue oxygen -NTG prn  Active Problems:   Hypoxia -Documented with exertional hypoxemia O2 saturations 88% -Continue nasal cannula oxygen -No evidence of focal infiltrate on chest x-ray and documented minimal bilateral pleural effusions -Treat underlying causes -Given new fever I am concerned she may become worse from a respiratory standpoint before she improves    Acute on chronic systolic heart failure/known Ischemic cardiomyopathy -Patient presents with episode of chest pain that resolved prior to arrival; reports 2 weeks of dyspnea on exertion with trace lower extremity edema and small bilateral effusions on chest x-ray-also with mildly elevated BNP 661 and elevated troponin 0 0.18 -Has responded with increased urine output after Lasix 20 mg IV x1 in ER-given suboptimal BP and patient currently stable from a respiratory standpoint rest will not give additional Lasix until tomorrow a.m. and will give 40 mg IV has low GFR -Daily weights, strict I/O -Last echocardiogram December 2017: EF 40-45% improved from EF of 25-35% in October, mild pulmonary hypertension 35 mmHg -Repeat echocardiogram this admission -No ACE  inhibitor/ARB secondary to chronic kidney disease -Hold beta-blocker in the context of suboptimal blood pressure readings    Fever -Urinalysis unremarkable -Obtain respiratory viral panel -Follow vital signs -Tylenol prn    Permanent atrial fibrillation  -Currently maintaining sinus rhythm -Beta-blocker on hold as above -Continue Eliquis (dose adjusted for low GFR) -CHADS2 Score for Stroke Risk in Atrial Fibrillation History of CHF: Yes (1 point) History  of hypertension: Yes (1 point) Age greater than or equal to 75 years: Yes (1 point) History of diabetes mellitus: No (0 points) Previous stroke symptoms or TIA: Yes (2 points) Score: 5. Thromboembolic event high risk. Risk of event is 12.5% per year if no coumadin. Note: While history of stroke provides 2 points, most physicians would move these patients directly to the high risk group (>8.5% risk of event per year if no coumadin).    CKD (chronic kidney disease) stage 3, GFR 30-59 ml/min  -Renal function stable and at baseline    Hypertension -Current blood pressure somewhat suboptimal so holding preadmission carvedilol    Hypothyroidism, adult -Continue Synthroid    DVT prophylaxis: Eliquis Code Status: DNR Family Communication: Family at bedside Disposition Plan: Home Consults called: Cardiology/[facility] on-call (primary OP is Katrinka BlazingSmith)    Chatara Lucente L. ANP-BC [facility] Pager [phone]   If 7PM-7AM, please contact night-coverage [email or web address] Password [email or web address]  10/30/2017, 3:34 PM

## 2017-10-30 NOTE — ED Provider Notes (Signed)
[facility] EMERGENCY DEPARTMENT Provider Note   CSN: [id number] Arrival date & time: 10/30/17  0734     History   Chief Complaint Chief Complaint  Patient presents with  . Chest Pain  . Shortness of Breath    HPI Chelsea Fuller is a 84 y.o. female with a history of chronic afib on eliquis and rate controlled on diltiazem, HFrEF with EF of 50%, hypothyroidism, CKD III and CAD with history of STEMI, presenting to the ED via EMS with shortness of breath.  Patient was in her usual state of health until she woke this morning and noticed dyspnea worse than baseline and dull, non-radiating central chest pain consistent with previous STEMI symptoms when she got up to use the bathroom. She took ASA 325 mg and chest pain improved, dyspnea persisted prompting her to call EMS. She was placed on 4L by [place] for comfort with O2 saturations improving from 93% to 100% with air hunger improving at rest but still not at baseline.  Patient usually takes medications in the morning and has not taken her daily lasix 40 mg yet this morning. At baseline she lives at home by herself and independently manages her own medications. Recent med changes include discontinuation of Plavix in 07/2017. ASA 81 and Eliquis were continued. No recent infections, no fevers, +rhinorrhea but no cough, no N/V/D/C, no urinary symptoms.   Past Medical History:  Diagnosis Date  . CAD (coronary artery disease)    a. 07/2016: anterior STEMI s/p DES x2 to LAD  . CKD (chronic kidney disease), stage III (HCC)   . CVA (cerebral infarction) 2010  . Fall    a. 2014: nasal bone fx.  . Hypertension   . Hypothyroidism   . Ischemic cardiomyopathy    a. EF 25% by cath 08/19/16 then echo showed EF 50-55% 08/20/16.  . Mitral regurgitation    a. mild by echo 07/2016.  . Mitral stenosis    a. mild by echo 07/2016.  Marland Kitchen. Permanent atrial fibrillation (HCC)    a. not presently on anticoagulation due to STEMI 07/2016 and  risk of triple therapy with DAPT + anticoag.  . Tricuspid regurgitation    a. mod by echo 07/2016.    Patient Active Problem List   Diagnosis Date Noted  . Chest pain 10/30/2017  . Ischemic cardiomyopathy 10/30/2017  . CAD (coronary artery disease)-DES/LAD 2017 10/30/2017  . Hypertension 10/30/2017  . Hypoxia 10/30/2017  . Acute on chronic diastolic heart failure (HCC) 10/30/2017  . Hypothyroidism, adult 10/30/2017  . Permanent atrial fibrillation (HCC) 10/30/2017  . CKD (chronic kidney disease) stage 3, GFR 30-59 ml/min (HCC) 10/30/2017  . Fever 10/30/2017  . CKD (chronic kidney disease), stage III (HCC) 09/24/2016  . Accelerated hypertension 09/24/2016  . CAD in native artery 09/24/2016  . Chronic diastolic heart failure (HCC) 09/23/2016  . 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 Chronic atrial fibrillation (HCC)   . Epistaxis 10/04/2013  . Anemia 10/04/2013  . Nasal fracture 10/04/2013  . History of CVA (cerebrovascular accident) 10/04/2013  . HTN (hypertension) 10/04/2013  . Hypothyroidism 10/04/2013    Past Surgical History:  Procedure Laterality Date  . CARDIAC CATHETERIZATION N/A 08/19/2016   Procedure: Left Heart Cath and Coronary Angiography;  Surgeon: Lyn RecordsHenry W Smith, MD;  LM 30%, pLAD 50%, dLAD 100% s/p  Synergy 2.5 x 24 mm DES, RCA 20%  . CARDIAC CATHETERIZATION N/A 08/19/2016   Procedure: Coronary Stent Intervention;  Surgeon: Lyn RecordsHenry W Smith, MD;  SYNERGY DES 2.5X24 drug eluting stent  . CHOLECYSTECTOMY      OB History    No data available       Home Medications    Prior to Admission medications   Medication Sig Start Date End Date Taking? Authorizing Provider  acetaminophen (TYLENOL) 500 MG tablet Take 500 mg by mouth at bedtime as needed for mild pain.    Yes [provider]  apixaban (ELIQUIS) 2.5 MG TABS tablet Take 1 tablet (2.5 mg total) by mouth 2 (two) times daily. 11/30/16  Yes Lyn Records, MD  aspirin EC 81 MG tablet Take 1 tablet (81 mg  total) by mouth every Monday, Wednesday, and Friday. 08/14/17  Yes Lyn Records, MD  atorvastatin (LIPITOR) 20 MG tablet Take 20 mg by mouth daily.   Yes [provider]  carvedilol (COREG) 6.25 MG tablet TAKE 1 TABLET (6.25 MG TOTAL) BY MOUTH 2 (TWO) TIMES DAILY WITH A MEAL. 08/19/17  Yes Janetta Hora, PA-C  furosemide (LASIX) 40 MG tablet Take 1 tablet (40 mg total) by mouth daily. 09/26/16  Yes Tyrone Nine, MD  levothyroxine (SYNTHROID, LEVOTHROID) 88 MCG tablet Take 88 mcg by mouth See admin instructions. Monday-Friday only   Yes [provider]  loteprednol (ALREX) 0.2 % SUSP Place 1 drop into both eyes 2 (two) times daily.   Yes [provider]  multivitamin-lutein (OCUVITE-LUTEIN) CAPS capsule Take 1 capsule by mouth daily.   Yes [provider]  potassium chloride 20 MEQ TBCR Take 20 mEq by mouth daily. 09/25/16  Yes Tyrone Nine, MD  levofloxacin (LEVAQUIN) 250 MG tablet Take 1 tablet (250 mg total) by mouth daily for 7 days. 10/30/17 11/06/17  Howard Pouch, MD  nitroGLYCERIN (NITROSTAT) 0.4 MG SL tablet Place 0.4 mg under the tongue every 5 (five) minutes as needed for chest pain.  07/29/16   [provider]    Family History Family History  Problem Relation Age of Onset  . Diabetes Mellitus II Son     Social History Social History   Tobacco Use  . Smoking status: Never Smoker  . Smokeless tobacco: Never Used  Substance Use Topics  . Alcohol use: No  . Drug use: No     Allergies   Patient has no known allergies.   Review of Systems Review of Systems  Constitutional: Negative for activity change, chills, diaphoresis and fever.  HENT: Positive for rhinorrhea. Negative for congestion and sore throat.   Respiratory: Positive for shortness of breath. Negative for cough and wheezing.   Cardiovascular: Negative for chest pain and palpitations.  Gastrointestinal: Negative for abdominal distention, constipation, diarrhea,  nausea and vomiting.  Genitourinary: Negative for dysuria and frequency.    Physical Exam Updated Vital Signs BP (!) 141/49   Pulse 66   Temp 99.1 F (37.3 C) (Oral)   Resp 18   Wt 64 kg (141 lb)   SpO2 92%   BMI 24.20 kg/m   Physical Exam  Constitutional: She is oriented to person, place, and time. She appears well-developed and well-nourished.  HENT:  Head: Normocephalic and atraumatic.  Eyes: EOM are normal.  Neck: JVD present.  Cardiovascular: Intact distal pulses. An irregularly irregular rhythm present.  No murmur heard. Trace to 1+ edema to knee LE bilaterally  Pulmonary/Chest: Effort normal. She has no wheezes. She has no rhonchi. She has rales in the right upper field, the right middle field, the right lower field, the left upper field, the  left middle field and the left lower field.  Abdominal: Soft. Bowel sounds are normal. She exhibits no distension. There is no splenomegaly or hepatomegaly. There is no tenderness.  Neurological: She is alert and oriented to person, place, and time.  Skin: Skin is warm and dry.  Psychiatric: She has a normal mood and affect. Judgment and thought content normal.     ED Treatments / Results  Labs (all labs ordered are listed, but only abnormal results are displayed) Labs Reviewed  BASIC METABOLIC PANEL - Abnormal; Notable for the following components:      Result Value   Glucose, Bld 149 (*)    Creatinine, Ser 1.25 (*)    GFR calc non Af Amer 36 (*)    GFR calc Af Amer 42 (*)    All other components within normal limits  CBC - Abnormal; Notable for the following components:   RBC 3.80 (*)    Hemoglobin 11.4 (*)    Platelets 145 (*)    All other components within normal limits  TROPONIN I - Abnormal; Notable for the following components:   Troponin I 0.18 (*)    All other components within normal limits  BRAIN NATRIURETIC PEPTIDE - Abnormal; Notable for the following components:   B Natriuretic Peptide 661.1 (*)    All  other components within normal limits  URINE CULTURE  CULTURE, BLOOD (ROUTINE X 2)  CULTURE, BLOOD (ROUTINE X 2)  RESPIRATORY PANEL BY PCR  URINALYSIS, DIPSTICK ONLY  TROPONIN I  TROPONIN I  TROPONIN I  I-STAT TROPONIN, ED  I-STAT TROPONIN, ED    EKG  EKG Interpretation  Date/Time:  Wednesday October 30 2017 07:41:43 EST Ventricular Rate:  79 PR Interval:    QRS Duration: 99 QT Interval:  389 QTC Calculation: 446 R Axis:   -16 Text Interpretation:  Sinus rhythm Borderline left axis deviation Anteroseptal infarct, age indeterminate Confirmed by Rolland Porter ([id number]) on 10/30/2017 8:19:03 AM       Radiology Dg Chest 2 View  Result Date: 10/30/2017 CLINICAL DATA:  Short of breath chest pain EXAM: CHEST  2 VIEW COMPARISON:  09/23/2016 FINDINGS: Pulmonary hyperinflation with COPD and scarring. Improvement in mild bibasilar airspace disease likely atelectasis. Minimal pleural effusion. Negative for edema. Apical scarring bilaterally. Coronary artery stents. IMPRESSION: COPD with improvement in bibasilar airspace disease. Small pleural effusions. Electronically Signed   By: Marlan Palau M.D.   On: 10/30/2017 08:52    Procedures Procedures (including critical care time)  Medications Ordered in ED Medications  nitroGLYCERIN (NITROSTAT) SL tablet 0.4 mg (not administered)  apixaban (ELIQUIS) tablet 2.5 mg (not administered)  aspirin EC tablet 81 mg (not administered)  atorvastatin (LIPITOR) tablet 20 mg (not administered)  levothyroxine (SYNTHROID, LEVOTHROID) tablet 88 mcg (not administered)  furosemide (LASIX) injection 40 mg (not administered)  furosemide (LASIX) injection 20 mg (20 mg Intravenous Given 10/30/17 0825)    Initial Impression / Assessment and Plan / ED Course  I have reviewed the triage vital signs and the nursing notes.  Pertinent labs & imaging results that were available during my care of the patient were reviewed by me and considered in my medical decision  making (see chart for details).   History and physical exam consistent with acute CHF exacerbation vs pneumonia. CXR consistent with mild hypervolemia vs PNA. Patient cautiously diuresed due to age with 20 mg IV Lasix with good urine output and improvement in symptoms. iStat troponin noted to be 0.05. Temperature bumped to 100.53F on  recheck. UA and UCx ordered.   Patient was ambulated on room air with O2 saturations maintained above 88%. Considered discharging with outpatient course of antibiotics for CAP, however second troponin came back elevated at 0.18.   Most likely CHF exacerbation, supported by BNP elevated at 661, though with confounding factor of fever. Will call for inpatient admission for serial troponin, monitoring overnight and echocardiogram. Admitting team prefers no antibiotics, so we will hold off on empiric pneumonia treatment.  Final Clinical Impressions(s) / ED Diagnoses   Final diagnoses:  Shortness of breath  Community acquired pneumonia, unspecified laterality    Howard Pouch, MD 10/30/17 1327    Howard Pouch, MD 10/30/17 1517    Rolland Porter, MD 11/01/17 0800

## 2017-10-30 NOTE — ED Notes (Signed)
Pt placed on droplet precautions

## 2017-10-30 NOTE — Progress Notes (Signed)
  Pt admitted to the unit. Pt is stable, alert and oriented per baseline. Oriented to room, staff, and call bell. Educated to call for any assistance. Bed in lowest position, call bell within reach- will continue to monitor. 

## 2017-10-30 NOTE — ED Notes (Signed)
Patient transported to X-ray 

## 2017-10-30 NOTE — ED Notes (Signed)
Ambulated Pt in hallway per MD request. Pt ambulated with walker per baseline. Pt denied SOB, pain, or dizziness. Pt baseline O2 sat. @ 93%. Desat to 88% while ambulating. RN notified.

## 2017-10-30 NOTE — Discharge Instructions (Addendum)
You were seen and evaluated in the Emergency Department for shortness of breath. You were given a dose of lasix and your shortness of breath improved.  You were noted to have a fever in the emergency department and so you were sent with antibiotics to cover for pneumonia. Please complete this course.  Please schedule follow up to be seen by your regular doctor in the next 2 days. If you develop worsening shortness of breath or chest pain, these would be reasons to come back into the hospital to be evaluated.

## 2017-10-31 ENCOUNTER — Inpatient Hospital Stay (HOSPITAL_COMMUNITY): Payer: Medicare Other

## 2017-10-31 DIAGNOSIS — I10 Essential (primary) hypertension: Secondary | ICD-10-CM

## 2017-10-31 DIAGNOSIS — I361 Nonrheumatic tricuspid (valve) insufficiency: Secondary | ICD-10-CM

## 2017-10-31 DIAGNOSIS — R079 Chest pain, unspecified: Secondary | ICD-10-CM

## 2017-10-31 LAB — GLUCOSE, CAPILLARY: Glucose-Capillary: 144 mg/dL — ABNORMAL HIGH (ref 65–99)

## 2017-10-31 LAB — RESPIRATORY PANEL BY PCR
Adenovirus: NOT DETECTED
Bordetella pertussis: NOT DETECTED
CORONAVIRUS OC43-RVPPCR: NOT DETECTED
Chlamydophila pneumoniae: NOT DETECTED
Coronavirus 229E: NOT DETECTED
Coronavirus HKU1: NOT DETECTED
Coronavirus NL63: NOT DETECTED
INFLUENZA A-RVPPCR: NOT DETECTED
Influenza B: NOT DETECTED
METAPNEUMOVIRUS-RVPPCR: NOT DETECTED
Mycoplasma pneumoniae: NOT DETECTED
PARAINFLUENZA VIRUS 1-RVPPCR: NOT DETECTED
PARAINFLUENZA VIRUS 2-RVPPCR: NOT DETECTED
PARAINFLUENZA VIRUS 3-RVPPCR: NOT DETECTED
PARAINFLUENZA VIRUS 4-RVPPCR: NOT DETECTED
RESPIRATORY SYNCYTIAL VIRUS-RVPPCR: NOT DETECTED
RHINOVIRUS / ENTEROVIRUS - RVPPCR: NOT DETECTED

## 2017-10-31 LAB — BASIC METABOLIC PANEL
ANION GAP: 10 (ref 5–15)
BUN: 16 mg/dL (ref 6–20)
CALCIUM: 8.6 mg/dL — AB (ref 8.9–10.3)
CO2: 27 mmol/L (ref 22–32)
CREATININE: 1.19 mg/dL — AB (ref 0.44–1.00)
Chloride: 101 mmol/L (ref 101–111)
GFR calc Af Amer: 45 mL/min — ABNORMAL LOW (ref >60)
GFR, EST NON AFRICAN AMERICAN: 39 mL/min — AB (ref >60)
Glucose, Bld: 96 mg/dL (ref 65–99)
Potassium: 3.6 mmol/L (ref 3.5–5.1)
SODIUM: 138 mmol/L (ref 135–145)

## 2017-10-31 LAB — INFLUENZA PANEL BY PCR (TYPE A & B)
INFLAPCR: NEGATIVE
Influenza B By PCR: NEGATIVE

## 2017-10-31 LAB — ECHOCARDIOGRAM COMPLETE
HEIGHTINCHES: 64 in
WEIGHTICAEL: 2179.2 [oz_av]

## 2017-10-31 LAB — TROPONIN I: Troponin I: 0.13 ng/mL (ref <0.03)

## 2017-10-31 LAB — URINE CULTURE

## 2017-10-31 MED ORDER — FUROSEMIDE 40 MG PO TABS
40.0000 mg | ORAL_TABLET | Freq: Every day | ORAL | Status: DC
  Administered 2017-11-01: 40 mg via ORAL
  Filled 2017-10-31 (×2): qty 1

## 2017-10-31 MED ORDER — FUROSEMIDE 40 MG PO TABS: 40.0000 mg | ORAL_TABLET | ORAL | Status: DC

## 2017-10-31 NOTE — Progress Notes (Signed)
  Echocardiogram 2D Echocardiogram has been performed.  Chelsea Fuller 10/31/2017, 3:34 PM

## 2017-10-31 NOTE — Progress Notes (Signed)
Progress Note  Patient Name: Chelsea Fuller Date of Encounter: 10/31/2017  Primary Cardiologist: Lesleigh Noe, MD   Subjective   Pt states she is feeling much better today. Her CP has resolved. No dyspnea. Still with nonproductive cough. She states she feels much stronger today.   Inpatient Medications    Scheduled Meds: . apixaban  2.5 mg Oral BID  . aspirin EC  81 mg Oral Q M,W,F  . atorvastatin  20 mg Oral Daily  . furosemide  40 mg Intravenous Daily  . levothyroxine  88 mcg Oral Once per day on Mon Tue Wed Thu Fri   Continuous Infusions:  PRN Meds: acetaminophen, nitroGLYCERIN, ondansetron (ZOFRAN) IV   Vital Signs    Vitals:   10/30/17 2137 10/30/17 2324 10/31/17 0528 10/31/17 0743  BP: (!) 143/55 (!) 120/47 (!) 133/46 (!) 144/49  Pulse: 63 65 64 61  Resp: (!) 23 15 16 18   Temp:  98.4 F (36.9 C) 98 F (36.7 C) 98.1 F (36.7 C)  TempSrc:  Oral Oral Oral  SpO2: 99% 98% 96% 100%  Weight:  136 lb 14.4 oz (62.1 kg) 136 lb 3.2 oz (61.8 kg)   Height:  5\' 4"  (1.626 m)      Intake/Output Summary (Last 24 hours) at 10/31/2017 1223 Last data filed at 10/31/2017 0530 Gross per 24 hour  Intake -  Output 65 ml  Net -65 ml   Filed Weights   10/30/17 0747 10/30/17 2324 10/31/17 0528  Weight: 141 lb (64 kg) 136 lb 14.4 oz (62.1 kg) 136 lb 3.2 oz (61.8 kg)    Telemetry    NSr - Personally Reviewed  ECG    NSR - Personally Reviewed  Physical Exam   GEN: No acute distress. Elderly Neck: No JVD Cardiac: RRR, no murmurs, rubs, or gallops.  Respiratory: Clear to auscultation bilaterally. GI: Soft, nontender, non-distended  MS: No edema; No deformity. Neuro:  Nonfocal  Psych: Normal affect   Labs    Chemistry Recent Labs  Lab 10/30/17 0751 10/31/17 0550  NA 140 138  K 4.5 3.6  CL 105 101  CO2 28 27  GLUCOSE 149* 96  BUN 16 16  CREATININE 1.25* 1.19*  CALCIUM 9.0 8.6*  GFRNONAA 36* 39*  GFRAA 42* 45*  ANIONGAP 7 10      Hematology Recent Labs  Lab 10/30/17 0751  WBC 8.4  RBC 3.80*  HGB 11.4*  HCT 37.4  MCV 98.4  MCH 30.0  MCHC 30.5  RDW 13.4  PLT 145*    Cardiac Enzymes Recent Labs  Lab 10/30/17 1234 10/30/17 1530 10/30/17 2043 10/31/17 0550  TROPONINI 0.18* 0.25* 0.24* 0.13*    Recent Labs  Lab 10/30/17 0803  TROPIPOC 0.05     BNP Recent Labs  Lab 10/30/17 1254  BNP 661.1*     DDimer No results for input(s): DDIMER in the last 168 hours.   Radiology    Dg Chest 2 View  Result Date: 10/30/2017 CLINICAL DATA:  Short of breath chest pain EXAM: CHEST  2 VIEW COMPARISON:  09/23/2016 FINDINGS: Pulmonary hyperinflation with COPD and scarring. Improvement in mild bibasilar airspace disease likely atelectasis. Minimal pleural effusion. Negative for edema. Apical scarring bilaterally. Coronary artery stents. IMPRESSION: COPD with improvement in bibasilar airspace disease. Small pleural effusions. Electronically Signed   By: Marlan Palau M.D.   On: 10/30/2017 08:52    Cardiac Studies   Echo 09/25/16: Study Conclusions - Left ventricle: The cavity size  was normal. Wall thickness was normal. Systolic function was mildly to moderately reduced. The estimated ejection fraction was in the range of 40% to 45%. Severe hypokinesis of the mid-apicalanteroseptal, anterior, and apical myocardium; consistent with infarction in the distribution of the left anterior descending coronary artery. No evidence of thrombus. - Aortic valve: There was mild regurgitation. - Mitral valve: Calcified annulus. There was mild regurgitation. - Left atrium: The atrium was mildly dilated. - Pulmonary arteries: Systolic pressure was mildly increased. PA peak pressure: 35 mm Hg (S).   Left heart cath 08/19/16:  Prox RCA to Dist RCA lesion, 20 %stenosed.  Prox LAD lesion, 50 %stenosed.  LM lesion, 30 %stenosed.  A STENT SYNERGY DES 2.25X32 drug eluting stent was successfully  placed.  Dist LAD lesion, 100 %stenosed.  Post intervention, there is a 0% residual stenosis.  A STENT SYNERGY DES 2.5X24 drug eluting stent was successfully placed, and does not overlap previously placed stent.  Mid LAD lesion, 80 %stenosed.  Post intervention, there is a 0% residual stenosis.  There is moderate to severe left ventricular systolic dysfunction.  The left ventricular ejection fraction is 25-35% by visual estimate.  LV end diastolic pressure is moderately elevated.   Acute anterior ST elevation myocardial infarction.  Successful PCI with stenting of the mid LAD from 100% to 0% with a Synergy 2.25 x 32 drug-eluting stent with TIMI grade 3 flow.  Successful primary stenting of a segmental 80% proximal LAD stenosis to 0% using a 2.5 x 24 Synergy drug-eluting stent.  Irregularities noted in the circumflex and right coronary.  Significant anteroapical wall motion abnormality/akinesis with estimated EF 25-35% with LVEDP 24 mmHg.   Patient Profile     Chelsea Fuller is a 84 y.o. female with a hx of HTN, chronic systolic and diastolic heart failure, hypothyroidism, chronic Afib on eliquis, CKD stage 3, and stroke (2010), CAD with STEMI s/p DES x 2 to LAD (2017), now on plavix, no ASA, initial EF of 25% now improved to 40-45% who is being seen today for the evaluation of chest pain and SOB at the request of Dr. Ophelia CharterYates.  Assessment & Plan    1. Chest Pain: resolved. Improved with Lasix. Troponin's mildly elevated but flat trend. Suspect demand ischemia from acute CHF and not ACS. No plans for additional ischemic w/u at this time.  2. Acute on Chronic Systolic and Diastolic HF: Pt feels better after dose of Lasix. Strict I/Os not recorded, however foley canister is over half full. No dyspnea currently. No LEE and lungs are CTAB. Continue PO Lasix.   3. Elevated Troponin: mildly elevated, 0.18>>0.25>.0.24>>0.13. Suspect demand ischemia in the setting of acute on chronic  systolic + diastolic HF.  4. CAD: h/o STEMI in 2017 s/p DES x 2 to LAD. Troponin's are mildly elevated and flat. 2D echo pending. Currently CP free. Seen by Dr. Excell Seltzerooper yesterday, who felt that continuation of medial therapy is most appropriate considering her advanced age. Continue ASA and statin.   5. Chronic Atrial Fibrillation: NSR on tele. Continue home coreg for rate control and Eliquis for a/c.   6. CKD, stage III:  SCr today is 1.19. Baseline is 1.13-1.22  7. Cough/ URI: negative for flu. Respiratory panel also negative.   For questions or updates, please contact [facility] Please consult [email or web address] for contact info under Cardiology/STEMI.      Signed, Robbie LisBrittainy Kaprice Kage, PA-C  10/31/2017, 12:23 PM

## 2017-10-31 NOTE — Progress Notes (Addendum)
PROGRESS NOTE    Chelsea Fuller  ZOX:[id number] DOB: 06/25/1924 DOA: 10/30/2017 PCP: Nila NephewGreen, Edwin, MD   Brief Narrative: 84 y.o. female with a Past Medical History of Afib on Eliquis; hypothyroidism; HTN; CVA; CAD s/p PCI; and CKD presented with chest pain and shortness of breath.  Patient with elevated troponin and likely acute CHF exacerbation.  Treated with IV Lasix.  Assessment & Plan:   #Chest pain/shortness of breath/hypoxia likely in the setting of mild acute on chronic systolic CHF/ischemic cardiomyopathy: -Patient reported that she is feeling better.  No chest pain. -Continue IV Lasix once daily.  Caution with Lasix because of reduced GFR and elderly patient. -Follow-up echocardiogram -Cardiology consult appreciated -PT OT evaluation. -Was on 2 L of oxygen now weaning.  #Chronic atrial fibrillation: Monitor heart rate.  On Eliquis.  Monitor heart rate  #Chronic kidney disease stage III creatinine is stable.  #Hypertension: Monitor blood pressure.  Continue current medication.  #Hypothyroidism: On Synthroid.  #Subjective fever on admission: UA negative.  Influenza RSV negative.  Chest x-ray with no pneumonia.  Patient is afebrile in the hospital.  Continue supportive care.  DVT prophylaxis: Eliquis Code Status: DNR Family Communication: No family at bedside Disposition Plan: PT and OT evaluation.  Likely discharge home in 1 or 2 days    Consultants:   Cardiology  Procedures: None Antimicrobials: None  Subjective: Seen and examined at bedside.  Reported that her shortness of breath is better.  Has weakness.  No cough.  No chest pain.  No nausea or vomiting.  Objective: Vitals:   10/30/17 2137 10/30/17 2324 10/31/17 0528 10/31/17 0743  BP: (!) 143/55 (!) 120/47 (!) 133/46 (!) 144/49  Pulse: 63 65 64 61  Resp: (!) 23 15 16 18   Temp:  98.4 F (36.9 C) 98 F (36.7 C) 98.1 F (36.7 C)  TempSrc:  Oral Oral Oral  SpO2: 99% 98% 96% 100%  Weight:  62.1 kg  (136 lb 14.4 oz) 61.8 kg (136 lb 3.2 oz)   Height:  5\' 4"  (1.626 m)      Intake/Output Summary (Last 24 hours) at 10/31/2017 1252 Last data filed at 10/31/2017 0530 Gross per 24 hour  Intake -  Output 65 ml  Net -65 ml   Filed Weights   10/30/17 0747 10/30/17 2324 10/31/17 0528  Weight: 64 kg (141 lb) 62.1 kg (136 lb 14.4 oz) 61.8 kg (136 lb 3.2 oz)    Examination:  General exam: Appears calm and comfortable  Respiratory system: Bibasilar decreased breath sound, respiratory for normal.  Cardiovascular system: S1 & S2 heard, RRR.  No pedal edema. Gastrointestinal system: Abdomen is nondistended, soft and nontender. Normal bowel sounds heard. Central nervous system: Alert and oriented. No focal neurological deficits. Extremities: Symmetric 5 x 5 power. Skin: No rashes, lesions or ulcers Psychiatry: Judgement and insight appear normal. Mood & affect appropriate.     Data Reviewed: I have personally reviewed following labs and imaging studies  CBC: Recent Labs  Lab 10/30/17 0751  WBC 8.4  HGB 11.4*  HCT 37.4  MCV 98.4  PLT 145*   Basic Metabolic Panel: Recent Labs  Lab 10/30/17 0751 10/31/17 0550  NA 140 138  K 4.5 3.6  CL 105 101  CO2 28 27  GLUCOSE 149* 96  BUN 16 16  CREATININE 1.25* 1.19*  CALCIUM 9.0 8.6*   GFR: Estimated Creatinine Clearance: 26.6 mL/min (A) (by C-G formula based on SCr of 1.19 mg/dL (H)). Liver Function Tests: No results  for input(s): AST, ALT, ALKPHOS, BILITOT, PROT, ALBUMIN in the last 168 hours. No results for input(s): LIPASE, AMYLASE in the last 168 hours. No results for input(s): AMMONIA in the last 168 hours. Coagulation Profile: No results for input(s): INR, PROTIME in the last 168 hours. Cardiac Enzymes: Recent Labs  Lab 10/30/17 1234 10/30/17 1530 10/30/17 2043 10/31/17 0550  TROPONINI 0.18* 0.25* 0.24* 0.13*   BNP (last 3 results) No results for input(s): PROBNP in the last 8760 hours. HbA1C: No results for  input(s): HGBA1C in the last 72 hours. CBG: No results for input(s): GLUCAP in the last 168 hours. Lipid Profile: No results for input(s): CHOL, HDL, LDLCALC, TRIG, CHOLHDL, LDLDIRECT in the last 72 hours. Thyroid Function Tests: No results for input(s): TSH, T4TOTAL, FREET4, T3FREE, THYROIDAB in the last 72 hours. Anemia Panel: No results for input(s): VITAMINB12, FOLATE, FERRITIN, TIBC, IRON, RETICCTPCT in the last 72 hours. Sepsis Labs: No results for input(s): PROCALCITON, LATICACIDVEN in the last 168 hours.  Recent Results (from the past 240 hour(s))  Urine culture     Status: Abnormal   Collection Time: 10/30/17  1:02 PM  Result Value Ref Range Status   Specimen Description URINE, CLEAN CATCH  Final   Special Requests NONE  Final   Culture MULTIPLE SPECIES PRESENT, SUGGEST RECOLLECTION (A)  Final   Report Status 10/31/2017 FINAL  Final  Respiratory Panel by PCR     Status: None   Collection Time: 10/30/17  7:10 PM  Result Value Ref Range Status   Adenovirus NOT DETECTED NOT DETECTED Final   Coronavirus 229E NOT DETECTED NOT DETECTED Final   Coronavirus HKU1 NOT DETECTED NOT DETECTED Final   Coronavirus NL63 NOT DETECTED NOT DETECTED Final   Coronavirus OC43 NOT DETECTED NOT DETECTED Final   Metapneumovirus NOT DETECTED NOT DETECTED Final   Rhinovirus / Enterovirus NOT DETECTED NOT DETECTED Final   Influenza A NOT DETECTED NOT DETECTED Final   Influenza B NOT DETECTED NOT DETECTED Final   Parainfluenza Virus 1 NOT DETECTED NOT DETECTED Final   Parainfluenza Virus 2 NOT DETECTED NOT DETECTED Final   Parainfluenza Virus 3 NOT DETECTED NOT DETECTED Final   Parainfluenza Virus 4 NOT DETECTED NOT DETECTED Final   Respiratory Syncytial Virus NOT DETECTED NOT DETECTED Final   Bordetella pertussis NOT DETECTED NOT DETECTED Final   Chlamydophila pneumoniae NOT DETECTED NOT DETECTED Final   Mycoplasma pneumoniae NOT DETECTED NOT DETECTED Final         Radiology Studies: Dg  Chest 2 View  Result Date: 10/30/2017 CLINICAL DATA:  Short of breath chest pain EXAM: CHEST  2 VIEW COMPARISON:  09/23/2016 FINDINGS: Pulmonary hyperinflation with COPD and scarring. Improvement in mild bibasilar airspace disease likely atelectasis. Minimal pleural effusion. Negative for edema. Apical scarring bilaterally. Coronary artery stents. IMPRESSION: COPD with improvement in bibasilar airspace disease. Small pleural effusions. Electronically Signed   By: Marlan Palau M.D.   On: 10/30/2017 08:52        Scheduled Meds: . apixaban  2.5 mg Oral BID  . aspirin EC  81 mg Oral Q M,W,F  . atorvastatin  20 mg Oral Daily  . furosemide  40 mg Intravenous Daily  . levothyroxine  88 mcg Oral Once per day on Mon Tue Wed Thu Fri   Continuous Infusions:   LOS: 1 day    Dron Jaynie Collins, MD [facility] Pager [phone]  If 7PM-7AM, please contact night-coverage [email or web address] Password [email or web address] 10/31/2017, 12:52 PM

## 2017-10-31 NOTE — Evaluation (Signed)
Physical Therapy Evaluation Patient Details Name: Chelsea Fuller MRN: [id number] DOB: [birth year] Today's Date: 10/31/2017   History of Present Illness  84 yo female with onset of chest pain and flu symptoms was admitted, has subacute CHF and elevated but stable troponins with hypoxia.  PMHx:  CAD, CKD, CVA, HTN, EF25-55%, a-fib, mitral and tricuspid regurg.    Clinical Impression  Pt is up to walk with assistance and noted her O2 sats were stable on room air for a short walk.  Will continue with strengthening and balance work as needed to increase her stability, and progress to longer walks without O2 as tolerated.  Her plan is to follow up with home health services as she is able to walk with no O2 and should be able to make that transition, but will monitor for any changes in her abilities.    Follow Up Recommendations Home health PT;Supervision for mobility/OOB    Equipment Recommendations  Rolling walker with 5" wheels(if hers is not in good shape)    Recommendations for Other Services       Precautions / Restrictions Precautions Precautions: Fall(telemetry) Restrictions Weight Bearing Restrictions: No      Mobility  Bed Mobility Overal bed mobility: Needs Assistance Bed Mobility: Supine to Sit     Supine to sit: Min assist     General bed mobility comments: min assist to power up from side of bed to sit and control her balance,   Transfers Overall transfer level: Needs assistance Equipment used: Rolling walker (2 wheeled);1 person hand held assist Transfers: Sit to/from Stand Sit to Stand: Min guard;Min assist         General transfer comment: min guard to stand from elevated surface but min assist to slow down descent to chair  Ambulation/Gait Ambulation/Gait assistance: Min guard Ambulation Distance (Feet): 20 Feet Assistive device: Rolling walker (2 wheeled);1 person hand held assist Gait Pattern/deviations: Step-to pattern;Shuffle;Decreased stride  length;Wide base of support;Trunk flexed Gait velocity: reduced Gait velocity interpretation: Below normal speed for age/gender General Gait Details: slow pace with some fatigue afterward  Stairs            Wheelchair Mobility    Modified Rankin (Stroke Patients Only)       Balance Overall balance assessment: Needs assistance Sitting-balance support: Feet supported Sitting balance-Leahy Scale: Fair     Standing balance support: Bilateral upper extremity supported;During functional activity Standing balance-Leahy Scale: Poor                               Pertinent Vitals/Pain Pain Assessment: No/denies pain    Home Living Family/patient expects to be discharged to:: Private residence Living Arrangements: Alone Available Help at Discharge: Family;Available PRN/intermittently Type of Home: House Home Access: Stairs to enter   Entrance Stairs-Number of Steps: 1 Home Layout: One level Home Equipment: Walker - 2 wheels;Cane - single point Additional Comments: has been mostly on [facility] per pt    Prior Function Level of Independence: Independent with assistive device(s)               Hand Dominance        Extremity/Trunk Assessment   Upper Extremity Assessment Upper Extremity Assessment: Overall WFL for tasks assessed    Lower Extremity Assessment Lower Extremity Assessment: Generalized weakness    Cervical / Trunk Assessment Cervical / Trunk Assessment: Kyphotic  Communication   Communication: No difficulties  Cognition Arousal/Alertness: Awake/alert Behavior During Therapy: [facility]  for tasks assessed/performed Overall Cognitive Status: Within Functional Limits for tasks assessed                                        General Comments General comments (skin integrity, edema, etc.): Pt was taken off O2 for test of her mobility and noted her sats to remain at 94% with the effort.  Nursing notified and left O2 in close range so  pt could add if feeling SOB but is maintaining sats    Exercises     Assessment/Plan    PT Assessment Patient needs continued PT services  PT Problem List Decreased strength;Decreased range of motion;Decreased activity tolerance;Decreased balance;Decreased mobility;Decreased coordination;Decreased knowledge of use of DME;Decreased safety awareness;Cardiopulmonary status limiting activity       PT Treatment Interventions DME instruction;Gait training;Functional mobility training;Therapeutic activities;Therapeutic exercise;Balance training;Neuromuscular re-education;Patient/family education;Stair training    PT Goals (Current goals can be found in the Care Plan section)  Acute Rehab PT Goals Patient Stated Goal: to get stronger and feel better PT Goal Formulation: With patient Time For Goal Achievement: 11/14/17 Potential to Achieve Goals: Good    Frequency Min 3X/week   Barriers to discharge Decreased caregiver support home alone but her son at hosp and lives near daughter    Co-evaluation               AM-PAC PT "6 Clicks" Daily Activity  Outcome Measure Difficulty turning over in bed (including adjusting bedclothes, sheets and blankets)?: A Little Difficulty moving from lying on back to sitting on the side of the bed? : Unable Difficulty sitting down on and standing up from a chair with arms (e.g., wheelchair, bedside commode, etc,.)?: Unable Help needed moving to and from a bed to chair (including a wheelchair)?: A Little Help needed walking in hospital room?: A Little Help needed climbing 3-5 steps with a railing? : A Lot 6 Click Score: 13    End of Session Equipment Utilized During Treatment: Gait belt;Oxygen Activity Tolerance: Patient tolerated treatment well;Patient limited by fatigue Patient left: in chair;with call bell/phone within reach;with chair alarm set Nurse Communication: Mobility status;Other (comment)(O2 sats with no cannula) PT Visit Diagnosis:  Unsteadiness on feet (R26.81);Muscle weakness (generalized) (M62.81);Difficulty in walking, not elsewhere classified (R26.2);Adult, failure to thrive (R62.7)    Time: 0935-1006 PT Time Calculation (min) (ACUTE ONLY): 31 min   Charges:   PT Evaluation $PT Eval Moderate Complexity: 1 Mod PT Treatments $Gait Training: 8-22 mins   PT G Codes:   PT G-Codes **NOT FOR INPATIENT CLASS** Functional Assessment Tool Used: AM-PAC 6 Clicks Basic Mobility    Ivar DrapeRuth E Arlo Buffone 10/31/2017, 12:59 PM   Samul Dadauth Cosmo Tetreault, PT MS Acute Rehab Dept. Number: [facility] [phone] and [facility] [phone]

## 2017-11-01 DIAGNOSIS — I5033 Acute on chronic diastolic (congestive) heart failure: Secondary | ICD-10-CM

## 2017-11-01 DIAGNOSIS — I2510 Atherosclerotic heart disease of native coronary artery without angina pectoris: Secondary | ICD-10-CM

## 2017-11-01 DIAGNOSIS — N183 Chronic kidney disease, stage 3 (moderate): Secondary | ICD-10-CM

## 2017-11-01 LAB — BASIC METABOLIC PANEL
Anion gap: 10 (ref 5–15)
BUN: 15 mg/dL (ref 6–20)
CHLORIDE: 99 mmol/L — AB (ref 101–111)
CO2: 29 mmol/L (ref 22–32)
Calcium: 9 mg/dL (ref 8.9–10.3)
Creatinine, Ser: 1.2 mg/dL — ABNORMAL HIGH (ref 0.44–1.00)
GFR, EST AFRICAN AMERICAN: 44 mL/min — AB (ref >60)
GFR, EST NON AFRICAN AMERICAN: 38 mL/min — AB (ref >60)
Glucose, Bld: 108 mg/dL — ABNORMAL HIGH (ref 65–99)
POTASSIUM: 3.5 mmol/L (ref 3.5–5.1)
SODIUM: 138 mmol/L (ref 135–145)

## 2017-11-01 LAB — MAGNESIUM: Magnesium: 2.1 mg/dL (ref 1.7–2.4)

## 2017-11-01 NOTE — Progress Notes (Signed)
D/c home  With h/h and daughter and son IV and tele roomed

## 2017-11-01 NOTE — Progress Notes (Signed)
Progress Note  Patient Name: Chelsea Fuller Date of Encounter: 11/01/2017  Primary Cardiologist: Chelsea Noe, MD   Subjective   Feeling well.  Wants to go home.    Inpatient Medications    Scheduled Meds: . apixaban  2.5 mg Oral BID  . aspirin EC  81 mg Oral Q M,W,F  . atorvastatin  20 mg Oral Daily  . furosemide  40 mg Oral Daily  . furosemide  40 mg Oral Q M,W,F-1800  . levothyroxine  88 mcg Oral Once per day on Mon Tue Wed Thu Fri   Continuous Infusions:  PRN Meds: acetaminophen, nitroGLYCERIN, ondansetron (ZOFRAN) IV   Vital Signs    Vitals:   10/31/17 1641 10/31/17 2028 11/01/17 0400 11/01/17 0451  BP:  (!) 157/54  (!) 148/48  Pulse: 65 71  70  Resp:  18  18  Temp:  97.7 F (36.5 C)  98 F (36.7 C)  TempSrc:  Oral  Oral  SpO2: 96% 94%  96%  Weight:   134 lb 9.6 oz (61.1 kg)   Height:        Intake/Output Summary (Last 24 hours) at 11/01/2017 0930 Last data filed at 11/01/2017 0810 Gross per 24 hour  Intake 420 ml  Output 575 ml  Net -155 ml   Filed Weights   10/30/17 2324 10/31/17 0528 11/01/17 0400  Weight: 136 lb 14.4 oz (62.1 kg) 136 lb 3.2 oz (61.8 kg) 134 lb 9.6 oz (61.1 kg)    Telemetry    Sinus rhythm.  No events.  - Personally Reviewed  ECG     - Personally Reviewed  Physical Exam   VS:  BP (!) 148/48 (BP Location: Right Arm)   Pulse 70   Temp 98 F (36.7 C) (Oral)   Resp 18   Ht 5\' 4"  (1.626 m)   Wt 134 lb 9.6 oz (61.1 kg)   SpO2 96%   BMI 23.10 kg/m  , BMI Body mass index is 23.1 kg/m. GENERAL:  Well appearing HEENT: Pupils equal round and reactive, fundi not visualized, oral mucosa unremarkable NECK:  No jugular venous distention, waveform within normal limits, carotid upstroke brisk and symmetric, no bruits, no thyromegaly LYMPHATICS:  No cervical adenopathy LUNGS:  Clear to auscultation bilaterally HEART:  RRR.  PMI not displaced or sustained,S1 and S2 within normal limits, no S3, no S4, no clicks, no rubs,  no murmurs ABD:  Flat, positive bowel sounds normal in frequency in pitch, no bruits, no rebound, no guarding, no midline pulsatile mass, no hepatomegaly, no splenomegaly EXT:  2 plus pulses throughout, no edema, no cyanosis no clubbing SKIN:  No rashes no nodules NEURO:  Cranial nerves II through XII grossly intact, motor grossly intact throughout [facility]:  Cognitively intact, oriented to person place and time   Labs    Chemistry Recent Labs  Lab 10/30/17 0751 10/31/17 0550 11/01/17 0451  NA 140 138 138  K 4.5 3.6 3.5  CL 105 101 99*  CO2 28 27 29   GLUCOSE 149* 96 108*  BUN 16 16 15   CREATININE 1.25* 1.19* 1.20*  CALCIUM 9.0 8.6* 9.0  GFRNONAA 36* 39* 38*  GFRAA 42* 45* 44*  ANIONGAP 7 10 10      Hematology Recent Labs  Lab 10/30/17 0751  WBC 8.4  RBC 3.80*  HGB 11.4*  HCT 37.4  MCV 98.4  MCH 30.0  MCHC 30.5  RDW 13.4  PLT 145*    Cardiac Enzymes Recent Labs  Lab 10/30/17 1234 10/30/17 1530 10/30/17 2043 10/31/17 0550  TROPONINI 0.18* 0.25* 0.24* 0.13*    Recent Labs  Lab 10/30/17 0803  TROPIPOC 0.05     BNP Recent Labs  Lab 10/30/17 1254  BNP 661.1*     DDimer No results for input(s): DDIMER in the last 168 hours.   Radiology    No results found.  Cardiac Studies   Echo 10/31/17: Study Conclusions  - Left ventricle: Distal septal and apical hypokinesis Systolic   function was normal. The estimated ejection fraction was in the   range of 50% to 55%. - Aortic valve: There was mild regurgitation. - Mitral valve: Severely calcified annulus. Mildly thickened   leaflets . There was mild regurgitation. - Left atrium: The atrium was mildly dilated. - Right atrium: The atrium was mildly dilated. - Atrial septum: No defect or patent foramen ovale was identified.  Patient Profile     Chelsea Fuller is a 69F with chronic systolic and diastolic heart failure, CAD s/p STEMI and LAD PCI, hypertension, persistent atrial fibrillation, prior stroke  and CDK III herew with acute on chronic heart failure and chest pain  Assessment & Plan    # Acute on chronic diastolic heart failure: LVEF improved to 50-55%.  She is euvolemic this AM and feeling well.  It seems that this admission may have been caused by her eating chips and salsa.  We discussed the importance of limiting salt intake.  She will continue to take lasix at 40mg  daily and take an extra dose if her weight goes up by 2 lb in a day or 5 lb in a week.  Continue Eliquis.  Technically she should be on 5mg .  However, given her age and the fact that her weight and renal function are borderline, this is reasonable.    # CAD s/p STEMI: # Hyperlipidemia:  Continue aspirin and atorvastatin.  For questions or updates, please contact [facility] Please consult [email or web address] for contact info under Cardiology/STEMI.      Signed, Chelsea Siiffany [place], MD  11/01/2017, 9:30 AM

## 2017-11-01 NOTE — Care Management Note (Addendum)
Case Management Note  Patient Details  Name: Chelsea Fuller MRN: [id number] Date of Birth: 07/06/1924  Subjective/Objective:    Admitted for Congestive Heart Failure               Action/Plan: In to speak with patient, son at bedside  [facility] for recommendation of HH PT and RN.  Patient/[facility] selected [facility].  Referral called to Enrique SackKendra at [facility] at [phone].  Referral accepted.  Verified Fax# [phone].  Needs the following faxed: Demographics, H & P, face to face and Discharge summary.  Patient Home DME: [employer]-2 wheels; [employer], Paediatric nursehower chair.  Patient states she is independent of ADls and she does the cooking.  Reminded patient of importance of not using salt and offered [facility] for disease management. Patient states she weighs daily.  [place] [facility] [place] and PCP is [name]. Denies inability to afford food or medications.  Her daughter takes her to medical appointments and does the shopping. Expected Discharge Date:  11/04/17               Expected Discharge Plan:  Home w Home Health Services  Discharge planning Services  CM Consult  Post Acute Care Choice:  Home Health Choice offered to:  Patient, Adult Children  HH Arranged:  [facility], [facility]  Status of Service:  Completed, signed off   Yancey FlemingsKimberly R Becton, RN  Nurse Case Manager-orientation 11/01/2017, 10:37 AM   --- 11/08/2017 1:18PM Home Health orders faxed to [facility] at [phone], along with face to face, discharge summary, H&P and face sheet.  Cloyde ReamsKimberly Becton, BSN, RN Nurse Case Manager-orientation [phone]

## 2017-11-01 NOTE — Discharge Summary (Addendum)
Physician Discharge Summary  Karl PockKathleen L Murtagh XLK:[id number] DOB: 06/03/1924 DOA: 10/30/2017  PCP: Nila NephewGreen, Edwin, MD  Admit date: 10/30/2017 Discharge date: 11/01/2017  Admitted From:home Disposition:home  Recommendations for Outpatient Follow-up:  1. Follow up with PCP in 1-2 weeks 2. Please obtain BMP/CBC in one week   Home Health:yes Equipment/Devices:none Discharge Condition:stable CODE STATUS:DNR Diet recommendation:heart healthy  Brief/Interim Summary: 84 y.o.femalewith a Past Medical History of Afib on Eliquis; hypothyroidism; HTN; CVA; CAD s/p PCI; and CKD presented with chest pain and shortness of breath.  Patient with elevated troponin and  acute CHF exacerbation.  #Chest pain/shortness of breath/hypoxia in the setting of mild acute on chronic diastolic CHF/ischemic cardiomyopathy: -Patient was treated with IV Lasix with clinical improvement.  Denied chest pain, shortness of breath.  Echo improved with EF 50-55%.  She is euvolemic on exam.  Plan to continue oral Lasix 40 mg daily.  Recommended low-salt diet and follow-up with PCP and cardiologist.  Patient verbalized understanding.  She was evaluated by PT OT.  Discharge with home care service and visiting RN.  Patient is stable on discharge with outpatient follow-up.  #Chronic atrial fibrillation: Monitor heart rate.  On Eliquis.  Monitor heart rate  #Chronic kidney disease stage III creatinine is stable.  Follow-up labs outpatient.  #Hypertension: Monitor blood pressure.  Continue current medication.  #Hypothyroidism: On Synthroid.   Discharge Diagnoses:  Principal Problem:   Chest pain Active Problems:   Acute on chronic diastolic CHF (congestive heart failure) (HCC)   Ischemic cardiomyopathy   CAD (coronary artery disease)-DES/LAD 2017   Hypertension   Hypoxia   Acute on chronic systolic heart failure (HCC)   Hypothyroidism, adult   Permanent atrial fibrillation (HCC)   CKD (chronic kidney disease)  stage 3, GFR 30-59 ml/min (HCC)   Fever    Discharge Instructions  Discharge Instructions    (HEART FAILURE PATIENTS) Call MD:  Anytime you have any of the following symptoms: 1) 3 pound weight gain in 24 hours or 5 pounds in 1 week 2) shortness of breath, with or without a dry hacking cough 3) swelling in the hands, feet or stomach 4) if you have to sleep on extra pillows at night in order to breathe.   Complete by:  As directed    Call MD for:  difficulty breathing, headache or visual disturbances   Complete by:  As directed    Call MD for:  extreme fatigue   Complete by:  As directed    Call MD for:  hives   Complete by:  As directed    Call MD for:  persistant dizziness or light-headedness   Complete by:  As directed    Call MD for:  persistant nausea and vomiting   Complete by:  As directed    Call MD for:  severe uncontrolled pain   Complete by:  As directed    Call MD for:  temperature >100.4   Complete by:  As directed    Diet - low sodium heart healthy   Complete by:  As directed    Increase activity slowly   Complete by:  As directed      Allergies as of 11/01/2017   No Known Allergies     Medication List    TAKE these medications   acetaminophen 500 MG tablet Commonly known as:  TYLENOL Take 500 mg by mouth at bedtime as needed for mild pain.   ALREX 0.2 % Susp Generic drug:  loteprednol Place 1 drop into  both eyes 2 (two) times daily.   apixaban 2.5 MG Tabs tablet Commonly known as:  ELIQUIS Take 1 tablet (2.5 mg total) by mouth 2 (two) times daily.   aspirin EC 81 MG tablet Take 1 tablet (81 mg total) by mouth every Monday, Wednesday, and Friday.   atorvastatin 20 MG tablet Commonly known as:  LIPITOR Take 20 mg by mouth daily.   carvedilol 6.25 MG tablet Commonly known as:  COREG TAKE 1 TABLET (6.25 MG TOTAL) BY MOUTH 2 (TWO) TIMES DAILY WITH A MEAL.   furosemide 40 MG tablet Commonly known as:  LASIX Take 1 tablet (40 mg total) by mouth  daily.   levothyroxine 88 MCG tablet Commonly known as:  SYNTHROID, LEVOTHROID Take 88 mcg by mouth See admin instructions. Monday-Friday only   multivitamin-lutein Caps capsule Take 1 capsule by mouth daily.   nitroGLYCERIN 0.4 MG SL tablet Commonly known as:  NITROSTAT Place 0.4 mg under the tongue every 5 (five) minutes as needed for chest pain.   Potassium Chloride ER 20 MEQ Tbcr Take 20 mEq by mouth daily.      Follow-up Information    Nila Nephew, MD. Schedule an appointment as soon as possible for a visit in 3 week(s).   Specialty:  Internal Medicine Contact information: [address] [place] [address] [phone]        [facility] Follow up.   Specialty:  Home Health Services Why:  Will call to set up visits Contact information: [address] [place] [address] [phone]          No Known Allergies  Consultations: Cardiology  Procedures/Studies: Echo  Subjective: Seen and examined at bedside.  Reported feeling good.  Wanted to go home.  Denies headache, dizziness, nausea vomiting chest pain shortness of breath.  Discharge Exam: Vitals:   10/31/17 2028 11/01/17 0451  BP: (!) 157/54 (!) 148/48  Pulse: 71 70  Resp: 18 18  Temp: 97.7 F (36.5 C) 98 F (36.7 C)  SpO2: 94% 96%   Vitals:   10/31/17 1641 10/31/17 2028 11/01/17 0400 11/01/17 0451  BP:  (!) 157/54  (!) 148/48  Pulse: 65 71  70  Resp:  18  18  Temp:  97.7 F (36.5 C)  98 F (36.7 C)  TempSrc:  Oral  Oral  SpO2: 96% 94%  96%  Weight:   61.1 kg (134 lb 9.6 oz)   Height:        General: Pt is alert, awake, not in acute distress Cardiovascular: RRR, S1/S2 +, no rubs, no gallops Respiratory: CTA bilaterally, no wheezing, no rhonchi Abdominal: Soft, NT, ND, bowel sounds + Extremities: no edema, no cyanosis    The results of significant diagnostics from this hospitalization (including imaging, microbiology, ancillary and  laboratory) are listed below for reference.     Microbiology: Recent Results (from the past 240 hour(s))  Urine culture     Status: Abnormal   Collection Time: 10/30/17  1:02 PM  Result Value Ref Range Status   Specimen Description URINE, CLEAN CATCH  Final   Special Requests NONE  Final   Culture MULTIPLE SPECIES PRESENT, SUGGEST RECOLLECTION (A)  Final   Report Status 10/31/2017 FINAL  Final  Culture, blood (Routine X 2) w Reflex to ID Panel     Status: None (Preliminary result)   Collection Time: 10/30/17  3:30 PM  Result Value Ref Range Status   Specimen Description BLOOD LEFT ARM  Final   Special Requests   Final    BOTTLES DRAWN AEROBIC AND ANAEROBIC Blood Culture adequate volume   Culture NO GROWTH < 24 HOURS  Final   Report Status PENDING  Incomplete  Culture, blood (Routine X 2) w Reflex to ID Panel     Status: None (Preliminary result)   Collection Time: 10/30/17  4:25 PM  Result Value Ref Range Status   Specimen Description BLOOD LEFT ARM  Final   Special Requests   Final    BOTTLES DRAWN AEROBIC AND ANAEROBIC Blood Culture adequate volume   Culture NO GROWTH < 24 HOURS  Final   Report Status PENDING  Incomplete  Respiratory Panel by PCR     Status: None   Collection Time: 10/30/17  7:10 PM  Result Value Ref Range Status   Adenovirus NOT DETECTED NOT DETECTED Final   Coronavirus 229E NOT DETECTED NOT DETECTED Final   Coronavirus HKU1 NOT DETECTED NOT DETECTED Final   Coronavirus NL63 NOT DETECTED NOT DETECTED Final   Coronavirus OC43 NOT DETECTED NOT DETECTED Final   Metapneumovirus NOT DETECTED NOT DETECTED Final   Rhinovirus / Enterovirus NOT DETECTED NOT DETECTED Final   Influenza A NOT DETECTED NOT DETECTED Final   Influenza B NOT DETECTED NOT DETECTED Final   Parainfluenza Virus 1 NOT DETECTED NOT DETECTED Final   Parainfluenza Virus 2 NOT DETECTED NOT DETECTED Final   Parainfluenza Virus 3 NOT DETECTED NOT DETECTED Final   Parainfluenza Virus 4 NOT DETECTED  NOT DETECTED Final   Respiratory Syncytial Virus NOT DETECTED NOT DETECTED Final   Bordetella pertussis NOT DETECTED NOT DETECTED Final   Chlamydophila pneumoniae NOT DETECTED NOT DETECTED Final   Mycoplasma pneumoniae NOT DETECTED NOT DETECTED Final     Labs: BNP (last 3 results) Recent Labs    10/30/17 1254  BNP 661.1*   Basic Metabolic Panel: Recent Labs  Lab 10/30/17 0751 10/31/17 0550 11/01/17 0451  NA 140 138 138  K 4.5 3.6 3.5  CL 105 101 99*  CO2 28 27 29   GLUCOSE 149* 96 108*  BUN 16 16 15   CREATININE 1.25* 1.19* 1.20*  CALCIUM 9.0 8.6* 9.0  MG  --   --  2.1   Liver Function Tests: No results for input(s): AST, ALT, ALKPHOS, BILITOT, PROT, ALBUMIN in the last 168 hours. No results for input(s): LIPASE, AMYLASE in the last 168 hours. No results for input(s): AMMONIA in the last 168 hours. CBC: Recent Labs  Lab 10/30/17 0751  WBC 8.4  HGB 11.4*  HCT 37.4  MCV 98.4  PLT 145*   Cardiac Enzymes: Recent Labs  Lab 10/30/17 1234 10/30/17 1530 10/30/17 2043 10/31/17 0550  TROPONINI 0.18* 0.25* 0.24* 0.13*   BNP: Invalid input(s): POCBNP CBG: Recent Labs  Lab 10/31/17 2051  GLUCAP 144*   D-Dimer No results for input(s): DDIMER in the last 72 hours. Hgb A1c No results for input(s): HGBA1C in the last 72 hours. Lipid Profile No results for input(s): CHOL, HDL, LDLCALC, TRIG, CHOLHDL, LDLDIRECT in the last 72 hours. Thyroid function studies No results for input(s): TSH, T4TOTAL, T3FREE, THYROIDAB in the last 72 hours.  Invalid input(s): FREET3 Anemia work up No results for input(s): VITAMINB12, FOLATE, FERRITIN, TIBC, IRON, RETICCTPCT in the last 72 hours. Urinalysis    Component Value Date/Time   COLORURINE YELLOW 10/30/2017 1302   APPEARANCEUR CLEAR 10/30/2017 1302   LABSPEC <1.005 (L) 10/30/2017 1302   PHURINE 6.0 10/30/2017 1302   GLUCOSEU NEGATIVE 10/30/2017 1302  HGBUR MODERATE (A) 10/30/2017 1302   BILIRUBINUR NEGATIVE 10/30/2017  1302   KETONESUR NEGATIVE 10/30/2017 1302   PROTEINUR NEGATIVE 10/30/2017 1302   UROBILINOGEN 1.0 11/18/2008 1705   NITRITE NEGATIVE 10/30/2017 1302   LEUKOCYTESUR NEGATIVE 10/30/2017 1302   Sepsis Labs Invalid input(s): PROCALCITONIN,  WBC,  LACTICIDVEN Microbiology Recent Results (from the past 240 hour(s))  Urine culture     Status: Abnormal   Collection Time: 10/30/17  1:02 PM  Result Value Ref Range Status   Specimen Description URINE, CLEAN CATCH  Final   Special Requests NONE  Final   Culture MULTIPLE SPECIES PRESENT, SUGGEST RECOLLECTION (A)  Final   Report Status 10/31/2017 FINAL  Final  Culture, blood (Routine X 2) w Reflex to ID Panel     Status: None (Preliminary result)   Collection Time: 10/30/17  3:30 PM  Result Value Ref Range Status   Specimen Description BLOOD LEFT ARM  Final   Special Requests   Final    BOTTLES DRAWN AEROBIC AND ANAEROBIC Blood Culture adequate volume   Culture NO GROWTH < 24 HOURS  Final   Report Status PENDING  Incomplete  Culture, blood (Routine X 2) w Reflex to ID Panel     Status: None (Preliminary result)   Collection Time: 10/30/17  4:25 PM  Result Value Ref Range Status   Specimen Description BLOOD LEFT ARM  Final   Special Requests   Final    BOTTLES DRAWN AEROBIC AND ANAEROBIC Blood Culture adequate volume   Culture NO GROWTH < 24 HOURS  Final   Report Status PENDING  Incomplete  Respiratory Panel by PCR     Status: None   Collection Time: 10/30/17  7:10 PM  Result Value Ref Range Status   Adenovirus NOT DETECTED NOT DETECTED Final   Coronavirus 229E NOT DETECTED NOT DETECTED Final   Coronavirus HKU1 NOT DETECTED NOT DETECTED Final   Coronavirus NL63 NOT DETECTED NOT DETECTED Final   Coronavirus OC43 NOT DETECTED NOT DETECTED Final   Metapneumovirus NOT DETECTED NOT DETECTED Final   Rhinovirus / Enterovirus NOT DETECTED NOT DETECTED Final   Influenza A NOT DETECTED NOT DETECTED Final   Influenza B NOT DETECTED NOT DETECTED  Final   Parainfluenza Virus 1 NOT DETECTED NOT DETECTED Final   Parainfluenza Virus 2 NOT DETECTED NOT DETECTED Final   Parainfluenza Virus 3 NOT DETECTED NOT DETECTED Final   Parainfluenza Virus 4 NOT DETECTED NOT DETECTED Final   Respiratory Syncytial Virus NOT DETECTED NOT DETECTED Final   Bordetella pertussis NOT DETECTED NOT DETECTED Final   Chlamydophila pneumoniae NOT DETECTED NOT DETECTED Final   Mycoplasma pneumoniae NOT DETECTED NOT DETECTED Final     Time coordinating discharge: 28 minutes  SIGNED:   Maxie Barb, MD  [facility] 11/01/2017, 11:32 AM  If 7PM-7AM, please contact night-coverage [email or web address] Password [email or web address]

## 2017-11-01 NOTE — Plan of Care (Signed)
  Progressing Clinical Measurements: Ability to maintain clinical measurements within normal limits will improve 11/01/2017 0211 - Progressing by Olive BassFutrell, Edilson Vital E, RN Will remain free from infection 11/01/2017 0211 - Progressing by Olive BassFutrell, Andreal Vultaggio E, RN Diagnostic test results will improve 11/01/2017 0211 - Progressing by Olive BassFutrell, Gwenevere Goga E, RN Respiratory complications will improve 11/01/2017 0211 - Progressing by Olive BassFutrell, Malu Pellegrini E, RN Cardiovascular complication will be avoided 11/01/2017 0211 - Progressing by Olive BassFutrell, Milka Windholz E, RN Activity: Risk for activity intolerance will decrease 11/01/2017 0211 - Progressing by Olive BassFutrell, Hillard Goodwine E, RN Elimination: Will not experience complications related to urinary retention 11/01/2017 0211 - Progressing by Olive BassFutrell, Tobey Schmelzle E, RN Safety: Ability to remain free from injury will improve 11/01/2017 0211 - Progressing by Olive BassFutrell, Ekaterina Denise E, RN Skin Integrity: Risk for impaired skin integrity will decrease 11/01/2017 0211 - Progressing by Olive BassFutrell, Tyrann Donaho E, RN

## 2017-11-03 ENCOUNTER — Encounter: Payer: Self-pay | Admitting: Interventional Cardiology

## 2017-11-04 ENCOUNTER — Telehealth: Payer: Self-pay | Admitting: Interventional Cardiology

## 2017-11-04 ENCOUNTER — Encounter (HOSPITAL_COMMUNITY): Payer: Self-pay | Admitting: Emergency Medicine

## 2017-11-04 ENCOUNTER — Emergency Department (HOSPITAL_COMMUNITY): Payer: Medicare Other

## 2017-11-04 ENCOUNTER — Inpatient Hospital Stay (HOSPITAL_COMMUNITY)
Admission: EM | Admit: 2017-11-04 | Discharge: 2017-11-11 | DRG: 315 | Disposition: A | Discharge status: Skilled Nursing Facility | Payer: Medicare Other | Attending: Internal Medicine | Admitting: Internal Medicine

## 2017-11-04 DIAGNOSIS — I252 Old myocardial infarction: Secondary | ICD-10-CM

## 2017-11-04 DIAGNOSIS — I739 Peripheral vascular disease, unspecified: Secondary | ICD-10-CM | POA: Diagnosis present

## 2017-11-04 DIAGNOSIS — R945 Abnormal results of liver function studies: Secondary | ICD-10-CM | POA: Diagnosis present

## 2017-11-04 DIAGNOSIS — R7881 Bacteremia: Secondary | ICD-10-CM | POA: Diagnosis present

## 2017-11-04 DIAGNOSIS — N183 Chronic kidney disease, stage 3 unspecified: Secondary | ICD-10-CM | POA: Diagnosis present

## 2017-11-04 DIAGNOSIS — Y848 Other medical procedures as the cause of abnormal reaction of the patient, or of later complication, without mention of misadventure at the time of the procedure: Secondary | ICD-10-CM | POA: Diagnosis present

## 2017-11-04 DIAGNOSIS — I255 Ischemic cardiomyopathy: Secondary | ICD-10-CM | POA: Diagnosis present

## 2017-11-04 DIAGNOSIS — B9562 Methicillin resistant Staphylococcus aureus infection as the cause of diseases classified elsewhere: Secondary | ICD-10-CM | POA: Diagnosis present

## 2017-11-04 DIAGNOSIS — E860 Dehydration: Secondary | ICD-10-CM | POA: Diagnosis not present

## 2017-11-04 DIAGNOSIS — N3000 Acute cystitis without hematuria: Secondary | ICD-10-CM | POA: Insufficient documentation

## 2017-11-04 DIAGNOSIS — I482 Chronic atrial fibrillation: Secondary | ICD-10-CM | POA: Diagnosis present

## 2017-11-04 DIAGNOSIS — Z7901 Long term (current) use of anticoagulants: Secondary | ICD-10-CM

## 2017-11-04 DIAGNOSIS — D631 Anemia in chronic kidney disease: Secondary | ICD-10-CM | POA: Diagnosis present

## 2017-11-04 DIAGNOSIS — Z7982 Long term (current) use of aspirin: Secondary | ICD-10-CM

## 2017-11-04 DIAGNOSIS — Z66 Do not resuscitate: Secondary | ICD-10-CM | POA: Diagnosis present

## 2017-11-04 DIAGNOSIS — I4891 Unspecified atrial fibrillation: Secondary | ICD-10-CM | POA: Diagnosis present

## 2017-11-04 DIAGNOSIS — I5042 Chronic combined systolic (congestive) and diastolic (congestive) heart failure: Secondary | ICD-10-CM | POA: Diagnosis present

## 2017-11-04 DIAGNOSIS — E039 Hypothyroidism, unspecified: Secondary | ICD-10-CM | POA: Diagnosis present

## 2017-11-04 DIAGNOSIS — I130 Hypertensive heart and chronic kidney disease with heart failure and stage 1 through stage 4 chronic kidney disease, or unspecified chronic kidney disease: Secondary | ICD-10-CM | POA: Diagnosis present

## 2017-11-04 DIAGNOSIS — D649 Anemia, unspecified: Secondary | ICD-10-CM

## 2017-11-04 DIAGNOSIS — E872 Acidosis: Secondary | ICD-10-CM | POA: Diagnosis present

## 2017-11-04 DIAGNOSIS — I081 Rheumatic disorders of both mitral and tricuspid valves: Secondary | ICD-10-CM | POA: Diagnosis present

## 2017-11-04 DIAGNOSIS — Z79899 Other long term (current) drug therapy: Secondary | ICD-10-CM

## 2017-11-04 DIAGNOSIS — T801XXA Vascular complications following infusion, transfusion and therapeutic injection, initial encounter: Secondary | ICD-10-CM | POA: Diagnosis not present

## 2017-11-04 DIAGNOSIS — E876 Hypokalemia: Secondary | ICD-10-CM | POA: Diagnosis not present

## 2017-11-04 DIAGNOSIS — I2510 Atherosclerotic heart disease of native coronary artery without angina pectoris: Secondary | ICD-10-CM | POA: Diagnosis present

## 2017-11-04 DIAGNOSIS — R7989 Other specified abnormal findings of blood chemistry: Secondary | ICD-10-CM | POA: Diagnosis present

## 2017-11-04 DIAGNOSIS — R509 Fever, unspecified: Secondary | ICD-10-CM

## 2017-11-04 DIAGNOSIS — D696 Thrombocytopenia, unspecified: Secondary | ICD-10-CM | POA: Diagnosis present

## 2017-11-04 DIAGNOSIS — Y828 Other medical devices associated with adverse incidents: Secondary | ICD-10-CM | POA: Diagnosis present

## 2017-11-04 DIAGNOSIS — I10 Essential (primary) hypertension: Secondary | ICD-10-CM | POA: Diagnosis present

## 2017-11-04 DIAGNOSIS — Z8673 Personal history of transient ischemic attack (TIA), and cerebral infarction without residual deficits: Secondary | ICD-10-CM

## 2017-11-04 DIAGNOSIS — R112 Nausea with vomiting, unspecified: Secondary | ICD-10-CM

## 2017-11-04 DIAGNOSIS — Z955 Presence of coronary angioplasty implant and graft: Secondary | ICD-10-CM

## 2017-11-04 DIAGNOSIS — I808 Phlebitis and thrombophlebitis of other sites: Secondary | ICD-10-CM | POA: Diagnosis present

## 2017-11-04 DIAGNOSIS — I4821 Permanent atrial fibrillation: Secondary | ICD-10-CM | POA: Diagnosis present

## 2017-11-04 DIAGNOSIS — R531 Weakness: Secondary | ICD-10-CM

## 2017-11-04 LAB — CBC WITH DIFFERENTIAL/PLATELET
BASOS ABS: 0 10*3/uL (ref 0.0–0.1)
BASOS PCT: 0 %
EOS ABS: 0 10*3/uL (ref 0.0–0.7)
Eosinophils Relative: 0 %
HCT: 34 % — ABNORMAL LOW (ref 36.0–46.0)
Hemoglobin: 10.9 g/dL — ABNORMAL LOW (ref 12.0–15.0)
Lymphocytes Relative: 10 %
Lymphs Abs: 0.8 10*3/uL (ref 0.7–4.0)
MCH: 30.4 pg (ref 26.0–34.0)
MCHC: 32.1 g/dL (ref 30.0–36.0)
MCV: 95 fL (ref 78.0–100.0)
MONO ABS: 0.7 10*3/uL (ref 0.1–1.0)
Monocytes Relative: 9 %
NEUTROS ABS: 6.6 10*3/uL (ref 1.7–7.7)
Neutrophils Relative %: 81 %
PLATELETS: 112 10*3/uL — AB (ref 150–400)
RBC: 3.58 MIL/uL — ABNORMAL LOW (ref 3.87–5.11)
RDW: 13.3 % (ref 11.5–15.5)
WBC: 8.1 10*3/uL (ref 4.0–10.5)

## 2017-11-04 LAB — URINALYSIS, ROUTINE W REFLEX MICROSCOPIC
Bilirubin Urine: NEGATIVE
GLUCOSE, UA: NEGATIVE mg/dL
Ketones, ur: NEGATIVE mg/dL
Nitrite: NEGATIVE
Protein, ur: NEGATIVE mg/dL
Specific Gravity, Urine: 1.009 (ref 1.005–1.030)
pH: 5 (ref 5.0–8.0)

## 2017-11-04 LAB — I-STAT TROPONIN, ED: TROPONIN I, POC: 0.05 ng/mL (ref 0.00–0.08)

## 2017-11-04 LAB — COMPREHENSIVE METABOLIC PANEL
ALT: 24 U/L (ref 14–54)
AST: 42 U/L — AB (ref 15–41)
Albumin: 2.8 g/dL — ABNORMAL LOW (ref 3.5–5.0)
Alkaline Phosphatase: 46 U/L (ref 38–126)
Anion gap: 12 (ref 5–15)
BILIRUBIN TOTAL: 1.1 mg/dL (ref 0.3–1.2)
BUN: 22 mg/dL — AB (ref 6–20)
CALCIUM: 8.3 mg/dL — AB (ref 8.9–10.3)
CO2: 24 mmol/L (ref 22–32)
Chloride: 98 mmol/L — ABNORMAL LOW (ref 101–111)
Creatinine, Ser: 1.41 mg/dL — ABNORMAL HIGH (ref 0.44–1.00)
GFR calc Af Amer: 37 mL/min — ABNORMAL LOW (ref >60)
GFR, EST NON AFRICAN AMERICAN: 32 mL/min — AB (ref >60)
Glucose, Bld: 245 mg/dL — ABNORMAL HIGH (ref 65–99)
POTASSIUM: 3.6 mmol/L (ref 3.5–5.1)
Sodium: 134 mmol/L — ABNORMAL LOW (ref 135–145)
TOTAL PROTEIN: 6.2 g/dL — AB (ref 6.5–8.1)

## 2017-11-04 LAB — CULTURE, BLOOD (ROUTINE X 2)
Culture: NO GROWTH
Culture: NO GROWTH
SPECIAL REQUESTS: ADEQUATE
Special Requests: ADEQUATE

## 2017-11-04 LAB — LIPASE, BLOOD: LIPASE: 55 U/L — AB (ref 11–51)

## 2017-11-04 LAB — I-STAT CG4 LACTIC ACID, ED
LACTIC ACID, VENOUS: 3.37 mmol/L — AB (ref 0.5–1.9)
Lactic Acid, Venous: 2.09 mmol/L (ref 0.5–1.9)

## 2017-11-04 MED ORDER — CARVEDILOL 6.25 MG PO TABS
6.2500 mg | ORAL_TABLET | Freq: Two times a day (BID) | ORAL | Status: DC
  Administered 2017-11-05 – 2017-11-11 (×13): 6.25 mg via ORAL
  Filled 2017-11-04 (×13): qty 1

## 2017-11-04 MED ORDER — ACETAMINOPHEN 325 MG PO TABS
650.0000 mg | ORAL_TABLET | Freq: Four times a day (QID) | ORAL | Status: DC | PRN
  Administered 2017-11-05 – 2017-11-10 (×4): 650 mg via ORAL
  Filled 2017-11-04 (×4): qty 2

## 2017-11-04 MED ORDER — APIXABAN 2.5 MG PO TABS
2.5000 mg | ORAL_TABLET | Freq: Two times a day (BID) | ORAL | Status: DC
  Administered 2017-11-05 – 2017-11-11 (×14): 2.5 mg via ORAL
  Filled 2017-11-04 (×15): qty 1

## 2017-11-04 MED ORDER — ACETAMINOPHEN 650 MG RE SUPP: 650.0000 mg | Freq: Four times a day (QID) | RECTAL | Status: DC | PRN

## 2017-11-04 MED ORDER — PROSIGHT PO TABS
1.0000 | ORAL_TABLET | Freq: Every day | ORAL | Status: DC
  Administered 2017-11-05 – 2017-11-11 (×7): 1 via ORAL
  Filled 2017-11-04 (×7): qty 1

## 2017-11-04 MED ORDER — ACETAMINOPHEN 500 MG PO TABS: 1000.0000 mg | ORAL_TABLET | Freq: Once | ORAL | Status: DC

## 2017-11-04 MED ORDER — ZOLPIDEM TARTRATE 5 MG PO TABS
5.0000 mg | ORAL_TABLET | Freq: Once | ORAL | Status: AC
  Administered 2017-11-05: 5 mg via ORAL
  Filled 2017-11-04: qty 1

## 2017-11-04 MED ORDER — ASPIRIN EC 81 MG PO TBEC
81.0000 mg | DELAYED_RELEASE_TABLET | ORAL | Status: DC
  Administered 2017-11-06 – 2017-11-11 (×3): 81 mg via ORAL
  Filled 2017-11-04 (×5): qty 1

## 2017-11-04 MED ORDER — DEXTROSE 5 % IV SOLN
1.0000 g | Freq: Once | INTRAVENOUS | Status: AC
  Administered 2017-11-04: 1 g via INTRAVENOUS
  Filled 2017-11-04: qty 10

## 2017-11-04 MED ORDER — SODIUM CHLORIDE 0.9 % IV SOLN
INTRAVENOUS | Status: AC
  Administered 2017-11-04: via INTRAVENOUS

## 2017-11-04 MED ORDER — NITROGLYCERIN 0.4 MG SL SUBL: 0.4000 mg | SUBLINGUAL_TABLET | SUBLINGUAL | Status: DC | PRN

## 2017-11-04 MED ORDER — ONDANSETRON HCL 4 MG/2ML IJ SOLN: 4.0000 mg | Freq: Four times a day (QID) | INTRAMUSCULAR | Status: DC | PRN

## 2017-11-04 MED ORDER — LEVOTHYROXINE SODIUM 88 MCG PO TABS
88.0000 ug | ORAL_TABLET | Freq: Every day | ORAL | Status: DC
  Administered 2017-11-05 – 2017-11-11 (×7): 88 ug via ORAL
  Filled 2017-11-04 (×8): qty 1

## 2017-11-04 MED ORDER — SODIUM CHLORIDE 0.9 % IV BOLUS (SEPSIS): 500.0000 mL | Freq: Once | INTRAVENOUS | Status: DC

## 2017-11-04 MED ORDER — SODIUM CHLORIDE 0.9 % IV SOLN
Freq: Once | INTRAVENOUS | Status: AC
  Administered 2017-11-04: 18:00:00 via INTRAVENOUS

## 2017-11-04 MED ORDER — ONDANSETRON HCL 4 MG PO TABS: 4.0000 mg | ORAL_TABLET | Freq: Four times a day (QID) | ORAL | Status: DC | PRN

## 2017-11-04 MED ORDER — ATORVASTATIN CALCIUM 20 MG PO TABS
20.0000 mg | ORAL_TABLET | Freq: Every evening | ORAL | Status: DC
  Administered 2017-11-05: 20 mg via ORAL
  Filled 2017-11-04: qty 1

## 2017-11-04 NOTE — ED Notes (Signed)
Gave pt graham crackers and Sprite, per Dr. Donnald GarrePfeiffer.

## 2017-11-04 NOTE — H&P (Addendum)
History and Physical    LAWREN SEXSON ZOX:[id number] DOB: [birth year] DOA: 11/04/2017  PCP: Nila Nephew, MD  Patient coming from: Home.  Chief Complaint: Weakness.  HPI: Chelsea Fuller is a 84 y.o. female with history of chronic systolic and diastolic CHF, CAD status post PCI in 2017, chronic atrial fibrillation, chronic kidney disease, hypothyroidism presents to the ER with complaints of weakness.  Patient was just recently discharged 4 days ago after being treated for acute CHF.  Patient states since discharge patient has been getting more fatigued and weak.  Yesterday patient had at least 3 episodes of vomiting.  Denies any abdominal pain or diarrhea.  Denies any chest pain or shortness of breath.  ED Course: In the ER patient is found to be febrile and UA was consistent with UTI.  Since patient was indicating some left flank discomfort CT was done which did not show anything acute.  Patient was started on ceftriaxone for UTI.  Patient also started on IV fluids since patient's lactic acid was elevated.  Review of Systems: As per HPI, rest all negative.   Past Medical History:  Diagnosis Date  . CAD (coronary artery disease)    a. 07/2016: anterior STEMI s/p DES x2 to LAD  . CKD (chronic kidney disease), stage III (HCC)   . CVA (cerebral infarction) 2010  . Fall    a. 2014: nasal bone fx.  . Hypertension   . Hypothyroidism   . Ischemic cardiomyopathy    a. EF 25% by cath 08/19/16 then echo showed EF 50-55% 08/20/16.  . Mitral regurgitation    a. mild by echo 07/2016.  . Mitral stenosis    a. mild by echo 07/2016.  Marland Kitchen Permanent atrial fibrillation (HCC)    a. not presently on anticoagulation due to STEMI 07/2016 and risk of triple therapy with DAPT + anticoag.  . Tricuspid regurgitation    a. mod by echo 07/2016.    Past Surgical History:  Procedure Laterality Date  . CARDIAC CATHETERIZATION N/A 08/19/2016   Procedure: Left Heart Cath and Coronary Angiography;   Surgeon: Lyn Records, MD;  LM 30%, pLAD 50%, dLAD 100% s/p  Synergy 2.5 x 24 mm DES, RCA 20%  . CARDIAC CATHETERIZATION N/A 08/19/2016   Procedure: Coronary Stent Intervention;  Surgeon: Lyn Records, MD;  SYNERGY DES 2.5X24 drug eluting stent  . CHOLECYSTECTOMY       reports that  has never smoked. she has never used smokeless tobacco. She reports that she does not drink alcohol or use drugs.  No Known Allergies  Family History  Problem Relation Age of Onset  . Diabetes Mellitus II Son     Prior to Admission medications   Medication Sig Start Date End Date Taking? Authorizing Provider  acetaminophen (TYLENOL) 500 MG tablet Take 500 mg by mouth at bedtime as needed for mild pain.    Yes [provider]  apixaban (ELIQUIS) 2.5 MG TABS tablet Take 1 tablet (2.5 mg total) by mouth 2 (two) times daily. 11/30/16  Yes Lyn Records, MD  aspirin EC 81 MG tablet Take 1 tablet (81 mg total) by mouth every Monday, Wednesday, and Friday. 08/14/17  Yes Lyn Records, MD  atorvastatin (LIPITOR) 20 MG tablet Take 20 mg by mouth every evening.    Yes [provider]  carvedilol (COREG) 6.25 MG tablet TAKE 1 TABLET (6.25 MG TOTAL) BY MOUTH 2 (TWO) TIMES DAILY WITH A MEAL. 08/19/17  Yes Janetta Hora, PA-C  furosemide (LASIX) 40 MG tablet Take 1 tablet (40 mg total) by mouth daily. 09/26/16  Yes Tyrone Nine, MD  levothyroxine (SYNTHROID, LEVOTHROID) 88 MCG tablet Take 88 mcg by mouth See admin instructions. Take 1 tablet (88 mcg) by mouth Monday thru Friday mornings (skip Saturday and Sunday)   Yes [provider]  loteprednol (ALREX) 0.2 % SUSP Place 1 drop into both eyes 2 (two) times daily.   Yes [provider]  multivitamin-lutein (OCUVITE-LUTEIN) CAPS capsule Take 1 capsule by mouth daily.   Yes [provider]  nitroGLYCERIN (NITROSTAT) 0.4 MG SL tablet Place 0.4 mg under the tongue every 5 (five) minutes as needed for chest pain.  07/29/16  Yes  [provider]  potassium chloride 20 MEQ TBCR Take 20 mEq by mouth daily. 09/25/16  Yes Tyrone Nine, MD    Physical Exam: Vitals:   11/04/17 2030 11/04/17 2045 11/04/17 2100 11/04/17 2115  BP: (!) 120/48 (!) 118/50 (!) 116/48 (!) 114/49  Pulse: 63 69 62 63  Resp: 20 19 19 18   Temp:      TempSrc:      SpO2: 100% 98% 99% 99%      Constitutional: Moderately built and nourished. Vitals:   11/04/17 2030 11/04/17 2045 11/04/17 2100 11/04/17 2115  BP: (!) 120/48 (!) 118/50 (!) 116/48 (!) 114/49  Pulse: 63 69 62 63  Resp: 20 19 19 18   Temp:      TempSrc:      SpO2: 100% 98% 99% 99%   Eyes: Anicteric no pallor. ENMT: No discharge from the ears eyes nose or mouth. Neck: No mass palpated no neck rigidity no JVD appreciated. Respiratory: No rhonchi or crepitations. Cardiovascular: S1-S2 heard no murmurs appreciated. Abdomen: Soft nontender bowel sounds present. Musculoskeletal: No edema.  No joint effusion. Skin: No rash.  Skin appears warm. Neurologic: Alert awake oriented to time place and person.  Moves all extremities. Psychiatric: Appears normal.  Normal affect.   Labs on Admission: I have personally reviewed following labs and imaging studies  CBC: Recent Labs  Lab 10/30/17 0751 11/04/17 1807  WBC 8.4 8.1  NEUTROABS  --  6.6  HGB 11.4* 10.9*  HCT 37.4 34.0*  MCV 98.4 95.0  PLT 145* 112*   Basic Metabolic Panel: Recent Labs  Lab 10/30/17 0751 10/31/17 0550 11/01/17 0451 11/04/17 1807  NA 140 138 138 134*  K 4.5 3.6 3.5 3.6  CL 105 101 99* 98*  CO2 28 27 29 24   GLUCOSE 149* 96 108* 245*  BUN 16 16 15  22*  CREATININE 1.25* 1.19* 1.20* 1.41*  CALCIUM 9.0 8.6* 9.0 8.3*  MG  --   --  2.1  --    GFR: Estimated Creatinine Clearance: 22.4 mL/min (A) (by C-G formula based on SCr of 1.41 mg/dL (H)). Liver Function Tests: Recent Labs  Lab 11/04/17 1807  AST 42*  ALT 24  ALKPHOS 46  BILITOT 1.1  PROT 6.2*  ALBUMIN 2.8*   Recent Labs  Lab  11/04/17 1807  LIPASE 55*   No results for input(s): AMMONIA in the last 168 hours. Coagulation Profile: No results for input(s): INR, PROTIME in the last 168 hours. Cardiac Enzymes: Recent Labs  Lab 10/30/17 1234 10/30/17 1530 10/30/17 2043 10/31/17 0550  TROPONINI 0.18* 0.25* 0.24* 0.13*   BNP (last 3 results) No results for input(s): PROBNP in the last 8760 hours. HbA1C: No results for input(s): HGBA1C in the last 72 hours. CBG: Recent Labs  Lab  10/31/17 2051  GLUCAP 144*   Lipid Profile: No results for input(s): CHOL, HDL, LDLCALC, TRIG, CHOLHDL, LDLDIRECT in the last 72 hours. Thyroid Function Tests: No results for input(s): TSH, T4TOTAL, FREET4, T3FREE, THYROIDAB in the last 72 hours. Anemia Panel: No results for input(s): VITAMINB12, FOLATE, FERRITIN, TIBC, IRON, RETICCTPCT in the last 72 hours. Urine analysis:    Component Value Date/Time   COLORURINE YELLOW 11/04/2017 2002   APPEARANCEUR HAZY (A) 11/04/2017 2002   LABSPEC 1.009 11/04/2017 2002   PHURINE 5.0 11/04/2017 2002   GLUCOSEU NEGATIVE 11/04/2017 2002   HGBUR MODERATE (A) 11/04/2017 2002   BILIRUBINUR NEGATIVE 11/04/2017 2002   KETONESUR NEGATIVE 11/04/2017 2002   PROTEINUR NEGATIVE 11/04/2017 2002   UROBILINOGEN 1.0 11/18/2008 1705   NITRITE NEGATIVE 11/04/2017 2002   LEUKOCYTESUR SMALL (A) 11/04/2017 2002   Sepsis Labs: @LABRCNTIP (procalcitonin:4,lacticidven:4) ) Recent Results (from the past 240 hour(s))  Urine culture     Status: Abnormal   Collection Time: 10/30/17  1:02 PM  Result Value Ref Range Status   Specimen Description URINE, CLEAN CATCH  Final   Special Requests NONE  Final   Culture MULTIPLE SPECIES PRESENT, SUGGEST RECOLLECTION (A)  Final   Report Status 10/31/2017 FINAL  Final  Culture, blood (Routine X 2) w Reflex to ID Panel     Status: None   Collection Time: 10/30/17  3:30 PM  Result Value Ref Range Status   Specimen Description BLOOD LEFT ARM  Final   Special  Requests   Final    BOTTLES DRAWN AEROBIC AND ANAEROBIC Blood Culture adequate volume   Culture NO GROWTH 5 DAYS  Final   Report Status 11/04/2017 FINAL  Final  Culture, blood (Routine X 2) w Reflex to ID Panel     Status: None   Collection Time: 10/30/17  4:25 PM  Result Value Ref Range Status   Specimen Description BLOOD LEFT ARM  Final   Special Requests   Final    BOTTLES DRAWN AEROBIC AND ANAEROBIC Blood Culture adequate volume   Culture NO GROWTH 5 DAYS  Final   Report Status 11/04/2017 FINAL  Final  Respiratory Panel by PCR     Status: None   Collection Time: 10/30/17  7:10 PM  Result Value Ref Range Status   Adenovirus NOT DETECTED NOT DETECTED Final   Coronavirus 229E NOT DETECTED NOT DETECTED Final   Coronavirus HKU1 NOT DETECTED NOT DETECTED Final   Coronavirus NL63 NOT DETECTED NOT DETECTED Final   Coronavirus OC43 NOT DETECTED NOT DETECTED Final   Metapneumovirus NOT DETECTED NOT DETECTED Final   Rhinovirus / Enterovirus NOT DETECTED NOT DETECTED Final   Influenza A NOT DETECTED NOT DETECTED Final   Influenza B NOT DETECTED NOT DETECTED Final   Parainfluenza Virus 1 NOT DETECTED NOT DETECTED Final   Parainfluenza Virus 2 NOT DETECTED NOT DETECTED Final   Parainfluenza Virus 3 NOT DETECTED NOT DETECTED Final   Parainfluenza Virus 4 NOT DETECTED NOT DETECTED Final   Respiratory Syncytial Virus NOT DETECTED NOT DETECTED Final   Bordetella pertussis NOT DETECTED NOT DETECTED Final   Chlamydophila pneumoniae NOT DETECTED NOT DETECTED Final   Mycoplasma pneumoniae NOT DETECTED NOT DETECTED Final     Radiological Exams on Admission: Dg Chest 2 View  Result Date: 11/04/2017 CLINICAL DATA:  Fever. EXAM: CHEST  2 VIEW COMPARISON:  10/30/2017 FINDINGS: The heart size and mediastinal contours are within normal limits. Aortic atherosclerosis. Resolution of bilateral pleural effusions. Both lungs are clear. The visualized skeletal  structures are unremarkable. IMPRESSION: No active  cardiopulmonary disease. Interval resolution of bilateral pleural effusions. Aortic Atherosclerosis (ICD10-I70.0). Electronically Signed   By: Signa Kell M.D.   On: 11/04/2017 19:03   Ct Renal Stone Study  Result Date: 11/04/2017 CLINICAL DATA:  84 year old female with recent discharge from hospital for congestive heart failure exacerbation. Complaining of weakness with nausea over the past couple days. Three episodes of vomiting. Left hip pain. History kidney stones 25 years ago. Prior cholecystectomy. Initial encounter. EXAM: CT ABDOMEN AND PELVIS WITHOUT CONTRAST TECHNIQUE: Multidetector CT imaging of the abdomen and pelvis was performed following the standard protocol without IV contrast. COMPARISON:  02/13/2017 renal sonogram.  No comparison CT. FINDINGS: Lower chest: Minimal scarring lung bases. Mild cardiomegaly. Mitral valve calcification. Coronary artery calcification. Hepatobiliary: Taking into account limitation by non contrast imaging, no worrisome hepatic lesion. Post cholecystectomy. No calcified common bile duct stone. Pancreas: Taking into account limitation by non contrast imaging, no pancreatic mass or inflammation. Spleen: Taking into account limitation by non contrast imaging, no splenic mass or enlargement Adrenals/Urinary Tract: No obstructing stone or hydronephrosis. 1 cm right lower pole nonobstructing stone. Right upper pole cysts measuring up to 2 cm. No adrenal lesion. Noncontrast filled imaging urinary bladder without gross abnormality. Stomach/Bowel: Moderate sigmoid diverticulosis without CT evidence of diverticulitis. Appendix not visualized however no right lower quadrant bowel inflammatory process identified. Very small hiatal hernia may be present. No obvious gastric abnormality. Vascular/Lymphatic: Prominent vascular calcifications. No abdominal aortic aneurysm. Tortuous splenic artery. No adenopathy Reproductive: No worrisome adnexal abnormality or uterine abnormality  Other: No free intraperitoneal air or bowel containing hernia. Musculoskeletal: Hip joint degenerative changes. Scoliosis lumbar spine convex right. Superimposed degenerative changes greatest on the right at the L5-S1 level. No worrisome osseous lesion. IMPRESSION: Sigmoid diverticulosis.  No bowel inflammatory process identified. Very small hiatal hernia suspected. Aortic Atherosclerosis (ICD10-I70.0). Aortic branch vessel atherosclerotic changes. No abdominal aortic aneurysm. Coronary artery calcifications. 1 cm nonobstructing right lower pole renal calculus. Right renal cysts. Post cholecystectomy. Bilateral hip joint degenerative changes. Scoliosis lumbar spine with superimposed degenerative changes greatest on the right at the L5-S1 level. Electronically Signed   By: Lacy Duverney M.D.   On: 11/04/2017 18:55    EKG: Independently reviewed.  Normal sinus rhythm with nonspecific T wave changes.  Assessment/Plan Active Problems:   CKD (chronic kidney disease), stage III (HCC)   Ischemic cardiomyopathy   CAD (coronary artery disease)-DES/LAD 2017   Hypertension   Hypothyroidism, adult   Permanent atrial fibrillation (HCC)   Weakness generalized   Weakness    1. Generalized weakness with fever and UTI -patient symptoms likely from deconditioning and a fever from UTI.  Will get physical therapy consult and continue with ceftriaxone follow urine cultures.  For now I am holding of Lasix due to patient's lactic acid levels being high.  Patient is receiving gentle hydration and closely monitor respiratory status.  I have ordered only for 6 hours of fluids. 2. Chronic diastolic and systolic CHF last EF measured last week was 50-55%.  Holding Lasix due to elevated lactic acid and weakness. 3. Chronic atrial fibrillation -chads 2 vascular score more than 2.  Patient is on apixaban and Coreg. 4. Hypothyroidism on Synthroid.  Check TSH. 5. Normocytic normochromic anemia appears to be chronic follow  CBC. 6. CAD status post stenting in 2017 -since patient is complaining of weakness and fatigue we will check cardiac markers and CK levels.  Patient is on statins Coreg Plavix and apixaban.  7. Chronic kidney disease stage II-III creatinine mildly elevated.  Patient receiving gentle hydration.  Recheck metabolic panel in a.m.   DVT prophylaxis: Apixaban. Code Status: DNR. Family Communication: Discussed with patient. Disposition Plan: To be determined. Consults called: Physical therapy. Admission status: Observation.   Gwynn Chalker N KakEduard Closrakandy MD [facility] Pager [phone].  If 7PM-7AM, please contact night-coverage [email or web address] Password [email or web address]  11/04/2017, 11:40 PM

## 2017-11-04 NOTE — Telephone Encounter (Signed)
Left message for patient's daughter to call back. 

## 2017-11-04 NOTE — ED Notes (Signed)
Pt care assumed.  Pt is A&Ox 4.  Family is at bedside but will be leaving soon.  Pt in NAD. Purewick placed on suction.

## 2017-11-04 NOTE — Telephone Encounter (Signed)
New Message   I called the residence to set up a 2 week hospital follow up appt: Daughter said she is not well enough to come into the office and that they had sent an email through the computer, but have not receive response back:  Chelsea Fuller and daughter Chelsea Fuller would like to speak with Dr Katrinka BlazingSmith or rn, she is not eating, all she is doing sleeping, she is not doing very well , she has only drank a half a bottle of ensure all day today.   Is there something they should be doing for her?

## 2017-11-04 NOTE — ED Triage Notes (Signed)
Per [facility], Pt from home with daughter. Pt d/c'd from hospital on 1/11 for CHF exacerbation. Pt complaining of weakness and nausea worsening over the past couple of days. Pt had 3 episodes of vomiting. Pts temporal was 102.6 for EMS .Pt received 1000 mg of tylenol en route. Oral temp 101.2 Pt's sats on room air were 90%. Pt on 2L [place] - sats 98%

## 2017-11-04 NOTE — ED Notes (Signed)
Pt using a Purewick (external female catheter).

## 2017-11-04 NOTE — ED Provider Notes (Signed)
[facility] EMERGENCY DEPARTMENT Provider Note   CSN: [id number] Arrival date & time: 11/04/17  1628     History   Chief Complaint Chief Complaint  Patient presents with  . Weakness  . Emesis    HPI Chelsea Fuller is a 84 y.o. female.  HPI She had a recent hospitalization for CHF.  Her daughter reports that since she came home she has been getting increasingly weak with nausea and vomiting developing for the past 2 days.  Today she reports 3 episodes of vomiting.  She just never started regaining her strength.  Her daughter reports that she has not been eating or drinking much.  The patient reports her only area of pain is occasionally on the left flank, she reports it improves after urination.  She however is not specifically noting burning with urination.  She does endorse chills and weakness.  She has really not had the energy to get up or do much since returning home. Past Medical History:  Diagnosis Date  . CAD (coronary artery disease)    a. 07/2016: anterior STEMI s/p DES x2 to LAD  . CKD (chronic kidney disease), stage III (HCC)   . CVA (cerebral infarction) 2010  . Fall    a. 2014: nasal bone fx.  . Hypertension   . Hypothyroidism   . Ischemic cardiomyopathy    a. EF 25% by cath 08/19/16 then echo showed EF 50-55% 08/20/16.  . Mitral regurgitation    a. mild by echo 07/2016.  . Mitral stenosis    a. mild by echo 07/2016.  Marland Kitchen Permanent atrial fibrillation (HCC)    a. not presently on anticoagulation due to STEMI 07/2016 and risk of triple therapy with DAPT + anticoag.  . Tricuspid regurgitation    a. mod by echo 07/2016.    Patient Active Problem List   Diagnosis Date Noted  . Chest pain 10/30/2017  . Ischemic cardiomyopathy 10/30/2017  . CAD (coronary artery disease)-DES/LAD 2017 10/30/2017  . Hypertension 10/30/2017  . Hypoxia 10/30/2017  . Acute on chronic systolic heart failure (HCC) 10/30/2017  . Hypothyroidism, adult 10/30/2017    . Permanent atrial fibrillation (HCC) 10/30/2017  . CKD (chronic kidney disease) stage 3, GFR 30-59 ml/min (HCC) 10/30/2017  . Fever 10/30/2017  . CKD (chronic kidney disease), stage III (HCC) 09/24/2016  . Accelerated hypertension 09/24/2016  . CAD in native artery 09/24/2016  . Acute on chronic diastolic CHF (congestive heart failure) (HCC) 09/23/2016  . 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 Chronic atrial fibrillation (HCC)   . Epistaxis 10/04/2013  . Anemia 10/04/2013  . Nasal fracture 10/04/2013  . History of CVA (cerebrovascular accident) 10/04/2013  . HTN (hypertension) 10/04/2013  . Hypothyroidism 10/04/2013    Past Surgical History:  Procedure Laterality Date  . CARDIAC CATHETERIZATION N/A 08/19/2016   Procedure: Left Heart Cath and Coronary Angiography;  Surgeon: Lyn Records, MD;  LM 30%, pLAD 50%, dLAD 100% s/p  Synergy 2.5 x 24 mm DES, RCA 20%  . CARDIAC CATHETERIZATION N/A 08/19/2016   Procedure: Coronary Stent Intervention;  Surgeon: Lyn Records, MD;  SYNERGY DES 2.5X24 drug eluting stent  . CHOLECYSTECTOMY      OB History    No data available       Home Medications    Prior to Admission medications   Medication Sig Start Date End Date Taking? Authorizing Provider  acetaminophen (TYLENOL) 500 MG tablet Take 500 mg by mouth at bedtime as needed for  mild pain.    Yes [provider]  apixaban (ELIQUIS) 2.5 MG TABS tablet Take 1 tablet (2.5 mg total) by mouth 2 (two) times daily. 11/30/16  Yes Lyn Records, MD  aspirin EC 81 MG tablet Take 1 tablet (81 mg total) by mouth every Monday, Wednesday, and Friday. 08/14/17  Yes Lyn Records, MD  atorvastatin (LIPITOR) 20 MG tablet Take 20 mg by mouth every evening.    Yes [provider]  carvedilol (COREG) 6.25 MG tablet TAKE 1 TABLET (6.25 MG TOTAL) BY MOUTH 2 (TWO) TIMES DAILY WITH A MEAL. 08/19/17  Yes Janetta Hora, PA-C  furosemide (LASIX) 40 MG tablet Take 1 tablet (40 mg total) by mouth  daily. 09/26/16  Yes Tyrone Nine, MD  levothyroxine (SYNTHROID, LEVOTHROID) 88 MCG tablet Take 88 mcg by mouth See admin instructions. Take 1 tablet (88 mcg) by mouth Monday thru Friday mornings (skip Saturday and Sunday)   Yes [provider]  loteprednol (ALREX) 0.2 % SUSP Place 1 drop into both eyes 2 (two) times daily.   Yes [provider]  multivitamin-lutein (OCUVITE-LUTEIN) CAPS capsule Take 1 capsule by mouth daily.   Yes [provider]  nitroGLYCERIN (NITROSTAT) 0.4 MG SL tablet Place 0.4 mg under the tongue every 5 (five) minutes as needed for chest pain.  07/29/16  Yes [provider]  potassium chloride 20 MEQ TBCR Take 20 mEq by mouth daily. 09/25/16  Yes Tyrone Nine, MD    Family History Family History  Problem Relation Age of Onset  . Diabetes Mellitus II Son     Social History Social History   Tobacco Use  . Smoking status: Never Smoker  . Smokeless tobacco: Never Used  Substance Use Topics  . Alcohol use: No  . Drug use: No     Allergies   Patient has no known allergies.   Review of Systems Review of Systems 10 Systems reviewed and are negative for acute change except as noted in the HPI.   Physical Exam Updated Vital Signs BP (!) 114/49   Pulse 63   Temp (!) 101.2 F (38.4 C) (Oral)   Resp 18   SpO2 99%   Physical Exam  Constitutional: She is oriented to person, place, and time. She appears well-developed and well-nourished. No distress.  HENT:  Head: Normocephalic and atraumatic.  Mouth/Throat: Oropharynx is clear and moist.  Eyes: Conjunctivae and EOM are normal.  Neck: Neck supple.  Cardiovascular: Normal rate and regular rhythm.  No murmur heard. Pulmonary/Chest: Effort normal and breath sounds normal. No respiratory distress.  Abdominal: Soft. She exhibits no distension. There is no tenderness.  Musculoskeletal: Normal range of motion. She exhibits no edema or tenderness.  Neurological: She is alert  and oriented to person, place, and time. No cranial nerve deficit. She exhibits normal muscle tone. Coordination normal.  Skin: Skin is warm and dry.  Psychiatric: She has a normal mood and affect.  Nursing note and vitals reviewed.    ED Treatments / Results  Labs (all labs ordered are listed, but only abnormal results are displayed) Labs Reviewed  COMPREHENSIVE METABOLIC PANEL - Abnormal; Notable for the following components:      Result Value   Sodium 134 (*)    Chloride 98 (*)    Glucose, Bld 245 (*)    BUN 22 (*)    Creatinine, Ser 1.41 (*)    Calcium 8.3 (*)    Total Protein 6.2 (*)  Albumin 2.8 (*)    AST 42 (*)    GFR calc non Af Amer 32 (*)    GFR calc Af Amer 37 (*)    All other components within normal limits  LIPASE, BLOOD - Abnormal; Notable for the following components:   Lipase 55 (*)    All other components within normal limits  CBC WITH DIFFERENTIAL/PLATELET - Abnormal; Notable for the following components:   RBC 3.58 (*)    Hemoglobin 10.9 (*)    HCT 34.0 (*)    Platelets 112 (*)    All other components within normal limits  URINALYSIS, ROUTINE W REFLEX MICROSCOPIC - Abnormal; Notable for the following components:   APPearance HAZY (*)    Hgb urine dipstick MODERATE (*)    Leukocytes, UA SMALL (*)    Bacteria, UA RARE (*)    Squamous Epithelial / LPF 0-5 (*)    All other components within normal limits  I-STAT CG4 LACTIC ACID, ED - Abnormal; Notable for the following components:   Lactic Acid, Venous 2.09 (*)    All other components within normal limits  CULTURE, BLOOD (ROUTINE X 2)  CULTURE, BLOOD (ROUTINE X 2)  URINE CULTURE  I-STAT TROPONIN, ED  I-STAT CG4 LACTIC ACID, ED    EKG  EKG Interpretation  Date/Time:  Monday November 04 2017 17:46:14 EST Ventricular Rate:  73 PR Interval:    QRS Duration: 104 QT Interval:  435 QTC Calculation: 480 R Axis:   45 Text Interpretation:  Sinus rhythm Low voltage, precordial leads Nonspecific T  abnormalities, anterior leads agree. no STEMI. anterior T wave inversion increased compared to previous Confirmed by Arby Barrette [phone]) on 11/04/2017 7:27:19 PM       Radiology Dg Chest 2 View  Result Date: 11/04/2017 CLINICAL DATA:  Fever. EXAM: CHEST  2 VIEW COMPARISON:  10/30/2017 FINDINGS: The heart size and mediastinal contours are within normal limits. Aortic atherosclerosis. Resolution of bilateral pleural effusions. Both lungs are clear. The visualized skeletal structures are unremarkable. IMPRESSION: No active cardiopulmonary disease. Interval resolution of bilateral pleural effusions. Aortic Atherosclerosis (ICD10-I70.0). Electronically Signed   By: Signa Kell M.D.   On: 11/04/2017 19:03   Ct Renal Stone Study  Result Date: 11/04/2017 CLINICAL DATA:  84 year old female with recent discharge from hospital for congestive heart failure exacerbation. Complaining of weakness with nausea over the past couple days. Three episodes of vomiting. Left hip pain. History kidney stones 25 years ago. Prior cholecystectomy. Initial encounter. EXAM: CT ABDOMEN AND PELVIS WITHOUT CONTRAST TECHNIQUE: Multidetector CT imaging of the abdomen and pelvis was performed following the standard protocol without IV contrast. COMPARISON:  02/13/2017 renal sonogram.  No comparison CT. FINDINGS: Lower chest: Minimal scarring lung bases. Mild cardiomegaly. Mitral valve calcification. Coronary artery calcification. Hepatobiliary: Taking into account limitation by non contrast imaging, no worrisome hepatic lesion. Post cholecystectomy. No calcified common bile duct stone. Pancreas: Taking into account limitation by non contrast imaging, no pancreatic mass or inflammation. Spleen: Taking into account limitation by non contrast imaging, no splenic mass or enlargement Adrenals/Urinary Tract: No obstructing stone or hydronephrosis. 1 cm right lower pole nonobstructing stone. Right upper pole cysts measuring up to 2 cm. No  adrenal lesion. Noncontrast filled imaging urinary bladder without gross abnormality. Stomach/Bowel: Moderate sigmoid diverticulosis without CT evidence of diverticulitis. Appendix not visualized however no right lower quadrant bowel inflammatory process identified. Very small hiatal hernia may be present. No obvious gastric abnormality. Vascular/Lymphatic: Prominent vascular calcifications. No abdominal aortic aneurysm. Tortuous  splenic artery. No adenopathy Reproductive: No worrisome adnexal abnormality or uterine abnormality Other: No free intraperitoneal air or bowel containing hernia. Musculoskeletal: Hip joint degenerative changes. Scoliosis lumbar spine convex right. Superimposed degenerative changes greatest on the right at the L5-S1 level. No worrisome osseous lesion. IMPRESSION: Sigmoid diverticulosis.  No bowel inflammatory process identified. Very small hiatal hernia suspected. Aortic Atherosclerosis (ICD10-I70.0). Aortic branch vessel atherosclerotic changes. No abdominal aortic aneurysm. Coronary artery calcifications. 1 cm nonobstructing right lower pole renal calculus. Right renal cysts. Post cholecystectomy. Bilateral hip joint degenerative changes. Scoliosis lumbar spine with superimposed degenerative changes greatest on the right at the L5-S1 level. Electronically Signed   By: Lacy DuverneySteven  Olson M.D.   On: 11/04/2017 18:55    Procedures Procedures (including critical care time)  Medications Ordered in ED Medications  cefTRIAXone (ROCEPHIN) 1 g in dextrose 5 % 50 mL IVPB (not administered)  0.9 %  sodium chloride infusion ( Intravenous New Bag/Given 11/04/17 1818)     Initial Impression / Assessment and Plan / ED Course  I have reviewed the triage vital signs and the nursing notes.  Pertinent labs & imaging results that were available during my care of the patient were reviewed by me and considered in my medical decision making (see chart for details).     Consult: Dr. Toniann FailKakrakandy for  admission. Final Clinical Impressions(s) / ED Diagnoses   Final diagnoses:  Dehydration  Acute cystitis without hematuria  Nausea and vomiting, intractability of vomiting not specified, unspecified vomiting type  Fever, unspecified fever cause   Patient presents as outlined above with increasing general weakness and poor oral intake since discharge from the hospital.  She does present with fever 101.2, loss of appetite and vomiting and positive urinalysis.  Fluids have been initiated at a rate of 125/h.  Patient was recently admitted for CHF and diuresed.  Heart Rate and blood pressure are stable.  Patient has not been given sepsis weight-based fluid resuscitation.  Will admit for IV antibiotics and fluids.  Patient did have vomiting today.  Will need IV antibiotics and fluids until taking oral intake well again and improved baseline function. ED Discharge Orders    None       Arby BarrettePfeiffer, Esraa Seres, MD 11/04/17 2148

## 2017-11-05 ENCOUNTER — Other Ambulatory Visit (HOSPITAL_COMMUNITY): Payer: Medicare Other

## 2017-11-05 ENCOUNTER — Other Ambulatory Visit: Payer: Self-pay

## 2017-11-05 DIAGNOSIS — A4901 Methicillin susceptible Staphylococcus aureus infection, unspecified site: Secondary | ICD-10-CM | POA: Diagnosis not present

## 2017-11-05 DIAGNOSIS — I808 Phlebitis and thrombophlebitis of other sites: Secondary | ICD-10-CM | POA: Diagnosis present

## 2017-11-05 DIAGNOSIS — R945 Abnormal results of liver function studies: Secondary | ICD-10-CM | POA: Diagnosis present

## 2017-11-05 DIAGNOSIS — R740 Nonspecific elevation of levels of transaminase and lactic acid dehydrogenase [LDH]: Secondary | ICD-10-CM | POA: Diagnosis not present

## 2017-11-05 DIAGNOSIS — N3000 Acute cystitis without hematuria: Secondary | ICD-10-CM | POA: Diagnosis present

## 2017-11-05 DIAGNOSIS — Z8673 Personal history of transient ischemic attack (TIA), and cerebral infarction without residual deficits: Secondary | ICD-10-CM | POA: Diagnosis not present

## 2017-11-05 DIAGNOSIS — R509 Fever, unspecified: Secondary | ICD-10-CM | POA: Diagnosis not present

## 2017-11-05 DIAGNOSIS — A4902 Methicillin resistant Staphylococcus aureus infection, unspecified site: Secondary | ICD-10-CM | POA: Diagnosis not present

## 2017-11-05 DIAGNOSIS — I130 Hypertensive heart and chronic kidney disease with heart failure and stage 1 through stage 4 chronic kidney disease, or unspecified chronic kidney disease: Secondary | ICD-10-CM | POA: Diagnosis present

## 2017-11-05 DIAGNOSIS — D649 Anemia, unspecified: Secondary | ICD-10-CM | POA: Diagnosis not present

## 2017-11-05 DIAGNOSIS — Y848 Other medical procedures as the cause of abnormal reaction of the patient, or of later complication, without mention of misadventure at the time of the procedure: Secondary | ICD-10-CM | POA: Diagnosis present

## 2017-11-05 DIAGNOSIS — E872 Acidosis: Secondary | ICD-10-CM | POA: Diagnosis present

## 2017-11-05 DIAGNOSIS — Y828 Other medical devices associated with adverse incidents: Secondary | ICD-10-CM | POA: Diagnosis present

## 2017-11-05 DIAGNOSIS — R7881 Bacteremia: Secondary | ICD-10-CM

## 2017-11-05 DIAGNOSIS — Z66 Do not resuscitate: Secondary | ICD-10-CM | POA: Diagnosis present

## 2017-11-05 DIAGNOSIS — R7989 Other specified abnormal findings of blood chemistry: Secondary | ICD-10-CM | POA: Diagnosis present

## 2017-11-05 DIAGNOSIS — I081 Rheumatic disorders of both mitral and tricuspid valves: Secondary | ICD-10-CM

## 2017-11-05 DIAGNOSIS — E039 Hypothyroidism, unspecified: Secondary | ICD-10-CM

## 2017-11-05 DIAGNOSIS — I482 Chronic atrial fibrillation: Secondary | ICD-10-CM

## 2017-11-05 DIAGNOSIS — I252 Old myocardial infarction: Secondary | ICD-10-CM | POA: Diagnosis not present

## 2017-11-05 DIAGNOSIS — I255 Ischemic cardiomyopathy: Secondary | ICD-10-CM | POA: Diagnosis present

## 2017-11-05 DIAGNOSIS — N183 Chronic kidney disease, stage 3 (moderate): Secondary | ICD-10-CM | POA: Diagnosis present

## 2017-11-05 DIAGNOSIS — I361 Nonrheumatic tricuspid (valve) insufficiency: Secondary | ICD-10-CM | POA: Diagnosis not present

## 2017-11-05 DIAGNOSIS — I739 Peripheral vascular disease, unspecified: Secondary | ICD-10-CM | POA: Diagnosis present

## 2017-11-05 DIAGNOSIS — B9562 Methicillin resistant Staphylococcus aureus infection as the cause of diseases classified elsewhere: Secondary | ICD-10-CM | POA: Diagnosis present

## 2017-11-05 DIAGNOSIS — E860 Dehydration: Secondary | ICD-10-CM | POA: Diagnosis present

## 2017-11-05 DIAGNOSIS — I5042 Chronic combined systolic (congestive) and diastolic (congestive) heart failure: Secondary | ICD-10-CM | POA: Diagnosis present

## 2017-11-05 DIAGNOSIS — N189 Chronic kidney disease, unspecified: Secondary | ICD-10-CM | POA: Diagnosis not present

## 2017-11-05 DIAGNOSIS — I129 Hypertensive chronic kidney disease with stage 1 through stage 4 chronic kidney disease, or unspecified chronic kidney disease: Secondary | ICD-10-CM | POA: Diagnosis not present

## 2017-11-05 DIAGNOSIS — D696 Thrombocytopenia, unspecified: Secondary | ICD-10-CM | POA: Diagnosis present

## 2017-11-05 DIAGNOSIS — I10 Essential (primary) hypertension: Secondary | ICD-10-CM

## 2017-11-05 DIAGNOSIS — I2510 Atherosclerotic heart disease of native coronary artery without angina pectoris: Secondary | ICD-10-CM | POA: Diagnosis present

## 2017-11-05 DIAGNOSIS — Z955 Presence of coronary angioplasty implant and graft: Secondary | ICD-10-CM | POA: Diagnosis not present

## 2017-11-05 DIAGNOSIS — R531 Weakness: Secondary | ICD-10-CM | POA: Diagnosis not present

## 2017-11-05 DIAGNOSIS — T801XXA Vascular complications following infusion, transfusion and therapeutic injection, initial encounter: Secondary | ICD-10-CM | POA: Diagnosis present

## 2017-11-05 DIAGNOSIS — I4891 Unspecified atrial fibrillation: Secondary | ICD-10-CM | POA: Diagnosis present

## 2017-11-05 DIAGNOSIS — R112 Nausea with vomiting, unspecified: Secondary | ICD-10-CM

## 2017-11-05 DIAGNOSIS — E876 Hypokalemia: Secondary | ICD-10-CM | POA: Diagnosis not present

## 2017-11-05 DIAGNOSIS — D631 Anemia in chronic kidney disease: Secondary | ICD-10-CM | POA: Diagnosis present

## 2017-11-05 DIAGNOSIS — N289 Disorder of kidney and ureter, unspecified: Secondary | ICD-10-CM | POA: Diagnosis not present

## 2017-11-05 DIAGNOSIS — I809 Phlebitis and thrombophlebitis of unspecified site: Secondary | ICD-10-CM | POA: Diagnosis not present

## 2017-11-05 LAB — BLOOD CULTURE ID PANEL (REFLEXED)
Acinetobacter baumannii: NOT DETECTED
CANDIDA GLABRATA: NOT DETECTED
CANDIDA TROPICALIS: NOT DETECTED
Candida albicans: NOT DETECTED
Candida krusei: NOT DETECTED
Candida parapsilosis: NOT DETECTED
ENTEROBACTER CLOACAE COMPLEX: NOT DETECTED
ESCHERICHIA COLI: NOT DETECTED
Enterobacteriaceae species: NOT DETECTED
Enterococcus species: NOT DETECTED
Haemophilus influenzae: NOT DETECTED
KLEBSIELLA PNEUMONIAE: NOT DETECTED
Klebsiella oxytoca: NOT DETECTED
Listeria monocytogenes: NOT DETECTED
Methicillin resistance: DETECTED — AB
NEISSERIA MENINGITIDIS: NOT DETECTED
PROTEUS SPECIES: NOT DETECTED
Pseudomonas aeruginosa: NOT DETECTED
STAPHYLOCOCCUS SPECIES: DETECTED — AB
Serratia marcescens: NOT DETECTED
Staphylococcus aureus (BCID): DETECTED — AB
Streptococcus agalactiae: NOT DETECTED
Streptococcus pneumoniae: NOT DETECTED
Streptococcus pyogenes: NOT DETECTED
Streptococcus species: NOT DETECTED

## 2017-11-05 LAB — BASIC METABOLIC PANEL
Anion gap: 13 (ref 5–15)
BUN: 20 mg/dL (ref 6–20)
CALCIUM: 8 mg/dL — AB (ref 8.9–10.3)
CO2: 23 mmol/L (ref 22–32)
Chloride: 101 mmol/L (ref 101–111)
Creatinine, Ser: 1.2 mg/dL — ABNORMAL HIGH (ref 0.44–1.00)
GFR calc Af Amer: 44 mL/min — ABNORMAL LOW (ref >60)
GFR, EST NON AFRICAN AMERICAN: 38 mL/min — AB (ref >60)
GLUCOSE: 160 mg/dL — AB (ref 65–99)
POTASSIUM: 3.6 mmol/L (ref 3.5–5.1)
Sodium: 137 mmol/L (ref 135–145)

## 2017-11-05 LAB — CBC
HEMATOCRIT: 32.9 % — AB (ref 36.0–46.0)
HEMOGLOBIN: 10.7 g/dL — AB (ref 12.0–15.0)
MCH: 30.8 pg (ref 26.0–34.0)
MCHC: 32.5 g/dL (ref 30.0–36.0)
MCV: 94.8 fL (ref 78.0–100.0)
Platelets: 98 10*3/uL — ABNORMAL LOW (ref 150–400)
RBC: 3.47 MIL/uL — ABNORMAL LOW (ref 3.87–5.11)
RDW: 13.3 % (ref 11.5–15.5)
WBC: 9.6 10*3/uL (ref 4.0–10.5)

## 2017-11-05 LAB — LACTIC ACID, PLASMA
Lactic Acid, Venous: 1.1 mmol/L (ref 0.5–1.9)
Lactic Acid, Venous: 1 mmol/L (ref 0.5–1.9)

## 2017-11-05 LAB — TROPONIN I
TROPONIN I: 0.07 ng/mL — AB (ref <0.03)
Troponin I: 0.06 ng/mL (ref <0.03)

## 2017-11-05 MED ORDER — VANCOMYCIN HCL 500 MG IV SOLR
500.0000 mg | INTRAVENOUS | Status: DC
  Filled 2017-11-05: qty 500

## 2017-11-05 MED ORDER — SODIUM CHLORIDE 0.9 % IV SOLN
1250.0000 mg | Freq: Once | INTRAVENOUS | Status: AC
  Administered 2017-11-05: 1250 mg via INTRAVENOUS
  Filled 2017-11-05: qty 1250

## 2017-11-05 MED ORDER — DEXTROSE 5 % IV SOLN
1.0000 g | INTRAVENOUS | Status: DC
  Filled 2017-11-05: qty 10

## 2017-11-05 NOTE — Consult Note (Signed)
[facility]    Date of Admission:  11/04/2017           Day 2 ceftriaxone        Day 1 vancomycin       Reason for Consult: Automatic consultation for MRSA bacteremia     Assessment: [name] has MRSA bacteremia related to recent septic thrombophlebitis caused by an IV when she was hospitalized recently for heart failure.  Vancomycin has been started.  She does not have any symptoms suggesting a urinary tract infection.  I will stop ceftriaxone.  She will need repeat blood cultures and transthoracic echocardiogram soon.  Plan: 1. Continue vancomycin 2. Discontinue ceftriaxone 3. Repeat blood cultures tomorrow 4. Repeat transthoracic echocardiogram  Principal Problem:   MRSA bacteremia Active Problems:   Septic thrombophlebitis of upper extremity   CKD (chronic kidney disease), stage III (HCC)   Ischemic cardiomyopathy   CAD (coronary artery disease)-DES/LAD 2017   Hypertension   Hypothyroidism, adult   Permanent atrial fibrillation (HCC)   Weakness generalized   Weakness   Scheduled Meds: . apixaban  2.5 mg Oral BID  . [START ON 11/06/2017] aspirin EC  81 mg Oral Q M,W,F  . atorvastatin  20 mg Oral QPM  . carvedilol  6.25 mg Oral BID WC  . levothyroxine  88 mcg Oral QAC breakfast  . multivitamin  1 tablet Oral Daily   Continuous Infusions: . [START ON 11/06/2017] vancomycin     PRN Meds:.acetaminophen **OR** acetaminophen, nitroGLYCERIN, ondansetron **OR** ondansetron (ZOFRAN) IV  HPI: Chelsea Fuller is a 84 y.o. female with a history of coronary artery disease, peripheral artery disease, atrial fibrillation and chronic kidney disease who was recently hospitalized from 10/30/2017 until 11/01/2017 and treated for heart failure.  Her daughter states that she was extremely weak on the day of discharge and grew progressively weak until being readmitted yesterday.  She was noted to be febrile.  Both admission blood cultures have grown  MRSA.  She recalls having an IV in her left arm that infiltrated and became very sore during her recent admission.   Review of Systems: Review of Systems  Constitutional: Positive for chills, fever and malaise/fatigue. Negative for diaphoresis and weight loss.  HENT: Negative for congestion and sore throat.   Respiratory: Positive for shortness of breath. Negative for cough and sputum production.   Cardiovascular: Negative for chest pain.  Gastrointestinal: Positive for nausea and vomiting. Negative for abdominal pain and diarrhea.  Genitourinary: Negative for dysuria, frequency and urgency.  Musculoskeletal: Negative for back pain.  Skin: Negative for rash.  Neurological: Positive for weakness and headaches. Negative for dizziness.    Past Medical History:  Diagnosis Date  . CAD (coronary artery disease)    a. 07/2016: anterior STEMI s/p DES x2 to LAD  . CKD (chronic kidney disease), stage III (HCC)   . CVA (cerebral infarction) 2010  . Fall    a. 2014: nasal bone fx.  . Hypertension   . Hypothyroidism   . Ischemic cardiomyopathy    a. EF 25% by cath 08/19/16 then echo showed EF 50-55% 08/20/16.  . Mitral regurgitation    a. mild by echo 07/2016.  . Mitral stenosis    a. mild by echo 07/2016.  Marland Kitchen Permanent atrial fibrillation (HCC)    a. not presently on anticoagulation due to STEMI 07/2016 and risk of triple therapy with DAPT + anticoag.  . Tricuspid regurgitation  a. mod by echo 07/2016.    Social History   Tobacco Use  . Smoking status: Never Smoker  . Smokeless tobacco: Never Used  Substance Use Topics  . Alcohol use: No  . Drug use: No    Family History  Problem Relation Age of Onset  . Diabetes Mellitus II Son    No Known Allergies  OBJECTIVE: Blood pressure (!) 132/39, pulse 71, temperature 98.7 F (37.1 C), temperature source Oral, resp. rate 18, height 5\' 4"  (1.626 m), weight 131 lb 13.4 oz (59.8 kg), SpO2 94 %.  Physical Exam  Constitutional: She  is oriented to person, place, and time.  She is resting quietly in bed.  She tells me she needs to take a nap.  Eyes: Conjunctivae are normal.  Neck: Neck supple.  Cardiovascular: Normal rate and regular rhythm.  No murmur heard. Pulmonary/Chest: Effort normal and breath sounds normal. She has no wheezes. She has no rales.  Abdominal: Soft. She exhibits no distension. There is no tenderness.  Musculoskeletal: Normal range of motion. She exhibits no edema or tenderness.  Neurological: She is alert and oriented to person, place, and time.  Skin: No rash noted.  She has a thrombosed segment of left antecubital vein just above her recent IV site.  Psychiatric: Mood and affect normal.    Lab Results Lab Results  Component Value Date   WBC 9.6 11/05/2017   HGB 10.7 (L) 11/05/2017   HCT 32.9 (L) 11/05/2017   MCV 94.8 11/05/2017   PLT 98 (L) 11/05/2017    Lab Results  Component Value Date   CREATININE 1.20 (H) 11/05/2017   BUN 20 11/05/2017   NA 137 11/05/2017   K 3.6 11/05/2017   CL 101 11/05/2017   CO2 23 11/05/2017    Lab Results  Component Value Date   ALT 24 11/04/2017   AST 42 (H) 11/04/2017   ALKPHOS 46 11/04/2017   BILITOT 1.1 11/04/2017     Microbiology: Recent Results (from the past 240 hour(s))  Urine culture     Status: Abnormal   Collection Time: 10/30/17  1:02 PM  Result Value Ref Range Status   Specimen Description URINE, CLEAN CATCH  Final   Special Requests NONE  Final   Culture MULTIPLE SPECIES PRESENT, SUGGEST RECOLLECTION (A)  Final   Report Status 10/31/2017 FINAL  Final  Culture, blood (Routine X 2) w Reflex to ID Panel     Status: None   Collection Time: 10/30/17  3:30 PM  Result Value Ref Range Status   Specimen Description BLOOD LEFT ARM  Final   Special Requests   Final    BOTTLES DRAWN AEROBIC AND ANAEROBIC Blood Culture adequate volume   Culture NO GROWTH 5 DAYS  Final   Report Status 11/04/2017 FINAL  Final  Culture, blood (Routine X 2) w  Reflex to ID Panel     Status: None   Collection Time: 10/30/17  4:25 PM  Result Value Ref Range Status   Specimen Description BLOOD LEFT ARM  Final   Special Requests   Final    BOTTLES DRAWN AEROBIC AND ANAEROBIC Blood Culture adequate volume   Culture NO GROWTH 5 DAYS  Final   Report Status 11/04/2017 FINAL  Final  Respiratory Panel by PCR     Status: None   Collection Time: 10/30/17  7:10 PM  Result Value Ref Range Status   Adenovirus NOT DETECTED NOT DETECTED Final   Coronavirus 229E NOT DETECTED NOT DETECTED Final  Coronavirus HKU1 NOT DETECTED NOT DETECTED Final   Coronavirus NL63 NOT DETECTED NOT DETECTED Final   Coronavirus OC43 NOT DETECTED NOT DETECTED Final   Metapneumovirus NOT DETECTED NOT DETECTED Final   Rhinovirus / Enterovirus NOT DETECTED NOT DETECTED Final   Influenza A NOT DETECTED NOT DETECTED Final   Influenza B NOT DETECTED NOT DETECTED Final   Parainfluenza Virus 1 NOT DETECTED NOT DETECTED Final   Parainfluenza Virus 2 NOT DETECTED NOT DETECTED Final   Parainfluenza Virus 3 NOT DETECTED NOT DETECTED Final   Parainfluenza Virus 4 NOT DETECTED NOT DETECTED Final   Respiratory Syncytial Virus NOT DETECTED NOT DETECTED Final   Bordetella pertussis NOT DETECTED NOT DETECTED Final   Chlamydophila pneumoniae NOT DETECTED NOT DETECTED Final   Mycoplasma pneumoniae NOT DETECTED NOT DETECTED Final  Culture, blood (routine x 2)     Status: None (Preliminary result)   Collection Time: 11/04/17  6:08 PM  Result Value Ref Range Status   Specimen Description BLOOD LEFT UPPER ARM  Final   Special Requests   Final    BOTTLES DRAWN AEROBIC AND ANAEROBIC Blood Culture adequate volume   Culture  Setup Time   Final    GRAM POSITIVE COCCI IN BOTH AEROBIC AND ANAEROBIC BOTTLES    Culture GRAM POSITIVE COCCI  Final   Report Status PENDING  Incomplete  Culture, blood (routine x 2)     Status: None (Preliminary result)   Collection Time: 11/04/17  6:20 PM  Result Value Ref  Range Status   Specimen Description BLOOD RIGHT ARM  Final   Special Requests   Final    BOTTLES DRAWN AEROBIC AND ANAEROBIC Blood Culture adequate volume   Culture  Setup Time   Final    GRAM POSITIVE COCCI IN BOTH AEROBIC AND ANAEROBIC BOTTLES Organism ID to follow CRITICAL RESULT CALLED TO, READ BACK BY AND VERIFIED WITH: Patrick North PHARMD 11/05/17 1037 A JW    Culture GRAM POSITIVE COCCI  Final   Report Status PENDING  Incomplete  Blood Culture ID Panel (Reflexed)     Status: Abnormal   Collection Time: 11/04/17  6:20 PM  Result Value Ref Range Status   Enterococcus species NOT DETECTED NOT DETECTED Final   Listeria monocytogenes NOT DETECTED NOT DETECTED Final   Staphylococcus species DETECTED (A) NOT DETECTED Final    Comment: CRITICAL RESULT CALLED TO, READ BACK BY AND VERIFIED WITH: Patrick North PHARMD 11/05/17 1037 A JW    Staphylococcus aureus DETECTED (A) NOT DETECTED Final    Comment: Methicillin (oxacillin)-resistant Staphylococcus aureus (MRSA). MRSA is predictably resistant to beta-lactam antibiotics (except ceftaroline). Preferred therapy is vancomycin unless clinically contraindicated. Patient requires contact precautions if  hospitalized. CRITICAL RESULT CALLED TO, READ BACK BY AND VERIFIED WITH: Patrick North PHARMD 11/05/17 1037 A JW    Methicillin resistance DETECTED (A) NOT DETECTED Final    Comment: CRITICAL RESULT CALLED TO, READ BACK BY AND VERIFIED WITH: Patrick North PHARMD 11/05/17 1037 A JW    Streptococcus species NOT DETECTED NOT DETECTED Final   Streptococcus agalactiae NOT DETECTED NOT DETECTED Final   Streptococcus pneumoniae NOT DETECTED NOT DETECTED Final   Streptococcus pyogenes NOT DETECTED NOT DETECTED Final   Acinetobacter baumannii NOT DETECTED NOT DETECTED Final   Enterobacteriaceae species NOT DETECTED NOT DETECTED Final   Enterobacter cloacae complex NOT DETECTED NOT DETECTED Final   Escherichia coli NOT DETECTED NOT DETECTED Final    Klebsiella oxytoca NOT DETECTED NOT DETECTED Final   Klebsiella pneumoniae NOT  DETECTED NOT DETECTED Final   Proteus species NOT DETECTED NOT DETECTED Final   Serratia marcescens NOT DETECTED NOT DETECTED Final   Haemophilus influenzae NOT DETECTED NOT DETECTED Final   Neisseria meningitidis NOT DETECTED NOT DETECTED Final   Pseudomonas aeruginosa NOT DETECTED NOT DETECTED Final   Candida albicans NOT DETECTED NOT DETECTED Final   Candida glabrata NOT DETECTED NOT DETECTED Final   Candida krusei NOT DETECTED NOT DETECTED Final   Candida parapsilosis NOT DETECTED NOT DETECTED Final   Candida tropicalis NOT DETECTED NOT DETECTED Final    Cliffton AstersJohn Gautham Hewins, MD [facility] [phone] pager   [phone] cell 11/05/2017, 3:02 PM

## 2017-11-05 NOTE — Evaluation (Signed)
Therapy Evaluation Patient Details Name: Chelsea NapKathleen L Desha MRN: [id number] DOB: 09/08/1924 Today's Date: 82/15/2019   History of Present Illness  Pt is a 84 y.o. female admitted 11/04/17 with c/o fatigue and weakness; found to be febrile and UA was consistent with UTI. Of note, recently discharged 1/10 after acute CHF exacerbation. PMH includes CHF, chronic a-fib, hypothyroidism, anemia, CAD (s/p stenting 2017), CKD II-III.    Clinical Impression  Pt presents with decreased awareness, significant instability, and an overall decrease in functional mobility secondary to above. PTA, pt reports mod indep with SPC and lives home alone; daughters live nearby and assist with transportation as pt does not drive. Pt recently d/c from hospital ~4 days ago, but reports progressive weakness and fatigue since being home. Pt demonstrating decreased safety awareness and insight into deficits during today's evaluation, requiring intermittent minA to correct multiple LOB while ambulating with RW. Visual deficits also noted to affect pt's ability to independently complete ADL tasks. Pt would benefit from continued acute PT services to maximize functional mobility and independence prior to d/c with SNF-level therapies.     Follow Up Recommendations SNF;Supervision/Assistance - 24 hour    Equipment Recommendations  None recommended by PT    Recommendations for Other Services OT consult     Precautions / Restrictions Precautions Precautions: Fall Restrictions Weight Bearing Restrictions: No      Mobility  Bed Mobility Overal bed mobility: Needs Assistance Bed Mobility: Supine to Sit     Supine to sit: Min guard;HOB elevated        Transfers Overall transfer level: Needs assistance Equipment used: Rolling walker (2 wheeled) Transfers: Sit to/from Stand Sit to Stand: Min assist;Mod assist         General transfer comment: Educ on hand placement with RW; minA to steady RW standing from bed.  ModA to correct posterior and L-lateral LOB upon standing from toilet  Ambulation/Gait Ambulation/Gait assistance: Min assist Ambulation Distance (Feet): 40 Feet Assistive device: Rolling walker (2 wheeled) Gait Pattern/deviations: Step-to pattern;Shuffle;Trunk flexed Gait velocity: Decreased Gait velocity interpretation: <1.8 ft/sec, indicative of risk for recurrent falls General Gait Details: Slow, unstable amb with RW, requiring intermittent minA to correct LOB. Intermittent cues for safety and proximity with RW, pt tending to run into objects with L-side wheel. Pt with decreased ability to self-correct instability  Stairs            Wheelchair Mobility    Modified Rankin (Stroke Patients Only)       Balance Overall balance assessment: Needs assistance Sitting-balance support: Feet supported Sitting balance-Leahy Scale: Fair Sitting balance - Comments: Can adjust bilat socks sitting EOB with supervision   Standing balance support: Bilateral upper extremity supported;During functional activity Standing balance-Leahy Scale: Poor Standing balance comment: Intermittent minA to correct LOB while standing with UE support                             Pertinent Vitals/Pain Pain Assessment: No/denies pain    Home Living Family/patient expects to be discharged to:: Private residence Living Arrangements: Alone Available Help at Discharge: Family;Available PRN/intermittently Type of Home: House Home Access: Stairs to enter Entrance Stairs-Rails: Left Entrance Stairs-Number of Steps: 1 Home Layout: One level Home Equipment: Walker - 2 wheels;Cane - single point;Tub bench      Prior Function Level of Independence: Needs assistance   Gait / Transfers Assistance Needed: Pt reports mod indep with SPC or RW; mainly uses SPC. Endorses  falls in past 6 weeks  ADL's / Homemaking Assistance Needed: Pt reports mod indep with SPC for ADLs  Comments: Does not drive;  daughters live nearby and drive/assist with errands     Hand Dominance        Extremity/Trunk Assessment   Upper Extremity Assessment Upper Extremity Assessment: Generalized weakness    Lower Extremity Assessment Lower Extremity Assessment: Generalized weakness    Cervical / Trunk Assessment Cervical / Trunk Assessment: Kyphotic  Communication   Communication: No difficulties  Cognition Arousal/Alertness: Awake/alert Behavior During Therapy: WFL for tasks assessed/performed Overall Cognitive Status: No family/caregiver present to determine baseline cognitive functioning Area of Impairment: Attention;Memory;Following commands;Safety/judgement;Awareness;Problem solving                   Current Attention Level: Selective Memory: Decreased short-term memory Following Commands: Follows multi-step commands inconsistently;Follows one step commands with increased time Safety/Judgement: Decreased awareness of safety;Decreased awareness of deficits Awareness: Emergent Problem Solving: Decreased initiation;Requires verbal cues General Comments: Pt also with apparent vision deficits (reports L-side macular degeneration), running into objects on L-side and increased difficulty 'aiming' with apparent overshooting/undershooting (i.e. getting toothbrush under water, applying toothpaste, reaching for paper towel and coffee)      General Comments General comments (skin integrity, edema, etc.): SpO2 96% on 3L O2 [place] sitting EOB. SpO2 >90% on RA throughout treatment session. Pt incontinent of urine upon ambulating to bathroom; unaware this had happened    Exercises     Assessment/Plan    PT Assessment Patient needs continued PT services  PT Problem List Decreased strength;Decreased range of motion;Decreased activity tolerance;Decreased balance;Decreased mobility;Decreased coordination;Decreased knowledge of use of DME;Decreased safety awareness;Cardiopulmonary status limiting  activity;Decreased cognition       PT Treatment Interventions DME instruction;Gait training;Functional mobility training;Therapeutic activities;Therapeutic exercise;Balance training;Neuromuscular re-education;Patient/family education;Stair training    PT Goals (Current goals can be found in the Care Plan section)  Acute Rehab PT Goals Patient Stated Goal: Get stronger PT Goal Formulation: With patient Time For Goal Achievement: 11/19/17 Potential to Achieve Goals: Good    Frequency Min 2X/week   Barriers to discharge Decreased caregiver support      Co-evaluation               AM-PAC PT "6 Clicks" Daily Activity  Outcome Measure Difficulty turning over in bed (including adjusting bedclothes, sheets and blankets)?: None Difficulty moving from lying on back to sitting on the side of the bed? : A Little Difficulty sitting down on and standing up from a chair with arms (e.g., wheelchair, bedside commode, etc,.)?: Unable Help needed moving to and from a bed to chair (including a wheelchair)?: A Little Help needed walking in hospital room?: A Little Help needed climbing 3-5 steps with a railing? : A Lot 6 Click Score: 16    End of Session Equipment Utilized During Treatment: Gait belt Activity Tolerance: Patient tolerated treatment well;Patient limited by fatigue Patient left: in chair;with call bell/phone within reach Nurse Communication: Mobility status PT Visit Diagnosis: Other abnormalities of gait and mobility (R26.89);Muscle weakness (generalized) (M62.81);Repeated falls (R29.6)    Time: 1610-9604 PT Time Calculation (min) (ACUTE ONLY): 32 min   Charges:   PT Evaluation $PT Eval Moderate Complexity: 1 Mod PT Treatments $Therapeutic Activity: 8-22 mins   PT G Codes:       Ina Homes, PT, DPT Acute Rehab Services  Pager: [phone]  Malachy Chamber 11/05/2017, 8:58 AM

## 2017-11-05 NOTE — Progress Notes (Signed)
Pt. Arrived to unit from ED in stable condition. Pt. Responsive. VSS. Call light placed within reach. CCMD notified of pts. Arrival to the unit.

## 2017-11-05 NOTE — ED Notes (Addendum)
Admitting paged: [facility] room F01 Troponin 0.06  Jesse SansJanee RN

## 2017-11-05 NOTE — ED Notes (Signed)
Pt found slid down in bed, legs hanging off and pt pulling blankets off and talking to herself. Fall bracelet and yellow socks applied, bed alarm turned on.

## 2017-11-05 NOTE — Telephone Encounter (Signed)
Pt has been readmitted to hospital. Will await daughter to call back once pt is discharged.

## 2017-11-05 NOTE — Progress Notes (Signed)
PHARMACY - PHYSICIAN COMMUNICATION CRITICAL VALUE ALERT - BLOOD CULTURE IDENTIFICATION (BCID)  Chelsea Fuller is an 84 y.o. female who [facility]   Assessment:  84 yo F presents with weakness. Possible UTI and started on Rocephin. Now growing MRSA in blood.  Name of physician (or Provider) Contacted:  Tish Frederickson. [place]  Current antibiotics: Rocephin  Changes to prescribed antibiotics recommended:  Add vancomycin today Could consider stopping Rocephin if UTI ruled out Recommendations accepted by provider  Results for orders placed or performed during the hospital encounter of 11/04/17  Blood Culture ID Panel (Reflexed) (Collected: 11/04/2017  6:20 PM)  Result Value Ref Range   Enterococcus species NOT DETECTED NOT DETECTED   Listeria monocytogenes NOT DETECTED NOT DETECTED   Staphylococcus species DETECTED (A) NOT DETECTED   Staphylococcus aureus DETECTED (A) NOT DETECTED   Methicillin resistance DETECTED (A) NOT DETECTED   Streptococcus species NOT DETECTED NOT DETECTED   Streptococcus agalactiae NOT DETECTED NOT DETECTED   Streptococcus pneumoniae NOT DETECTED NOT DETECTED   Streptococcus pyogenes NOT DETECTED NOT DETECTED   Acinetobacter baumannii NOT DETECTED NOT DETECTED   Enterobacteriaceae species NOT DETECTED NOT DETECTED   Enterobacter cloacae complex NOT DETECTED NOT DETECTED   Escherichia coli NOT DETECTED NOT DETECTED   Klebsiella oxytoca NOT DETECTED NOT DETECTED   Klebsiella pneumoniae NOT DETECTED NOT DETECTED   Proteus species NOT DETECTED NOT DETECTED   Serratia marcescens NOT DETECTED NOT DETECTED   Haemophilus influenzae NOT DETECTED NOT DETECTED   Neisseria meningitidis NOT DETECTED NOT DETECTED   Pseudomonas aeruginosa NOT DETECTED NOT DETECTED   Candida albicans NOT DETECTED NOT DETECTED   Candida glabrata NOT DETECTED NOT DETECTED   Candida krusei NOT DETECTED NOT DETECTED   Candida parapsilosis NOT DETECTED NOT DETECTED   Candida tropicalis NOT DETECTED NOT DETECTED    [name] 11/05/2017  10:43 AM

## 2017-11-05 NOTE — Progress Notes (Signed)
Pharmacy Antibiotic Note  Chelsea Fuller is a 84 y.o. female admitted on 11/04/2017 with bacteremia.  Pharmacy has been consulted for vancomycin dosing.  Found to have MRSA in blood. ID will be consulted automatically.  Plan: Give vancomycin 1,250mg  IV x 1, then start vancomycin 500mg  IV Q24h Consider stopping Rocephin if UTI ruled out or if GNRs do not grow in urine Monitor clinical picture, renal function, VT at Css F/U C&S, abx deescalation / LOT  Height: 5\' 4"  (162.6 cm) Weight: 131 lb 13.4 oz (59.8 kg) IBW/kg (Calculated) : 54.7  Temp (24hrs), Avg:99.6 F (37.6 C), Min:98.7 F (37.1 C), Max:101.2 F (38.4 C)  Recent Labs  Lab 10/30/17 0751 10/31/17 0550 11/01/17 0451 11/04/17 1807 11/04/17 1828 11/04/17 2145 11/05/17 0332 11/05/17 0640  WBC 8.4  --   --  8.1  --   --  9.6  --   CREATININE 1.25* 1.19* 1.20* 1.41*  --   --  1.20*  --   LATICACIDVEN  --   --   --   --  2.09* 3.37* 1.1 1.0    Estimated Creatinine Clearance: 26.4 mL/min (A) (by C-G formula based on SCr of 1.2 mg/dL (H)).    No Known Allergies   Thank you for allowing pharmacy to be a part of this patient's care.  Chelsea Fuller 11/05/2017 10:43 AM

## 2017-11-05 NOTE — Clinical Social Work Note (Signed)
Clinical Social Work Assessment  Patient Details  Name: Chelsea Fuller MRN: [id number] Date of Birth: 03/19/1924  Date of referral:  11/05/17               Reason for consult:  Facility Placement, Discharge Planning                Permission sought to share information with:  Facility Sport and exercise psychologist, Family Supports Permission granted to share information::  Yes, Verbal Permission Granted  Name::        Agency::     Relationship::  Family at bedside  Contact Information:     Housing/Transportation Living arrangements for the past 2 months:  Single Family Home Source of Information:  Patient, Medical Team, Adult Children Patient Interpreter Needed:  None Criminal Activity/Legal Involvement Pertinent to Current Situation/Hospitalization:  No - Comment as needed Significant Relationships:  Adult Children, Other Family Members Lives with:  Self Do you feel safe going back to the place where you live?  Yes Need for family participation in patient care:  Yes (Comment)  Care giving concerns:  PT recommending SNF once medically stable for discharge.  Social Worker assessment / plan:  CSW met with patient. Family at bedside. CSW introduced role and explained that PT recommendations would be discussed. Patient and family prefer home health. She was recently in the hospital and had been set up with [facility]. Patient was supposed to start PT with them today. RNCM notified. No further concerns. CSW signing off as social work intervention is no longer needed.  Employment status:  Retired Nurse, adult PT Recommendations:  [place] / Referral to community resources:  [place]  Patient/Family's Response to care:  Patient and her family prefer HHPT to SNF. Patient's family supportive and involved in patient's care. Patient and her family appreciated social work intervention.  Patient/Family's  Understanding of and Emotional Response to Diagnosis, Current Treatment, and Prognosis:  Patient and her family have a good understanding of the reason for admission and her need for continued therapy after discharge. Patient and her family appear happy with hospital care.  Emotional Assessment Appearance:  Appears stated age Attitude/Demeanor/Rapport:  Engaged, Gracious Affect (typically observed):  Appropriate, Calm, Pleasant Orientation:  Oriented to Self, Oriented to Place, Oriented to  Time, Oriented to Situation Alcohol / Substance use:  Never Used Psych involvement (Current and /or in the community):  No (Comment)  Discharge Needs  Concerns to be addressed:  Care Coordination Readmission within the last 30 days:  Yes Current discharge risk:  Dependent with Mobility, Lives alone Barriers to Discharge:  Continued Medical Work up   Candie Chroman, LCSW 11/05/2017, 12:16 PM

## 2017-11-05 NOTE — ED Notes (Addendum)
Report given to 3E RN

## 2017-11-05 NOTE — Progress Notes (Signed)
PROGRESS NOTE    Chelsea Fuller  BJY:[id number] DOB: 08/08/1924 DOA: 11/04/2017 PCP: Nila Nephew, MD   Brief Narrative: Chelsea Fuller is a 84 y.o. female with history of chronic systolic and diastolic CHF, CAD status post PCI in 2017, chronic atrial fibrillation, chronic kidney disease, hypothyroidism     Assessment & Plan:   Active Problems:   CKD (chronic kidney disease), stage III (HCC)   Ischemic cardiomyopathy   CAD (coronary artery disease)-DES/LAD 2017   Hypertension   Hypothyroidism, adult   Permanent atrial fibrillation (HCC)   Weakness generalized   Weakness   MRSA bacteremia 2/2 bottles positive. Likely contributing to patient's overall malaise.  -Start Vancomycin -ID recommendations -Transthoracic Echocardiogram   CKD stage III Stable. Baseline of 1.2  Essential hypertension Well controlled. -Continue Coreg  Hypothyroidism -Continue Synthroid  CAD s/p DES x2 in 2017 -continue Lipitor and aspirin  Atrial fibrillation Appears sinus. -Continue Eliquis  ?UTI -urine culture pending -Ceftriaxone; discontinue if negative.   DVT prophylaxis: SCDs, Eliquis Code Status: DNR Family Communication: None at bedside Disposition Plan: Pending medical improvement   Consultants:   Infectious disease  Procedures:   None  Antimicrobials:  Ceftriaxone  Vancomycin    Subjective: Feeling weak.  Objective: Vitals:   11/05/17 0545 11/05/17 0633 11/05/17 0811 11/05/17 1154  BP:  (!) 121/41 (!) 122/52 (!) 132/39  Pulse: 84 79 78 71  Resp: (!) 26 (!) 22 18 18   Temp:  99.3 F (37.4 C) 98.7 F (37.1 C) 98.7 F (37.1 C)  TempSrc:  Oral Oral Oral  SpO2: 99% 98% 100% 94%  Weight:  59.8 kg (131 lb 13.4 oz)    Height:  5\' 4"  (1.626 m)      Intake/Output Summary (Last 24 hours) at 11/05/2017 1209 Last data filed at 11/05/2017 1000 Gross per 24 hour  Intake 650 ml  Output -  Net 650 ml   Filed Weights   11/05/17 0865  Weight: 59.8  kg (131 lb 13.4 oz)    Examination:  General exam: Appears calm and comfortable Respiratory system: Clear to auscultation. Respiratory effort normal. Cardiovascular system: S1 & S2 heard, RRR. No murmurs, rubs, gallops or clicks. Gastrointestinal system: Abdomen is nondistended, soft and nontender. Normal bowel sounds heard. Central nervous system: Alert and oriented. No focal neurological deficits. Extremities: No edema. No calf tenderness Skin: No cyanosis. No rashes Psychiatry: Judgement and insight appear normal. Mood & affect appropriate.     Data Reviewed: I have personally reviewed following labs and imaging studies  CBC: Recent Labs  Lab 10/30/17 0751 11/04/17 1807 11/05/17 0332  WBC 8.4 8.1 9.6  NEUTROABS  --  6.6  --   HGB 11.4* 10.9* 10.7*  HCT 37.4 34.0* 32.9*  MCV 98.4 95.0 94.8  PLT 145* 112* 98*   Basic Metabolic Panel: Recent Labs  Lab 10/30/17 0751 10/31/17 0550 11/01/17 0451 11/04/17 1807 11/05/17 0332  NA 140 138 138 134* 137  K 4.5 3.6 3.5 3.6 3.6  CL 105 101 99* 98* 101  CO2 28 27 29 24 23   GLUCOSE 149* 96 108* 245* 160*  BUN 16 16 15  22* 20  CREATININE 1.25* 1.19* 1.20* 1.41* 1.20*  CALCIUM 9.0 8.6* 9.0 8.3* 8.0*  MG  --   --  2.1  --   --    GFR: Estimated Creatinine Clearance: 26.4 mL/min (A) (by C-G formula based on SCr of 1.2 mg/dL (H)). Liver Function Tests: Recent Labs  Lab 11/04/17 1807  AST  42*  ALT 24  ALKPHOS 46  BILITOT 1.1  PROT 6.2*  ALBUMIN 2.8*   Recent Labs  Lab 11/04/17 1807  LIPASE 55*   No results for input(s): AMMONIA in the last 168 hours. Coagulation Profile: No results for input(s): INR, PROTIME in the last 168 hours. Cardiac Enzymes: Recent Labs  Lab 10/30/17 1530 10/30/17 2043 10/31/17 0550 11/05/17 0332 11/05/17 0640  TROPONINI 0.25* 0.24* 0.13* 0.06* 0.07*   BNP (last 3 results) No results for input(s): PROBNP in the last 8760 hours. HbA1C: No results for input(s): HGBA1C in the last 72  hours. CBG: Recent Labs  Lab 10/31/17 2051  GLUCAP 144*   Lipid Profile: No results for input(s): CHOL, HDL, LDLCALC, TRIG, CHOLHDL, LDLDIRECT in the last 72 hours. Thyroid Function Tests: No results for input(s): TSH, T4TOTAL, FREET4, T3FREE, THYROIDAB in the last 72 hours. Anemia Panel: No results for input(s): VITAMINB12, FOLATE, FERRITIN, TIBC, IRON, RETICCTPCT in the last 72 hours. Sepsis Labs: Recent Labs  Lab 11/04/17 1828 11/04/17 2145 11/05/17 0332 11/05/17 0640  LATICACIDVEN 2.09* 3.37* 1.1 1.0    Recent Results (from the past 240 hour(s))  Urine culture     Status: Abnormal   Collection Time: 10/30/17  1:02 PM  Result Value Ref Range Status   Specimen Description URINE, CLEAN CATCH  Final   Special Requests NONE  Final   Culture MULTIPLE SPECIES PRESENT, SUGGEST RECOLLECTION (A)  Final   Report Status 10/31/2017 FINAL  Final  Culture, blood (Routine X 2) w Reflex to ID Panel     Status: None   Collection Time: 10/30/17  3:30 PM  Result Value Ref Range Status   Specimen Description BLOOD LEFT ARM  Final   Special Requests   Final    BOTTLES DRAWN AEROBIC AND ANAEROBIC Blood Culture adequate volume   Culture NO GROWTH 5 DAYS  Final   Report Status 11/04/2017 FINAL  Final  Culture, blood (Routine X 2) w Reflex to ID Panel     Status: None   Collection Time: 10/30/17  4:25 PM  Result Value Ref Range Status   Specimen Description BLOOD LEFT ARM  Final   Special Requests   Final    BOTTLES DRAWN AEROBIC AND ANAEROBIC Blood Culture adequate volume   Culture NO GROWTH 5 DAYS  Final   Report Status 11/04/2017 FINAL  Final  Respiratory Panel by PCR     Status: None   Collection Time: 10/30/17  7:10 PM  Result Value Ref Range Status   Adenovirus NOT DETECTED NOT DETECTED Final   Coronavirus 229E NOT DETECTED NOT DETECTED Final   Coronavirus HKU1 NOT DETECTED NOT DETECTED Final   Coronavirus NL63 NOT DETECTED NOT DETECTED Final   Coronavirus OC43 NOT DETECTED NOT  DETECTED Final   Metapneumovirus NOT DETECTED NOT DETECTED Final   Rhinovirus / Enterovirus NOT DETECTED NOT DETECTED Final   Influenza A NOT DETECTED NOT DETECTED Final   Influenza B NOT DETECTED NOT DETECTED Final   Parainfluenza Virus 1 NOT DETECTED NOT DETECTED Final   Parainfluenza Virus 2 NOT DETECTED NOT DETECTED Final   Parainfluenza Virus 3 NOT DETECTED NOT DETECTED Final   Parainfluenza Virus 4 NOT DETECTED NOT DETECTED Final   Respiratory Syncytial Virus NOT DETECTED NOT DETECTED Final   Bordetella pertussis NOT DETECTED NOT DETECTED Final   Chlamydophila pneumoniae NOT DETECTED NOT DETECTED Final   Mycoplasma pneumoniae NOT DETECTED NOT DETECTED Final  Culture, blood (routine x 2)     Status:  None (Preliminary result)   Collection Time: 11/04/17  6:08 PM  Result Value Ref Range Status   Specimen Description BLOOD LEFT UPPER ARM  Final   Special Requests   Final    BOTTLES DRAWN AEROBIC AND ANAEROBIC Blood Culture adequate volume   Culture  Setup Time   Final    GRAM POSITIVE COCCI IN BOTH AEROBIC AND ANAEROBIC BOTTLES    Culture GRAM POSITIVE COCCI  Final   Report Status PENDING  Incomplete  Culture, blood (routine x 2)     Status: None (Preliminary result)   Collection Time: 11/04/17  6:20 PM  Result Value Ref Range Status   Specimen Description BLOOD RIGHT ARM  Final   Special Requests   Final    BOTTLES DRAWN AEROBIC AND ANAEROBIC Blood Culture adequate volume   Culture  Setup Time   Final    GRAM POSITIVE COCCI IN BOTH AEROBIC AND ANAEROBIC BOTTLES Organism ID to follow CRITICAL RESULT CALLED TO, READ BACK BY AND VERIFIED WITH: Patrick North PHARMD 11/05/17 1037 A JW    Culture GRAM POSITIVE COCCI  Final   Report Status PENDING  Incomplete  Blood Culture ID Panel (Reflexed)     Status: Abnormal   Collection Time: 11/04/17  6:20 PM  Result Value Ref Range Status   Enterococcus species NOT DETECTED NOT DETECTED Final   Listeria monocytogenes NOT DETECTED NOT  DETECTED Final   Staphylococcus species DETECTED (A) NOT DETECTED Final    Comment: CRITICAL RESULT CALLED TO, READ BACK BY AND VERIFIED WITH: Patrick North PHARMD 11/05/17 1037 A JW    Staphylococcus aureus DETECTED (A) NOT DETECTED Final    Comment: Methicillin (oxacillin)-resistant Staphylococcus aureus (MRSA). MRSA is predictably resistant to beta-lactam antibiotics (except ceftaroline). Preferred therapy is vancomycin unless clinically contraindicated. Patient requires contact precautions if  hospitalized. CRITICAL RESULT CALLED TO, READ BACK BY AND VERIFIED WITH: Patrick North PHARMD 11/05/17 1037 A JW    Methicillin resistance DETECTED (A) NOT DETECTED Final    Comment: CRITICAL RESULT CALLED TO, READ BACK BY AND VERIFIED WITH: Patrick North PHARMD 11/05/17 1037 A JW    Streptococcus species NOT DETECTED NOT DETECTED Final   Streptococcus agalactiae NOT DETECTED NOT DETECTED Final   Streptococcus pneumoniae NOT DETECTED NOT DETECTED Final   Streptococcus pyogenes NOT DETECTED NOT DETECTED Final   Acinetobacter baumannii NOT DETECTED NOT DETECTED Final   Enterobacteriaceae species NOT DETECTED NOT DETECTED Final   Enterobacter cloacae complex NOT DETECTED NOT DETECTED Final   Escherichia coli NOT DETECTED NOT DETECTED Final   Klebsiella oxytoca NOT DETECTED NOT DETECTED Final   Klebsiella pneumoniae NOT DETECTED NOT DETECTED Final   Proteus species NOT DETECTED NOT DETECTED Final   Serratia marcescens NOT DETECTED NOT DETECTED Final   Haemophilus influenzae NOT DETECTED NOT DETECTED Final   Neisseria meningitidis NOT DETECTED NOT DETECTED Final   Pseudomonas aeruginosa NOT DETECTED NOT DETECTED Final   Candida albicans NOT DETECTED NOT DETECTED Final   Candida glabrata NOT DETECTED NOT DETECTED Final   Candida krusei NOT DETECTED NOT DETECTED Final   Candida parapsilosis NOT DETECTED NOT DETECTED Final   Candida tropicalis NOT DETECTED NOT DETECTED Final         Radiology  Studies: Dg Chest 2 View  Result Date: 11/04/2017 CLINICAL DATA:  Fever. EXAM: CHEST  2 VIEW COMPARISON:  10/30/2017 FINDINGS: The heart size and mediastinal contours are within normal limits. Aortic atherosclerosis. Resolution of bilateral pleural effusions. Both lungs are clear. The visualized skeletal  structures are unremarkable. IMPRESSION: No active cardiopulmonary disease. Interval resolution of bilateral pleural effusions. Aortic Atherosclerosis (ICD10-I70.0). Electronically Signed   By: Signa Kell M.D.   On: 11/04/2017 19:03   Ct Renal Stone Study  Result Date: 11/04/2017 CLINICAL DATA:  84 year old female with recent discharge from hospital for congestive heart failure exacerbation. Complaining of weakness with nausea over the past couple days. Three episodes of vomiting. Left hip pain. History kidney stones 25 years ago. Prior cholecystectomy. Initial encounter. EXAM: CT ABDOMEN AND PELVIS WITHOUT CONTRAST TECHNIQUE: Multidetector CT imaging of the abdomen and pelvis was performed following the standard protocol without IV contrast. COMPARISON:  02/13/2017 renal sonogram.  No comparison CT. FINDINGS: Lower chest: Minimal scarring lung bases. Mild cardiomegaly. Mitral valve calcification. Coronary artery calcification. Hepatobiliary: Taking into account limitation by non contrast imaging, no worrisome hepatic lesion. Post cholecystectomy. No calcified common bile duct stone. Pancreas: Taking into account limitation by non contrast imaging, no pancreatic mass or inflammation. Spleen: Taking into account limitation by non contrast imaging, no splenic mass or enlargement Adrenals/Urinary Tract: No obstructing stone or hydronephrosis. 1 cm right lower pole nonobstructing stone. Right upper pole cysts measuring up to 2 cm. No adrenal lesion. Noncontrast filled imaging urinary bladder without gross abnormality. Stomach/Bowel: Moderate sigmoid diverticulosis without CT evidence of diverticulitis.  Appendix not visualized however no right lower quadrant bowel inflammatory process identified. Very small hiatal hernia may be present. No obvious gastric abnormality. Vascular/Lymphatic: Prominent vascular calcifications. No abdominal aortic aneurysm. Tortuous splenic artery. No adenopathy Reproductive: No worrisome adnexal abnormality or uterine abnormality Other: No free intraperitoneal air or bowel containing hernia. Musculoskeletal: Hip joint degenerative changes. Scoliosis lumbar spine convex right. Superimposed degenerative changes greatest on the right at the L5-S1 level. No worrisome osseous lesion. IMPRESSION: Sigmoid diverticulosis.  No bowel inflammatory process identified. Very small hiatal hernia suspected. Aortic Atherosclerosis (ICD10-I70.0). Aortic branch vessel atherosclerotic changes. No abdominal aortic aneurysm. Coronary artery calcifications. 1 cm nonobstructing right lower pole renal calculus. Right renal cysts. Post cholecystectomy. Bilateral hip joint degenerative changes. Scoliosis lumbar spine with superimposed degenerative changes greatest on the right at the L5-S1 level. Electronically Signed   By: Lacy Duverney M.D.   On: 11/04/2017 18:55        Scheduled Meds: . apixaban  2.5 mg Oral BID  . [START ON 11/06/2017] aspirin EC  81 mg Oral Q M,W,F  . atorvastatin  20 mg Oral QPM  . carvedilol  6.25 mg Oral BID WC  . levothyroxine  88 mcg Oral QAC breakfast  . multivitamin  1 tablet Oral Daily   Continuous Infusions: . cefTRIAXone (ROCEPHIN)  IV    . vancomycin 1,250 mg (11/05/17 1103)  . [START ON 11/06/2017] vancomycin       LOS: 0 days     Jacquelin Hawking, MD [facility] 11/05/2017, 12:09 PM Pager: [phone]  If 7PM-7AM, please contact night-coverage [email or web address] Password [facility] 11/05/2017, 12:09 PM

## 2017-11-05 NOTE — Evaluation (Signed)
Occupational Therapy Evaluation Patient Details Name: Chelsea NapKathleen L Beckerman MRN: [id number] DOB: 09/06/1924 Today's Date: 11/05/2017    History of Present Illness Pt is a 84 y.o. female admitted 11/04/17 with c/o fatigue and weakness; found to be febrile and UA was consistent with UTI. Of note, recently discharged 1/10 after acute CHF exacerbation. Per infectious diseases MRSA bacteremia related to recent septic thrombophlebitis caused by an IV during recent hospitalization. PMH includes CHF, chronic a-fib, hypothyroidism, anemia, CAD (s/p stenting 2017), CKD II-III.   Clinical Impression   PTA, pt was living alone and completing ADL and functional mobility at modified independent level (utilizing primarily cane and sometimes RW). Limited evaluation today as pt unable to sustain alert state long enough to follow through with ADL tasks and bed mobility. She does present with generalized B UE weakness (4/5 strength) this session impacting her ability to participate in ADL and reports feeling very fatigued. She would benefit from continued OT services while admitted to improve independence and participation in ADL and functional mobility tasks. Pt and family would very much like for pt to return home with home health therapies; however, at current functional level, recommend short-term SNF placement. Will continue to assess as pt more able to participate.     Follow Up Recommendations  SNF;Supervision/Assistance - 24 hour    Equipment Recommendations  None recommended by OT    Recommendations for Other Services       Precautions / Restrictions Precautions Precautions: Fall Restrictions Weight Bearing Restrictions: No      Mobility Bed Mobility               General bed mobility comments: Attempted to assist pt to initiate; however, pt unable to maintain alert state long enough to complete task.   Transfers                 General transfer comment: Unsafe to participate due to  lethargy    Balance                                           ADL either performed or assessed with clinical judgement   ADL Overall ADL's : Needs assistance/impaired Eating/Feeding: Minimal assistance;Bed level   Grooming: Minimal assistance;Bed level   Upper Body Bathing: Moderate assistance;Bed level   Lower Body Bathing: Total assistance   Upper Body Dressing : Moderate assistance;Bed level   Lower Body Dressing: Total assistance                 General ADL Comments: Pt very lethargic with difficulty participating in tasks today due to falling asleep. Will continue to assess when more alert.      Vision Baseline Vision/History: Macular Degeneration;Wears glasses Wears Glasses: At all times Patient Visual Report: Blurring of vision(worsening blurring of vision) Vision Assessment?: Vision impaired- to be further tested in functional context Additional Comments: Pt with macular degeneration at baseline and does not drive. Unable to read enlarged letters this session.      Perception     Praxis      Pertinent Vitals/Pain Pain Assessment: No/denies pain     Hand Dominance     Extremity/Trunk Assessment Upper Extremity Assessment Upper Extremity Assessment: Generalized weakness           Communication Communication Communication: No difficulties   Cognition Arousal/Alertness: Lethargic   Overall Cognitive Status: Impaired/Different from baseline Area of  Impairment: Attention;Awareness                   Current Attention Level: Focused       Awareness: Emergent   General Comments: Pt very lethargic on my arrival and falling asleep throughout. Willing to sit at EOB but unable to maintain alert state long enough.    General Comments       Exercises     Shoulder Instructions      Home Living Family/patient expects to be discharged to:: Private residence Living Arrangements: Alone Available Help at Discharge:  Family;Available PRN/intermittently Type of Home: House Home Access: Stairs to enter [employer] of Steps: 1 Entrance Stairs-Rails: Left Home Layout: One level     Bathroom Shower/Tub: Chief Strategy Officer: Standard     Home Equipment: Environmental consultant - 2 wheels;Cane - single point;Tub bench          Prior Functioning/Environment Level of Independence: Needs assistance  Gait / Transfers Assistance Needed: Per daughter, uses either RW or cane but primarily cane ADL's / Homemaking Assistance Needed: Per daughter able to complete with modified independence and cane.    Comments: Does not drive; daughters live nearby and drive/assist with errands        OT Problem List: Decreased strength;Decreased activity tolerance;Impaired balance (sitting and/or standing)      OT Treatment/Interventions: Self-care/ADL training;Therapeutic exercise;Energy conservation;DME and/or AE instruction;Therapeutic activities;Patient/family education;Balance training;Visual/perceptual remediation/compensation    OT Goals(Current goals can be found in the care plan section) Acute Rehab OT Goals Patient Stated Goal: Get stronger OT Goal Formulation: With patient/family Time For Goal Achievement: 11/19/17 Potential to Achieve Goals: Good ADL Goals Pt Will Perform Grooming: sitting;with modified independence Pt Will Perform Upper Body Dressing: with modified independence;sitting Pt Will Perform Lower Body Dressing: with modified independence;sit to/from stand Pt Will Transfer to Toilet: with modified independence;bedside commode(BSC over toilet) Pt Will Perform Toileting - Clothing Manipulation and hygiene: with modified independence;sit to/from stand  OT Frequency: Min 2X/week   Barriers to D/C: Decreased caregiver support          Co-evaluation              AM-PAC PT "6 Clicks" Daily Activity     Outcome Measure Help from another person eating meals?: A Little Help from  another person taking care of personal grooming?: A Little Help from another person toileting, which includes using toliet, bedpan, or urinal?: Total Help from another person bathing (including washing, rinsing, drying)?: Total Help from another person to put on and taking off regular upper body clothing?: A Little Help from another person to put on and taking off regular lower body clothing?: Total 6 Click Score: 12   End of Session Nurse Communication: Mobility status(NT: needs Pure Whick replaced)  Activity Tolerance: Patient limited by lethargy Patient left: in bed;with call bell/phone within reach;with family/visitor present  OT Visit Diagnosis: Other abnormalities of gait and mobility (R26.89);Muscle weakness (generalized) (M62.81);Low vision, both eyes (H54.2)                Time: 1610-9604 OT Time Calculation (min): 10 min Charges:  OT General Charges $OT Visit: 1 Visit OT Evaluation $OT Eval Moderate Complexity: 1 Mod G-Codes:     Doristine Section, MS OTR/L  Pager: [phone]   Aslee Such A Obryan Radu 11/05/2017, 4:05 PM

## 2017-11-06 ENCOUNTER — Inpatient Hospital Stay (HOSPITAL_COMMUNITY): Payer: Medicare Other

## 2017-11-06 DIAGNOSIS — N289 Disorder of kidney and ureter, unspecified: Secondary | ICD-10-CM

## 2017-11-06 DIAGNOSIS — N189 Chronic kidney disease, unspecified: Secondary | ICD-10-CM

## 2017-11-06 DIAGNOSIS — A4901 Methicillin susceptible Staphylococcus aureus infection, unspecified site: Secondary | ICD-10-CM

## 2017-11-06 DIAGNOSIS — I361 Nonrheumatic tricuspid (valve) insufficiency: Secondary | ICD-10-CM

## 2017-11-06 DIAGNOSIS — R531 Weakness: Secondary | ICD-10-CM

## 2017-11-06 DIAGNOSIS — Z8673 Personal history of transient ischemic attack (TIA), and cerebral infarction without residual deficits: Secondary | ICD-10-CM

## 2017-11-06 LAB — CBC WITH DIFFERENTIAL/PLATELET
Basophils Absolute: 0 10*3/uL (ref 0.0–0.1)
Basophils Relative: 0 %
Eosinophils Absolute: 0 10*3/uL (ref 0.0–0.7)
Eosinophils Relative: 0 %
HCT: 36.2 % (ref 36.0–46.0)
Hemoglobin: 11.7 g/dL — ABNORMAL LOW (ref 12.0–15.0)
Lymphocytes Relative: 10 %
Lymphs Abs: 1 10*3/uL (ref 0.7–4.0)
MCH: 31.2 pg (ref 26.0–34.0)
MCHC: 32.3 g/dL (ref 30.0–36.0)
MCV: 96.5 fL (ref 78.0–100.0)
Monocytes Absolute: 0.9 10*3/uL (ref 0.1–1.0)
Monocytes Relative: 9 %
Neutro Abs: 8.3 10*3/uL — ABNORMAL HIGH (ref 1.7–7.7)
Neutrophils Relative %: 81 %
Platelets: 112 10*3/uL — ABNORMAL LOW (ref 150–400)
RBC: 3.75 MIL/uL — ABNORMAL LOW (ref 3.87–5.11)
RDW: 13.7 % (ref 11.5–15.5)
WBC: 10.3 10*3/uL (ref 4.0–10.5)

## 2017-11-06 LAB — COMPREHENSIVE METABOLIC PANEL
ALBUMIN: 2.8 g/dL — AB (ref 3.5–5.0)
ALK PHOS: 49 U/L (ref 38–126)
ALT: 21 U/L (ref 14–54)
ANION GAP: 12 (ref 5–15)
AST: 33 U/L (ref 15–41)
BUN: 20 mg/dL (ref 6–20)
CALCIUM: 8.6 mg/dL — AB (ref 8.9–10.3)
CHLORIDE: 102 mmol/L (ref 101–111)
CO2: 24 mmol/L (ref 22–32)
Creatinine, Ser: 1.27 mg/dL — ABNORMAL HIGH (ref 0.44–1.00)
GFR calc non Af Amer: 36 mL/min — ABNORMAL LOW (ref >60)
GFR, EST AFRICAN AMERICAN: 41 mL/min — AB (ref >60)
GLUCOSE: 140 mg/dL — AB (ref 65–99)
POTASSIUM: 4.1 mmol/L (ref 3.5–5.1)
SODIUM: 138 mmol/L (ref 135–145)
Total Bilirubin: 1.1 mg/dL (ref 0.3–1.2)
Total Protein: 6.3 g/dL — ABNORMAL LOW (ref 6.5–8.1)

## 2017-11-06 LAB — PHOSPHORUS: Phosphorus: 2.9 mg/dL (ref 2.5–4.6)

## 2017-11-06 LAB — ECHOCARDIOGRAM LIMITED
Area-P 1/2: 3.01 cm2
CHL CUP TV REG PEAK VELOCITY: 238 cm/s
E decel time: 250 msec
FS: 31 % (ref 28–44)
HEIGHTINCHES: 64 in
IV/PV OW: 1.02
LA diam end sys: 43 mm
LA diam index: 2.58 cm/m2
LASIZE: 43 mm
LV PW d: 8.97 mm — AB (ref 0.6–1.1)
LVOT area: 2.54 cm2
LVOT diameter: 18 mm
MV Dec: 250
MV Peak grad: 8 mmHg
MV pk E vel: 141 m/s
MVPKAVEL: 38.3 m/s
P 1/2 time: 391 ms
P 1/2 time: 73 ms
TRMAXVEL: 238 cm/s
WEIGHTICAEL: 2165.8 [oz_av]

## 2017-11-06 LAB — CK: Total CK: 90 U/L (ref 38–234)

## 2017-11-06 LAB — MAGNESIUM: Magnesium: 2 mg/dL (ref 1.7–2.4)

## 2017-11-06 MED ORDER — DIPHENHYDRAMINE HCL 12.5 MG/5ML PO ELIX
12.5000 mg | ORAL_SOLUTION | Freq: Once | ORAL | Status: AC
  Administered 2017-11-07: 12.5 mg via ORAL
  Filled 2017-11-06: qty 5

## 2017-11-06 MED ORDER — SODIUM CHLORIDE 0.9 % IV SOLN
500.0000 mg | INTRAVENOUS | Status: DC
  Administered 2017-11-06 – 2017-11-10 (×3): 500 mg via INTRAVENOUS
  Filled 2017-11-06 (×4): qty 10

## 2017-11-06 MED ORDER — ATORVASTATIN CALCIUM 20 MG PO TABS
20.0000 mg | ORAL_TABLET | Freq: Every day | ORAL | Status: DC
  Administered 2017-11-06 – 2017-11-07 (×2): 20 mg via ORAL
  Filled 2017-11-06 (×2): qty 1

## 2017-11-06 MED ORDER — SODIUM CHLORIDE 0.9 % IV SOLN
500.0000 mg | INTRAVENOUS | Status: DC
  Filled 2017-11-06: qty 10

## 2017-11-06 NOTE — Progress Notes (Signed)
  Echocardiogram 2D Echocardiogram was attempted. Patient asked to come back later.  Chelsea Fuller G Mateya Torti 11/06/2017, 9:00 AM

## 2017-11-06 NOTE — Progress Notes (Signed)
Occupational Therapy Treatment Patient Details Name: Chelsea Fuller MRN: [id number] DOB: 06-Jun-1924 Today's Date: 11/06/2017    History of present illness Pt is a 84 y.o. female admitted 11/04/17 with c/o fatigue and weakness; found to be febrile and UA was consistent with UTI. Of note, recently discharged 1/10 after acute CHF exacerbation. Per infectious diseases MRSA bacteremia related to recent septic thrombophlebitis caused by an IV during recent hospitalization. PMH includes CHF, chronic a-fib, hypothyroidism, anemia, CAD (s/p stenting 2017), CKD II-III.   OT comments  Pt with much improved ability to participate in occupations this session as she was able to maintain awake state. Pt requiring hands on assistance for all standing ADL and ADL transfers. She was able to complete simulated toilet transfer taking a few pivotal steps from bed to chair with min assist and RW this session. Pt reporting no pain but feeling very weak during session today. She additionally required min assist to prepare for self-feeding tasks due to visual deficits. Provided adaptations to call bell to improve ability to locate nurse call and TV power buttons. Continue to recommend SNF level rehabilitation unless pt able to have 24 hour hands on assistance post-acute D/C.    Follow Up Recommendations  SNF;Supervision/Assistance - 24 hour    Equipment Recommendations  Other (comment)(TBD at next venue of care)    Recommendations for Other Services      Precautions / Restrictions Precautions Precautions: Fall Restrictions Weight Bearing Restrictions: No       Mobility Bed Mobility Overal bed mobility: Needs Assistance Bed Mobility: Rolling;Sidelying to Sit Rolling: Supervision Sidelying to sit: Min assist       General bed mobility comments: Min assist with HOB flat and cues for log roll as pt unable to assume long sitting.   Transfers Overall transfer level: Needs assistance Equipment used:  Rolling walker (2 wheeled) Transfers: Sit to/from Stand Sit to Stand: Min assist         General transfer comment: MIn assist to rise to standing and to balance.     Balance Overall balance assessment: Needs assistance Sitting-balance support: Feet supported Sitting balance-Leahy Scale: Fair     Standing balance support: Bilateral upper extremity supported;During functional activity Standing balance-Leahy Scale: Poor Standing balance comment: Relies on B UE support.                            ADL either performed or assessed with clinical judgement   ADL Overall ADL's : Needs assistance/impaired Eating/Feeding: Minimal assistance;Sitting Eating/Feeding Details (indicate cue type and reason): assist to locate items on tray secondary to visual deficits.                      Toilet Transfer: Minimal Science writer Details (indicate cue type and reason): simulated from bed to chair and taking a few pivotal steps         Functional mobility during ADLs: Rolling walker;Minimal assistance General ADL Comments: Pt with macular degeneration and difficulty locating TV and nurse call buttons on call bell. Provided raised surface over nurse call and TV power buttons for improved identification.      Vision   Additional Comments: Pt requiring assistance to locate food items on tray due to visual deficits today. See general ADL comments for adaptations made to call bell.    Perception     Praxis      Cognition Arousal/Alertness: Lethargic Behavior During Therapy: [facility] for  tasks assessed/performed Overall Cognitive Status: Impaired/Different from baseline Area of Impairment: Attention;Awareness;Following commands;Memory;Safety/judgement;Problem solving                   Current Attention Level: Selective Memory: Decreased short-term memory Following Commands: Follows multi-step commands inconsistently Safety/Judgement:  Decreased awareness of safety;Decreased awareness of deficits Awareness: Emergent Problem Solving: Decreased initiation;Requires verbal cues General Comments: Pt with decreased short-term memory noted but increased alertness this session.         Exercises     Shoulder Instructions       General Comments      Pertinent Vitals/ Pain       Pain Assessment: No/denies pain  Home Living                                          Prior Functioning/Environment              Frequency  Min 2X/week        Progress Toward Goals  OT Goals(current goals can now be found in the care plan section)  Progress towards OT goals: Progressing toward goals  Acute Rehab OT Goals Patient Stated Goal: Get stronger OT Goal Formulation: With patient/family Time For Goal Achievement: 11/19/17 Potential to Achieve Goals: Good  Plan Discharge plan remains appropriate    Co-evaluation                 AM-PAC PT "6 Clicks" Daily Activity     Outcome Measure   Help from another person eating meals?: A Little Help from another person taking care of personal grooming?: A Little Help from another person toileting, which includes using toliet, bedpan, or urinal?: A Little Help from another person bathing (including washing, rinsing, drying)?: A Little Help from another person to put on and taking off regular upper body clothing?: A Little Help from another person to put on and taking off regular lower body clothing?: A Little 6 Click Score: 18    End of Session Equipment Utilized During Treatment: Gait belt;Rolling walker  OT Visit Diagnosis: Other abnormalities of gait and mobility (R26.89);Muscle weakness (generalized) (M62.81);Low vision, both eyes (H54.2)   Activity Tolerance Patient tolerated treatment well   Patient Left in bed;with call bell/phone within reach;with family/visitor present   Nurse Communication Mobility status(pt requesting more coffee and  asked if OT can provide)        Time: 0800-0824 OT Time Calculation (min): 24 min  Charges: OT General Charges $OT Visit: 1 Visit OT Treatments $Self Care/Home Management : 23-37 mins  Doristine Sectionharity A Camisha Srey, MS OTR/L  Pager: [phone]    Chelsea Fuller 11/06/2017, 10:00 AM

## 2017-11-06 NOTE — Plan of Care (Signed)
Pt. States she is feeling better and has more energy.

## 2017-11-06 NOTE — Progress Notes (Addendum)
PROGRESS NOTE    Chelsea Fuller  ZOX:[id number] DOB: 1924/01/09 DOA: 11/04/2017 PCP: Nila Nephew, MD   Brief Narrative:  Chelsea Fuller is a 84 y.o. female with history of chronic systolic and diastolic CHF, CAD status post PCI in 2017, chronic atrial fibrillation, chronic kidney disease, hypothyroidism presents to the ER with complaints of weakness.  Patient was just recently discharged 4 days ago after being treated for acute CHF.  Patient stated since discharge patient has been getting more fatigued and weak. Was admitted and found to have an MRSA Bacteremia. ID consulted and adjusted Abx from IV Vancomycin to IV Daptomycin.  Assessment & Plan:   Principal Problem:   MRSA bacteremia Active Problems:   CKD (chronic kidney disease), stage III (HCC)   Ischemic cardiomyopathy   CAD (coronary artery disease)-DES/LAD 2017   Hypertension   Hypothyroidism, adult   Permanent atrial fibrillation (HCC)   Weakness generalized   Weakness   Septic thrombophlebitis of upper extremity  Acute MRSA Bacteremia -2/2 bottles positive. Likely contributing to patient's overall malaise.  -Started Vancomycin and transitioned to IV Daptomycin by ID -Repeat Blood Cx this AM pending; If negative will need PICC and and at least 4 weeks of IV Abx per ID -ID recommendations appreciated  -Repeat Transthoracic Echocardiogram as bellow and showed no Vegetations  -Continue to Follow ID Recc's   CKD stage III -Stable. Baseline of 1.2 -BUN/Cr went from 20/1.20 -> 20/1.27 -Avoid Nephrotoxic medications if possible -Repeat CMP in AM   Essential Hypertension -Well controlled. -Continue CoCarvedilol 6.25 mg po BID   Hypothyroidism -Check TSH in AM  -Continue Levothyroxine 88 mcg po Daily   CAD s/p DES x2 in 2017 -Continue ASA 81 mg po Daily, Atorvastatin 20 mg po daily, and Carvedilol 6.25 mg po BID   Atrial Fibrillation -Suspect is Paroxysmal -C/w Carvedilol 6.25 mg po BID  -Continue  Apixaban 2.5 mg po BID   Suspected UTI -Urinalysis showed Hazy Color Urine, Moderate Hgb, Small Leukocytes, and 6-30 WBC's  -Urine culture showed 60,000 CFU of E. Faecalis; ? Colonization -IV Ceftriaxone was discontinued by Infectious Diseases -IV Vancomycin transitioned to IV Daptomycin q48h by Infectious Disease and will likely cover E Facecalis  Elevated Troponin -POC Troponin was 0.05 and Troponin I was 0.06 and then 0.07 -Likely 2/2 to Bacteremia -Continue To Monitor -Repeat TTE done today and showed EF of 50-55% with Grade 2 DD -Denies any active CP  Lactic Acidosis -Improved. LA went from 2.09 -> 3.37 -> 1.1 -> 1.0  Chronic Systolic and Diastolic CHF -Currently not decompensated  -Repeat ECHO shows EF of 50-55% and Grade 2 DD -C/w Carvedilol 6.25 mg po BID -Monitor Strict I's/O's, Daily Weights, and SLIV -Continue to Monitor Volume Status   DVT prophylaxis: SCD's; Anticoagulated with Apixaban Code Status: DO NOT RESUSCITATE Family Communication: No family present at bedside Disposition Plan: Anticipate SNF at D/C when medically stable  Consultants:   Infectious Diseases Dr. Jonny Ruiz Campbell/ Dr. Merceda Elks   Procedures:  TRANSTHORACIC ECHOCARDIOGRAM 10/31/17 ------------------------------------------------------------------- Study Conclusions  - Left ventricle: Distal septal and apical hypokinesis Systolic   function was normal. The estimated ejection fraction was in the   range of 50% to 55%. - Aortic valve: There was mild regurgitation. - Mitral valve: Severely calcified annulus. Mildly thickened   leaflets . There was mild regurgitation. - Left atrium: The atrium was mildly dilated. - Right atrium: The atrium was mildly dilated. - Atrial septum: No defect or patent foramen ovale was identified.  REPEAT TRANSTHORACIC ECHOCARDIOGRAM 11/06/17 ------------------------------------------------------------------- Study Conclusions  - Left ventricle: The cavity  size was normal. Systolic function was   normal. The estimated ejection fraction was in the range of 50%   to 55%. Wall motion was normal; there were no regional wall   motion abnormalities. Features are consistent with a pseudonormal   left ventricular filling pattern, with concomitant abnormal   relaxation and increased filling pressure (grade 2 diastolic   dysfunction). Doppler parameters are consistent with elevated   ventricular end-diastolic filling pressure. - Aortic valve: Trileaflet; mildly thickened, mildly calcified   leaflets. There was moderate regurgitation. - Mitral valve: Calcified annulus. Moderately thickened, severely   calcified leaflets . There was mild regurgitation. - Left atrium: The atrium was moderately dilated. - Right ventricle: Systolic function was normal. - Tricuspid valve: There was mild regurgitation. - Pulmonary arteries: Systolic pressure was within the normal   range. - Inferior vena cava: The vessel was normal in size. - Pericardium, extracardiac: There was no pericardial effusion.  Impressions:  - There was no evidence of a vegetation.  Antimicrobials:  Anti-infectives (From admission, onward)   Start     Dose/Rate Route Frequency Ordered Stop   11/06/17 1230  DAPTOmycin (CUBICIN) 500 mg in sodium chloride 0.9 % IVPB  Status:  Discontinued     500 mg 220 mL/hr over 30 Minutes Intravenous Every 24 hours 11/06/17 1018 11/06/17 1033   11/06/17 1200  vancomycin (VANCOCIN) 500 mg in sodium chloride 0.9 % 100 mL IVPB  Status:  Discontinued     500 mg 100 mL/hr over 60 Minutes Intravenous Every 24 hours 11/05/17 1046 11/06/17 1018   11/06/17 1200  DAPTOmycin (CUBICIN) 500 mg in sodium chloride 0.9 % IVPB     500 mg 220 mL/hr over 30 Minutes Intravenous Every 48 hours 11/06/17 1033     11/05/17 2200  cefTRIAXone (ROCEPHIN) 1 g in dextrose 5 % 50 mL IVPB  Status:  Discontinued     1 g 100 mL/hr over 30 Minutes Intravenous Every 24 hours 11/05/17  0036 11/05/17 1446   11/05/17 1045  vancomycin (VANCOCIN) 1,250 mg in sodium chloride 0.9 % 250 mL IVPB     1,250 mg 166.7 mL/hr over 90 Minutes Intravenous  Once 11/05/17 1040 11/05/17 1233   11/04/17 2145  cefTRIAXone (ROCEPHIN) 1 g in dextrose 5 % 50 mL IVPB     1 g 100 mL/hr over 30 Minutes Intravenous  Once 11/04/17 2133 11/04/17 2251     Subjective: Seen and examined at bedside and stated she was feeling a little bit better. No nausea or vomiting. No CP or SOB.   Objective: Vitals:   11/05/17 2254 11/06/17 0406 11/06/17 0837 11/06/17 1207  BP:  140/62 (!) 125/55 119/63  Pulse:  93 78 73  Resp:  20  18  Temp: 98.9 F (37.2 C) 98.3 F (36.8 C)  (!) 97.5 F (36.4 C)  TempSrc: Oral Oral  Oral  SpO2:  95%  94%  Weight:  61.4 kg (135 lb 5.8 oz)    Height:        Intake/Output Summary (Last 24 hours) at 11/06/2017 1437 Last data filed at 11/06/2017 1420 Gross per 24 hour  Intake 1080 ml  Output 50 ml  Net 1030 ml   Filed Weights   11/05/17 0633 11/06/17 0406  Weight: 59.8 kg (131 lb 13.4 oz) 61.4 kg (135 lb 5.8 oz)   Examination: Physical Exam:  Constitutional: NAD and appears calm and comfortable  Eyes: Lids and conjunctivae normal, sclerae anicteric  ENMT: External Ears, Nose appear normal. Grossly normal hearing. Mucous membranes are moist. Neck: Appears normal, supple, no cervical masses, normal ROM, no appreciable thyromegaly, no JVD Respiratory: Diminished to auscultation bilaterally, no wheezing, rales, rhonchi or crackles. Normal respiratory effort and patient is not tachypenic. No accessory muscle use.  Cardiovascular: Irregularly Irregular, no murmurs / rubs / gallops. S1 and S2 auscultated. Mild extremity edema.  Abdomen: Soft, non-tender, non-distended. No masses palpated. No appreciable hepatosplenomegaly. Bowel sounds positive.  GU: Deferred. Musculoskeletal: No clubbing / cyanosis of digits/nails. No joint deformity upper and lower extremities.  Skin: No  rashes, lesions, ulcers on a limited skin eval. No induration; Warm and dry.  Neurologic: CN 2-12 grossly intact with no focal deficits. Romberg sign cerebellar reflexes not assessed.  Psychiatric: Normal judgment and insight. Alert and oriented x 3. Normal mood and appropriate affect.   Data Reviewed: I have personally reviewed following labs and imaging studies  CBC: Recent Labs  Lab 11/04/17 1807 11/05/17 0332 11/06/17 0824  WBC 8.1 9.6 10.3  NEUTROABS 6.6  --  8.3*  HGB 10.9* 10.7* 11.7*  HCT 34.0* 32.9* 36.2  MCV 95.0 94.8 96.5  PLT 112* 98* 112*   Basic Metabolic Panel: Recent Labs  Lab 10/31/17 0550 11/01/17 0451 11/04/17 1807 11/05/17 0332 11/06/17 0824  NA 138 138 134* 137 138  K 3.6 3.5 3.6 3.6 4.1  CL 101 99* 98* 101 102  CO2 27 29 24 23 24   GLUCOSE 96 108* 245* 160* 140*  BUN 16 15 22* 20 20  CREATININE 1.19* 1.20* 1.41* 1.20* 1.27*  CALCIUM 8.6* 9.0 8.3* 8.0* 8.6*  MG  --  2.1  --   --  2.0  PHOS  --   --   --   --  2.9   GFR: Estimated Creatinine Clearance: 24.9 mL/min (A) (by C-G formula based on SCr of 1.27 mg/dL (H)). Liver Function Tests: Recent Labs  Lab 11/04/17 1807 11/06/17 0824  AST 42* 33  ALT 24 21  ALKPHOS 46 49  BILITOT 1.1 1.1  PROT 6.2* 6.3*  ALBUMIN 2.8* 2.8*   Recent Labs  Lab 11/04/17 1807  LIPASE 55*   No results for input(s): AMMONIA in the last 168 hours. Coagulation Profile: No results for input(s): INR, PROTIME in the last 168 hours. Cardiac Enzymes: Recent Labs  Lab 10/30/17 1530 10/30/17 2043 10/31/17 0550 11/05/17 0332 11/05/17 0640 11/06/17 0824  CKTOTAL  --   --   --   --   --  90  TROPONINI 0.25* 0.24* 0.13* 0.06* 0.07*  --    BNP (last 3 results) No results for input(s): PROBNP in the last 8760 hours. HbA1C: No results for input(s): HGBA1C in the last 72 hours. CBG: Recent Labs  Lab 10/31/17 2051  GLUCAP 144*   Lipid Profile: No results for input(s): CHOL, HDL, LDLCALC, TRIG, CHOLHDL,  LDLDIRECT in the last 72 hours. Thyroid Function Tests: No results for input(s): TSH, T4TOTAL, FREET4, T3FREE, THYROIDAB in the last 72 hours. Anemia Panel: No results for input(s): VITAMINB12, FOLATE, FERRITIN, TIBC, IRON, RETICCTPCT in the last 72 hours. Sepsis Labs: Recent Labs  Lab 11/04/17 1828 11/04/17 2145 11/05/17 0332 11/05/17 0640  LATICACIDVEN 2.09* 3.37* 1.1 1.0    Recent Results (from the past 240 hour(s))  Urine culture     Status: Abnormal   Collection Time: 10/30/17  1:02 PM  Result Value Ref Range Status   Specimen Description URINE,  CLEAN CATCH  Final   Special Requests NONE  Final   Culture MULTIPLE SPECIES PRESENT, SUGGEST RECOLLECTION (A)  Final   Report Status 10/31/2017 FINAL  Final  Culture, blood (Routine X 2) w Reflex to ID Panel     Status: None   Collection Time: 10/30/17  3:30 PM  Result Value Ref Range Status   Specimen Description BLOOD LEFT ARM  Final   Special Requests   Final    BOTTLES DRAWN AEROBIC AND ANAEROBIC Blood Culture adequate volume   Culture NO GROWTH 5 DAYS  Final   Report Status 11/04/2017 FINAL  Final  Culture, blood (Routine X 2) w Reflex to ID Panel     Status: None   Collection Time: 10/30/17  4:25 PM  Result Value Ref Range Status   Specimen Description BLOOD LEFT ARM  Final   Special Requests   Final    BOTTLES DRAWN AEROBIC AND ANAEROBIC Blood Culture adequate volume   Culture NO GROWTH 5 DAYS  Final   Report Status 11/04/2017 FINAL  Final  Respiratory Panel by PCR     Status: None   Collection Time: 10/30/17  7:10 PM  Result Value Ref Range Status   Adenovirus NOT DETECTED NOT DETECTED Final   Coronavirus 229E NOT DETECTED NOT DETECTED Final   Coronavirus HKU1 NOT DETECTED NOT DETECTED Final   Coronavirus NL63 NOT DETECTED NOT DETECTED Final   Coronavirus OC43 NOT DETECTED NOT DETECTED Final   Metapneumovirus NOT DETECTED NOT DETECTED Final   Rhinovirus / Enterovirus NOT DETECTED NOT DETECTED Final   Influenza A  NOT DETECTED NOT DETECTED Final   Influenza B NOT DETECTED NOT DETECTED Final   Parainfluenza Virus 1 NOT DETECTED NOT DETECTED Final   Parainfluenza Virus 2 NOT DETECTED NOT DETECTED Final   Parainfluenza Virus 3 NOT DETECTED NOT DETECTED Final   Parainfluenza Virus 4 NOT DETECTED NOT DETECTED Final   Respiratory Syncytial Virus NOT DETECTED NOT DETECTED Final   Bordetella pertussis NOT DETECTED NOT DETECTED Final   Chlamydophila pneumoniae NOT DETECTED NOT DETECTED Final   Mycoplasma pneumoniae NOT DETECTED NOT DETECTED Final  Culture, blood (routine x 2)     Status: Abnormal (Preliminary result)   Collection Time: 11/04/17  6:08 PM  Result Value Ref Range Status   Specimen Description BLOOD LEFT UPPER ARM  Final   Special Requests   Final    BOTTLES DRAWN AEROBIC AND ANAEROBIC Blood Culture adequate volume   Culture  Setup Time   Final    GRAM POSITIVE COCCI IN BOTH AEROBIC AND ANAEROBIC BOTTLES CRITICAL RESULT CALLED TO, READ BACK BY AND VERIFIED WITH: Patrick North PHARMD 11/05/17 1037 A JW    Culture STAPHYLOCOCCUS AUREUS (A)  Final   Report Status PENDING  Incomplete  Culture, blood (routine x 2)     Status: Abnormal (Preliminary result)   Collection Time: 11/04/17  6:20 PM  Result Value Ref Range Status   Specimen Description BLOOD RIGHT ARM  Final   Special Requests   Final    BOTTLES DRAWN AEROBIC AND ANAEROBIC Blood Culture adequate volume   Culture  Setup Time   Final    GRAM POSITIVE COCCI IN BOTH AEROBIC AND ANAEROBIC BOTTLES CRITICAL RESULT CALLED TO, READ BACK BY AND VERIFIED WITH: Patrick North PHARMD 11/05/17 1037 A JW    Culture STAPHYLOCOCCUS AUREUS (A)  Final   Report Status PENDING  Incomplete  Blood Culture ID Panel (Reflexed)     Status: Abnormal  Collection Time: 11/04/17  6:20 PM  Result Value Ref Range Status   Enterococcus species NOT DETECTED NOT DETECTED Final   Listeria monocytogenes NOT DETECTED NOT DETECTED Final   Staphylococcus species  DETECTED (A) NOT DETECTED Final    Comment: CRITICAL RESULT CALLED TO, READ BACK BY AND VERIFIED WITH: Patrick North PHARMD 11/05/17 1037 A JW    Staphylococcus aureus DETECTED (A) NOT DETECTED Final    Comment: Methicillin (oxacillin)-resistant Staphylococcus aureus (MRSA). MRSA is predictably resistant to beta-lactam antibiotics (except ceftaroline). Preferred therapy is vancomycin unless clinically contraindicated. Patient requires contact precautions if  hospitalized. CRITICAL RESULT CALLED TO, READ BACK BY AND VERIFIED WITH: Patrick North PHARMD 11/05/17 1037 A JW    Methicillin resistance DETECTED (A) NOT DETECTED Final    Comment: CRITICAL RESULT CALLED TO, READ BACK BY AND VERIFIED WITH: Patrick North PHARMD 11/05/17 1037 A JW    Streptococcus species NOT DETECTED NOT DETECTED Final   Streptococcus agalactiae NOT DETECTED NOT DETECTED Final   Streptococcus pneumoniae NOT DETECTED NOT DETECTED Final   Streptococcus pyogenes NOT DETECTED NOT DETECTED Final   Acinetobacter baumannii NOT DETECTED NOT DETECTED Final   Enterobacteriaceae species NOT DETECTED NOT DETECTED Final   Enterobacter cloacae complex NOT DETECTED NOT DETECTED Final   Escherichia coli NOT DETECTED NOT DETECTED Final   Klebsiella oxytoca NOT DETECTED NOT DETECTED Final   Klebsiella pneumoniae NOT DETECTED NOT DETECTED Final   Proteus species NOT DETECTED NOT DETECTED Final   Serratia marcescens NOT DETECTED NOT DETECTED Final   Haemophilus influenzae NOT DETECTED NOT DETECTED Final   Neisseria meningitidis NOT DETECTED NOT DETECTED Final   Pseudomonas aeruginosa NOT DETECTED NOT DETECTED Final   Candida albicans NOT DETECTED NOT DETECTED Final   Candida glabrata NOT DETECTED NOT DETECTED Final   Candida krusei NOT DETECTED NOT DETECTED Final   Candida parapsilosis NOT DETECTED NOT DETECTED Final   Candida tropicalis NOT DETECTED NOT DETECTED Final  Urine culture     Status: Abnormal (Preliminary result)    Collection Time: 11/04/17  9:32 PM  Result Value Ref Range Status   Specimen Description URINE, RANDOM  Final   Special Requests NONE  Final   Culture (A)  Final    60,000 COLONIES/mL ENTEROCOCCUS FAECALIS SUSCEPTIBILITIES TO FOLLOW    Report Status PENDING  Incomplete    Radiology Studies: Dg Chest 2 View  Result Date: 11/04/2017 CLINICAL DATA:  Fever. EXAM: CHEST  2 VIEW COMPARISON:  10/30/2017 FINDINGS: The heart size and mediastinal contours are within normal limits. Aortic atherosclerosis. Resolution of bilateral pleural effusions. Both lungs are clear. The visualized skeletal structures are unremarkable. IMPRESSION: No active cardiopulmonary disease. Interval resolution of bilateral pleural effusions. Aortic Atherosclerosis (ICD10-I70.0). Electronically Signed   By: Signa Kell M.D.   On: 11/04/2017 19:03   Ct Renal Stone Study  Result Date: 11/04/2017 CLINICAL DATA:  84 year old female with recent discharge from hospital for congestive heart failure exacerbation. Complaining of weakness with nausea over the past couple days. Three episodes of vomiting. Left hip pain. History kidney stones 25 years ago. Prior cholecystectomy. Initial encounter. EXAM: CT ABDOMEN AND PELVIS WITHOUT CONTRAST TECHNIQUE: Multidetector CT imaging of the abdomen and pelvis was performed following the standard protocol without IV contrast. COMPARISON:  02/13/2017 renal sonogram.  No comparison CT. FINDINGS: Lower chest: Minimal scarring lung bases. Mild cardiomegaly. Mitral valve calcification. Coronary artery calcification. Hepatobiliary: Taking into account limitation by non contrast imaging, no worrisome hepatic lesion. Post cholecystectomy. No calcified common bile duct  stone. Pancreas: Taking into account limitation by non contrast imaging, no pancreatic mass or inflammation. Spleen: Taking into account limitation by non contrast imaging, no splenic mass or enlargement Adrenals/Urinary Tract: No obstructing  stone or hydronephrosis. 1 cm right lower pole nonobstructing stone. Right upper pole cysts measuring up to 2 cm. No adrenal lesion. Noncontrast filled imaging urinary bladder without gross abnormality. Stomach/Bowel: Moderate sigmoid diverticulosis without CT evidence of diverticulitis. Appendix not visualized however no right lower quadrant bowel inflammatory process identified. Very small hiatal hernia may be present. No obvious gastric abnormality. Vascular/Lymphatic: Prominent vascular calcifications. No abdominal aortic aneurysm. Tortuous splenic artery. No adenopathy Reproductive: No worrisome adnexal abnormality or uterine abnormality Other: No free intraperitoneal air or bowel containing hernia. Musculoskeletal: Hip joint degenerative changes. Scoliosis lumbar spine convex right. Superimposed degenerative changes greatest on the right at the L5-S1 level. No worrisome osseous lesion. IMPRESSION: Sigmoid diverticulosis.  No bowel inflammatory process identified. Very small hiatal hernia suspected. Aortic Atherosclerosis (ICD10-I70.0). Aortic branch vessel atherosclerotic changes. No abdominal aortic aneurysm. Coronary artery calcifications. 1 cm nonobstructing right lower pole renal calculus. Right renal cysts. Post cholecystectomy. Bilateral hip joint degenerative changes. Scoliosis lumbar spine with superimposed degenerative changes greatest on the right at the L5-S1 level. Electronically Signed   By: Lacy Duverney M.D.   On: 11/04/2017 18:55   Scheduled Meds: . apixaban  2.5 mg Oral BID  . aspirin EC  81 mg Oral Q M,W,F  . atorvastatin  20 mg Oral q1800  . carvedilol  6.25 mg Oral BID WC  . levothyroxine  88 mcg Oral QAC breakfast  . multivitamin  1 tablet Oral Daily   Continuous Infusions: . DAPTOmycin (CUBICIN)  IV 500 mg (11/06/17 1312)    LOS: 1 day   Merlene Laughter, DO [facility] Pager [phone]  If 7PM-7AM, please contact night-coverage [email or web address] Password  [facility] 11/06/2017, 2:37 PM

## 2017-11-06 NOTE — Progress Notes (Signed)
[facility]  [facility] will follow pt with ID team to support IV ABX at DC to home.  If patient discharges after hours, please call [phone].   Chelsea Fuller 11/06/2017, 2:59 PM

## 2017-11-06 NOTE — Plan of Care (Signed)
Pt. Bed alarm on and call light placed within reach. Pt. Needs reoriented at times.

## 2017-11-06 NOTE — Progress Notes (Signed)
[facility]   Reason for visit: Follow up on MRSA bacteremia  Interval History: did not want TTE this am, wbc wnl, feels better overall, Tmax 100.4 in last 24 hours; better appetite  Physical Exam: Constitutional:  Vitals:   11/06/17 0406 11/06/17 0837  BP: 140/62 (!) 125/55  Pulse: 93 78  Resp: 20   Temp: 98.3 F (36.8 C)   SpO2: 95%    patient appears in NAD, up in chair Eyes: anicteric Respiratory: Normal respiratory effort; CTA B Cardiovascular: RRR GI: soft, nt, nd  Review of Systems: Constitutional: negative for chills Gastrointestinal: negative for diarrhea Integument/breast: negative for rash  Lab Results  Component Value Date   WBC 10.3 11/06/2017   HGB 11.7 (L) 11/06/2017   HCT 36.2 11/06/2017   MCV 96.5 11/06/2017   PLT 112 (L) 11/06/2017    Lab Results  Component Value Date   CREATININE 1.27 (H) 11/06/2017   BUN 20 11/06/2017   NA 138 11/06/2017   K 4.1 11/06/2017   CL 102 11/06/2017   CO2 24 11/06/2017    Lab Results  Component Value Date   ALT 21 11/06/2017   AST 33 11/06/2017   ALKPHOS 49 11/06/2017     Microbiology: Recent Results (from the past 240 hour(s))  Urine culture     Status: Abnormal   Collection Time: 10/30/17  1:02 PM  Result Value Ref Range Status   Specimen Description URINE, CLEAN CATCH  Final   Special Requests NONE  Final   Culture MULTIPLE SPECIES PRESENT, SUGGEST RECOLLECTION (A)  Final   Report Status 10/31/2017 FINAL  Final  Culture, blood (Routine X 2) w Reflex to ID Panel     Status: None   Collection Time: 10/30/17  3:30 PM  Result Value Ref Range Status   Specimen Description BLOOD LEFT ARM  Final   Special Requests   Final    BOTTLES DRAWN AEROBIC AND ANAEROBIC Blood Culture adequate volume   Culture NO GROWTH 5 DAYS  Final   Report Status 11/04/2017 FINAL  Final  Culture, blood (Routine X 2) w Reflex to ID Panel     Status: None   Collection Time: 10/30/17  4:25 PM  Result  Value Ref Range Status   Specimen Description BLOOD LEFT ARM  Final   Special Requests   Final    BOTTLES DRAWN AEROBIC AND ANAEROBIC Blood Culture adequate volume   Culture NO GROWTH 5 DAYS  Final   Report Status 11/04/2017 FINAL  Final  Respiratory Panel by PCR     Status: None   Collection Time: 10/30/17  7:10 PM  Result Value Ref Range Status   Adenovirus NOT DETECTED NOT DETECTED Final   Coronavirus 229E NOT DETECTED NOT DETECTED Final   Coronavirus HKU1 NOT DETECTED NOT DETECTED Final   Coronavirus NL63 NOT DETECTED NOT DETECTED Final   Coronavirus OC43 NOT DETECTED NOT DETECTED Final   Metapneumovirus NOT DETECTED NOT DETECTED Final   Rhinovirus / Enterovirus NOT DETECTED NOT DETECTED Final   Influenza A NOT DETECTED NOT DETECTED Final   Influenza B NOT DETECTED NOT DETECTED Final   Parainfluenza Virus 1 NOT DETECTED NOT DETECTED Final   Parainfluenza Virus 2 NOT DETECTED NOT DETECTED Final   Parainfluenza Virus 3 NOT DETECTED NOT DETECTED Final   Parainfluenza Virus 4 NOT DETECTED NOT DETECTED Final   Respiratory Syncytial Virus NOT DETECTED NOT DETECTED Final   Bordetella pertussis NOT DETECTED NOT DETECTED Final  Chlamydophila pneumoniae NOT DETECTED NOT DETECTED Final   Mycoplasma pneumoniae NOT DETECTED NOT DETECTED Final  Culture, blood (routine x 2)     Status: None (Preliminary result)   Collection Time: 11/04/17  6:08 PM  Result Value Ref Range Status   Specimen Description BLOOD LEFT UPPER ARM  Final   Special Requests   Final    BOTTLES DRAWN AEROBIC AND ANAEROBIC Blood Culture adequate volume   Culture  Setup Time   Final    GRAM POSITIVE COCCI IN BOTH AEROBIC AND ANAEROBIC BOTTLES    Culture GRAM POSITIVE COCCI  Final   Report Status PENDING  Incomplete  Culture, blood (routine x 2)     Status: Abnormal (Preliminary result)   Collection Time: 11/04/17  6:20 PM  Result Value Ref Range Status   Specimen Description BLOOD RIGHT ARM  Final   Special Requests    Final    BOTTLES DRAWN AEROBIC AND ANAEROBIC Blood Culture adequate volume   Culture  Setup Time   Final    GRAM POSITIVE COCCI IN BOTH AEROBIC AND ANAEROBIC BOTTLES CRITICAL RESULT CALLED TO, READ BACK BY AND VERIFIED WITH: Patrick North PHARMD 11/05/17 1037 A JW    Culture STAPHYLOCOCCUS AUREUS (A)  Final   Report Status PENDING  Incomplete  Blood Culture ID Panel (Reflexed)     Status: Abnormal   Collection Time: 11/04/17  6:20 PM  Result Value Ref Range Status   Enterococcus species NOT DETECTED NOT DETECTED Final   Listeria monocytogenes NOT DETECTED NOT DETECTED Final   Staphylococcus species DETECTED (A) NOT DETECTED Final    Comment: CRITICAL RESULT CALLED TO, READ BACK BY AND VERIFIED WITH: Patrick North PHARMD 11/05/17 1037 A JW    Staphylococcus aureus DETECTED (A) NOT DETECTED Final    Comment: Methicillin (oxacillin)-resistant Staphylococcus aureus (MRSA). MRSA is predictably resistant to beta-lactam antibiotics (except ceftaroline). Preferred therapy is vancomycin unless clinically contraindicated. Patient requires contact precautions if  hospitalized. CRITICAL RESULT CALLED TO, READ BACK BY AND VERIFIED WITH: Patrick North PHARMD 11/05/17 1037 A JW    Methicillin resistance DETECTED (A) NOT DETECTED Final    Comment: CRITICAL RESULT CALLED TO, READ BACK BY AND VERIFIED WITH: Patrick North PHARMD 11/05/17 1037 A JW    Streptococcus species NOT DETECTED NOT DETECTED Final   Streptococcus agalactiae NOT DETECTED NOT DETECTED Final   Streptococcus pneumoniae NOT DETECTED NOT DETECTED Final   Streptococcus pyogenes NOT DETECTED NOT DETECTED Final   Acinetobacter baumannii NOT DETECTED NOT DETECTED Final   Enterobacteriaceae species NOT DETECTED NOT DETECTED Final   Enterobacter cloacae complex NOT DETECTED NOT DETECTED Final   Escherichia coli NOT DETECTED NOT DETECTED Final   Klebsiella oxytoca NOT DETECTED NOT DETECTED Final   Klebsiella pneumoniae NOT DETECTED NOT  DETECTED Final   Proteus species NOT DETECTED NOT DETECTED Final   Serratia marcescens NOT DETECTED NOT DETECTED Final   Haemophilus influenzae NOT DETECTED NOT DETECTED Final   Neisseria meningitidis NOT DETECTED NOT DETECTED Final   Pseudomonas aeruginosa NOT DETECTED NOT DETECTED Final   Candida albicans NOT DETECTED NOT DETECTED Final   Candida glabrata NOT DETECTED NOT DETECTED Final   Candida krusei NOT DETECTED NOT DETECTED Final   Candida parapsilosis NOT DETECTED NOT DETECTED Final   Candida tropicalis NOT DETECTED NOT DETECTED Final  Urine culture     Status: Abnormal (Preliminary result)   Collection Time: 11/04/17  9:32 PM  Result Value Ref Range Status   Specimen Description URINE, RANDOM  Final   Special Requests NONE  Final   Culture 60,000 COLONIES/mL UNIDENTIFIED ORGANISM (A)  Final   Report Status PENDING  Incomplete    Impression/Plan:  1. MRSA bacteremia - blood cultures repeated today; TTE not yet done, better overall. Due to acute on chronic renal insufficiency, I will change her to daptomycin and stop vancomycin.   If no concerns on TTE and repeat blood cultures remain negative, I would opt for 4 weeks total of IV daptomycin and will not consider TEE  2.  Medication monitoring - on  A statin so will need to monitor for myalgias with the addition of daptomycin and will do a baseline CK today.    3.  CAD/history of CVA - I am hesitant to stop the statin with her history so will just monitor as above.  4.  Renal insufficiency - some worsening so will change to daptomycin as above.

## 2017-11-06 NOTE — Progress Notes (Signed)
  Echocardiogram 2D Echocardiogram has been performed.  Chelsea Fuller 11/06/2017, 12:56 PM

## 2017-11-06 NOTE — Progress Notes (Signed)
Pt. Requesting med to help her sleep. On call for [facility] paged to make aware.  

## 2017-11-07 DIAGNOSIS — R7401 Elevation of levels of liver transaminase levels: Secondary | ICD-10-CM | POA: Insufficient documentation

## 2017-11-07 DIAGNOSIS — B9562 Methicillin resistant Staphylococcus aureus infection as the cause of diseases classified elsewhere: Secondary | ICD-10-CM

## 2017-11-07 DIAGNOSIS — D649 Anemia, unspecified: Secondary | ICD-10-CM

## 2017-11-07 DIAGNOSIS — R740 Nonspecific elevation of levels of transaminase and lactic acid dehydrogenase [LDH]: Secondary | ICD-10-CM

## 2017-11-07 LAB — CBC WITH DIFFERENTIAL/PLATELET
BASOS ABS: 0 10*3/uL (ref 0.0–0.1)
BASOS PCT: 0 %
EOS PCT: 2 %
Eosinophils Absolute: 0.2 10*3/uL (ref 0.0–0.7)
HCT: 31.7 % — ABNORMAL LOW (ref 36.0–46.0)
Hemoglobin: 10 g/dL — ABNORMAL LOW (ref 12.0–15.0)
Lymphocytes Relative: 14 %
Lymphs Abs: 1.1 10*3/uL (ref 0.7–4.0)
MCH: 30.3 pg (ref 26.0–34.0)
MCHC: 31.5 g/dL (ref 30.0–36.0)
MCV: 96.1 fL (ref 78.0–100.0)
MONO ABS: 1.2 10*3/uL — AB (ref 0.1–1.0)
Monocytes Relative: 15 %
Neutro Abs: 5.3 10*3/uL (ref 1.7–7.7)
Neutrophils Relative %: 69 %
Platelets: 112 10*3/uL — ABNORMAL LOW (ref 150–400)
RBC: 3.3 MIL/uL — ABNORMAL LOW (ref 3.87–5.11)
RDW: 13.8 % (ref 11.5–15.5)
WBC: 7.8 10*3/uL (ref 4.0–10.5)

## 2017-11-07 LAB — CULTURE, BLOOD (ROUTINE X 2)
SPECIAL REQUESTS: ADEQUATE
Special Requests: ADEQUATE

## 2017-11-07 LAB — MAGNESIUM: MAGNESIUM: 2 mg/dL (ref 1.7–2.4)

## 2017-11-07 LAB — URINE CULTURE: Culture: 60000 — AB

## 2017-11-07 LAB — COMPREHENSIVE METABOLIC PANEL
ALBUMIN: 2.4 g/dL — AB (ref 3.5–5.0)
ALT: 40 U/L (ref 14–54)
AST: 67 U/L — AB (ref 15–41)
Alkaline Phosphatase: 45 U/L (ref 38–126)
Anion gap: 10 (ref 5–15)
BUN: 17 mg/dL (ref 6–20)
CO2: 24 mmol/L (ref 22–32)
Calcium: 8.2 mg/dL — ABNORMAL LOW (ref 8.9–10.3)
Chloride: 102 mmol/L (ref 101–111)
Creatinine, Ser: 1.09 mg/dL — ABNORMAL HIGH (ref 0.44–1.00)
GFR calc Af Amer: 50 mL/min — ABNORMAL LOW (ref >60)
GFR, EST NON AFRICAN AMERICAN: 43 mL/min — AB (ref >60)
Glucose, Bld: 119 mg/dL — ABNORMAL HIGH (ref 65–99)
POTASSIUM: 3.9 mmol/L (ref 3.5–5.1)
Sodium: 136 mmol/L (ref 135–145)
Total Bilirubin: 0.8 mg/dL (ref 0.3–1.2)
Total Protein: 5.4 g/dL — ABNORMAL LOW (ref 6.5–8.1)

## 2017-11-07 LAB — PHOSPHORUS: Phosphorus: 2.8 mg/dL (ref 2.5–4.6)

## 2017-11-07 MED ORDER — DIPHENHYDRAMINE HCL 12.5 MG/5ML PO ELIX
12.5000 mg | ORAL_SOLUTION | Freq: Once | ORAL | Status: AC
  Administered 2017-11-07: 12.5 mg via ORAL
  Filled 2017-11-07: qty 5

## 2017-11-07 NOTE — Progress Notes (Signed)
[facility] Chelsea Fuller. [facility] as [facility] with them prior to this readmission.  If patient discharges after hours, please call [phone].   [name] 11/07/2017, 4:04 PM

## 2017-11-07 NOTE — Progress Notes (Signed)
PROGRESS NOTE    Chelsea Fuller  ZOX:[id number] DOB: 02-22-1924 DOA: 11/04/2017 PCP: Nila Nephew, MD   Brief Narrative:  Chelsea Fuller is a 84 y.o. female with history of chronic systolic and diastolic CHF, CAD status post PCI in 2017, chronic atrial fibrillation, chronic kidney disease, hypothyroidism presents to the ER with complaints of weakness.  Patient was just recently discharged 4 days ago after being treated for acute CHF.  Patient stated since discharge patient has been getting more fatigued and weak. Was admitted and found to have an MRSA Bacteremia. ID consulted and adjusted Abx from IV Vancomycin to IV Daptomycin. Patient continues to have persistently positive Blood Cx and suspected source is septic Thrombopheblitis. ID will repeat Blood Cx in AM and recommending placing PICC after 72 hours from Negative Blood Cx's for treatment of 6 weeks total.   Assessment & Plan:   Principal Problem:   MRSA bacteremia Active Problems:   Anemia   CKD (chronic kidney disease), stage III (HCC)   Ischemic cardiomyopathy   CAD (coronary artery disease)-DES/LAD 2017   Hypertension   Hypothyroidism, adult   Permanent atrial fibrillation (HCC)   Weakness generalized   Weakness   Septic thrombophlebitis of upper extremity   Elevated AST (SGOT)  Acute MRSA Bacteremia from likely suspected Septic Thrombophlebitis  -2/2 bottles positive. Likely contributing to patient's overall malaise.  -Started Vancomycin and transitioned to IV Daptomycin by ID -Repeat Blood Cx 11/06/17 AM still Positive -ID recommendations appreciated  -Repeat Transthoracic Echocardiogram as bellow and showed no Vegetations  -Continue to Follow ID Recc's; Will repeat Blood Cx in AM and will likely place PICC 72 hours after Negative Blood Cx and will need IV Abx for 6 weeks -Per ID will defer on doing TEE and will treat for 6 weeks regardless -PT Recommending SNF   CKD stage III -Stable. Baseline of  1.2 -BUN/Cr went from 20/1.20 -> 20/1.27 -> 17/1.09 -Avoid Nephrotoxic medications if possible -Repeat CMP in AM   Essential Hypertension -Controlled; BP was 144/48 -Continue w/ Carvedilol 6.25 mg po BID  -Will add IV Hydralazine if necessary   Hypothyroidism -Check TSH in AM  -Continue Levothyroxine 88 mcg po Daily   CAD s/p DES x2 in 2017 -Continue ASA 81 mg po Daily, Atorvastatin 20 mg po daily, and Carvedilol 6.25 mg po BID  -May need to hold Atorvastatin given slight elevation in AST  Atrial Fibrillation -Suspect is Paroxysmal -C/w Carvedilol 6.25 mg po BID  -Continue Apixaban 2.5 mg po BID   Suspected UTI -Urinalysis showed Hazy Color Urine, Moderate Hgb, Small Leukocytes, and 6-30 WBC's  -Urine culture showed 60,000 CFU of E. Faecalis; ? Colonization -IV Ceftriaxone was discontinued by Infectious Diseases -IV Vancomycin transitioned to IV Daptomycin q48h by Infectious Disease and will likely cover E Facecalis  Elevated Troponin -POC Troponin was 0.05 and Troponin I was 0.06 and then 0.07 -Likely 2/2 to Bacteremia -Continue To Monitor -Repeat TTE done today and showed EF of 50-55% with Grade 2 DD -Denies any active CP  Lactic Acidosis -Improved. LA went from 2.09 -> 3.37 -> 1.1 -> 1.0  Chronic Systolic and Diastolic CHF -Currently not decompensated  -Repeat ECHO shows EF of 50-55% and Grade 2 DD -C/w Carvedilol 6.25 mg po BID -Monitor Strict I's/O's, Daily Weights, and SLIV -Patient is + 1.550 L; Weight is Up 5 lbs since admission -Continue to Monitor Volume Status  Elevated AST -? Reactive -AST went from 33 -> 67 -Will consider getting RUQ U/S  and Acute Hepatitis Panel if not improving -If AST/ALT elevating will hold Atorvastatin   Normocytic Anemia -Likely in the setting of Chronic Disease -Hb/Hct went from 11.7/36.2 -> 10.0/31.7 -Continue to Monitor for S/Sx of Bleeding as patient is Anticoagulated with Apixaban -Repeat CBC in AM   DVT  prophylaxis: SCD's; Anticoagulated with Apixaban Code Status: DO NOT RESUSCITATE Family Communication: No family present at bedside Disposition Plan: Anticipate SNF at D/C when medically stable however patient may refuse  Consultants:   Infectious Diseases Dr. Jonny RuizJohn Campbell/ Dr. Merceda Elksob Comer   Procedures:  TRANSTHORACIC ECHOCARDIOGRAM 10/31/17 ------------------------------------------------------------------- Study Conclusions  - Left ventricle: Distal septal and apical hypokinesis Systolic   function was normal. The estimated ejection fraction was in the   range of 50% to 55%. - Aortic valve: There was mild regurgitation. - Mitral valve: Severely calcified annulus. Mildly thickened   leaflets . There was mild regurgitation. - Left atrium: The atrium was mildly dilated. - Right atrium: The atrium was mildly dilated. - Atrial septum: No defect or patent foramen ovale was identified.  REPEAT TRANSTHORACIC ECHOCARDIOGRAM 11/06/17 ------------------------------------------------------------------- Study Conclusions  - Left ventricle: The cavity size was normal. Systolic function was   normal. The estimated ejection fraction was in the range of 50%   to 55%. Wall motion was normal; there were no regional wall   motion abnormalities. Features are consistent with a pseudonormal   left ventricular filling pattern, with concomitant abnormal   relaxation and increased filling pressure (grade 2 diastolic   dysfunction). Doppler parameters are consistent with elevated   ventricular end-diastolic filling pressure. - Aortic valve: Trileaflet; mildly thickened, mildly calcified   leaflets. There was moderate regurgitation. - Mitral valve: Calcified annulus. Moderately thickened, severely   calcified leaflets . There was mild regurgitation. - Left atrium: The atrium was moderately dilated. - Right ventricle: Systolic function was normal. - Tricuspid valve: There was mild regurgitation. -  Pulmonary arteries: Systolic pressure was within the normal   range. - Inferior vena cava: The vessel was normal in size. - Pericardium, extracardiac: There was no pericardial effusion.  Impressions:  - There was no evidence of a vegetation.  Antimicrobials:  Anti-infectives (From admission, onward)   Start     Dose/Rate Route Frequency Ordered Stop   11/06/17 1230  DAPTOmycin (CUBICIN) 500 mg in sodium chloride 0.9 % IVPB  Status:  Discontinued     500 mg 220 mL/hr over 30 Minutes Intravenous Every 24 hours 11/06/17 1018 11/06/17 1033   11/06/17 1200  vancomycin (VANCOCIN) 500 mg in sodium chloride 0.9 % 100 mL IVPB  Status:  Discontinued     500 mg 100 mL/hr over 60 Minutes Intravenous Every 24 hours 11/05/17 1046 11/06/17 1018   11/06/17 1200  DAPTOmycin (CUBICIN) 500 mg in sodium chloride 0.9 % IVPB     500 mg 220 mL/hr over 30 Minutes Intravenous Every 48 hours 11/06/17 1033     11/05/17 2200  cefTRIAXone (ROCEPHIN) 1 g in dextrose 5 % 50 mL IVPB  Status:  Discontinued     1 g 100 mL/hr over 30 Minutes Intravenous Every 24 hours 11/05/17 0036 11/05/17 1446   11/05/17 1045  vancomycin (VANCOCIN) 1,250 mg in sodium chloride 0.9 % 250 mL IVPB     1,250 mg 166.7 mL/hr over 90 Minutes Intravenous  Once 11/05/17 1040 11/05/17 1233   11/04/17 2145  cefTRIAXone (ROCEPHIN) 1 g in dextrose 5 % 50 mL IVPB     1 g 100 mL/hr over 30 Minutes  Intravenous  Once 11/04/17 2133 11/04/17 2251     Subjective: Seen and examined at bedside and stated she felt a little stronger and was eating more. No CP/SOB or nausea or vomiting.   Objective: Vitals:   11/06/17 1945 11/07/17 0611 11/07/17 0838 11/07/17 1343  BP: (!) 140/43 (!) 151/53 (!) 144/48 (!) 138/52  Pulse: 71 65 66 68  Resp: 18 18 18 18   Temp: 97.8 F (36.6 C) 99.1 F (37.3 C)  98 F (36.7 C)  TempSrc: Oral Oral  Oral  SpO2: 98% 97% 94% 94%  Weight:  61.9 kg (136 lb 7.4 oz)    Height:        Intake/Output Summary (Last 24  hours) at 11/07/2017 1459 Last data filed at 11/07/2017 1343 Gross per 24 hour  Intake 470 ml  Output 600 ml  Net -130 ml   Filed Weights   11/05/17 0633 11/06/17 0406 11/07/17 0611  Weight: 59.8 kg (131 lb 13.4 oz) 61.4 kg (135 lb 5.8 oz) 61.9 kg (136 lb 7.4 oz)   Examination: Physical Exam:  Constitutional: WN/WD Caucasian female in NAD appears calm and comfortable sitting in chair bedside.  Eyes: Sclerae anicteric. Lids normal ENMT: External Ears and Nose appear normal. MMM Neck: Supple with no JVD Respiratory: Diminished but no appreciable wheezing/rales/rhonchi. Unlabored breathing Cardiovascular: RRR; No lower extremity edema Abdomen: Soft, NT, ND; Bowel Sounds present  GU: Deferred Musculoskeletal: No contractures; No cyanosis Skin: Warm and Dry. No appreciable rashes on a limited skin eval Neurologic: CN 2-12 grossly intact. No appreciable focal deficits  Psychiatric: Normal mood and affect. Intact judgement and insight  Data Reviewed: I have personally reviewed following labs and imaging studies  CBC: Recent Labs  Lab 11/04/17 1807 11/05/17 0332 11/06/17 0824 11/07/17 0619  WBC 8.1 9.6 10.3 7.8  NEUTROABS 6.6  --  8.3* 5.3  HGB 10.9* 10.7* 11.7* 10.0*  HCT 34.0* 32.9* 36.2 31.7*  MCV 95.0 94.8 96.5 96.1  PLT 112* 98* 112* 112*   Basic Metabolic Panel: Recent Labs  Lab 11/01/17 0451 11/04/17 1807 11/05/17 0332 11/06/17 0824 11/07/17 0619  NA 138 134* 137 138 136  K 3.5 3.6 3.6 4.1 3.9  CL 99* 98* 101 102 102  CO2 29 24 23 24 24   GLUCOSE 108* 245* 160* 140* 119*  BUN 15 22* 20 20 17   CREATININE 1.20* 1.41* 1.20* 1.27* 1.09*  CALCIUM 9.0 8.3* 8.0* 8.6* 8.2*  MG 2.1  --   --  2.0 2.0  PHOS  --   --   --  2.9 2.8   GFR: Estimated Creatinine Clearance: 29 mL/min (A) (by C-G formula based on SCr of 1.09 mg/dL (H)). Liver Function Tests: Recent Labs  Lab 11/04/17 1807 11/06/17 0824 11/07/17 0619  AST 42* 33 67*  ALT 24 21 40  ALKPHOS 46 49 45   BILITOT 1.1 1.1 0.8  PROT 6.2* 6.3* 5.4*  ALBUMIN 2.8* 2.8* 2.4*   Recent Labs  Lab 11/04/17 1807  LIPASE 55*   No results for input(s): AMMONIA in the last 168 hours. Coagulation Profile: No results for input(s): INR, PROTIME in the last 168 hours. Cardiac Enzymes: Recent Labs  Lab 11/05/17 0332 11/05/17 0640 11/06/17 0824  CKTOTAL  --   --  90  TROPONINI 0.06* 0.07*  --    BNP (last 3 results) No results for input(s): PROBNP in the last 8760 hours. HbA1C: No results for input(s): HGBA1C in the last 72 hours. CBG: Recent Labs  Lab 10/31/17 2051  GLUCAP 144*   Lipid Profile: No results for input(s): CHOL, HDL, LDLCALC, TRIG, CHOLHDL, LDLDIRECT in the last 72 hours. Thyroid Function Tests: No results for input(s): TSH, T4TOTAL, FREET4, T3FREE, THYROIDAB in the last 72 hours. Anemia Panel: No results for input(s): VITAMINB12, FOLATE, FERRITIN, TIBC, IRON, RETICCTPCT in the last 72 hours. Sepsis Labs: Recent Labs  Lab 11/04/17 1828 11/04/17 2145 11/05/17 0332 11/05/17 0640  LATICACIDVEN 2.09* 3.37* 1.1 1.0    Recent Results (from the past 240 hour(s))  Urine culture     Status: Abnormal   Collection Time: 10/30/17  1:02 PM  Result Value Ref Range Status   Specimen Description URINE, CLEAN CATCH  Final   Special Requests NONE  Final   Culture MULTIPLE SPECIES PRESENT, SUGGEST RECOLLECTION (A)  Final   Report Status 10/31/2017 FINAL  Final  Culture, blood (Routine X 2) w Reflex to ID Panel     Status: None   Collection Time: 10/30/17  3:30 PM  Result Value Ref Range Status   Specimen Description BLOOD LEFT ARM  Final   Special Requests   Final    BOTTLES DRAWN AEROBIC AND ANAEROBIC Blood Culture adequate volume   Culture NO GROWTH 5 DAYS  Final   Report Status 11/04/2017 FINAL  Final  Culture, blood (Routine X 2) w Reflex to ID Panel     Status: None   Collection Time: 10/30/17  4:25 PM  Result Value Ref Range Status   Specimen Description BLOOD LEFT ARM   Final   Special Requests   Final    BOTTLES DRAWN AEROBIC AND ANAEROBIC Blood Culture adequate volume   Culture NO GROWTH 5 DAYS  Final   Report Status 11/04/2017 FINAL  Final  Respiratory Panel by PCR     Status: None   Collection Time: 10/30/17  7:10 PM  Result Value Ref Range Status   Adenovirus NOT DETECTED NOT DETECTED Final   Coronavirus 229E NOT DETECTED NOT DETECTED Final   Coronavirus HKU1 NOT DETECTED NOT DETECTED Final   Coronavirus NL63 NOT DETECTED NOT DETECTED Final   Coronavirus OC43 NOT DETECTED NOT DETECTED Final   Metapneumovirus NOT DETECTED NOT DETECTED Final   Rhinovirus / Enterovirus NOT DETECTED NOT DETECTED Final   Influenza A NOT DETECTED NOT DETECTED Final   Influenza B NOT DETECTED NOT DETECTED Final   Parainfluenza Virus 1 NOT DETECTED NOT DETECTED Final   Parainfluenza Virus 2 NOT DETECTED NOT DETECTED Final   Parainfluenza Virus 3 NOT DETECTED NOT DETECTED Final   Parainfluenza Virus 4 NOT DETECTED NOT DETECTED Final   Respiratory Syncytial Virus NOT DETECTED NOT DETECTED Final   Bordetella pertussis NOT DETECTED NOT DETECTED Final   Chlamydophila pneumoniae NOT DETECTED NOT DETECTED Final   Mycoplasma pneumoniae NOT DETECTED NOT DETECTED Final  Culture, blood (routine x 2)     Status: Abnormal   Collection Time: 11/04/17  6:08 PM  Result Value Ref Range Status   Specimen Description BLOOD LEFT UPPER ARM  Final   Special Requests   Final    BOTTLES DRAWN AEROBIC AND ANAEROBIC Blood Culture adequate volume   Culture  Setup Time   Final    GRAM POSITIVE COCCI IN BOTH AEROBIC AND ANAEROBIC BOTTLES CRITICAL RESULT CALLED TO, READ BACK BY AND VERIFIED WITH: Patrick North PHARMD 11/05/17 1037 A JW    Culture (A)  Final    STAPHYLOCOCCUS AUREUS SUSCEPTIBILITIES PERFORMED ON PREVIOUS CULTURE WITHIN THE LAST 5 DAYS.  Report Status 11/07/2017 FINAL  Final  Culture, blood (routine x 2)     Status: Abnormal   Collection Time: 11/04/17  6:20 PM  Result  Value Ref Range Status   Specimen Description BLOOD RIGHT ARM  Final   Special Requests   Final    BOTTLES DRAWN AEROBIC AND ANAEROBIC Blood Culture adequate volume   Culture  Setup Time   Final    GRAM POSITIVE COCCI IN BOTH AEROBIC AND ANAEROBIC BOTTLES CRITICAL RESULT CALLED TO, READ BACK BY AND VERIFIED WITH: Patrick North PHARMD 11/05/17 1037 A JW    Culture METHICILLIN RESISTANT STAPHYLOCOCCUS AUREUS (A)  Final   Report Status 11/07/2017 FINAL  Final   Organism ID, Bacteria METHICILLIN RESISTANT STAPHYLOCOCCUS AUREUS  Final      Susceptibility   Methicillin resistant staphylococcus aureus - MIC*    CIPROFLOXACIN <=0.5 SENSITIVE Sensitive     ERYTHROMYCIN >=8 RESISTANT Resistant     GENTAMICIN <=0.5 SENSITIVE Sensitive     OXACILLIN >=4 RESISTANT Resistant     TETRACYCLINE <=1 SENSITIVE Sensitive     VANCOMYCIN 1 SENSITIVE Sensitive     TRIMETH/SULFA <=10 SENSITIVE Sensitive     CLINDAMYCIN <=0.25 SENSITIVE Sensitive     RIFAMPIN <=0.5 SENSITIVE Sensitive     Inducible Clindamycin NEGATIVE Sensitive     * METHICILLIN RESISTANT STAPHYLOCOCCUS AUREUS  Blood Culture ID Panel (Reflexed)     Status: Abnormal   Collection Time: 11/04/17  6:20 PM  Result Value Ref Range Status   Enterococcus species NOT DETECTED NOT DETECTED Final   Listeria monocytogenes NOT DETECTED NOT DETECTED Final   Staphylococcus species DETECTED (A) NOT DETECTED Final    Comment: CRITICAL RESULT CALLED TO, READ BACK BY AND VERIFIED WITH: Patrick North PHARMD 11/05/17 1037 A JW    Staphylococcus aureus DETECTED (A) NOT DETECTED Final    Comment: Methicillin (oxacillin)-resistant Staphylococcus aureus (MRSA). MRSA is predictably resistant to beta-lactam antibiotics (except ceftaroline). Preferred therapy is vancomycin unless clinically contraindicated. Patient requires contact precautions if  hospitalized. CRITICAL RESULT CALLED TO, READ BACK BY AND VERIFIED WITH: Patrick North PHARMD 11/05/17 1037 A JW     Methicillin resistance DETECTED (A) NOT DETECTED Final    Comment: CRITICAL RESULT CALLED TO, READ BACK BY AND VERIFIED WITH: Patrick North PHARMD 11/05/17 1037 A JW    Streptococcus species NOT DETECTED NOT DETECTED Final   Streptococcus agalactiae NOT DETECTED NOT DETECTED Final   Streptococcus pneumoniae NOT DETECTED NOT DETECTED Final   Streptococcus pyogenes NOT DETECTED NOT DETECTED Final   Acinetobacter baumannii NOT DETECTED NOT DETECTED Final   Enterobacteriaceae species NOT DETECTED NOT DETECTED Final   Enterobacter cloacae complex NOT DETECTED NOT DETECTED Final   Escherichia coli NOT DETECTED NOT DETECTED Final   Klebsiella oxytoca NOT DETECTED NOT DETECTED Final   Klebsiella pneumoniae NOT DETECTED NOT DETECTED Final   Proteus species NOT DETECTED NOT DETECTED Final   Serratia marcescens NOT DETECTED NOT DETECTED Final   Haemophilus influenzae NOT DETECTED NOT DETECTED Final   Neisseria meningitidis NOT DETECTED NOT DETECTED Final   Pseudomonas aeruginosa NOT DETECTED NOT DETECTED Final   Candida albicans NOT DETECTED NOT DETECTED Final   Candida glabrata NOT DETECTED NOT DETECTED Final   Candida krusei NOT DETECTED NOT DETECTED Final   Candida parapsilosis NOT DETECTED NOT DETECTED Final   Candida tropicalis NOT DETECTED NOT DETECTED Final  Urine culture     Status: Abnormal   Collection Time: 11/04/17  9:32 PM  Result Value Ref Range  Status   Specimen Description URINE, RANDOM  Final   Special Requests NONE  Final   Culture 60,000 COLONIES/mL ENTEROCOCCUS FAECALIS (A)  Final   Report Status 11/07/2017 FINAL  Final   Organism ID, Bacteria ENTEROCOCCUS FAECALIS (A)  Final      Susceptibility   Enterococcus faecalis - MIC*    AMPICILLIN <=2 SENSITIVE Sensitive     LEVOFLOXACIN 1 SENSITIVE Sensitive     NITROFURANTOIN <=16 SENSITIVE Sensitive     VANCOMYCIN 1 SENSITIVE Sensitive     * 60,000 COLONIES/mL ENTEROCOCCUS FAECALIS  Culture, blood (routine x 2)     Status:  None (Preliminary result)   Collection Time: 11/06/17  8:35 AM  Result Value Ref Range Status   Specimen Description BLOOD LEFT HAND  Final   Special Requests IN PEDIATRIC BOTTLE Blood Culture adequate volume  Final   Culture  Setup Time   Final    GRAM POSITIVE COCCI IN PEDIATRIC BOTTLE CRITICAL VALUE NOTED.  VALUE IS CONSISTENT WITH PREVIOUSLY REPORTED AND CALLED VALUE.    Culture NO GROWTH < 24 HOURS  Final   Report Status PENDING  Incomplete  Culture, blood (routine x 2)     Status: None (Preliminary result)   Collection Time: 11/06/17  8:43 AM  Result Value Ref Range Status   Specimen Description BLOOD LEFT ARM  Final   Special Requests IN PEDIATRIC BOTTLE Blood Culture adequate volume  Final   Culture  Setup Time   Final    GRAM POSITIVE COCCI IN PEDIATRIC BOTTLE CRITICAL VALUE NOTED.  VALUE IS CONSISTENT WITH PREVIOUSLY REPORTED AND CALLED VALUE.    Culture NO GROWTH < 24 HOURS  Final   Report Status PENDING  Incomplete    Radiology Studies: No results found. Scheduled Meds: . apixaban  2.5 mg Oral BID  . aspirin EC  81 mg Oral Q M,W,F  . atorvastatin  20 mg Oral q1800  . carvedilol  6.25 mg Oral BID WC  . levothyroxine  88 mcg Oral QAC breakfast  . multivitamin  1 tablet Oral Daily   Continuous Infusions: . DAPTOmycin (CUBICIN)  IV Stopped (11/06/17 1342)    LOS: 2 days   Merlene Laughter, DO [facility] Pager [phone]  If 7PM-7AM, please contact night-coverage [email or web address] Password [email or web address] 11/07/2017, 2:59 PM

## 2017-11-07 NOTE — Progress Notes (Signed)
[facility]   Reason for visit: Follow up on MRSA bacteremia  Interval History: she continues to feel better; no fever, no chills; wbc wnl.  Eating more.  TTE without any concerns on the valves Day 2 daptomycin Day 4 total antibiotics  Physical Exam: Constitutional:  Vitals:   11/07/17 0611 11/07/17 0838  BP: (!) 151/53 (!) 144/48  Pulse: 65 66  Resp: 18 18  Temp: 99.1 F (37.3 C)   SpO2: 97% 94%   patient appears in NAD, up in chair Eyes: anicteric HENT: no thrush Respiratory: Normal respiratory effort; CTA B Cardiovascular: RRR GI: soft, nt, nd  Review of Systems: Constitutional: negative for anorexia Gastrointestinal: negative for diarrhea Integument/breast: negative for rash Musculoskeletal: negative for myalgias and arthralgias  Lab Results  Component Value Date   WBC 7.8 11/07/2017   HGB 10.0 (L) 11/07/2017   HCT 31.7 (L) 11/07/2017   MCV 96.1 11/07/2017   PLT 112 (L) 11/07/2017    Lab Results  Component Value Date   CREATININE 1.09 (H) 11/07/2017   BUN 17 11/07/2017   NA 136 11/07/2017   K 3.9 11/07/2017   CL 102 11/07/2017   CO2 24 11/07/2017    Lab Results  Component Value Date   ALT 40 11/07/2017   AST 67 (H) 11/07/2017   ALKPHOS 45 11/07/2017     Microbiology: Recent Results (from the past 240 hour(s))  Urine culture     Status: Abnormal   Collection Time: 10/30/17  1:02 PM  Result Value Ref Range Status   Specimen Description URINE, CLEAN CATCH  Final   Special Requests NONE  Final   Culture MULTIPLE SPECIES PRESENT, SUGGEST RECOLLECTION (A)  Final   Report Status 10/31/2017 FINAL  Final  Culture, blood (Routine X 2) w Reflex to ID Panel     Status: None   Collection Time: 10/30/17  3:30 PM  Result Value Ref Range Status   Specimen Description BLOOD LEFT ARM  Final   Special Requests   Final    BOTTLES DRAWN AEROBIC AND ANAEROBIC Blood Culture adequate volume   Culture NO GROWTH 5 DAYS  Final   Report  Status 11/04/2017 FINAL  Final  Culture, blood (Routine X 2) w Reflex to ID Panel     Status: None   Collection Time: 10/30/17  4:25 PM  Result Value Ref Range Status   Specimen Description BLOOD LEFT ARM  Final   Special Requests   Final    BOTTLES DRAWN AEROBIC AND ANAEROBIC Blood Culture adequate volume   Culture NO GROWTH 5 DAYS  Final   Report Status 11/04/2017 FINAL  Final  Respiratory Panel by PCR     Status: None   Collection Time: 10/30/17  7:10 PM  Result Value Ref Range Status   Adenovirus NOT DETECTED NOT DETECTED Final   Coronavirus 229E NOT DETECTED NOT DETECTED Final   Coronavirus HKU1 NOT DETECTED NOT DETECTED Final   Coronavirus NL63 NOT DETECTED NOT DETECTED Final   Coronavirus OC43 NOT DETECTED NOT DETECTED Final   Metapneumovirus NOT DETECTED NOT DETECTED Final   Rhinovirus / Enterovirus NOT DETECTED NOT DETECTED Final   Influenza A NOT DETECTED NOT DETECTED Final   Influenza B NOT DETECTED NOT DETECTED Final   Parainfluenza Virus 1 NOT DETECTED NOT DETECTED Final   Parainfluenza Virus 2 NOT DETECTED NOT DETECTED Final   Parainfluenza Virus 3 NOT DETECTED NOT DETECTED Final   Parainfluenza Virus 4 NOT DETECTED NOT  DETECTED Final   Respiratory Syncytial Virus NOT DETECTED NOT DETECTED Final   Bordetella pertussis NOT DETECTED NOT DETECTED Final   Chlamydophila pneumoniae NOT DETECTED NOT DETECTED Final   Mycoplasma pneumoniae NOT DETECTED NOT DETECTED Final  Culture, blood (routine x 2)     Status: Abnormal   Collection Time: 11/04/17  6:08 PM  Result Value Ref Range Status   Specimen Description BLOOD LEFT UPPER ARM  Final   Special Requests   Final    BOTTLES DRAWN AEROBIC AND ANAEROBIC Blood Culture adequate volume   Culture  Setup Time   Final    GRAM POSITIVE COCCI IN BOTH AEROBIC AND ANAEROBIC BOTTLES CRITICAL RESULT CALLED TO, READ BACK BY AND VERIFIED WITH: Patrick North PHARMD 11/05/17 1037 A JW    Culture (A)  Final    STAPHYLOCOCCUS  AUREUS SUSCEPTIBILITIES PERFORMED ON PREVIOUS CULTURE WITHIN THE LAST 5 DAYS.    Report Status 11/07/2017 FINAL  Final  Culture, blood (routine x 2)     Status: Abnormal   Collection Time: 11/04/17  6:20 PM  Result Value Ref Range Status   Specimen Description BLOOD RIGHT ARM  Final   Special Requests   Final    BOTTLES DRAWN AEROBIC AND ANAEROBIC Blood Culture adequate volume   Culture  Setup Time   Final    GRAM POSITIVE COCCI IN BOTH AEROBIC AND ANAEROBIC BOTTLES CRITICAL RESULT CALLED TO, READ BACK BY AND VERIFIED WITH: Patrick North PHARMD 11/05/17 1037 A JW    Culture METHICILLIN RESISTANT STAPHYLOCOCCUS AUREUS (A)  Final   Report Status 11/07/2017 FINAL  Final   Organism ID, Bacteria METHICILLIN RESISTANT STAPHYLOCOCCUS AUREUS  Final      Susceptibility   Methicillin resistant staphylococcus aureus - MIC*    CIPROFLOXACIN <=0.5 SENSITIVE Sensitive     ERYTHROMYCIN >=8 RESISTANT Resistant     GENTAMICIN <=0.5 SENSITIVE Sensitive     OXACILLIN >=4 RESISTANT Resistant     TETRACYCLINE <=1 SENSITIVE Sensitive     VANCOMYCIN 1 SENSITIVE Sensitive     TRIMETH/SULFA <=10 SENSITIVE Sensitive     CLINDAMYCIN <=0.25 SENSITIVE Sensitive     RIFAMPIN <=0.5 SENSITIVE Sensitive     Inducible Clindamycin NEGATIVE Sensitive     * METHICILLIN RESISTANT STAPHYLOCOCCUS AUREUS  Blood Culture ID Panel (Reflexed)     Status: Abnormal   Collection Time: 11/04/17  6:20 PM  Result Value Ref Range Status   Enterococcus species NOT DETECTED NOT DETECTED Final   Listeria monocytogenes NOT DETECTED NOT DETECTED Final   Staphylococcus species DETECTED (A) NOT DETECTED Final    Comment: CRITICAL RESULT CALLED TO, READ BACK BY AND VERIFIED WITH: Patrick North PHARMD 11/05/17 1037 A JW    Staphylococcus aureus DETECTED (A) NOT DETECTED Final    Comment: Methicillin (oxacillin)-resistant Staphylococcus aureus (MRSA). MRSA is predictably resistant to beta-lactam antibiotics (except ceftaroline). Preferred  therapy is vancomycin unless clinically contraindicated. Patient requires contact precautions if  hospitalized. CRITICAL RESULT CALLED TO, READ BACK BY AND VERIFIED WITH: Patrick North PHARMD 11/05/17 1037 A JW    Methicillin resistance DETECTED (A) NOT DETECTED Final    Comment: CRITICAL RESULT CALLED TO, READ BACK BY AND VERIFIED WITH: Patrick North PHARMD 11/05/17 1037 A JW    Streptococcus species NOT DETECTED NOT DETECTED Final   Streptococcus agalactiae NOT DETECTED NOT DETECTED Final   Streptococcus pneumoniae NOT DETECTED NOT DETECTED Final   Streptococcus pyogenes NOT DETECTED NOT DETECTED Final   Acinetobacter baumannii NOT DETECTED NOT DETECTED Final  Enterobacteriaceae species NOT DETECTED NOT DETECTED Final   Enterobacter cloacae complex NOT DETECTED NOT DETECTED Final   Escherichia coli NOT DETECTED NOT DETECTED Final   Klebsiella oxytoca NOT DETECTED NOT DETECTED Final   Klebsiella pneumoniae NOT DETECTED NOT DETECTED Final   Proteus species NOT DETECTED NOT DETECTED Final   Serratia marcescens NOT DETECTED NOT DETECTED Final   Haemophilus influenzae NOT DETECTED NOT DETECTED Final   Neisseria meningitidis NOT DETECTED NOT DETECTED Final   Pseudomonas aeruginosa NOT DETECTED NOT DETECTED Final   Candida albicans NOT DETECTED NOT DETECTED Final   Candida glabrata NOT DETECTED NOT DETECTED Final   Candida krusei NOT DETECTED NOT DETECTED Final   Candida parapsilosis NOT DETECTED NOT DETECTED Final   Candida tropicalis NOT DETECTED NOT DETECTED Final  Urine culture     Status: Abnormal   Collection Time: 11/04/17  9:32 PM  Result Value Ref Range Status   Specimen Description URINE, RANDOM  Final   Special Requests NONE  Final   Culture 60,000 COLONIES/mL ENTEROCOCCUS FAECALIS (A)  Final   Report Status 11/07/2017 FINAL  Final   Organism ID, Bacteria ENTEROCOCCUS FAECALIS (A)  Final      Susceptibility   Enterococcus faecalis - MIC*    AMPICILLIN <=2 SENSITIVE  Sensitive     LEVOFLOXACIN 1 SENSITIVE Sensitive     NITROFURANTOIN <=16 SENSITIVE Sensitive     VANCOMYCIN 1 SENSITIVE Sensitive     * 60,000 COLONIES/mL ENTEROCOCCUS FAECALIS  Culture, blood (routine x 2)     Status: None (Preliminary result)   Collection Time: 11/06/17  8:35 AM  Result Value Ref Range Status   Specimen Description BLOOD LEFT HAND  Final   Special Requests IN PEDIATRIC BOTTLE Blood Culture adequate volume  Final   Culture  Setup Time   Final    GRAM POSITIVE COCCI IN PEDIATRIC BOTTLE CRITICAL VALUE NOTED.  VALUE IS CONSISTENT WITH PREVIOUSLY REPORTED AND CALLED VALUE.    Culture NO GROWTH < 24 HOURS  Final   Report Status PENDING  Incomplete  Culture, blood (routine x 2)     Status: None (Preliminary result)   Collection Time: 11/06/17  8:43 AM  Result Value Ref Range Status   Specimen Description BLOOD LEFT ARM  Final   Special Requests IN PEDIATRIC BOTTLE Blood Culture adequate volume  Final   Culture  Setup Time   Final    GRAM POSITIVE COCCI IN PEDIATRIC BOTTLE CRITICAL VALUE NOTED.  VALUE IS CONSISTENT WITH PREVIOUSLY REPORTED AND CALLED VALUE.    Culture NO GROWTH < 24 HOURS  Final   Report Status PENDING  Incomplete    Impression/Plan:  1. MRSA bacteremia - persistently positive blood cultures. Septic thrombophlebitis.  Likely source of persistent bacteremia.   I will repeat the blood cultures in the am She will need to have negative blood cultures before proceeding to picc line She will need 6 weeks of treatment with concern for ongoing bacteremia and thrombophlebitis. TTE ok, I will defer on doing TEE and opt to treat 6 weeks regardless.   2.  Access - as above, still with active bacteremia.  Recheck cultures and consider placing picc line after 72 hours if remains negative.    3.  Renal insufficiency -  Creat a bit improved.  Will use daptomycin as above to avoid potential nephrotoxicity  4.  Medication monitoring - baseline CK ok.  Will check  weekly.

## 2017-11-07 NOTE — Progress Notes (Signed)
Physical Therapy Treatment Patient Details Name: Chelsea Fuller MRN: [id number] DOB: [birth year] Today's Date: 11/07/2017    History of Present Illness Pt is a 84 y.o. female admitted 11/04/17 with c/o fatigue and weakness; found to be febrile and UA was consistent with UTI. Of note, recently discharged 1/10 after acute CHF exacerbation. Per infectious diseases MRSA bacteremia related to recent septic thrombophlebitis caused by an IV during recent hospitalization. PMH includes CHF, chronic a-fib, hypothyroidism, anemia, CAD (s/p stenting 2017), CKD II-III.   PT Comments    Pt progressing with mobility, remains limited by generalized fatigue. Able to ambulate short distance with RW and intermittent minA to correct balance. Performed ADL tasks with intermittent assistance due to decreased vision. Continue to recommend SNF-level therapies at d/c to maximize functional mobility and independence. Will follow acutely.   Follow Up Recommendations  SNF;Supervision/Assistance - 24 hour     Equipment Recommendations  None recommended by PT    Recommendations for Other Services       Precautions / Restrictions Precautions Precautions: Fall Restrictions Weight Bearing Restrictions: No    Mobility  Bed Mobility               General bed mobility comments: Received sitting in chair  Transfers Overall transfer level: Needs assistance Equipment used: Rolling walker (2 wheeled) Transfers: Sit to/from Stand Sit to Stand: Min assist;Min guard         General transfer comment: Stood 6x total with RW. Initial minA for balance, progressing to min guard. Good technique and hand placement. Increased time required with increasing fatigue  Ambulation/Gait Ambulation/Gait assistance: Min assist Ambulation Distance (Feet): 50 Feet Assistive device: Rolling walker (2 wheeled) Gait Pattern/deviations: Step-to pattern;Shuffle;Trunk flexed Gait velocity: Decreased Gait velocity  interpretation: <1.8 ft/sec, indicative of risk for recurrent falls General Gait Details: Slow, unsteady amb with RW; 1x seated rest break before ambulating to sink to brush teeth while standing. Declined further distance secondary to fatigue   Stairs            Wheelchair Mobility    Modified Rankin (Stroke Patients Only)       Balance Overall balance assessment: Needs assistance Sitting-balance support: Feet supported Sitting balance-Leahy Scale: Fair     Standing balance support: Bilateral upper extremity supported;During functional activity Standing balance-Leahy Scale: Poor Standing balance comment: Relies on B UE support.                             Cognition Arousal/Alertness: Awake/alert Behavior During Therapy: Flat affect Overall Cognitive Status: No family/caregiver present to determine baseline cognitive functioning Area of Impairment: Attention;Awareness;Following commands;Safety/judgement;Problem solving                   Current Attention Level: Selective   Following Commands: Follows multi-step commands inconsistently Safety/Judgement: Decreased awareness of safety;Decreased awareness of deficits Awareness: Emergent Problem Solving: Decreased initiation;Requires verbal cues        Exercises      General Comments General comments (skin integrity, edema, etc.): SpO2 95% on RA      Pertinent Vitals/Pain Pain Assessment: No/denies pain    Home Living                      Prior Function            PT Goals (current goals can now be found in the care plan section) Acute Rehab PT Goals Patient Stated Goal: Get  stronger PT Goal Formulation: With patient Time For Goal Achievement: 11/19/17 Potential to Achieve Goals: Good Progress towards PT goals: Progressing toward goals    Frequency    Min 2X/week      PT Plan Current plan remains appropriate    Co-evaluation              AM-PAC PT "6 Clicks"  Daily Activity  Outcome Measure  Difficulty turning over in bed (including adjusting bedclothes, sheets and blankets)?: None Difficulty moving from lying on back to sitting on the side of the bed? : A Little Difficulty sitting down on and standing up from a chair with arms (e.g., wheelchair, bedside commode, etc,.)?: Unable Help needed moving to and from a bed to chair (including a wheelchair)?: A Little Help needed walking in hospital room?: A Little Help needed climbing 3-5 steps with a railing? : A Lot 6 Click Score: 16    End of Session Equipment Utilized During Treatment: Gait belt Activity Tolerance: Patient tolerated treatment well;Patient limited by fatigue Patient left: in chair;with call bell/phone within reach Nurse Communication: Mobility status PT Visit Diagnosis: Other abnormalities of gait and mobility (R26.89);Muscle weakness (generalized) (M62.81);Repeated falls (R29.6)     Time: [id number] PT Time Calculation (min) (ACUTE ONLY): 24 min  Charges:  $Gait Training: 8-22 mins $Therapeutic Activity: 8-22 mins                    G Codes:      Ina HomesJaclyn Jonesha Tsuchiya, PT, DPT Acute Rehab Services  Pager: [phone]  Malachy ChamberJaclyn L Emmagrace Runkel 11/07/2017, 9:47 AM

## 2017-11-08 DIAGNOSIS — R7989 Other specified abnormal findings of blood chemistry: Secondary | ICD-10-CM

## 2017-11-08 DIAGNOSIS — D649 Anemia, unspecified: Secondary | ICD-10-CM

## 2017-11-08 DIAGNOSIS — R945 Abnormal results of liver function studies: Secondary | ICD-10-CM

## 2017-11-08 DIAGNOSIS — I809 Phlebitis and thrombophlebitis of unspecified site: Secondary | ICD-10-CM

## 2017-11-08 DIAGNOSIS — D696 Thrombocytopenia, unspecified: Secondary | ICD-10-CM

## 2017-11-08 LAB — CBC WITH DIFFERENTIAL/PLATELET
Basophils Absolute: 0.1 10*3/uL (ref 0.0–0.1)
Basophils Relative: 1 %
EOS ABS: 0.3 10*3/uL (ref 0.0–0.7)
Eosinophils Relative: 5 %
HCT: 32.9 % — ABNORMAL LOW (ref 36.0–46.0)
Hemoglobin: 10.6 g/dL — ABNORMAL LOW (ref 12.0–15.0)
LYMPHS ABS: 1.7 10*3/uL (ref 0.7–4.0)
Lymphocytes Relative: 26 %
MCH: 30.8 pg (ref 26.0–34.0)
MCHC: 32.2 g/dL (ref 30.0–36.0)
MCV: 95.6 fL (ref 78.0–100.0)
MONO ABS: 0.7 10*3/uL (ref 0.1–1.0)
Monocytes Relative: 11 %
Neutro Abs: 3.7 10*3/uL (ref 1.7–7.7)
Neutrophils Relative %: 57 %
PLATELETS: 145 10*3/uL — AB (ref 150–400)
RBC: 3.44 MIL/uL — ABNORMAL LOW (ref 3.87–5.11)
RDW: 13.9 % (ref 11.5–15.5)
WBC: 6.5 10*3/uL (ref 4.0–10.5)

## 2017-11-08 LAB — COMPREHENSIVE METABOLIC PANEL
ALT: 57 U/L — ABNORMAL HIGH (ref 14–54)
ANION GAP: 10 (ref 5–15)
AST: 80 U/L — ABNORMAL HIGH (ref 15–41)
Albumin: 2.5 g/dL — ABNORMAL LOW (ref 3.5–5.0)
Alkaline Phosphatase: 48 U/L (ref 38–126)
BUN: 17 mg/dL (ref 6–20)
CHLORIDE: 103 mmol/L (ref 101–111)
CO2: 25 mmol/L (ref 22–32)
CREATININE: 1.12 mg/dL — AB (ref 0.44–1.00)
Calcium: 8.6 mg/dL — ABNORMAL LOW (ref 8.9–10.3)
GFR calc non Af Amer: 42 mL/min — ABNORMAL LOW (ref >60)
GFR, EST AFRICAN AMERICAN: 48 mL/min — AB (ref >60)
Glucose, Bld: 121 mg/dL — ABNORMAL HIGH (ref 65–99)
POTASSIUM: 3.6 mmol/L (ref 3.5–5.1)
SODIUM: 138 mmol/L (ref 135–145)
Total Bilirubin: 0.9 mg/dL (ref 0.3–1.2)
Total Protein: 5.7 g/dL — ABNORMAL LOW (ref 6.5–8.1)

## 2017-11-08 LAB — PHOSPHORUS: PHOSPHORUS: 3.6 mg/dL (ref 2.5–4.6)

## 2017-11-08 LAB — CULTURE, BLOOD (ROUTINE X 2)
SPECIAL REQUESTS: ADEQUATE
Special Requests: ADEQUATE

## 2017-11-08 LAB — MAGNESIUM: MAGNESIUM: 2 mg/dL (ref 1.7–2.4)

## 2017-11-08 LAB — TSH: TSH: 3.397 u[IU]/mL (ref 0.350–4.500)

## 2017-11-08 MED ORDER — FUROSEMIDE 40 MG PO TABS
40.0000 mg | ORAL_TABLET | Freq: Every day | ORAL | Status: DC
  Administered 2017-11-08 – 2017-11-11 (×4): 40 mg via ORAL
  Filled 2017-11-08 (×4): qty 1

## 2017-11-08 NOTE — Progress Notes (Signed)
PROGRESS NOTE    Chelsea KIRLEY  Fuller:[id number] DOB: 12-02-1923 DOA: 11/04/2017 PCP: Nila Nephew, MD   Brief Narrative:  Chelsea Fuller is a 84 y.o. female with history of chronic systolic and diastolic CHF, CAD status post PCI in 2017, chronic atrial fibrillation, chronic kidney disease, hypothyroidism presents to the ER with complaints of weakness.  Patient was just recently discharged 4 days ago after being treated for acute CHF.  Patient stated since discharge patient has been getting more fatigued and weak. Was admitted and found to have an MRSA Bacteremia. ID consulted and adjusted Abx from IV Vancomycin to IV Daptomycin. Patient continues to have persistently positive Blood Cx and suspected source is septic Thrombopheblitis. ID repeated Blood Cx this AM and recommending placing PICC after 72 hours from Negative Blood Cx's for treatment of 6 weeks total after Negative Cx's.   Assessment & Plan:   Principal Problem:   MRSA bacteremia Active Problems:   Anemia   CKD (chronic kidney disease), stage III (HCC)   Ischemic cardiomyopathy   CAD (coronary artery disease)-DES/LAD 2017   Hypertension   Hypothyroidism, adult   Permanent atrial fibrillation (HCC)   Weakness generalized   Weakness   Septic thrombophlebitis of upper extremity   Abnormal LFTs   Thrombocytopenia (HCC)   Normocytic anemia  Acute MRSA Bacteremia from likely suspected Septic Thrombophlebitis of the Left Arm -2/2 bottles positive. Likely contributing to patient's overall malaise.  -Started Vancomycin and transitioned to IV Daptomycin by ID -Repeat Blood Cx 11/06/17 AM still Positive -ID recommendations appreciated  -Repeat Transthoracic Echocardiogram as below and showed no Vegetations  -Continue to Follow ID Recc's; Blood Cx's repeated this AM and will likely place PICC 72 hours after Negative Blood Cx and will need IV Abx for 6 weeks -Per ID will defer on doing TEE and will treat for 6 weeks  regardless -PT Recommending SNF but patient and family refusing and still wanting to go home with Home Health   CKD stage III -Stable. Baseline of 1.2 -BUN/Cr went from 20/1.20 -> 20/1.27 -> 17/1.09 -> 17/1.12 -Avoid Nephrotoxic medications if possible; Restarted Home Lasix so will need to watch for rise in Cr -Repeat CMP in AM   Essential Hypertension -Controlled; BP was 131/54 -Continue w/ Carvedilol 6.25 mg po BID  -Will add IV Hydralazine if necessary   Hypothyroidism -Checked TSH and was 3.397  -Continue Levothyroxine 88 mcg po Daily   CAD s/p DES x2 in 2017 -Continue ASA 81 mg po Daily, Atorvastatin 20 mg po daily, and Carvedilol 6.25 mg po BID  -Will hold Atorvastatin given elevation in LFT's as patient is on Daptomycin and has a higher risk for Rhabodmyolysis   Atrial Fibrillation -Suspect is Paroxysmal -C/w Carvedilol 6.25 mg po BID  -Continue Apixaban 2.5 mg po BID   Suspected UTI -Urinalysis showed Hazy Color Urine, Moderate Hgb, Small Leukocytes, and 6-30 WBC's  -Urine culture showed 60,000 CFU of E. Faecalis; ? Colonization -IV Ceftriaxone was discontinued by Infectious Diseases -IV Vancomycin transitioned to IV Daptomycin q48h by Infectious Disease and will likely cover E Facecalis  Elevated Troponin -POC Troponin was 0.05 and Troponin I was 0.06 and then 0.07 -Likely 2/2 to Bacteremia -Continue To Monitor -Repeat TTE done today and showed EF of 50-55% with Grade 2 DD -Denies any active CP  Lactic Acidosis -Improved. LA went from 2.09 -> 3.37 -> 1.1 -> 1.0  Chronic Systolic and Diastolic CHF -Currently not decompensated  -Repeat ECHO 11/06/16 showed EF of  50-55% and Grade 2 DD -C/w Carvedilol 6.25 mg po BID -Monitor Strict I's/O's, Daily Weights, and SLIV -Patient is + 1.930 L; Weight is Up 3 lbs since admission -Will restart Home Lasix of 40 mg po Daily  -Continue to Monitor Volume Status  Abnormal LFT's -? Reactive from Abx in the Setting of  Daptomycin -AST went from 33 -> 67 -> 80 -ALT went from 21 -> 40 -> 57 -Will RUQ U/S and Acute Hepatitis Panel -AST/ALT elevating even more so will Hold Statin given higher risk of Rhabdomyolysis because of Daptomycin   Normocytic Anemia -Likely in the setting of Chronic Kidney Disease -Hb/Hct went from 11.7/36.2 -> 10.0/31.7 -> 10.6/32.9 -Continue to Monitor for S/Sx of Bleeding as patient is Anticoagulated with Apixaban -Repeat CBC in AM   Thrombocytopenia  -? From Infection vs. Abx -Improving. Platelet Count went from 112 -> 145 -Continue to Monitor and Repeat CBC in AM   DVT prophylaxis: SCD's; Anticoagulated with Apixaban Code Status: DO NOT RESUSCITATE Family Communication: Discussed with Daughter at bedside Disposition Plan: Home with Home Health when medically stable to D/C Home as patient adamantly refusing SNF  Consultants:   Infectious Diseases Dr. Jonny Ruiz Campbell/ Dr. Merceda Elks   Procedures:  TRANSTHORACIC ECHOCARDIOGRAM 10/31/17 ------------------------------------------------------------------- Study Conclusions  - Left ventricle: Distal septal and apical hypokinesis Systolic   function was normal. The estimated ejection fraction was in the   range of 50% to 55%. - Aortic valve: There was mild regurgitation. - Mitral valve: Severely calcified annulus. Mildly thickened   leaflets . There was mild regurgitation. - Left atrium: The atrium was mildly dilated. - Right atrium: The atrium was mildly dilated. - Atrial septum: No defect or patent foramen ovale was identified.  REPEAT TRANSTHORACIC ECHOCARDIOGRAM 11/06/17 ------------------------------------------------------------------- Study Conclusions  - Left ventricle: The cavity size was normal. Systolic function was   normal. The estimated ejection fraction was in the range of 50%   to 55%. Wall motion was normal; there were no regional wall   motion abnormalities. Features are consistent with a  pseudonormal   left ventricular filling pattern, with concomitant abnormal   relaxation and increased filling pressure (grade 2 diastolic   dysfunction). Doppler parameters are consistent with elevated   ventricular end-diastolic filling pressure. - Aortic valve: Trileaflet; mildly thickened, mildly calcified   leaflets. There was moderate regurgitation. - Mitral valve: Calcified annulus. Moderately thickened, severely   calcified leaflets . There was mild regurgitation. - Left atrium: The atrium was moderately dilated. - Right ventricle: Systolic function was normal. - Tricuspid valve: There was mild regurgitation. - Pulmonary arteries: Systolic pressure was within the normal   range. - Inferior vena cava: The vessel was normal in size. - Pericardium, extracardiac: There was no pericardial effusion.  Impressions:  - There was no evidence of a vegetation.  Antimicrobials:  Anti-infectives (From admission, onward)   Start     Dose/Rate Route Frequency Ordered Stop   11/06/17 1230  DAPTOmycin (CUBICIN) 500 mg in sodium chloride 0.9 % IVPB  Status:  Discontinued     500 mg 220 mL/hr over 30 Minutes Intravenous Every 24 hours 11/06/17 1018 11/06/17 1033   11/06/17 1200  vancomycin (VANCOCIN) 500 mg in sodium chloride 0.9 % 100 mL IVPB  Status:  Discontinued     500 mg 100 mL/hr over 60 Minutes Intravenous Every 24 hours 11/05/17 1046 11/06/17 1018   11/06/17 1200  DAPTOmycin (CUBICIN) 500 mg in sodium chloride 0.9 % IVPB     500  mg 220 mL/hr over 30 Minutes Intravenous Every 48 hours 11/06/17 1033     11/05/17 2200  cefTRIAXone (ROCEPHIN) 1 g in dextrose 5 % 50 mL IVPB  Status:  Discontinued     1 g 100 mL/hr over 30 Minutes Intravenous Every 24 hours 11/05/17 0036 11/05/17 1446   11/05/17 1045  vancomycin (VANCOCIN) 1,250 mg in sodium chloride 0.9 % 250 mL IVPB     1,250 mg 166.7 mL/hr over 90 Minutes Intravenous  Once 11/05/17 1040 11/05/17 1233   11/04/17 2145  cefTRIAXone  (ROCEPHIN) 1 g in dextrose 5 % 50 mL IVPB     1 g 100 mL/hr over 30 Minutes Intravenous  Once 11/04/17 2133 11/04/17 2251     Subjective: Seen and examined at bedside and was feeling better. States she is eating better but did not have a restful night last night. No CP or SOB. No other complaints or concerns at this time.   Objective: Vitals:   11/07/17 0838 11/07/17 1343 11/07/17 1919 11/08/17 0521  BP: (!) 144/48 (!) 138/52 121/65 (!) 131/54  Pulse: 66 68 64 66  Resp: 18 18 18 18   Temp:  98 F (36.7 C) 98.6 F (37 C) 98 F (36.7 C)  TempSrc:  Oral Oral Oral  SpO2: 94% 94% 94% 94%  Weight:    60.8 kg (134 lb 0.6 oz)  Height:        Intake/Output Summary (Last 24 hours) at 11/08/2017 1341 Last data filed at 11/08/2017 1211 Gross per 24 hour  Intake 530 ml  Output 250 ml  Net 280 ml   Filed Weights   11/06/17 0406 11/07/17 0611 11/08/17 0521  Weight: 61.4 kg (135 lb 5.8 oz) 61.9 kg (136 lb 7.4 oz) 60.8 kg (134 lb 0.6 oz)   Examination: Physical Exam:  Constitutional: WN/WD Caucasian female in NAD; appears calm and comfortable  Eyes: Sclerae anicteric. Lids normal ENMT: External Ears and nose appear normal. MMM Neck: Supple with no JVD Respiratory: Diminished but unlabored breathing. No appreciable wheezing/rales/rhonchi.  Cardiovascular: RRR; Trace-Mild LE Edema Abdomen: Soft, NT, ND. Bowel sounds present GU: Deferred Musculoskeletal: No contractures; No cyanosis Skin: Warm and dry, No appreciable rashes on a limited skin eval Neurologic: CN 2-12 grossly intact. No appreciable focal deficits Psychiatric: Normal mood and affect. Intact judgement and insight  Data Reviewed: I have personally reviewed following labs and imaging studies  CBC: Recent Labs  Lab 11/04/17 1807 11/05/17 0332 11/06/17 0824 11/07/17 0619 11/08/17 0558  WBC 8.1 9.6 10.3 7.8 6.5  NEUTROABS 6.6  --  8.3* 5.3 3.7  HGB 10.9* 10.7* 11.7* 10.0* 10.6*  HCT 34.0* 32.9* 36.2 31.7* 32.9*  MCV  95.0 94.8 96.5 96.1 95.6  PLT 112* 98* 112* 112* 145*   Basic Metabolic Panel: Recent Labs  Lab 11/04/17 1807 11/05/17 0332 11/06/17 0824 11/07/17 0619 11/08/17 0558  NA 134* 137 138 136 138  K 3.6 3.6 4.1 3.9 3.6  CL 98* 101 102 102 103  CO2 24 23 24 24 25   GLUCOSE 245* 160* 140* 119* 121*  BUN 22* 20 20 17 17   CREATININE 1.41* 1.20* 1.27* 1.09* 1.12*  CALCIUM 8.3* 8.0* 8.6* 8.2* 8.6*  MG  --   --  2.0 2.0 2.0  PHOS  --   --  2.9 2.8 3.6   GFR: Estimated Creatinine Clearance: 28.3 mL/min (A) (by C-G formula based on SCr of 1.12 mg/dL (H)). Liver Function Tests: Recent Labs  Lab 11/04/17 1807 11/06/17 9604  11/07/17 0619 11/08/17 0558  AST 42* 33 67* 80*  ALT 24 21 40 57*  ALKPHOS 46 49 45 48  BILITOT 1.1 1.1 0.8 0.9  PROT 6.2* 6.3* 5.4* 5.7*  ALBUMIN 2.8* 2.8* 2.4* 2.5*   Recent Labs  Lab 11/04/17 1807  LIPASE 55*   No results for input(s): AMMONIA in the last 168 hours. Coagulation Profile: No results for input(s): INR, PROTIME in the last 168 hours. Cardiac Enzymes: Recent Labs  Lab 11/05/17 0332 11/05/17 0640 11/06/17 0824  CKTOTAL  --   --  90  TROPONINI 0.06* 0.07*  --    BNP (last 3 results) No results for input(s): PROBNP in the last 8760 hours. HbA1C: No results for input(s): HGBA1C in the last 72 hours. CBG: No results for input(s): GLUCAP in the last 168 hours. Lipid Profile: No results for input(s): CHOL, HDL, LDLCALC, TRIG, CHOLHDL, LDLDIRECT in the last 72 hours. Thyroid Function Tests: Recent Labs    11/08/17 0558  TSH 3.397   Anemia Panel: No results for input(s): VITAMINB12, FOLATE, FERRITIN, TIBC, IRON, RETICCTPCT in the last 72 hours. Sepsis Labs: Recent Labs  Lab 11/04/17 1828 11/04/17 2145 11/05/17 0332 11/05/17 0640  LATICACIDVEN 2.09* 3.37* 1.1 1.0    Recent Results (from the past 240 hour(s))  Urine culture     Status: Abnormal   Collection Time: 10/30/17  1:02 PM  Result Value Ref Range Status   Specimen  Description URINE, CLEAN CATCH  Final   Special Requests NONE  Final   Culture MULTIPLE SPECIES PRESENT, SUGGEST RECOLLECTION (A)  Final   Report Status 10/31/2017 FINAL  Final  Culture, blood (Routine X 2) w Reflex to ID Panel     Status: None   Collection Time: 10/30/17  3:30 PM  Result Value Ref Range Status   Specimen Description BLOOD LEFT ARM  Final   Special Requests   Final    BOTTLES DRAWN AEROBIC AND ANAEROBIC Blood Culture adequate volume   Culture NO GROWTH 5 DAYS  Final   Report Status 11/04/2017 FINAL  Final  Culture, blood (Routine X 2) w Reflex to ID Panel     Status: None   Collection Time: 10/30/17  4:25 PM  Result Value Ref Range Status   Specimen Description BLOOD LEFT ARM  Final   Special Requests   Final    BOTTLES DRAWN AEROBIC AND ANAEROBIC Blood Culture adequate volume   Culture NO GROWTH 5 DAYS  Final   Report Status 11/04/2017 FINAL  Final  Respiratory Panel by PCR     Status: None   Collection Time: 10/30/17  7:10 PM  Result Value Ref Range Status   Adenovirus NOT DETECTED NOT DETECTED Final   Coronavirus 229E NOT DETECTED NOT DETECTED Final   Coronavirus HKU1 NOT DETECTED NOT DETECTED Final   Coronavirus NL63 NOT DETECTED NOT DETECTED Final   Coronavirus OC43 NOT DETECTED NOT DETECTED Final   Metapneumovirus NOT DETECTED NOT DETECTED Final   Rhinovirus / Enterovirus NOT DETECTED NOT DETECTED Final   Influenza A NOT DETECTED NOT DETECTED Final   Influenza B NOT DETECTED NOT DETECTED Final   Parainfluenza Virus 1 NOT DETECTED NOT DETECTED Final   Parainfluenza Virus 2 NOT DETECTED NOT DETECTED Final   Parainfluenza Virus 3 NOT DETECTED NOT DETECTED Final   Parainfluenza Virus 4 NOT DETECTED NOT DETECTED Final   Respiratory Syncytial Virus NOT DETECTED NOT DETECTED Final   Bordetella pertussis NOT DETECTED NOT DETECTED Final   Chlamydophila pneumoniae  NOT DETECTED NOT DETECTED Final   Mycoplasma pneumoniae NOT DETECTED NOT DETECTED Final  Culture,  blood (routine x 2)     Status: Abnormal   Collection Time: 11/04/17  6:08 PM  Result Value Ref Range Status   Specimen Description BLOOD LEFT UPPER ARM  Final   Special Requests   Final    BOTTLES DRAWN AEROBIC AND ANAEROBIC Blood Culture adequate volume   Culture  Setup Time   Final    GRAM POSITIVE COCCI IN BOTH AEROBIC AND ANAEROBIC BOTTLES CRITICAL RESULT CALLED TO, READ BACK BY AND VERIFIED WITH: Patrick North PHARMD 11/05/17 1037 A JW    Culture (A)  Final    STAPHYLOCOCCUS AUREUS SUSCEPTIBILITIES PERFORMED ON PREVIOUS CULTURE WITHIN THE LAST 5 DAYS.    Report Status 11/07/2017 FINAL  Final  Culture, blood (routine x 2)     Status: Abnormal   Collection Time: 11/04/17  6:20 PM  Result Value Ref Range Status   Specimen Description BLOOD RIGHT ARM  Final   Special Requests   Final    BOTTLES DRAWN AEROBIC AND ANAEROBIC Blood Culture adequate volume   Culture  Setup Time   Final    GRAM POSITIVE COCCI IN BOTH AEROBIC AND ANAEROBIC BOTTLES CRITICAL RESULT CALLED TO, READ BACK BY AND VERIFIED WITH: Patrick North PHARMD 11/05/17 1037 A JW    Culture METHICILLIN RESISTANT STAPHYLOCOCCUS AUREUS (A)  Final   Report Status 11/07/2017 FINAL  Final   Organism ID, Bacteria METHICILLIN RESISTANT STAPHYLOCOCCUS AUREUS  Final      Susceptibility   Methicillin resistant staphylococcus aureus - MIC*    CIPROFLOXACIN <=0.5 SENSITIVE Sensitive     ERYTHROMYCIN >=8 RESISTANT Resistant     GENTAMICIN <=0.5 SENSITIVE Sensitive     OXACILLIN >=4 RESISTANT Resistant     TETRACYCLINE <=1 SENSITIVE Sensitive     VANCOMYCIN 1 SENSITIVE Sensitive     TRIMETH/SULFA <=10 SENSITIVE Sensitive     CLINDAMYCIN <=0.25 SENSITIVE Sensitive     RIFAMPIN <=0.5 SENSITIVE Sensitive     Inducible Clindamycin NEGATIVE Sensitive     * METHICILLIN RESISTANT STAPHYLOCOCCUS AUREUS  Blood Culture ID Panel (Reflexed)     Status: Abnormal   Collection Time: 11/04/17  6:20 PM  Result Value Ref Range Status    Enterococcus species NOT DETECTED NOT DETECTED Final   Listeria monocytogenes NOT DETECTED NOT DETECTED Final   Staphylococcus species DETECTED (A) NOT DETECTED Final    Comment: CRITICAL RESULT CALLED TO, READ BACK BY AND VERIFIED WITH: Patrick North PHARMD 11/05/17 1037 A JW    Staphylococcus aureus DETECTED (A) NOT DETECTED Final    Comment: Methicillin (oxacillin)-resistant Staphylococcus aureus (MRSA). MRSA is predictably resistant to beta-lactam antibiotics (except ceftaroline). Preferred therapy is vancomycin unless clinically contraindicated. Patient requires contact precautions if  hospitalized. CRITICAL RESULT CALLED TO, READ BACK BY AND VERIFIED WITH: Patrick North PHARMD 11/05/17 1037 A JW    Methicillin resistance DETECTED (A) NOT DETECTED Final    Comment: CRITICAL RESULT CALLED TO, READ BACK BY AND VERIFIED WITH: Patrick North PHARMD 11/05/17 1037 A JW    Streptococcus species NOT DETECTED NOT DETECTED Final   Streptococcus agalactiae NOT DETECTED NOT DETECTED Final   Streptococcus pneumoniae NOT DETECTED NOT DETECTED Final   Streptococcus pyogenes NOT DETECTED NOT DETECTED Final   Acinetobacter baumannii NOT DETECTED NOT DETECTED Final   Enterobacteriaceae species NOT DETECTED NOT DETECTED Final   Enterobacter cloacae complex NOT DETECTED NOT DETECTED Final   Escherichia coli NOT DETECTED NOT  DETECTED Final   Klebsiella oxytoca NOT DETECTED NOT DETECTED Final   Klebsiella pneumoniae NOT DETECTED NOT DETECTED Final   Proteus species NOT DETECTED NOT DETECTED Final   Serratia marcescens NOT DETECTED NOT DETECTED Final   Haemophilus influenzae NOT DETECTED NOT DETECTED Final   Neisseria meningitidis NOT DETECTED NOT DETECTED Final   Pseudomonas aeruginosa NOT DETECTED NOT DETECTED Final   Candida albicans NOT DETECTED NOT DETECTED Final   Candida glabrata NOT DETECTED NOT DETECTED Final   Candida krusei NOT DETECTED NOT DETECTED Final   Candida parapsilosis NOT DETECTED  NOT DETECTED Final   Candida tropicalis NOT DETECTED NOT DETECTED Final  Urine culture     Status: Abnormal   Collection Time: 11/04/17  9:32 PM  Result Value Ref Range Status   Specimen Description URINE, RANDOM  Final   Special Requests NONE  Final   Culture 60,000 COLONIES/mL ENTEROCOCCUS FAECALIS (A)  Final   Report Status 11/07/2017 FINAL  Final   Organism ID, Bacteria ENTEROCOCCUS FAECALIS (A)  Final      Susceptibility   Enterococcus faecalis - MIC*    AMPICILLIN <=2 SENSITIVE Sensitive     LEVOFLOXACIN 1 SENSITIVE Sensitive     NITROFURANTOIN <=16 SENSITIVE Sensitive     VANCOMYCIN 1 SENSITIVE Sensitive     * 60,000 COLONIES/mL ENTEROCOCCUS FAECALIS  Culture, blood (routine x 2)     Status: Abnormal   Collection Time: 11/06/17  8:35 AM  Result Value Ref Range Status   Specimen Description BLOOD LEFT HAND  Final   Special Requests IN PEDIATRIC BOTTLE Blood Culture adequate volume  Final   Culture  Setup Time   Final    GRAM POSITIVE COCCI IN PEDIATRIC BOTTLE CRITICAL VALUE NOTED.  VALUE IS CONSISTENT WITH PREVIOUSLY REPORTED AND CALLED VALUE.    Culture (A)  Final    STAPHYLOCOCCUS AUREUS SUSCEPTIBILITIES PERFORMED ON PREVIOUS CULTURE WITHIN THE LAST 5 DAYS.    Report Status 11/08/2017 FINAL  Final  Culture, blood (routine x 2)     Status: Abnormal   Collection Time: 11/06/17  8:43 AM  Result Value Ref Range Status   Specimen Description BLOOD LEFT ARM  Final   Special Requests IN PEDIATRIC BOTTLE Blood Culture adequate volume  Final   Culture  Setup Time   Final    GRAM POSITIVE COCCI IN PEDIATRIC BOTTLE CRITICAL VALUE NOTED.  VALUE IS CONSISTENT WITH PREVIOUSLY REPORTED AND CALLED VALUE.    Culture (A)  Final    STAPHYLOCOCCUS AUREUS SUSCEPTIBILITIES PERFORMED ON PREVIOUS CULTURE WITHIN THE LAST 5 DAYS.    Report Status 11/08/2017 FINAL  Final    Radiology Studies: No results found. Scheduled Meds: . apixaban  2.5 mg Oral BID  . aspirin EC  81 mg Oral Q  M,W,F  . carvedilol  6.25 mg Oral BID WC  . furosemide  40 mg Oral Daily  . levothyroxine  88 mcg Oral QAC breakfast  . multivitamin  1 tablet Oral Daily   Continuous Infusions: . DAPTOmycin (CUBICIN)  IV 500 mg (11/08/17 1211)    LOS: 3 days   Merlene Laughtermair Latif Hiroko Tregre, DO [facility] Pager [phone]  If 7PM-7AM, please contact night-coverage [email or web address] Password [email or web address] 11/08/2017, 1:41 PM

## 2017-11-08 NOTE — Progress Notes (Signed)
Orders for today include added on blood work, and abdominal xray, and blood work for tomorrow morning.  Patient and her daughter at bedside were updated on plan of care.

## 2017-11-08 NOTE — Progress Notes (Signed)
[facility]   Reason for visit: Follow up on MRSA bacteremia  Interval History: no new issues Day 3 daptomycin Day 5 total antibiotics  Physical Exam: Constitutional:  Vitals:   11/07/17 1919 11/08/17 0521  BP: 121/65 (!) 131/54  Pulse: 64 66  Resp: 18 18  Temp: 98.6 F (37 C) 98 F (36.7 C)  SpO2: 94% 94%   patient appears in NAD   Review of Systems: Integument/breast: negative for rash   Lab Results  Component Value Date   WBC 6.5 11/08/2017   HGB 10.6 (L) 11/08/2017   HCT 32.9 (L) 11/08/2017   MCV 95.6 11/08/2017   PLT 145 (L) 11/08/2017    Lab Results  Component Value Date   CREATININE 1.12 (H) 11/08/2017   BUN 17 11/08/2017   NA 138 11/08/2017   K 3.6 11/08/2017   CL 103 11/08/2017   CO2 25 11/08/2017    Lab Results  Component Value Date   ALT 57 (H) 11/08/2017   AST 80 (H) 11/08/2017   ALKPHOS 48 11/08/2017     Microbiology: Recent Results (from the past 240 hour(s))  Urine culture     Status: Abnormal   Collection Time: 10/30/17  1:02 PM  Result Value Ref Range Status   Specimen Description URINE, CLEAN CATCH  Final   Special Requests NONE  Final   Culture MULTIPLE SPECIES PRESENT, SUGGEST RECOLLECTION (A)  Final   Report Status 10/31/2017 FINAL  Final  Culture, blood (Routine X 2) w Reflex to ID Panel     Status: None   Collection Time: 10/30/17  3:30 PM  Result Value Ref Range Status   Specimen Description BLOOD LEFT ARM  Final   Special Requests   Final    BOTTLES DRAWN AEROBIC AND ANAEROBIC Blood Culture adequate volume   Culture NO GROWTH 5 DAYS  Final   Report Status 11/04/2017 FINAL  Final  Culture, blood (Routine X 2) w Reflex to ID Panel     Status: None   Collection Time: 10/30/17  4:25 PM  Result Value Ref Range Status   Specimen Description BLOOD LEFT ARM  Final   Special Requests   Final    BOTTLES DRAWN AEROBIC AND ANAEROBIC Blood Culture adequate volume   Culture NO GROWTH 5 DAYS  Final   Report Status 11/04/2017 FINAL  Final  Respiratory Panel by PCR     Status: None   Collection Time: 10/30/17  7:10 PM  Result Value Ref Range Status   Adenovirus NOT DETECTED NOT DETECTED Final   Coronavirus 229E NOT DETECTED NOT DETECTED Final   Coronavirus HKU1 NOT DETECTED NOT DETECTED Final   Coronavirus NL63 NOT DETECTED NOT DETECTED Final   Coronavirus OC43 NOT DETECTED NOT DETECTED Final   Metapneumovirus NOT DETECTED NOT DETECTED Final   Rhinovirus / Enterovirus NOT DETECTED NOT DETECTED Final   Influenza A NOT DETECTED NOT DETECTED Final   Influenza B NOT DETECTED NOT DETECTED Final   Parainfluenza Virus 1 NOT DETECTED NOT DETECTED Final   Parainfluenza Virus 2 NOT DETECTED NOT DETECTED Final   Parainfluenza Virus 3 NOT DETECTED NOT DETECTED Final   Parainfluenza Virus 4 NOT DETECTED NOT DETECTED Final   Respiratory Syncytial Virus NOT DETECTED NOT DETECTED Final   Bordetella pertussis NOT DETECTED NOT DETECTED Final   Chlamydophila pneumoniae NOT DETECTED NOT DETECTED Final   Mycoplasma pneumoniae NOT DETECTED NOT DETECTED Final  Culture, blood (routine x 2)  Status: Abnormal   Collection Time: 11/04/17  6:08 PM  Result Value Ref Range Status   Specimen Description BLOOD LEFT UPPER ARM  Final   Special Requests   Final    BOTTLES DRAWN AEROBIC AND ANAEROBIC Blood Culture adequate volume   Culture  Setup Time   Final    GRAM POSITIVE COCCI IN BOTH AEROBIC AND ANAEROBIC BOTTLES CRITICAL RESULT CALLED TO, READ BACK BY AND VERIFIED WITH: Patrick North PHARMD 11/05/17 1037 A JW    Culture (A)  Final    STAPHYLOCOCCUS AUREUS SUSCEPTIBILITIES PERFORMED ON PREVIOUS CULTURE WITHIN THE LAST 5 DAYS.    Report Status 11/07/2017 FINAL  Final  Culture, blood (routine x 2)     Status: Abnormal   Collection Time: 11/04/17  6:20 PM  Result Value Ref Range Status   Specimen Description BLOOD RIGHT ARM  Final   Special Requests   Final    BOTTLES DRAWN AEROBIC AND ANAEROBIC Blood  Culture adequate volume   Culture  Setup Time   Final    GRAM POSITIVE COCCI IN BOTH AEROBIC AND ANAEROBIC BOTTLES CRITICAL RESULT CALLED TO, READ BACK BY AND VERIFIED WITH: Patrick North PHARMD 11/05/17 1037 A JW    Culture METHICILLIN RESISTANT STAPHYLOCOCCUS AUREUS (A)  Final   Report Status 11/07/2017 FINAL  Final   Organism ID, Bacteria METHICILLIN RESISTANT STAPHYLOCOCCUS AUREUS  Final      Susceptibility   Methicillin resistant staphylococcus aureus - MIC*    CIPROFLOXACIN <=0.5 SENSITIVE Sensitive     ERYTHROMYCIN >=8 RESISTANT Resistant     GENTAMICIN <=0.5 SENSITIVE Sensitive     OXACILLIN >=4 RESISTANT Resistant     TETRACYCLINE <=1 SENSITIVE Sensitive     VANCOMYCIN 1 SENSITIVE Sensitive     TRIMETH/SULFA <=10 SENSITIVE Sensitive     CLINDAMYCIN <=0.25 SENSITIVE Sensitive     RIFAMPIN <=0.5 SENSITIVE Sensitive     Inducible Clindamycin NEGATIVE Sensitive     * METHICILLIN RESISTANT STAPHYLOCOCCUS AUREUS  Blood Culture ID Panel (Reflexed)     Status: Abnormal   Collection Time: 11/04/17  6:20 PM  Result Value Ref Range Status   Enterococcus species NOT DETECTED NOT DETECTED Final   Listeria monocytogenes NOT DETECTED NOT DETECTED Final   Staphylococcus species DETECTED (A) NOT DETECTED Final    Comment: CRITICAL RESULT CALLED TO, READ BACK BY AND VERIFIED WITH: Patrick North PHARMD 11/05/17 1037 A JW    Staphylococcus aureus DETECTED (A) NOT DETECTED Final    Comment: Methicillin (oxacillin)-resistant Staphylococcus aureus (MRSA). MRSA is predictably resistant to beta-lactam antibiotics (except ceftaroline). Preferred therapy is vancomycin unless clinically contraindicated. Patient requires contact precautions if  hospitalized. CRITICAL RESULT CALLED TO, READ BACK BY AND VERIFIED WITH: Patrick North PHARMD 11/05/17 1037 A JW    Methicillin resistance DETECTED (A) NOT DETECTED Final    Comment: CRITICAL RESULT CALLED TO, READ BACK BY AND VERIFIED WITH: Patrick North  PHARMD 11/05/17 1037 A JW    Streptococcus species NOT DETECTED NOT DETECTED Final   Streptococcus agalactiae NOT DETECTED NOT DETECTED Final   Streptococcus pneumoniae NOT DETECTED NOT DETECTED Final   Streptococcus pyogenes NOT DETECTED NOT DETECTED Final   Acinetobacter baumannii NOT DETECTED NOT DETECTED Final   Enterobacteriaceae species NOT DETECTED NOT DETECTED Final   Enterobacter cloacae complex NOT DETECTED NOT DETECTED Final   Escherichia coli NOT DETECTED NOT DETECTED Final   Klebsiella oxytoca NOT DETECTED NOT DETECTED Final   Klebsiella pneumoniae NOT DETECTED NOT DETECTED Final   Proteus species  NOT DETECTED NOT DETECTED Final   Serratia marcescens NOT DETECTED NOT DETECTED Final   Haemophilus influenzae NOT DETECTED NOT DETECTED Final   Neisseria meningitidis NOT DETECTED NOT DETECTED Final   Pseudomonas aeruginosa NOT DETECTED NOT DETECTED Final   Candida albicans NOT DETECTED NOT DETECTED Final   Candida glabrata NOT DETECTED NOT DETECTED Final   Candida krusei NOT DETECTED NOT DETECTED Final   Candida parapsilosis NOT DETECTED NOT DETECTED Final   Candida tropicalis NOT DETECTED NOT DETECTED Final  Urine culture     Status: Abnormal   Collection Time: 11/04/17  9:32 PM  Result Value Ref Range Status   Specimen Description URINE, RANDOM  Final   Special Requests NONE  Final   Culture 60,000 COLONIES/mL ENTEROCOCCUS FAECALIS (A)  Final   Report Status 11/07/2017 FINAL  Final   Organism ID, Bacteria ENTEROCOCCUS FAECALIS (A)  Final      Susceptibility   Enterococcus faecalis - MIC*    AMPICILLIN <=2 SENSITIVE Sensitive     LEVOFLOXACIN 1 SENSITIVE Sensitive     NITROFURANTOIN <=16 SENSITIVE Sensitive     VANCOMYCIN 1 SENSITIVE Sensitive     * 60,000 COLONIES/mL ENTEROCOCCUS FAECALIS  Culture, blood (routine x 2)     Status: Abnormal   Collection Time: 11/06/17  8:35 AM  Result Value Ref Range Status   Specimen Description BLOOD LEFT HAND  Final   Special  Requests IN PEDIATRIC BOTTLE Blood Culture adequate volume  Final   Culture  Setup Time   Final    GRAM POSITIVE COCCI IN PEDIATRIC BOTTLE CRITICAL VALUE NOTED.  VALUE IS CONSISTENT WITH PREVIOUSLY REPORTED AND CALLED VALUE.    Culture (A)  Final    STAPHYLOCOCCUS AUREUS SUSCEPTIBILITIES PERFORMED ON PREVIOUS CULTURE WITHIN THE LAST 5 DAYS.    Report Status 11/08/2017 FINAL  Final  Culture, blood (routine x 2)     Status: Abnormal   Collection Time: 11/06/17  8:43 AM  Result Value Ref Range Status   Specimen Description BLOOD LEFT ARM  Final   Special Requests IN PEDIATRIC BOTTLE Blood Culture adequate volume  Final   Culture  Setup Time   Final    GRAM POSITIVE COCCI IN PEDIATRIC BOTTLE CRITICAL VALUE NOTED.  VALUE IS CONSISTENT WITH PREVIOUSLY REPORTED AND CALLED VALUE.    Culture (A)  Final    STAPHYLOCOCCUS AUREUS SUSCEPTIBILITIES PERFORMED ON PREVIOUS CULTURE WITHIN THE LAST 5 DAYS.    Report Status 11/08/2017 FINAL  Final    Impression/Plan:  1. MRSA bacteremia - persistently positive blood cultures. Septic thrombophlebitis.  Likely source of persistent bacteremia.   Repeat cultures sent today picc line Monday if repeat cultures remain negative.  Will need 6 weeks IV therapy.   2.  Access - as above, still with active bacteremia.  Recheck cultures and consider placing picc line after 72 hours if remains negative.    3.  Medication monitoring - baseline CK ok.  Will check weekly. dapto every other day due to renal insufficiency.   Dr. Drue Second available over the weekend if needed, otherwise I will follow up on Monday.

## 2017-11-09 ENCOUNTER — Inpatient Hospital Stay (HOSPITAL_COMMUNITY): Payer: Medicare Other

## 2017-11-09 LAB — COMPREHENSIVE METABOLIC PANEL
ALBUMIN: 2.7 g/dL — AB (ref 3.5–5.0)
ALK PHOS: 60 U/L (ref 38–126)
ALT: 59 U/L — AB (ref 14–54)
AST: 67 U/L — ABNORMAL HIGH (ref 15–41)
Anion gap: 13 (ref 5–15)
BILIRUBIN TOTAL: 1 mg/dL (ref 0.3–1.2)
BUN: 15 mg/dL (ref 6–20)
CALCIUM: 8.5 mg/dL — AB (ref 8.9–10.3)
CO2: 23 mmol/L (ref 22–32)
Chloride: 102 mmol/L (ref 101–111)
Creatinine, Ser: 1.09 mg/dL — ABNORMAL HIGH (ref 0.44–1.00)
GFR calc Af Amer: 50 mL/min — ABNORMAL LOW (ref >60)
GFR calc non Af Amer: 43 mL/min — ABNORMAL LOW (ref >60)
GLUCOSE: 163 mg/dL — AB (ref 65–99)
Potassium: 3.8 mmol/L (ref 3.5–5.1)
Sodium: 138 mmol/L (ref 135–145)
TOTAL PROTEIN: 6.4 g/dL — AB (ref 6.5–8.1)

## 2017-11-09 LAB — HEPATITIS PANEL, ACUTE
HCV Ab: <0.1 s/co ratio (ref 0.0–0.9)
HEP A IGM: NEGATIVE
Hep B C IgM: NEGATIVE
Hepatitis B Surface Ag: NEGATIVE

## 2017-11-09 LAB — CBC WITH DIFFERENTIAL/PLATELET
BASOS ABS: 0.1 10*3/uL (ref 0.0–0.1)
BASOS PCT: 1 %
Eosinophils Absolute: 0.2 10*3/uL (ref 0.0–0.7)
Eosinophils Relative: 3 %
HEMATOCRIT: 35 % — AB (ref 36.0–46.0)
Hemoglobin: 11.1 g/dL — ABNORMAL LOW (ref 12.0–15.0)
Lymphocytes Relative: 17 %
Lymphs Abs: 1.4 10*3/uL (ref 0.7–4.0)
MCH: 30.4 pg (ref 26.0–34.0)
MCHC: 31.7 g/dL (ref 30.0–36.0)
MCV: 95.9 fL (ref 78.0–100.0)
Monocytes Absolute: 0.9 10*3/uL (ref 0.1–1.0)
Monocytes Relative: 11 %
NEUTROS ABS: 5.6 10*3/uL (ref 1.7–7.7)
NEUTROS PCT: 68 %
Platelets: 197 10*3/uL (ref 150–400)
RBC: 3.65 MIL/uL — ABNORMAL LOW (ref 3.87–5.11)
RDW: 13.5 % (ref 11.5–15.5)
WBC: 8.1 10*3/uL (ref 4.0–10.5)

## 2017-11-09 LAB — PHOSPHORUS: Phosphorus: 3.1 mg/dL (ref 2.5–4.6)

## 2017-11-09 LAB — MAGNESIUM: Magnesium: 1.9 mg/dL (ref 1.7–2.4)

## 2017-11-09 NOTE — Progress Notes (Signed)
PROGRESS NOTE    Chelsea Fuller  ZOX:[id number] DOB: 29-Jun-1924 DOA: 11/04/2017 PCP: Nila Nephew, MD   Brief Narrative:  Chelsea Fuller is a 84 y.o. female with history of chronic systolic and diastolic CHF, CAD status post PCI in 2017, chronic atrial fibrillation, chronic kidney disease, hypothyroidism presents to the ER with complaints of weakness.  Patient was just recently discharged 4 days ago after being treated for acute CHF.  Patient stated since discharge patient has been getting more fatigued and weak. Was admitted and found to have an MRSA Bacteremia. ID consulted and adjusted Abx from IV Vancomycin to IV Daptomycin. Patient continues to have persistently positive Blood Cx and suspected source is septic Thrombopheblitis. ID repeated Blood Cx yesterday AM and recommending placing PICC after 72 hours from Negative Blood Cx's for treatment of 6 weeks total after Negative Cx's.   Assessment & Plan:   Principal Problem:   MRSA bacteremia Active Problems:   Anemia   CKD (chronic kidney disease), stage III (HCC)   Ischemic cardiomyopathy   CAD (coronary artery disease)-DES/LAD 2017   Hypertension   Hypothyroidism, adult   Permanent atrial fibrillation (HCC)   Weakness generalized   Weakness   Septic thrombophlebitis of upper extremity   Abnormal LFTs   Thrombocytopenia (HCC)   Normocytic anemia  Acute MRSA Bacteremia from likely suspected Septic Thrombophlebitis of the Left Arm -Initial Blood Cx 2/2 bottles positive. Likely contributed to patient's overall malaise.  -Started Vancomycin and transitioned to IV Daptomycin by ID -Repeat Blood Cx 11/06/17 AM still Positive -ID recommendations appreciated  -Repeat Transthoracic Echocardiogram as below and showed no Vegetations  -Continue to Follow ID Recc's; Blood Cx's repeated 11/08/17 AM Pending and will likely place PICC 72 hours after Negative Blood Cx and will need IV Abx for 6 weeks -Per ID will defer on doing TEE and  will treat for 6 weeks regardless -PT Recommending SNF but patient and family refusing and still wanting to go home with Home Health   CKD stage III -Stable. Baseline of 1.2 -BUN/Cr Stable at 15/1.09 -Avoid Nephrotoxic medications if possible; Restarted Home Lasix so will need to watch for rise in Cr -Repeat CMP in AM   Essential Hypertension -Controlled; BP was 135/68  -Continue w/ Carvedilol 6.25 mg po BID  -Will add IV Hydralazine if necessary   Hypothyroidism -Checked TSH and was 3.397  -Continue Levothyroxine 88 mcg po Daily   CAD s/p DES x2 in 2017 -Continue ASA 81 mg po Daily, Atorvastatin 20 mg po daily, and Carvedilol 6.25 mg po BID  -Will hold Atorvastatin given elevation in LFT's as patient is on Daptomycin and has a higher risk for Rhabodmyolysis   Atrial Fibrillation -Suspect is Paroxysmal -C/w Carvedilol 6.25 mg po BID  -Continue Apixaban 2.5 mg po BID   Suspected UTI -Urinalysis showed Hazy Color Urine, Moderate Hgb, Small Leukocytes, and 6-30 WBC's  -Urine culture showed 60,000 CFU of E. Faecalis; ? Colonization -IV Ceftriaxone was discontinued by Infectious Diseases -IV Vancomycin transitioned to IV Daptomycin q48h by Infectious Disease and will likely cover E Facecalis  Elevated Troponin -POC Troponin was 0.05 and Troponin I was 0.06 and then 0.07 -Likely 2/2 to Bacteremia -Continue To Monitor -Repeat TTE done today and showed EF of 50-55% with Grade 2 DD -Denies any active CP  Lactic Acidosis -Improved. LA went from 2.09 -> 3.37 -> 1.1 -> 1.0  Chronic Systolic and Diastolic CHF -Currently not decompensated  -Repeat ECHO 11/06/16 showed EF of 50-55%  and Grade 2 DD -C/w Carvedilol 6.25 mg po BID -Monitor Strict I's/O's, Daily Weights, and SLIV -Patient is + 1.020 L; Weight is Up 4 lbs since admission -Will restart Home Lasix of 40 mg po Daily  -Continue to Monitor Volume Status  Abnormal LFT's -? Reactive from Abx in the Setting of  Daptomycin -AST went from 33 -> 67 -> 80 -> 67 -ALT went from 21 -> 40 -> 57 -> 59 -RUQ U/S showed Surgically absent gallbladder with normal ultrasound appearance of the liver and no evidence of biliary obstruction.   -Acute Hepatitis Panel Negative -AST/ALT elevated but stable; Continue to Hold Statin given higher risk of Rhabdomyolysis because of Daptomycin   Normocytic Anemia -Likely in the setting of Chronic Kidney Disease -Hb/Hct went from 11.7/36.2 -> 10.0/31.7 -> 10.6/32.9 -> 11.1/35.0 -Continue to Monitor for S/Sx of Bleeding as patient is Anticoagulated with Apixaban -Repeat CBC in AM   Thrombocytopenia  -? From Infection vs. Abx -Improving. Platelet Count went from 112 -> 145 -Continue to Monitor and Repeat CBC in AM   DVT prophylaxis: SCD's; Anticoagulated with Apixaban Code Status: DO NOT RESUSCITATE Family Communication: Discussed with Daughter at bedside Disposition Plan: Home with Home Health when medically stable to D/C Home as patient adamantly refusing SNF  Consultants:   Infectious Diseases Dr. Jonny Ruiz Campbell/ Dr. Merceda Elks   Procedures:  TRANSTHORACIC ECHOCARDIOGRAM 10/31/17 ------------------------------------------------------------------- Study Conclusions  - Left ventricle: Distal septal and apical hypokinesis Systolic   function was normal. The estimated ejection fraction was in the   range of 50% to 55%. - Aortic valve: There was mild regurgitation. - Mitral valve: Severely calcified annulus. Mildly thickened   leaflets . There was mild regurgitation. - Left atrium: The atrium was mildly dilated. - Right atrium: The atrium was mildly dilated. - Atrial septum: No defect or patent foramen ovale was identified.  REPEAT TRANSTHORACIC ECHOCARDIOGRAM 11/06/17 ------------------------------------------------------------------- Study Conclusions  - Left ventricle: The cavity size was normal. Systolic function was   normal. The estimated ejection  fraction was in the range of 50%   to 55%. Wall motion was normal; there were no regional wall   motion abnormalities. Features are consistent with a pseudonormal   left ventricular filling pattern, with concomitant abnormal   relaxation and increased filling pressure (grade 2 diastolic   dysfunction). Doppler parameters are consistent with elevated   ventricular end-diastolic filling pressure. - Aortic valve: Trileaflet; mildly thickened, mildly calcified   leaflets. There was moderate regurgitation. - Mitral valve: Calcified annulus. Moderately thickened, severely   calcified leaflets . There was mild regurgitation. - Left atrium: The atrium was moderately dilated. - Right ventricle: Systolic function was normal. - Tricuspid valve: There was mild regurgitation. - Pulmonary arteries: Systolic pressure was within the normal   range. - Inferior vena cava: The vessel was normal in size. - Pericardium, extracardiac: There was no pericardial effusion.  Impressions:  - There was no evidence of a vegetation.  Antimicrobials:  Anti-infectives (From admission, onward)   Start     Dose/Rate Route Frequency Ordered Stop   11/06/17 1230  DAPTOmycin (CUBICIN) 500 mg in sodium chloride 0.9 % IVPB  Status:  Discontinued     500 mg 220 mL/hr over 30 Minutes Intravenous Every 24 hours 11/06/17 1018 11/06/17 1033   11/06/17 1200  vancomycin (VANCOCIN) 500 mg in sodium chloride 0.9 % 100 mL IVPB  Status:  Discontinued     500 mg 100 mL/hr over 60 Minutes Intravenous Every 24 hours 11/05/17  1046 11/06/17 1018   11/06/17 1200  DAPTOmycin (CUBICIN) 500 mg in sodium chloride 0.9 % IVPB     500 mg 220 mL/hr over 30 Minutes Intravenous Every 48 hours 11/06/17 1033     11/05/17 2200  cefTRIAXone (ROCEPHIN) 1 g in dextrose 5 % 50 mL IVPB  Status:  Discontinued     1 g 100 mL/hr over 30 Minutes Intravenous Every 24 hours 11/05/17 0036 11/05/17 1446   11/05/17 1045  vancomycin (VANCOCIN) 1,250 mg in  sodium chloride 0.9 % 250 mL IVPB     1,250 mg 166.7 mL/hr over 90 Minutes Intravenous  Once 11/05/17 1040 11/05/17 1233   11/04/17 2145  cefTRIAXone (ROCEPHIN) 1 g in dextrose 5 % 50 mL IVPB     1 g 100 mL/hr over 30 Minutes Intravenous  Once 11/04/17 2133 11/04/17 2251     Subjective: Seen and examined at bedside and had no complaints. Was anxious about wanting to go home. No CP or SOB. No other concerns or complaints at this time.    Objective: Vitals:   11/08/17 0521 11/08/17 1546 11/08/17 1927 11/09/17 0622  BP: (!) 131/54 (!) 132/44 (!) 138/54 135/68  Pulse: 66 62 65 66  Resp: 18 18 18 18   Temp: 98 F (36.7 C) (!) 97.4 F (36.3 C) 98.3 F (36.8 C) 98.3 F (36.8 C)  TempSrc: Oral Oral Oral Oral  SpO2: 94% 94% 94% 93%  Weight: 60.8 kg (134 lb 0.6 oz)   61.5 kg (135 lb 9.3 oz)  Height:        Intake/Output Summary (Last 24 hours) at 11/09/2017 0741 Last data filed at 11/09/2017 1610 Gross per 24 hour  Intake 350 ml  Output 1150 ml  Net -800 ml   Filed Weights   11/07/17 0611 11/08/17 0521 11/09/17 0622  Weight: 61.9 kg (136 lb 7.4 oz) 60.8 kg (134 lb 0.6 oz) 61.5 kg (135 lb 9.3 oz)   Examination: Physical Exam:  Constitutional: WN/WD elderly Caucasian female in NAD; Appears calm and comfortable.  Eyes: Sclerae anicteric; Lids normal ENMT: External Ears and Nose appear normal. MMM Neck: Supple with no JVD Respiratory: Diminished but no wheezing/rales/rhonchi. Patient was not tachypenic or using any accessory muscles to breathe Cardiovascular: RRR; No appreciable LE today Abdomen: Soft, NT, ND. Bowel sounds present  GU: Deferred Musculoskeletal: No contractures; No cyanosis Skin: Warm and dry; No appreciable rashes on a limited skin eval Neurologic: CN 2-12 grossly intact. No appreciable focal deficits  Psychiatric: Normal mood and affect. Intact judgement and insight  Data Reviewed: I have personally reviewed following labs and imaging studies  CBC: Recent  Labs  Lab 11/04/17 1807 11/05/17 0332 11/06/17 0824 11/07/17 0619 11/08/17 0558  WBC 8.1 9.6 10.3 7.8 6.5  NEUTROABS 6.6  --  8.3* 5.3 3.7  HGB 10.9* 10.7* 11.7* 10.0* 10.6*  HCT 34.0* 32.9* 36.2 31.7* 32.9*  MCV 95.0 94.8 96.5 96.1 95.6  PLT 112* 98* 112* 112* 145*   Basic Metabolic Panel: Recent Labs  Lab 11/04/17 1807 11/05/17 0332 11/06/17 0824 11/07/17 0619 11/08/17 0558  NA 134* 137 138 136 138  K 3.6 3.6 4.1 3.9 3.6  CL 98* 101 102 102 103  CO2 24 23 24 24 25   GLUCOSE 245* 160* 140* 119* 121*  BUN 22* 20 20 17 17   CREATININE 1.41* 1.20* 1.27* 1.09* 1.12*  CALCIUM 8.3* 8.0* 8.6* 8.2* 8.6*  MG  --   --  2.0 2.0 2.0  PHOS  --   --  2.9 2.8 3.6   GFR: Estimated Creatinine Clearance: 28.3 mL/min (A) (by C-G formula based on SCr of 1.12 mg/dL (H)). Liver Function Tests: Recent Labs  Lab 11/04/17 1807 11/06/17 0824 11/07/17 0619 11/08/17 0558  AST 42* 33 67* 80*  ALT 24 21 40 57*  ALKPHOS 46 49 45 48  BILITOT 1.1 1.1 0.8 0.9  PROT 6.2* 6.3* 5.4* 5.7*  ALBUMIN 2.8* 2.8* 2.4* 2.5*   Recent Labs  Lab 11/04/17 1807  LIPASE 55*   No results for input(s): AMMONIA in the last 168 hours. Coagulation Profile: No results for input(s): INR, PROTIME in the last 168 hours. Cardiac Enzymes: Recent Labs  Lab 11/05/17 0332 11/05/17 0640 11/06/17 0824  CKTOTAL  --   --  90  TROPONINI 0.06* 0.07*  --    BNP (last 3 results) No results for input(s): PROBNP in the last 8760 hours. HbA1C: No results for input(s): HGBA1C in the last 72 hours. CBG: No results for input(s): GLUCAP in the last 168 hours. Lipid Profile: No results for input(s): CHOL, HDL, LDLCALC, TRIG, CHOLHDL, LDLDIRECT in the last 72 hours. Thyroid Function Tests: Recent Labs    11/08/17 0558  TSH 3.397   Anemia Panel: No results for input(s): VITAMINB12, FOLATE, FERRITIN, TIBC, IRON, RETICCTPCT in the last 72 hours. Sepsis Labs: Recent Labs  Lab 11/04/17 1828 11/04/17 2145  11/05/17 0332 11/05/17 0640  LATICACIDVEN 2.09* 3.37* 1.1 1.0    Recent Results (from the past 240 hour(s))  Urine culture     Status: Abnormal   Collection Time: 10/30/17  1:02 PM  Result Value Ref Range Status   Specimen Description URINE, CLEAN CATCH  Final   Special Requests NONE  Final   Culture MULTIPLE SPECIES PRESENT, SUGGEST RECOLLECTION (A)  Final   Report Status 10/31/2017 FINAL  Final  Culture, blood (Routine X 2) w Reflex to ID Panel     Status: None   Collection Time: 10/30/17  3:30 PM  Result Value Ref Range Status   Specimen Description BLOOD LEFT ARM  Final   Special Requests   Final    BOTTLES DRAWN AEROBIC AND ANAEROBIC Blood Culture adequate volume   Culture NO GROWTH 5 DAYS  Final   Report Status 11/04/2017 FINAL  Final  Culture, blood (Routine X 2) w Reflex to ID Panel     Status: None   Collection Time: 10/30/17  4:25 PM  Result Value Ref Range Status   Specimen Description BLOOD LEFT ARM  Final   Special Requests   Final    BOTTLES DRAWN AEROBIC AND ANAEROBIC Blood Culture adequate volume   Culture NO GROWTH 5 DAYS  Final   Report Status 11/04/2017 FINAL  Final  Respiratory Panel by PCR     Status: None   Collection Time: 10/30/17  7:10 PM  Result Value Ref Range Status   Adenovirus NOT DETECTED NOT DETECTED Final   Coronavirus 229E NOT DETECTED NOT DETECTED Final   Coronavirus HKU1 NOT DETECTED NOT DETECTED Final   Coronavirus NL63 NOT DETECTED NOT DETECTED Final   Coronavirus OC43 NOT DETECTED NOT DETECTED Final   Metapneumovirus NOT DETECTED NOT DETECTED Final   Rhinovirus / Enterovirus NOT DETECTED NOT DETECTED Final   Influenza A NOT DETECTED NOT DETECTED Final   Influenza B NOT DETECTED NOT DETECTED Final   Parainfluenza Virus 1 NOT DETECTED NOT DETECTED Final   Parainfluenza Virus 2 NOT DETECTED NOT DETECTED Final   Parainfluenza Virus 3 NOT DETECTED NOT DETECTED Final  Parainfluenza Virus 4 NOT DETECTED NOT DETECTED Final   Respiratory  Syncytial Virus NOT DETECTED NOT DETECTED Final   Bordetella pertussis NOT DETECTED NOT DETECTED Final   Chlamydophila pneumoniae NOT DETECTED NOT DETECTED Final   Mycoplasma pneumoniae NOT DETECTED NOT DETECTED Final  Culture, blood (routine x 2)     Status: Abnormal   Collection Time: 11/04/17  6:08 PM  Result Value Ref Range Status   Specimen Description BLOOD LEFT UPPER ARM  Final   Special Requests   Final    BOTTLES DRAWN AEROBIC AND ANAEROBIC Blood Culture adequate volume   Culture  Setup Time   Final    GRAM POSITIVE COCCI IN BOTH AEROBIC AND ANAEROBIC BOTTLES CRITICAL RESULT CALLED TO, READ BACK BY AND VERIFIED WITH: Patrick North. BATCHELDER PHARMD 11/05/17 1037 A JW    Culture (A)  Final    STAPHYLOCOCCUS AUREUS SUSCEPTIBILITIES PERFORMED ON PREVIOUS CULTURE WITHIN THE LAST 5 DAYS.    Report Status 11/07/2017 FINAL  Final  Culture, blood (routine x 2)     Status: Abnormal   Collection Time: 11/04/17  6:20 PM  Result Value Ref Range Status   Specimen Description BLOOD RIGHT ARM  Final   Special Requests   Final    BOTTLES DRAWN AEROBIC AND ANAEROBIC Blood Culture adequate volume   Culture  Setup Time   Final    GRAM POSITIVE COCCI IN BOTH AEROBIC AND ANAEROBIC BOTTLES CRITICAL RESULT CALLED TO, READ BACK BY AND VERIFIED WITH: Patrick North. BATCHELDER PHARMD 11/05/17 1037 A JW    Culture METHICILLIN RESISTANT STAPHYLOCOCCUS AUREUS (A)  Final   Report Status 11/07/2017 FINAL  Final   Organism ID, Bacteria METHICILLIN RESISTANT STAPHYLOCOCCUS AUREUS  Final      Susceptibility   Methicillin resistant staphylococcus aureus - MIC*    CIPROFLOXACIN <=0.5 SENSITIVE Sensitive     ERYTHROMYCIN >=8 RESISTANT Resistant     GENTAMICIN <=0.5 SENSITIVE Sensitive     OXACILLIN >=4 RESISTANT Resistant     TETRACYCLINE <=1 SENSITIVE Sensitive     VANCOMYCIN 1 SENSITIVE Sensitive     TRIMETH/SULFA <=10 SENSITIVE Sensitive     CLINDAMYCIN <=0.25 SENSITIVE Sensitive     RIFAMPIN <=0.5 SENSITIVE Sensitive      Inducible Clindamycin NEGATIVE Sensitive     * METHICILLIN RESISTANT STAPHYLOCOCCUS AUREUS  Blood Culture ID Panel (Reflexed)     Status: Abnormal   Collection Time: 11/04/17  6:20 PM  Result Value Ref Range Status   Enterococcus species NOT DETECTED NOT DETECTED Final   Listeria monocytogenes NOT DETECTED NOT DETECTED Final   Staphylococcus species DETECTED (A) NOT DETECTED Final    Comment: CRITICAL RESULT CALLED TO, READ BACK BY AND VERIFIED WITH: Patrick North. BATCHELDER PHARMD 11/05/17 1037 A JW    Staphylococcus aureus DETECTED (A) NOT DETECTED Final    Comment: Methicillin (oxacillin)-resistant Staphylococcus aureus (MRSA). MRSA is predictably resistant to beta-lactam antibiotics (except ceftaroline). Preferred therapy is vancomycin unless clinically contraindicated. Patient requires contact precautions if  hospitalized. CRITICAL RESULT CALLED TO, READ BACK BY AND VERIFIED WITH: Patrick North. BATCHELDER PHARMD 11/05/17 1037 A JW    Methicillin resistance DETECTED (A) NOT DETECTED Final    Comment: CRITICAL RESULT CALLED TO, READ BACK BY AND VERIFIED WITH: Patrick North. BATCHELDER PHARMD 11/05/17 1037 A JW    Streptococcus species NOT DETECTED NOT DETECTED Final   Streptococcus agalactiae NOT DETECTED NOT DETECTED Final   Streptococcus pneumoniae NOT DETECTED NOT DETECTED Final   Streptococcus pyogenes NOT DETECTED NOT DETECTED Final   Acinetobacter baumannii  NOT DETECTED NOT DETECTED Final   Enterobacteriaceae species NOT DETECTED NOT DETECTED Final   Enterobacter cloacae complex NOT DETECTED NOT DETECTED Final   Escherichia coli NOT DETECTED NOT DETECTED Final   Klebsiella oxytoca NOT DETECTED NOT DETECTED Final   Klebsiella pneumoniae NOT DETECTED NOT DETECTED Final   Proteus species NOT DETECTED NOT DETECTED Final   Serratia marcescens NOT DETECTED NOT DETECTED Final   Haemophilus influenzae NOT DETECTED NOT DETECTED Final   Neisseria meningitidis NOT DETECTED NOT DETECTED Final   Pseudomonas aeruginosa  NOT DETECTED NOT DETECTED Final   Candida albicans NOT DETECTED NOT DETECTED Final   Candida glabrata NOT DETECTED NOT DETECTED Final   Candida krusei NOT DETECTED NOT DETECTED Final   Candida parapsilosis NOT DETECTED NOT DETECTED Final   Candida tropicalis NOT DETECTED NOT DETECTED Final  Urine culture     Status: Abnormal   Collection Time: 11/04/17  9:32 PM  Result Value Ref Range Status   Specimen Description URINE, RANDOM  Final   Special Requests NONE  Final   Culture 60,000 COLONIES/mL ENTEROCOCCUS FAECALIS (A)  Final   Report Status 11/07/2017 FINAL  Final   Organism ID, Bacteria ENTEROCOCCUS FAECALIS (A)  Final      Susceptibility   Enterococcus faecalis - MIC*    AMPICILLIN <=2 SENSITIVE Sensitive     LEVOFLOXACIN 1 SENSITIVE Sensitive     NITROFURANTOIN <=16 SENSITIVE Sensitive     VANCOMYCIN 1 SENSITIVE Sensitive     * 60,000 COLONIES/mL ENTEROCOCCUS FAECALIS  Culture, blood (routine x 2)     Status: Abnormal   Collection Time: 11/06/17  8:35 AM  Result Value Ref Range Status   Specimen Description BLOOD LEFT HAND  Final   Special Requests IN PEDIATRIC BOTTLE Blood Culture adequate volume  Final   Culture  Setup Time   Final    GRAM POSITIVE COCCI IN PEDIATRIC BOTTLE CRITICAL VALUE NOTED.  VALUE IS CONSISTENT WITH PREVIOUSLY REPORTED AND CALLED VALUE.    Culture (A)  Final    STAPHYLOCOCCUS AUREUS SUSCEPTIBILITIES PERFORMED ON PREVIOUS CULTURE WITHIN THE LAST 5 DAYS.    Report Status 11/08/2017 FINAL  Final  Culture, blood (routine x 2)     Status: Abnormal   Collection Time: 11/06/17  8:43 AM  Result Value Ref Range Status   Specimen Description BLOOD LEFT ARM  Final   Special Requests IN PEDIATRIC BOTTLE Blood Culture adequate volume  Final   Culture  Setup Time   Final    GRAM POSITIVE COCCI IN PEDIATRIC BOTTLE CRITICAL VALUE NOTED.  VALUE IS CONSISTENT WITH PREVIOUSLY REPORTED AND CALLED VALUE.    Culture (A)  Final    STAPHYLOCOCCUS  AUREUS SUSCEPTIBILITIES PERFORMED ON PREVIOUS CULTURE WITHIN THE LAST 5 DAYS.    Report Status 11/08/2017 FINAL  Final    Radiology Studies: US Abdomen Limited Ruq  Result Date: 11/09/2017 CLINICAL DATA:  84 year old female with abnormal LFTs. EXAM: ULTRASOUND ABDOMEN LIMITED RIGHT UPPER QUADRANT COMPARISON:  CT Abdomen and Pelvis without contrast 11/04/2017 FINDINGS: Gallbladder: Surgically absent. Common bile duct: Diameter: 6 millimeters, normal. Liver: Liver echogenicity within normal limits. No intrahepatic biliary ductal dilatation or discrete liver lesion. Portal vein is patent on color Doppler imaging with normal direction of blood flow towards the liver (image 25). Other findings: Stable visible right kidney including simple appearing right upper pole cyst. IMPRESSION: Surgically absent gallbladder with normal ultrasound appearance of the liver and no evidence of biliary obstruction. Electronically Signed   By: Rexene Edison  Margo Aye M.D.   On: 11/09/2017 07:22   Scheduled Meds: . apixaban  2.5 mg Oral BID  . aspirin EC  81 mg Oral Q M,W,F  . carvedilol  6.25 mg Oral BID WC  . furosemide  40 mg Oral Daily  . levothyroxine  88 mcg Oral QAC breakfast  . multivitamin  1 tablet Oral Daily   Continuous Infusions: . DAPTOmycin (CUBICIN)  IV Stopped (11/08/17 1505)    LOS: 4 days   Merlene Laughter, DO [facility] Pager [phone]  If 7PM-7AM, please contact night-coverage [email or web address] Password [facility] 11/09/2017, 7:41 AM

## 2017-11-10 LAB — COMPREHENSIVE METABOLIC PANEL
ALT: 59 U/L — ABNORMAL HIGH (ref 14–54)
AST: 58 U/L — AB (ref 15–41)
Albumin: 2.6 g/dL — ABNORMAL LOW (ref 3.5–5.0)
Alkaline Phosphatase: 59 U/L (ref 38–126)
Anion gap: 10 (ref 5–15)
BILIRUBIN TOTAL: 0.8 mg/dL (ref 0.3–1.2)
BUN: 16 mg/dL (ref 6–20)
CO2: 27 mmol/L (ref 22–32)
CREATININE: 1.06 mg/dL — AB (ref 0.44–1.00)
Calcium: 8.7 mg/dL — ABNORMAL LOW (ref 8.9–10.3)
Chloride: 104 mmol/L (ref 101–111)
GFR, EST AFRICAN AMERICAN: 52 mL/min — AB (ref >60)
GFR, EST NON AFRICAN AMERICAN: 44 mL/min — AB (ref >60)
Glucose, Bld: 126 mg/dL — ABNORMAL HIGH (ref 65–99)
POTASSIUM: 4 mmol/L (ref 3.5–5.1)
Sodium: 141 mmol/L (ref 135–145)
TOTAL PROTEIN: 6.1 g/dL — AB (ref 6.5–8.1)

## 2017-11-10 LAB — CBC WITH DIFFERENTIAL/PLATELET
Basophils Absolute: 0 10*3/uL (ref 0.0–0.1)
Basophils Relative: 1 %
EOS ABS: 0.3 10*3/uL (ref 0.0–0.7)
EOS PCT: 4 %
HCT: 35.6 % — ABNORMAL LOW (ref 36.0–46.0)
Hemoglobin: 11.5 g/dL — ABNORMAL LOW (ref 12.0–15.0)
LYMPHS ABS: 1.9 10*3/uL (ref 0.7–4.0)
Lymphocytes Relative: 25 %
MCH: 31 pg (ref 26.0–34.0)
MCHC: 32.3 g/dL (ref 30.0–36.0)
MCV: 96 fL (ref 78.0–100.0)
MONO ABS: 0.8 10*3/uL (ref 0.1–1.0)
MONOS PCT: 11 %
NEUTROS PCT: 61 %
Neutro Abs: 4.8 10*3/uL (ref 1.7–7.7)
Platelets: 216 10*3/uL (ref 150–400)
RBC: 3.71 MIL/uL — ABNORMAL LOW (ref 3.87–5.11)
RDW: 13.5 % (ref 11.5–15.5)
WBC: 7.8 10*3/uL (ref 4.0–10.5)

## 2017-11-10 LAB — PHOSPHORUS: Phosphorus: 3.5 mg/dL (ref 2.5–4.6)

## 2017-11-10 LAB — MAGNESIUM: MAGNESIUM: 2.1 mg/dL (ref 1.7–2.4)

## 2017-11-10 NOTE — Progress Notes (Signed)
Pt is stable, vitals stable, pt ambulated in a hallway without having distress, SOB and denies chest pain, will continue to monitor the patient

## 2017-11-10 NOTE — Progress Notes (Signed)
PROGRESS NOTE    Chelsea Fuller  ZOX:[id number] DOB: [birth year] DOA: 11/04/2017 PCP: Nila Nephew, MD   Brief Narrative:  Chelsea Fuller is a 84 y.o. female with history of chronic systolic and diastolic CHF, CAD status post PCI in 2017, chronic atrial fibrillation, chronic kidney disease, hypothyroidism presents to the ER with complaints of weakness.  Patient was just recently discharged 4 days ago after being treated for acute CHF.  Patient stated since discharge patient has been getting more fatigued and weak. Was admitted and found to have an MRSA Bacteremia. ID consulted and adjusted Abx from IV Vancomycin to IV Daptomycin. Patient continues to have persistently positive Blood Cx and suspected source is septic Thrombopheblitis. ID repeated Blood Cx yesterday AM and recommending placing PICC after 72 hours from Negative Blood Cx's for treatment of 6 weeks total after Negative Cx's. Patient's Blood Cx's on 11/08/17 finally came back Negative at 1 Day and anticipate placing PICC possibly by tomorrow.   Assessment & Plan:   Principal Problem:   MRSA bacteremia Active Problems:   Anemia   CKD (chronic kidney disease), stage III (HCC)   Ischemic cardiomyopathy   CAD (coronary artery disease)-DES/LAD 2017   Hypertension   Hypothyroidism, adult   Permanent atrial fibrillation (HCC)   Weakness generalized   Weakness   Septic thrombophlebitis of upper extremity   Abnormal LFTs   Thrombocytopenia (HCC)   Normocytic anemia  Acute MRSA Bacteremia from likely suspected Septic Thrombophlebitis of the Left Arm -Initial Blood Cx 2/2 bottles positive. Likely contributed to patient's overall malaise.  -Started Vancomycin and transitioned to IV Daptomycin by ID -Repeat Blood Cx 11/06/17 AM still Positive -ID recommendations appreciated  -Repeat Transthoracic Echocardiogram as below and showed no Vegetations  -Continue to Follow ID Recc's; -Blood Cx's repeated 11/08/17 AM showed NGTD at 1  day and will likely place PICC 72 hours after Negative Blood Cx and will need IV Abx for 6 weeks; PICC Line possibly to be placed tomorrow -Per ID will defer on doing TEE and will treat for 6 weeks regardless -PT Recommending SNF but patient and family refusing and still wanting to go home with Home Health   CKD stage III -Stable. Baseline of 1.2 -BUN/Cr Stable at 16/1.06 -Avoid Nephrotoxic medications if possible; Restarted Home Lasix so will need to watch for rise in Cr -Repeat CMP in AM   Essential Hypertension -Controlled; BP was 150/43  -Continue w/ Carvedilol 6.25 mg po BID  -Added back Lasix 40 mg po Daily  -Will add IV Hydralazine if necessary   Hypothyroidism -Checked TSH and was 3.397  -Continue Levothyroxine 88 mcg po Daily   CAD s/p DES x2 in 2017 -Continue ASA 81 mg po Daily, Atorvastatin 20 mg po daily, and Carvedilol 6.25 mg po BID  -Will hold Atorvastatin given elevation in LFT's as patient is on Daptomycin and has a higher risk for Rhabodmyolysis   Atrial Fibrillation -Suspect is Paroxysmal -C/w Carvedilol 6.25 mg po BID  -Continue Apixaban 2.5 mg po BID   Suspected UTI -Urinalysis showed Hazy Color Urine, Moderate Hgb, Small Leukocytes, and 6-30 WBC's  -Urine culture showed 60,000 CFU of E. Faecalis; ? Colonization -IV Ceftriaxone was discontinued by Infectious Diseases -IV Vancomycin transitioned to IV Daptomycin q48h by Infectious Disease and will likely cover E Facecalis  Elevated Troponin -POC Troponin was 0.05 and Troponin I was 0.06 and then 0.07 -Likely 2/2 to Bacteremia and Infection  -Continue To Monitor -Repeat TTE done today and showed EF  of 50-55% with Grade 2 DD -Denies any active CP  Lactic Acidosis, improved -Improved. LA went from 2.09 -> 3.37 -> 1.1 -> 1.0  Chronic Systolic and Diastolic CHF -Currently not decompensated  -Repeat ECHO 11/06/16 showed EF of 50-55% and Grade 2 DD -C/w Carvedilol 6.25 mg po BID -Monitor Strict  I's/O's, Daily Weights, and SLIV -Patient is + 1.440 L; Weight is Up 4 lbs since admission -C/w Home Lasix of 40 mg po Daily  -Continue to Monitor Volume Status  Abnormal LFT's -? Reactive from Abx in the Setting of Daptomycin -AST went from 33 -> 67 -> 80 -> 67 -> 58 -ALT went from 21 -> 40 -> 57 -> 59 -> 59 -RUQ U/S showed Surgically absent gallbladder with normal ultrasound appearance of the liver and no evidence of biliary obstruction.   -Acute Hepatitis Panel Negative -AST/ALT elevated but stable; Continue to Hold Statin given higher risk of Rhabdomyolysis because of Daptomycin   Normocytic Anemia -Likely in the setting of Chronic Kidney Disease -Hb/Hct stable at 11.5/35.6 -Continue to Monitor for S/Sx of Bleeding as patient is Anticoagulated with Apixaban -Repeat CBC in AM   Thrombocytopenia, Improved -? From Infection vs. Abx; Spontaneously started trending up -Improving. Platelet Count went from 112 ->  216 -Continue to Monitor and Repeat CBC in AM   DVT prophylaxis: SCD's; Anticoagulated with Apixaban Code Status: DO NOT RESUSCITATE Family Communication: Discussed with Daughter at bedside Disposition Plan: Home with Home Health when medically stable to D/C Home as patient adamantly refusing SNF  Consultants:   Infectious Diseases Dr. Jonny RuizJohn Campbell/ Dr. Merceda Elksob Comer   Procedures:  TRANSTHORACIC ECHOCARDIOGRAM 10/31/17 ------------------------------------------------------------------- Study Conclusions  - Left ventricle: Distal septal and apical hypokinesis Systolic   function was normal. The estimated ejection fraction was in the   range of 50% to 55%. - Aortic valve: There was mild regurgitation. - Mitral valve: Severely calcified annulus. Mildly thickened   leaflets . There was mild regurgitation. - Left atrium: The atrium was mildly dilated. - Right atrium: The atrium was mildly dilated. - Atrial septum: No defect or patent foramen ovale was  identified.  REPEAT TRANSTHORACIC ECHOCARDIOGRAM 11/06/17 ------------------------------------------------------------------- Study Conclusions  - Left ventricle: The cavity size was normal. Systolic function was   normal. The estimated ejection fraction was in the range of 50%   to 55%. Wall motion was normal; there were no regional wall   motion abnormalities. Features are consistent with a pseudonormal   left ventricular filling pattern, with concomitant abnormal   relaxation and increased filling pressure (grade 2 diastolic   dysfunction). Doppler parameters are consistent with elevated   ventricular end-diastolic filling pressure. - Aortic valve: Trileaflet; mildly thickened, mildly calcified   leaflets. There was moderate regurgitation. - Mitral valve: Calcified annulus. Moderately thickened, severely   calcified leaflets . There was mild regurgitation. - Left atrium: The atrium was moderately dilated. - Right ventricle: Systolic function was normal. - Tricuspid valve: There was mild regurgitation. - Pulmonary arteries: Systolic pressure was within the normal   range. - Inferior vena cava: The vessel was normal in size. - Pericardium, extracardiac: There was no pericardial effusion.  Impressions:  - There was no evidence of a vegetation.  Antimicrobials:  Anti-infectives (From admission, onward)   Start     Dose/Rate Route Frequency Ordered Stop   11/06/17 1230  DAPTOmycin (CUBICIN) 500 mg in sodium chloride 0.9 % IVPB  Status:  Discontinued     500 mg 220 mL/hr over 30 Minutes Intravenous  Every 24 hours 11/06/17 1018 11/06/17 1033   11/06/17 1200  vancomycin (VANCOCIN) 500 mg in sodium chloride 0.9 % 100 mL IVPB  Status:  Discontinued     500 mg 100 mL/hr over 60 Minutes Intravenous Every 24 hours 11/05/17 1046 11/06/17 1018   11/06/17 1200  DAPTOmycin (CUBICIN) 500 mg in sodium chloride 0.9 % IVPB     500 mg 220 mL/hr over 30 Minutes Intravenous Every 48 hours  11/06/17 1033     11/05/17 2200  cefTRIAXone (ROCEPHIN) 1 g in dextrose 5 % 50 mL IVPB  Status:  Discontinued     1 g 100 mL/hr over 30 Minutes Intravenous Every 24 hours 11/05/17 0036 11/05/17 1446   11/05/17 1045  vancomycin (VANCOCIN) 1,250 mg in sodium chloride 0.9 % 250 mL IVPB     1,250 mg 166.7 mL/hr over 90 Minutes Intravenous  Once 11/05/17 1040 11/05/17 1233   11/04/17 2145  cefTRIAXone (ROCEPHIN) 1 g in dextrose 5 % 50 mL IVPB     1 g 100 mL/hr over 30 Minutes Intravenous  Once 11/04/17 2133 11/04/17 2251     Subjective: Seen and examined at and had no complaints and felt good. Wanting to know when she can go home. No CP or SOB. No other concerns or complaints at this time.   Objective: Vitals:   11/09/17 0622 11/09/17 1500 11/09/17 2006 11/10/17 0534  BP: 135/68 (!) 117/43 (!) 132/50 (!) 150/43  Pulse: 66 68  72  Resp: 18 18 18 18   Temp: 98.3 F (36.8 C) 98.4 F (36.9 C) 97.9 F (36.6 C) 98.5 F (36.9 C)  TempSrc: Oral Oral Oral Oral  SpO2: 93% 96% 98% 95%  Weight: 61.5 kg (135 lb 9.3 oz)   61.3 kg (135 lb 1.6 oz)  Height:        Intake/Output Summary (Last 24 hours) at 11/10/2017 0746 Last data filed at 11/09/2017 1300 Gross per 24 hour  Intake 720 ml  Output 300 ml  Net 420 ml   Filed Weights   11/08/17 0521 11/09/17 0622 11/10/17 0534  Weight: 60.8 kg (134 lb 0.6 oz) 61.5 kg (135 lb 9.3 oz) 61.3 kg (135 lb 1.6 oz)   Examination: Physical Exam:  Constitutional: WN/WD Elderly Caucasian female in NAD; Appears calm and comfortable sitting in chair bedside  Eyes: Sclerae anicteric. Lids Normal ENMT: External Ears and Nose appear normal. MMM Neck: Supple with no JVD Respiratory: Diminished but no appreciable wheezing/rales/rhonchi. Unlabored breathing Cardiovascular: RRR; No appreciable LE edema Abdomen: Soft, NT, ND. Bowel sounds present GU: Deferred Musculoskeletal: No contractures; No cyanosis Skin: Warm and dry. No appreciable rashes on a limited skin  evaluation Neurologic: CN 2-12 grossly intact. No appreciable focal deficits Psychiatric: Normal mood and affect. Intact judgement and insight  Data Reviewed: I have personally reviewed following labs and imaging studies  CBC: Recent Labs  Lab 11/04/17 1807 11/05/17 0332 11/06/17 0824 11/07/17 0619 11/08/17 0558 11/09/17 0835  WBC 8.1 9.6 10.3 7.8 6.5 8.1  NEUTROABS 6.6  --  8.3* 5.3 3.7 5.6  HGB 10.9* 10.7* 11.7* 10.0* 10.6* 11.1*  HCT 34.0* 32.9* 36.2 31.7* 32.9* 35.0*  MCV 95.0 94.8 96.5 96.1 95.6 95.9  PLT 112* 98* 112* 112* 145* 197   Basic Metabolic Panel: Recent Labs  Lab 11/05/17 0332 11/06/17 0824 11/07/17 0619 11/08/17 0558 11/09/17 0835  NA 137 138 136 138 138  K 3.6 4.1 3.9 3.6 3.8  CL 101 102 102 103 102  CO2 23  24 24 25 23   GLUCOSE 160* 140* 119* 121* 163*  BUN 20 20 17 17 15   CREATININE 1.20* 1.27* 1.09* 1.12* 1.09*  CALCIUM 8.0* 8.6* 8.2* 8.6* 8.5*  MG  --  2.0 2.0 2.0 1.9  PHOS  --  2.9 2.8 3.6 3.1   GFR: Estimated Creatinine Clearance: 29 mL/min (A) (by C-G formula based on SCr of 1.09 mg/dL (H)). Liver Function Tests: Recent Labs  Lab 11/04/17 1807 11/06/17 0824 11/07/17 0619 11/08/17 0558 11/09/17 0835  AST 42* 33 67* 80* 67*  ALT 24 21 40 57* 59*  ALKPHOS 46 49 45 48 60  BILITOT 1.1 1.1 0.8 0.9 1.0  PROT 6.2* 6.3* 5.4* 5.7* 6.4*  ALBUMIN 2.8* 2.8* 2.4* 2.5* 2.7*   Recent Labs  Lab 11/04/17 1807  LIPASE 55*   No results for input(s): AMMONIA in the last 168 hours. Coagulation Profile: No results for input(s): INR, PROTIME in the last 168 hours. Cardiac Enzymes: Recent Labs  Lab 11/05/17 0332 11/05/17 0640 11/06/17 0824  CKTOTAL  --   --  90  TROPONINI 0.06* 0.07*  --    BNP (last 3 results) No results for input(s): PROBNP in the last 8760 hours. HbA1C: No results for input(s): HGBA1C in the last 72 hours. CBG: No results for input(s): GLUCAP in the last 168 hours. Lipid Profile: No results for input(s): CHOL, HDL,  LDLCALC, TRIG, CHOLHDL, LDLDIRECT in the last 72 hours. Thyroid Function Tests: Recent Labs    11/08/17 0558  TSH 3.397   Anemia Panel: No results for input(s): VITAMINB12, FOLATE, FERRITIN, TIBC, IRON, RETICCTPCT in the last 72 hours. Sepsis Labs: Recent Labs  Lab 11/04/17 1828 11/04/17 2145 11/05/17 0332 11/05/17 0640  LATICACIDVEN 2.09* 3.37* 1.1 1.0    Recent Results (from the past 240 hour(s))  Culture, blood (routine x 2)     Status: Abnormal   Collection Time: 11/04/17  6:08 PM  Result Value Ref Range Status   Specimen Description BLOOD LEFT UPPER ARM  Final   Special Requests   Final    BOTTLES DRAWN AEROBIC AND ANAEROBIC Blood Culture adequate volume   Culture  Setup Time   Final    GRAM POSITIVE COCCI IN BOTH AEROBIC AND ANAEROBIC BOTTLES CRITICAL RESULT CALLED TO, READ BACK BY AND VERIFIED WITH: Patrick North PHARMD 11/05/17 1037 A JW    Culture (A)  Final    STAPHYLOCOCCUS AUREUS SUSCEPTIBILITIES PERFORMED ON PREVIOUS CULTURE WITHIN THE LAST 5 DAYS.    Report Status 11/07/2017 FINAL  Final  Culture, blood (routine x 2)     Status: Abnormal   Collection Time: 11/04/17  6:20 PM  Result Value Ref Range Status   Specimen Description BLOOD RIGHT ARM  Final   Special Requests   Final    BOTTLES DRAWN AEROBIC AND ANAEROBIC Blood Culture adequate volume   Culture  Setup Time   Final    GRAM POSITIVE COCCI IN BOTH AEROBIC AND ANAEROBIC BOTTLES CRITICAL RESULT CALLED TO, READ BACK BY AND VERIFIED WITH: Patrick North PHARMD 11/05/17 1037 A JW    Culture METHICILLIN RESISTANT STAPHYLOCOCCUS AUREUS (A)  Final   Report Status 11/07/2017 FINAL  Final   Organism ID, Bacteria METHICILLIN RESISTANT STAPHYLOCOCCUS AUREUS  Final      Susceptibility   Methicillin resistant staphylococcus aureus - MIC*    CIPROFLOXACIN <=0.5 SENSITIVE Sensitive     ERYTHROMYCIN >=8 RESISTANT Resistant     GENTAMICIN <=0.5 SENSITIVE Sensitive     OXACILLIN >=4 RESISTANT  Resistant      TETRACYCLINE <=1 SENSITIVE Sensitive     VANCOMYCIN 1 SENSITIVE Sensitive     TRIMETH/SULFA <=10 SENSITIVE Sensitive     CLINDAMYCIN <=0.25 SENSITIVE Sensitive     RIFAMPIN <=0.5 SENSITIVE Sensitive     Inducible Clindamycin NEGATIVE Sensitive     * METHICILLIN RESISTANT STAPHYLOCOCCUS AUREUS  Blood Culture ID Panel (Reflexed)     Status: Abnormal   Collection Time: 11/04/17  6:20 PM  Result Value Ref Range Status   Enterococcus species NOT DETECTED NOT DETECTED Final   Listeria monocytogenes NOT DETECTED NOT DETECTED Final   Staphylococcus species DETECTED (A) NOT DETECTED Final    Comment: CRITICAL RESULT CALLED TO, READ BACK BY AND VERIFIED WITH: Patrick North PHARMD 11/05/17 1037 A JW    Staphylococcus aureus DETECTED (A) NOT DETECTED Final    Comment: Methicillin (oxacillin)-resistant Staphylococcus aureus (MRSA). MRSA is predictably resistant to beta-lactam antibiotics (except ceftaroline). Preferred therapy is vancomycin unless clinically contraindicated. Patient requires contact precautions if  hospitalized. CRITICAL RESULT CALLED TO, READ BACK BY AND VERIFIED WITH: Patrick North PHARMD 11/05/17 1037 A JW    Methicillin resistance DETECTED (A) NOT DETECTED Final    Comment: CRITICAL RESULT CALLED TO, READ BACK BY AND VERIFIED WITH: Patrick North PHARMD 11/05/17 1037 A JW    Streptococcus species NOT DETECTED NOT DETECTED Final   Streptococcus agalactiae NOT DETECTED NOT DETECTED Final   Streptococcus pneumoniae NOT DETECTED NOT DETECTED Final   Streptococcus pyogenes NOT DETECTED NOT DETECTED Final   Acinetobacter baumannii NOT DETECTED NOT DETECTED Final   Enterobacteriaceae species NOT DETECTED NOT DETECTED Final   Enterobacter cloacae complex NOT DETECTED NOT DETECTED Final   Escherichia coli NOT DETECTED NOT DETECTED Final   Klebsiella oxytoca NOT DETECTED NOT DETECTED Final   Klebsiella pneumoniae NOT DETECTED NOT DETECTED Final   Proteus species NOT DETECTED NOT  DETECTED Final   Serratia marcescens NOT DETECTED NOT DETECTED Final   Haemophilus influenzae NOT DETECTED NOT DETECTED Final   Neisseria meningitidis NOT DETECTED NOT DETECTED Final   Pseudomonas aeruginosa NOT DETECTED NOT DETECTED Final   Candida albicans NOT DETECTED NOT DETECTED Final   Candida glabrata NOT DETECTED NOT DETECTED Final   Candida krusei NOT DETECTED NOT DETECTED Final   Candida parapsilosis NOT DETECTED NOT DETECTED Final   Candida tropicalis NOT DETECTED NOT DETECTED Final  Urine culture     Status: Abnormal   Collection Time: 11/04/17  9:32 PM  Result Value Ref Range Status   Specimen Description URINE, RANDOM  Final   Special Requests NONE  Final   Culture 60,000 COLONIES/mL ENTEROCOCCUS FAECALIS (A)  Final   Report Status 11/07/2017 FINAL  Final   Organism ID, Bacteria ENTEROCOCCUS FAECALIS (A)  Final      Susceptibility   Enterococcus faecalis - MIC*    AMPICILLIN <=2 SENSITIVE Sensitive     LEVOFLOXACIN 1 SENSITIVE Sensitive     NITROFURANTOIN <=16 SENSITIVE Sensitive     VANCOMYCIN 1 SENSITIVE Sensitive     * 60,000 COLONIES/mL ENTEROCOCCUS FAECALIS  Culture, blood (routine x 2)     Status: Abnormal   Collection Time: 11/06/17  8:35 AM  Result Value Ref Range Status   Specimen Description BLOOD LEFT HAND  Final   Special Requests IN PEDIATRIC BOTTLE Blood Culture adequate volume  Final   Culture  Setup Time   Final    GRAM POSITIVE COCCI IN PEDIATRIC BOTTLE CRITICAL VALUE NOTED.  VALUE IS CONSISTENT WITH PREVIOUSLY REPORTED  AND CALLED VALUE.    Culture (A)  Final    STAPHYLOCOCCUS AUREUS SUSCEPTIBILITIES PERFORMED ON PREVIOUS CULTURE WITHIN THE LAST 5 DAYS.    Report Status 11/08/2017 FINAL  Final  Culture, blood (routine x 2)     Status: Abnormal   Collection Time: 11/06/17  8:43 AM  Result Value Ref Range Status   Specimen Description BLOOD LEFT ARM  Final   Special Requests IN PEDIATRIC BOTTLE Blood Culture adequate volume  Final   Culture   Setup Time   Final    GRAM POSITIVE COCCI IN PEDIATRIC BOTTLE CRITICAL VALUE NOTED.  VALUE IS CONSISTENT WITH PREVIOUSLY REPORTED AND CALLED VALUE.    Culture (A)  Final    STAPHYLOCOCCUS AUREUS SUSCEPTIBILITIES PERFORMED ON PREVIOUS CULTURE WITHIN THE LAST 5 DAYS.    Report Status 11/08/2017 FINAL  Final  Culture, blood (routine x 2)     Status: None (Preliminary result)   Collection Time: 11/08/17  6:00 AM  Result Value Ref Range Status   Specimen Description BLOOD LEFT HAND  Final   Special Requests IN PEDIATRIC BOTTLE Blood Culture adequate volume  Final   Culture NO GROWTH 1 DAY  Final   Report Status PENDING  Incomplete  Culture, blood (routine x 2)     Status: None (Preliminary result)   Collection Time: 11/08/17  6:05 AM  Result Value Ref Range Status   Specimen Description BLOOD RIGHT HAND  Final   Special Requests IN PEDIATRIC BOTTLE Blood Culture adequate volume  Final   Culture NO GROWTH 1 DAY  Final   Report Status PENDING  Incomplete    Radiology Studies: US Abdomen Limited Ruq  Result Date: 11/09/2017 CLINICAL DATA:  84 year old female with abnormal LFTs. EXAM: ULTRASOUND ABDOMEN LIMITED RIGHT UPPER QUADRANT COMPARISON:  CT Abdomen and Pelvis without contrast 11/04/2017 FINDINGS: Gallbladder: Surgically absent. Common bile duct: Diameter: 6 millimeters, normal. Liver: Liver echogenicity within normal limits. No intrahepatic biliary ductal dilatation or discrete liver lesion. Portal vein is patent on color Doppler imaging with normal direction of blood flow towards the liver (image 25). Other findings: Stable visible right kidney including simple appearing right upper pole cyst. IMPRESSION: Surgically absent gallbladder with normal ultrasound appearance of the liver and no evidence of biliary obstruction. Electronically Signed   By: Odessa Fleming M.D.   On: 11/09/2017 07:22   Scheduled Meds: . apixaban  2.5 mg Oral BID  . aspirin EC  81 mg Oral Q M,W,F  . carvedilol  6.25 mg  Oral BID WC  . furosemide  40 mg Oral Daily  . levothyroxine  88 mcg Oral QAC breakfast  . multivitamin  1 tablet Oral Daily   Continuous Infusions: . DAPTOmycin (CUBICIN)  IV Stopped (11/08/17 1505)    LOS: 5 days   Merlene Laughter, DO [facility] Pager [phone]  If 7PM-7AM, please contact night-coverage [email or web address] Password [facility] 11/10/2017, 7:46 AM

## 2017-11-11 LAB — COMPREHENSIVE METABOLIC PANEL
ALBUMIN: 2.4 g/dL — AB (ref 3.5–5.0)
ALK PHOS: 50 U/L (ref 38–126)
ALT: 52 U/L (ref 14–54)
AST: 48 U/L — AB (ref 15–41)
Anion gap: 10 (ref 5–15)
BILIRUBIN TOTAL: 0.6 mg/dL (ref 0.3–1.2)
BUN: 16 mg/dL (ref 6–20)
CO2: 26 mmol/L (ref 22–32)
CREATININE: 1.07 mg/dL — AB (ref 0.44–1.00)
Calcium: 8.4 mg/dL — ABNORMAL LOW (ref 8.9–10.3)
Chloride: 104 mmol/L (ref 101–111)
GFR calc Af Amer: 51 mL/min — ABNORMAL LOW (ref >60)
GFR, EST NON AFRICAN AMERICAN: 44 mL/min — AB (ref >60)
GLUCOSE: 116 mg/dL — AB (ref 65–99)
Potassium: 3.3 mmol/L — ABNORMAL LOW (ref 3.5–5.1)
Sodium: 140 mmol/L (ref 135–145)
TOTAL PROTEIN: 5.8 g/dL — AB (ref 6.5–8.1)

## 2017-11-11 LAB — CBC WITH DIFFERENTIAL/PLATELET
BASOS ABS: 0 10*3/uL (ref 0.0–0.1)
Basophils Relative: 0 %
Eosinophils Absolute: 0.3 10*3/uL (ref 0.0–0.7)
Eosinophils Relative: 4 %
HEMATOCRIT: 33.2 % — AB (ref 36.0–46.0)
HEMOGLOBIN: 10.5 g/dL — AB (ref 12.0–15.0)
LYMPHS PCT: 28 %
Lymphs Abs: 2.1 10*3/uL (ref 0.7–4.0)
MCH: 30.3 pg (ref 26.0–34.0)
MCHC: 31.6 g/dL (ref 30.0–36.0)
MCV: 96 fL (ref 78.0–100.0)
MONO ABS: 1 10*3/uL (ref 0.1–1.0)
Monocytes Relative: 13 %
NEUTROS ABS: 4 10*3/uL (ref 1.7–7.7)
NEUTROS PCT: 55 %
Platelets: 220 10*3/uL (ref 150–400)
RBC: 3.46 MIL/uL — AB (ref 3.87–5.11)
RDW: 13.7 % (ref 11.5–15.5)
WBC: 7.4 10*3/uL (ref 4.0–10.5)

## 2017-11-11 LAB — MAGNESIUM: MAGNESIUM: 1.8 mg/dL (ref 1.7–2.4)

## 2017-11-11 LAB — PHOSPHORUS: Phosphorus: 3.6 mg/dL (ref 2.5–4.6)

## 2017-11-11 MED ORDER — SODIUM CHLORIDE 0.9% FLUSH: 10.0000 mL | INTRAVENOUS | Status: DC | PRN

## 2017-11-11 MED ORDER — POTASSIUM CHLORIDE CRYS ER 20 MEQ PO TBCR
40.0000 meq | EXTENDED_RELEASE_TABLET | Freq: Two times a day (BID) | ORAL | Status: DC
  Administered 2017-11-11: 40 meq via ORAL
  Filled 2017-11-11: qty 2

## 2017-11-11 MED ORDER — DAPTOMYCIN IV (FOR PTA / DISCHARGE USE ONLY): 500.0000 mg | INTRAVENOUS | 0 refills | Status: DC

## 2017-11-11 NOTE — Progress Notes (Signed)
Pt has orders to be discharged. Discharge instructions given and pt has no additional questions at this time. Medication regimen reviewed and pt educated. Pt verbalized understanding and has no additional questions. Telemetry box removed. IV removed and sites in good condition. PICC line intact. Pt stable and waiting for transportation.

## 2017-11-11 NOTE — Progress Notes (Signed)
[facility]; She is to go home with IV antibiotics provided by [facility]; Pam with [facility] made aware of planned dc home today, [facility] made aware also; Alexis GoodellB Leianne Callins RN,MHA,BSN [phone]

## 2017-11-11 NOTE — Progress Notes (Signed)
Patient declines chair alarm. Education given. Daughter at bedside.

## 2017-11-11 NOTE — Progress Notes (Signed)
PHARMACY CONSULT NOTE FOR:  OUTPATIENT  PARENTERAL ANTIBIOTIC THERAPY (OPAT)  Indication: MRSA bacteremia Regimen: Daptomycin 500 mg iv Q 48 hours starting 11/12/17 End date: 12/21/17  IV antibiotic discharge orders are pended. To discharging provider:  please sign these orders via discharge navigator,  Select New Orders & click on the button choice - Manage This Unsigned Work.     Thank you for allowing pharmacy to be a part of this patient's care.  Elwin Sleightowell, Addyson Traub Kay 11/11/2017, 10:29 AM

## 2017-11-11 NOTE — Progress Notes (Signed)
Peripherally Inserted Central Catheter/Midline Placement  The IV Nurse has discussed with the patient and/or persons authorized to consent for the patient, the purpose of this procedure and the potential benefits and risks involved with this procedure.  The benefits include less needle sticks, lab draws from the catheter, and the patient may be discharged home with the catheter. Risks include, but not limited to, infection, bleeding, blood clot (thrombus formation), and puncture of an artery; nerve damage and irregular heartbeat and possibility to perform a PICC exchange if needed/ordered by physician.  Alternatives to this procedure were also discussed.  Bard Power PICC patient education guide, fact sheet on infection prevention and patient information card has been provided to patient /or left at bedside.    PICC/Midline Placement Documentation  PICC Single Lumen 11/11/17 PICC Right Basilic 38 cm 1 cm (Active)  Indication for Insertion or Continuance of Line Home intravenous therapies (PICC only) 11/11/2017 12:38 PM  Exposed Catheter (cm) 1 cm 11/11/2017 12:38 PM  Site Assessment Clean;Dry;Intact 11/11/2017 12:38 PM  Line Status Flushed;Saline locked;Blood return noted 11/11/2017 12:38 PM  Dressing Type Transparent;Securing device 11/11/2017 12:38 PM  Dressing Status Clean;Dry;Intact;Antimicrobial disc in place 11/11/2017 12:38 PM  Dressing Change Due 11/18/17 11/11/2017 12:38 PM   PICC placed by Stacie Glazeobin Joyce RN    Chelsea Fuller, Chelsea Fuller 11/11/2017, 12:42 PM

## 2017-11-11 NOTE — Progress Notes (Signed)
[facility]  Date of Admission:  11/04/2017   Reason for Visit: FU on MRSA Bacteremia     Total days of antibiotics 8  Day 6 daptomycin          Patient ID: Chelsea Fuller is a 84 y.o. female with  Principal Problem:   MRSA bacteremia Active Problems:   Septic thrombophlebitis of upper extremity   CKD (chronic kidney disease), stage III (HCC)   Anemia   Ischemic cardiomyopathy   CAD (coronary artery disease)-DES/LAD 2017   Hypertension   Hypothyroidism, adult   Permanent atrial fibrillation (HCC)   Weakness generalized   Abnormal LFTs   Thrombocytopenia (HCC)   Normocytic anemia   . apixaban  2.5 mg Oral BID  . aspirin EC  81 mg Oral Q M,W,F  . carvedilol  6.25 mg Oral BID WC  . furosemide  40 mg Oral Daily  . levothyroxine  88 mcg Oral QAC breakfast  . multivitamin  1 tablet Oral Daily  . potassium chloride  40 mEq Oral BID WC    Interval History:  Afebrile over the weekend and normal WBC count.    No Known Allergies  OBJECTIVE: Vitals:   11/10/17 0534 11/10/17 0825 11/10/17 2121 11/11/17 0438  BP: (!) 150/43 130/80 132/72 (!) 145/69  Pulse: 72 70 (!) 59 72  Resp: 18 18 18 20   Temp: 98.5 F (36.9 C) 98 F (36.7 C) 98.2 F (36.8 C) 98 F (36.7 C)  TempSrc: Oral Oral Oral Oral  SpO2: 95% 95% 97% 100%  Weight: 135 lb 1.6 oz (61.3 kg)   135 lb 1.6 oz (61.3 kg)  Height:       Body mass index is 23.19 kg/m.  Physical Exam  Constitutional: She is oriented to person, place, and time and well-developed, well-nourished, and in no distress.  HENT:  Mouth/Throat: No oral lesions. No dental abscesses.  Eyes: No scleral icterus.  Cardiovascular: Normal rate, regular rhythm and normal heart sounds.  Pulmonary/Chest: Effort normal and breath sounds normal.  Abdominal: Soft. She exhibits no distension. There is no tenderness.  Musculoskeletal: Normal range of motion. She exhibits no tenderness.  Lymphadenopathy:    She  has no cervical adenopathy.  Neurological: She is alert and oriented to person, place, and time.  Skin: Skin is warm and dry. No rash noted.  Psychiatric: Mood and affect normal.  Vitals reviewed.   Lab Results Lab Results  Component Value Date   WBC 7.4 11/11/2017   HGB 10.5 (L) 11/11/2017   HCT 33.2 (L) 11/11/2017   MCV 96.0 11/11/2017   PLT 220 11/11/2017    Lab Results  Component Value Date   CREATININE 1.07 (H) 11/11/2017   BUN 16 11/11/2017   NA 140 11/11/2017   K 3.3 (L) 11/11/2017   CL 104 11/11/2017   CO2 26 11/11/2017    Lab Results  Component Value Date   ALT 52 11/11/2017   AST 48 (H) 11/11/2017   ALKPHOS 50 11/11/2017   BILITOT 0.6 11/11/2017     Microbiology: Recent Results (from the past 240 hour(s))  Culture, blood (routine x 2)     Status: Abnormal   Collection Time: 11/04/17  6:08 PM  Result Value Ref Range Status   Specimen Description BLOOD LEFT UPPER ARM  Final   Special Requests   Final    BOTTLES DRAWN AEROBIC AND ANAEROBIC Blood Culture adequate volume   Culture  Setup Time   Final    GRAM POSITIVE COCCI IN BOTH AEROBIC AND ANAEROBIC BOTTLES CRITICAL RESULT CALLED TO, READ BACK BY AND VERIFIED WITH: Patrick North PHARMD 11/05/17 1037 A JW    Culture (A)  Final    STAPHYLOCOCCUS AUREUS SUSCEPTIBILITIES PERFORMED ON PREVIOUS CULTURE WITHIN THE LAST 5 DAYS.    Report Status 11/07/2017 FINAL  Final  Culture, blood (routine x 2)     Status: Abnormal   Collection Time: 11/04/17  6:20 PM  Result Value Ref Range Status   Specimen Description BLOOD RIGHT ARM  Final   Special Requests   Final    BOTTLES DRAWN AEROBIC AND ANAEROBIC Blood Culture adequate volume   Culture  Setup Time   Final    GRAM POSITIVE COCCI IN BOTH AEROBIC AND ANAEROBIC BOTTLES CRITICAL RESULT CALLED TO, READ BACK BY AND VERIFIED WITH: Patrick North PHARMD 11/05/17 1037 A JW    Culture METHICILLIN RESISTANT STAPHYLOCOCCUS AUREUS (A)  Final   Report Status 11/07/2017  FINAL  Final   Organism ID, Bacteria METHICILLIN RESISTANT STAPHYLOCOCCUS AUREUS  Final      Susceptibility   Methicillin resistant staphylococcus aureus - MIC*    CIPROFLOXACIN <=0.5 SENSITIVE Sensitive     ERYTHROMYCIN >=8 RESISTANT Resistant     GENTAMICIN <=0.5 SENSITIVE Sensitive     OXACILLIN >=4 RESISTANT Resistant     TETRACYCLINE <=1 SENSITIVE Sensitive     VANCOMYCIN 1 SENSITIVE Sensitive     TRIMETH/SULFA <=10 SENSITIVE Sensitive     CLINDAMYCIN <=0.25 SENSITIVE Sensitive     RIFAMPIN <=0.5 SENSITIVE Sensitive     Inducible Clindamycin NEGATIVE Sensitive     * METHICILLIN RESISTANT STAPHYLOCOCCUS AUREUS  Blood Culture ID Panel (Reflexed)     Status: Abnormal   Collection Time: 11/04/17  6:20 PM  Result Value Ref Range Status   Enterococcus species NOT DETECTED NOT DETECTED Final   Listeria monocytogenes NOT DETECTED NOT DETECTED Final   Staphylococcus species DETECTED (A) NOT DETECTED Final    Comment: CRITICAL RESULT CALLED TO, READ BACK BY AND VERIFIED WITH: Patrick North PHARMD 11/05/17 1037 A JW    Staphylococcus aureus DETECTED (A) NOT DETECTED Final    Comment: Methicillin (oxacillin)-resistant Staphylococcus aureus (MRSA). MRSA is predictably resistant to beta-lactam antibiotics (except ceftaroline). Preferred therapy is vancomycin unless clinically contraindicated. Patient requires contact precautions if  hospitalized. CRITICAL RESULT CALLED TO, READ BACK BY AND VERIFIED WITH: Patrick North PHARMD 11/05/17 1037 A JW    Methicillin resistance DETECTED (A) NOT DETECTED Final    Comment: CRITICAL RESULT CALLED TO, READ BACK BY AND VERIFIED WITH: Patrick North PHARMD 11/05/17 1037 A JW    Streptococcus species NOT DETECTED NOT DETECTED Final   Streptococcus agalactiae NOT DETECTED NOT DETECTED Final   Streptococcus pneumoniae NOT DETECTED NOT DETECTED Final   Streptococcus pyogenes NOT DETECTED NOT DETECTED Final   Acinetobacter baumannii NOT DETECTED NOT DETECTED  Final   Enterobacteriaceae species NOT DETECTED NOT DETECTED Final   Enterobacter cloacae complex NOT DETECTED NOT DETECTED Final   Escherichia coli NOT DETECTED NOT DETECTED Final   Klebsiella oxytoca NOT DETECTED NOT DETECTED Final   Klebsiella pneumoniae NOT DETECTED NOT DETECTED Final   Proteus species NOT DETECTED NOT DETECTED Final   Serratia marcescens NOT DETECTED NOT DETECTED Final   Haemophilus influenzae NOT DETECTED NOT DETECTED Final   Neisseria meningitidis NOT DETECTED NOT DETECTED Final   Pseudomonas aeruginosa NOT DETECTED NOT DETECTED Final   Candida albicans NOT DETECTED NOT DETECTED  Final   Candida glabrata NOT DETECTED NOT DETECTED Final   Candida krusei NOT DETECTED NOT DETECTED Final   Candida parapsilosis NOT DETECTED NOT DETECTED Final   Candida tropicalis NOT DETECTED NOT DETECTED Final  Urine culture     Status: Abnormal   Collection Time: 11/04/17  9:32 PM  Result Value Ref Range Status   Specimen Description URINE, RANDOM  Final   Special Requests NONE  Final   Culture 60,000 COLONIES/mL ENTEROCOCCUS FAECALIS (A)  Final   Report Status 11/07/2017 FINAL  Final   Organism ID, Bacteria ENTEROCOCCUS FAECALIS (A)  Final      Susceptibility   Enterococcus faecalis - MIC*    AMPICILLIN <=2 SENSITIVE Sensitive     LEVOFLOXACIN 1 SENSITIVE Sensitive     NITROFURANTOIN <=16 SENSITIVE Sensitive     VANCOMYCIN 1 SENSITIVE Sensitive     * 60,000 COLONIES/mL ENTEROCOCCUS FAECALIS  Culture, blood (routine x 2)     Status: Abnormal   Collection Time: 11/06/17  8:35 AM  Result Value Ref Range Status   Specimen Description BLOOD LEFT HAND  Final   Special Requests IN PEDIATRIC BOTTLE Blood Culture adequate volume  Final   Culture  Setup Time   Final    GRAM POSITIVE COCCI IN PEDIATRIC BOTTLE CRITICAL VALUE NOTED.  VALUE IS CONSISTENT WITH PREVIOUSLY REPORTED AND CALLED VALUE.    Culture (A)  Final    STAPHYLOCOCCUS AUREUS SUSCEPTIBILITIES PERFORMED ON PREVIOUS  CULTURE WITHIN THE LAST 5 DAYS.    Report Status 11/08/2017 FINAL  Final  Culture, blood (routine x 2)     Status: Abnormal   Collection Time: 11/06/17  8:43 AM  Result Value Ref Range Status   Specimen Description BLOOD LEFT ARM  Final   Special Requests IN PEDIATRIC BOTTLE Blood Culture adequate volume  Final   Culture  Setup Time   Final    GRAM POSITIVE COCCI IN PEDIATRIC BOTTLE CRITICAL VALUE NOTED.  VALUE IS CONSISTENT WITH PREVIOUSLY REPORTED AND CALLED VALUE.    Culture (A)  Final    STAPHYLOCOCCUS AUREUS SUSCEPTIBILITIES PERFORMED ON PREVIOUS CULTURE WITHIN THE LAST 5 DAYS.    Report Status 11/08/2017 FINAL  Final  Culture, blood (routine x 2)     Status: None (Preliminary result)   Collection Time: 11/08/17  6:00 AM  Result Value Ref Range Status   Specimen Description BLOOD LEFT HAND  Final   Special Requests IN PEDIATRIC BOTTLE Blood Culture adequate volume  Final   Culture NO GROWTH 2 DAYS  Final   Report Status PENDING  Incomplete  Culture, blood (routine x 2)     Status: None (Preliminary result)   Collection Time: 11/08/17  6:05 AM  Result Value Ref Range Status   Specimen Description BLOOD RIGHT HAND  Final   Special Requests IN PEDIATRIC BOTTLE Blood Culture adequate volume  Final   Culture NO GROWTH 2 DAYS  Final   Report Status PENDING  Incomplete   ASSESSMENT: MRSA bacteremia likely due to septic thrombophlebitis. Persistently positive blood cultures. TTE negative.   High risk medication continuous monitoring monitoring  PLAN: 1. Called and d/w micro lab and blood cultures remain without growth now on day 3. Will place orders for PICC line.  2. Right AC peripheral IV site appears to be infiltrated - discussed with her nurse today and will evaluate for removal.  3. Continue Daptomycin every other day x 6 weeks with Day one 11/08/17.  4. Baseline CK 90 next check due  1/23 for weekly monitoring  5. Discharge planning - planning on going home with family and  Home Health assistance. Her daughter has been fully trained on administration of IV abx at home. OPAT orders placed and are listed below.  6. We will arrange for outpatient follow up in ID clinic in 2 - 3 weeks considering her age and to closely follow her kidney function.  7. Will need repeat blood cultures 1 week after she completes therapy.   OPAT ORDERS:  Diagnosis: MRSA Bacteremia, septic thrombophlebitis   No Known Allergies  Discharge antibiotics: Daptomycin 500 mg IV q48h   Duration: 6 weeks  End Date: December 20, 2016  [facility] Per Protocol _x_ Please pull PIC at completion of IV antibiotics   Labs weekly while on IV antibiotics: _x_ CBC with differential _x_ BMP _x_ CK   Fax weekly labs to [phone]) [phone]  Clinic Follow Up Appt: Dr. Luciana Axeomer or Judeth CornfieldStephanie    @ [facility] 2 - 3 weeks   Rexene AlbertsStephanie Malak Duchesneau, MSN, NP-C [facility] Cell: [phone] Pager: [phone]  11/11/2017  10:39 AM

## 2017-11-11 NOTE — Discharge Summary (Signed)
Physician Discharge Summary  Chelsea Fuller IRS:[id number] DOB: August 08, 1924 DOA: 11/04/2017  PCP: Levin Erp, MD  Admit date: 11/04/2017 Discharge date: 11/11/2017  Admitted From: Home Disposition: Home with [facility] as patient is refusing SNF  Recommendations for Outpatient Follow-up:  1. Follow up with PCP in 1-2 weeks 2. Follow up with ID Clinic Dr. Linus Salmons or Janene Madeira in 2-3 weeks 3. Obtain Weekly Labs of CBC, BMP, and CK and Fax to ID Clinic 4. Please obtain CMP/CBC, Mag, Phos in one week 5. Please follow up on the following pending results:  Home Health: YES Equipment/Devices: None   Discharge Condition: Stable CODE STATUS: DO NOT RESUSCITATE Diet recommendation: Heart Healthy Diet with 1200 mL Fluid Restriction.   Brief/Interim Summary: Chelsea Frazee Baggettis a 84 y.o.femalewithhistory of chronic systolic and diastolic CHF, CAD status post PCI in 2017, chronic atrial fibrillation, chronic kidney disease, hypothyroidism presents to the ER with complaints of weakness. Patient was just recently discharged 4 days ago after being treated for acute CHF. Patient stated since discharge patient has been getting more fatigued and weak. Was admitted and found to have an MRSA Bacteremia. ID consulted and adjusted Abx from IV Vancomycin to IV Daptomycin. Patient continued to have persistently positive Blood Cx and suspected source is septic Thrombopheblitis. ID repeated Blood Cx on 11/08/17 and recommending placing PICC after 72 hours from Negative Blood Cx's for treatment of 6 weeks total after Negative Cx's. Patient's Blood Cx's on 11/08/17 finally came back Negative at 2 Day. ID Called Lab and Blood Cx were Negative at 3 days. PICC Line to be placed. Patient improved and cleared by ID to D/C Home with Home health as patient refused SNF.   Discharge Diagnoses:  Principal Problem:   MRSA bacteremia Active Problems:   Anemia   CKD (chronic kidney disease), stage III ([place])    Ischemic cardiomyopathy   CAD (coronary artery disease)-DES/LAD 2017   Hypertension   Hypothyroidism, adult   Permanent atrial fibrillation (HCC)   Weakness generalized   Septic thrombophlebitis of upper extremity   Abnormal LFTs   Thrombocytopenia (HCC)   Normocytic anemia  Acute MRSA Bacteremia from likely suspected Septic Thrombophlebitis of the Left Arm -Initial Blood Cx 2/2 bottles positive. Likely contributed to patient's overall malaise.  -Started Vancomycin and transitioned to IV Daptomycin by ID -Repeat Blood Cx 11/06/17 AM still Positive -ID recommendations appreciated  -Repeat Transthoracic Echocardiogram as below and showed no Vegetations  -Continue to Follow ID Recc's; -Blood Cx's repeated 11/08/17 AM showed NGTD at 2 days (ID Called Lab and showed NGTD at 3 Days); -Will place PICC now that Negative Blood Cx x 3 days and will need IV Abx for 6 weeks; OPAT Orderes placed and patient will need Daptomycin 500 mg IV q48h x 6 weeks with End Date on December 20, 2016 -Baseline CK was 90 and repeat due 1/23 -She will need repeat blood Cx 1 week after she completes Therapy -Per ID will defer on doing TEE and will treat for 6 weeks regardless -PT Recommending SNF but patient and family refusing and still wanting to go home with Home Health -Follow up with ID Clinic within 2-3 weeks ; Please Fax Weekly Labs of CBC with Diff, BMP, and CK to ID Clinic  -Follow up with PCP as an outpatient   CKD stage III -Stable. Baseline of 1.2 -BUN/Cr Stable at 16/1.07 -Avoid Nephrotoxic medications if possible; Restarted Home Lasix so will need to watch for rise in Cr -Repeat CMP as  an outpatient   Essential Hypertension -Controlled; BP was 150/43  -Continue w/ Carvedilol 6.25 mg po BID  -C/w Lasix 40 mg po Daily  -Follow up with PCP as an outpatient for further blood pressure titration and management   Hypothyroidism -Checked TSH and was 3.397  -Continue Levothyroxine 88 mcg po  Daily -Follow up with PCP for repeat TSH and Free T4   CAD s/p DES x2 in 2017 -Continue ASA 81 mg po Daily, Atorvastatin 20 mg po daily, and Carvedilol 6.25 mg po BID  -Will hold Atorvastatin given elevation in LFT's as patient is on Daptomycin and has a higher risk for Rhabdomyolysis -Follow up with PCP as an outpatient   Atrial Fibrillation -Suspect is Paroxysmal -C/w Carvedilol 6.25 mg po BID  -Continue Apixaban 2.5 mg po BID   Suspected UTI with E Faecalis  -Urinalysis showed Hazy Color Urine, Moderate Hgb, Small Leukocytes, and 6-30 WBC's  -Urine culture showed 60,000 CFU of E. Faecalis; ? Colonization -IV Ceftriaxone was discontinued by Infectious Diseases -IV Vancomycin transitioned to IV Daptomycin q48h by Infectious Disease and will likely cover E Facecalis -Follow up as an outpatient   Elevated Troponin -POC Troponin was 0.05 and Troponin I was 0.06 and then 0.07 -Likely 2/2 to Bacteremia and Infection  -Continue To Monitor -Repeat TTE done today and showed EF of 50-55% with Grade 2 DD -Denied any active CP  Lactic Acidosis, improved -Improved. LA went from 2.09 -> 3.37 -> 1.1 -> 1.0  Chronic Systolic and Diastolic CHF -Currently not decompensated  -Repeat ECHO 11/06/16 showed EF of 50-55% and Grade 2 DD -C/w Carvedilol 6.25 mg po BID -Monitor Strict I's/O's, Daily Weights, and SLIV -Patient is + 1.440 L; Weight is Up 4 lbs since admission -C/w Home Lasix of 40 mg po Daily  -Continue to Monitor Volume Status as an outpatient   Abnormal LFT's -? Reactive from Abx in the Setting of Daptomycin -AST went from 33 -> 67 -> 80 -> 67 -> 58 -> 48 -ALT went from 21 -> 40 -> 57 -> 59 -> 59 -> 52 -RUQ U/S showed Surgically absent gallbladder with normal ultrasound appearance of the liver and no evidence of biliary obstruction.   -Acute Hepatitis Panel Negative -AST/ALT elevated but stable; Continue to Hold Statin given higher risk of Rhabdomyolysis because of  Daptomycin and re-evaluate resuming as an outpatient  -Follow Up with PCP as an outpatient   Normocytic Anemia -Likely in the setting of Chronic Kidney Disease -Hb/Hct stable at 10.5/33.2 -Continue to Monitor for S/Sx of Bleeding as patient is Anticoagulated with Apixaban -Repeat CBC in AM   Thrombocytopenia, Improved -? From Infection vs. Abx; Spontaneously started trending up -Improving. Platelet Count went from 112 ->  220 -Continue to Monitor and Repeat CBC in AM   Hypokalemia  -K+ was 3.3 this AM -Replete with po KCl 40 mEQ po BID x2 Doses -Continue to Monitor and Replete as Necessary -Repeat CMP in AM   Discharge Instructions Discharge Instructions    (HEART FAILURE PATIENTS) Call MD:  Anytime you have any of the following symptoms: 1) 3 pound weight gain in 24 hours or 5 pounds in 1 week 2) shortness of breath, with or without a dry hacking cough 3) swelling in the hands, feet or stomach 4) if you have to sleep on extra pillows at night in order to breathe.   Complete by:  As directed    Call MD for:  difficulty breathing, headache or visual disturbances  Complete by:  As directed    Call MD for:  extreme fatigue   Complete by:  As directed    Call MD for:  hives   Complete by:  As directed    Call MD for:  persistant dizziness or light-headedness   Complete by:  As directed    Call MD for:  persistant nausea and vomiting   Complete by:  As directed    Call MD for:  redness, tenderness, or signs of infection (pain, swelling, redness, odor or green/yellow discharge around incision site)   Complete by:  As directed    Call MD for:  severe uncontrolled pain   Complete by:  As directed    Call MD for:  temperature >100.4   Complete by:  As directed    Diet - low sodium heart healthy   Complete by:  As directed    With 1200 mL Fluid Restriction   Discharge instructions   Complete by:  As directed    Follow up with PCP, Cardiology, and Infectious Diseases as an  outpatient. Take all medications as prescribed. If symptoms change or worsen please return to the ED for evaluation.   Home infusion instructions [facility] May follow [facility] [place] Dosing Protocol; May administer Cathflo as needed to maintain patency of vascular access device.; Flushing of vascular access device: per [facility] Protocol: 0.9% NaCl pre/post medica...   Complete by:  As directed    Instructions:  May follow [place] Dosing Protocol   Instructions:  May administer Cathflo as needed to maintain patency of vascular access device.   Instructions:  Flushing of vascular access device: per [facility] Protocol: 0.9% NaCl pre/post medication administration and prn patency; Heparin 100 u/ml, 55m for implanted ports and Heparin 10u/ml, 560mfor all other central venous catheters.   Instructions:  May follow [facility] Anaphylaxis Protocol for First Dose Administration in the home: 0.9% NaCl at 25-50 ml/hr to maintain IV access for protocol meds. Epinephrine 0.3 ml IV/IM PRN and Benadryl 25-50 IV/IM PRN s/s of anaphylaxis.   Instructions:  [facility] Coordinator (RN) to assist per patient IV care needs in the home PRN.   Increase activity slowly   Complete by:  As directed      Allergies as of 11/11/2017   No Known Allergies     Medication List    STOP taking these medications   atorvastatin 20 MG tablet Commonly known as:  LIPITOR     TAKE these medications   acetaminophen 500 MG tablet Commonly known as:  TYLENOL Take 500 mg by mouth at bedtime as needed for mild pain.   ALREX 0.2 % Susp Generic drug:  loteprednol Place 1 drop into both eyes 2 (two) times daily.   apixaban 2.5 MG Tabs tablet Commonly known as:  ELIQUIS Take 1 tablet (2.5 mg total) by mouth 2 (two) times daily.   aspirin EC 81 MG tablet Take 1 tablet (81 mg total) by mouth every Monday, Wednesday, and Friday.   carvedilol 6.25 MG tablet Commonly known as:  COREG TAKE 1 TABLET (6.25 MG TOTAL) BY  MOUTH 2 (TWO) TIMES DAILY WITH A MEAL.   daptomycin IVPB Commonly known as:  CUBICIN Inject 500 mg into the vein every other day. Indication:  MRSA bacteremia Last Day of Therapy:  12/21/17 Labs - Once weekly:  CBC/D, BMP, and CPK Labs - Every other week:  ESR and CRP Start taking on:  11/12/2017   furosemide 40 MG  tablet Commonly known as:  LASIX Take 1 tablet (40 mg total) by mouth daily.   levothyroxine 88 MCG tablet Commonly known as:  SYNTHROID, LEVOTHROID Take 88 mcg by mouth See admin instructions. Take 1 tablet (88 mcg) by mouth Monday thru Friday mornings (skip Saturday and 'Sunday)   multivitamin-lutein Caps capsule Take 1 capsule by mouth daily.   nitroGLYCERIN 0.4 MG SL tablet Commonly known as:  NITROSTAT Place 0.4 mg under the tongue every 5 (five) minutes as needed for chest pain.   Potassium Chloride ER 20 MEQ Tbcr Take 20 mEq by mouth daily.            Home Infusion Instuctions  (From admission, onward)        Start     Ordered   11/11/17 0000  Home infusion instructions [facility] May follow [facility] Dosing Protocol; May administer Cathflo as needed to maintain patency of vascular access device.; Flushing of vascular access device: per [facility] Protocol: 0.9% NaCl pre/post medica...    Question Answer Comment  Instructions May follow [facility] Dosing Protocol   Instructions May administer Cathflo as needed to maintain patency of vascular access device.   Instructions Flushing of vascular access device: per [facility] Protocol: 0.9% NaCl pre/post medication administration and prn patency; Heparin 100 u/ml, 5ml for implanted ports and Heparin 10u/ml, 5ml for all other central venous catheters.   Instructions May follow [facility] Anaphylaxis Protocol for First Dose Administration in the home: 0.9% NaCl at 25-50 ml/hr to maintain IV access for protocol meds. Epinephrine 0.3 ml IV/IM PRN and Benadryl 25-50 IV/IM PRN s/s of anaphylaxis.   Instructions [facility] Infusion Coordinator (RN) to assist per patient IV care needs in the home PRN.      01'$ /21/19 [place] for Infectious Disease. Schedule an appointment as soon as possible for a visit in 2 week(s).   Specialty:  Infectious Diseases Why:  Hospital follow up with Dr. Linus Salmons or Colletta Maryland, NP. Please call the clinic if you have not heard from the office by the end of this week.  Contact information: [address], [place] [id number] [place]       Levin Erp, MD. Call.   Specialty:  Internal Medicine Why:  Follow up within 1 week  Contact information: [place], [place] [address] [phone]        Belva Crome, MD .   Specialty:  Cardiology Contact information: [address] [place] [address] [phone]          No Known Allergies  Consultations:  Infectious Diseases Dr. Talbot Grumbling  Procedures/Studies: Dg Chest 2 View  Result Date: 11/04/2017 CLINICAL DATA:  Fever. EXAM: CHEST  2 VIEW COMPARISON:  10/30/2017 FINDINGS: The heart size and mediastinal contours are within normal limits. Aortic atherosclerosis. Resolution of bilateral pleural effusions. Both lungs are clear. The visualized skeletal structures are unremarkable. IMPRESSION: No active cardiopulmonary disease. Interval resolution of bilateral pleural effusions. Aortic Atherosclerosis (ICD10-I70.0). Electronically Signed   By: Kerby Moors M.D.   On: 11/04/2017 19:03   Dg Chest 2 View  Result Date: 10/30/2017 CLINICAL DATA:  Short of breath chest pain EXAM: CHEST  2 VIEW COMPARISON:  09/23/2016 FINDINGS: Pulmonary hyperinflation with COPD and scarring. Improvement in mild bibasilar airspace disease likely atelectasis. Minimal pleural effusion. Negative for edema. Apical scarring bilaterally. Coronary  artery stents. IMPRESSION: COPD with improvement in  bibasilar airspace disease. Small pleural effusions. Electronically Signed   By: Franchot Gallo M.D.   On: 10/30/2017 08:52   Ct Renal Stone Study  Result Date: 11/04/2017 CLINICAL DATA:  84 year old female with recent discharge from hospital for congestive heart failure exacerbation. Complaining of weakness with nausea over the past couple days. Three episodes of vomiting. Left hip pain. History kidney stones 25 years ago. Prior cholecystectomy. Initial encounter. EXAM: CT ABDOMEN AND PELVIS WITHOUT CONTRAST TECHNIQUE: Multidetector CT imaging of the abdomen and pelvis was performed following the standard protocol without IV contrast. COMPARISON:  02/13/2017 renal sonogram.  No comparison CT. FINDINGS: Lower chest: Minimal scarring lung bases. Mild cardiomegaly. Mitral valve calcification. Coronary artery calcification. Hepatobiliary: Taking into account limitation by non contrast imaging, no worrisome hepatic lesion. Post cholecystectomy. No calcified common bile duct stone. Pancreas: Taking into account limitation by non contrast imaging, no pancreatic mass or inflammation. Spleen: Taking into account limitation by non contrast imaging, no splenic mass or enlargement Adrenals/Urinary Tract: No obstructing stone or hydronephrosis. 1 cm right lower pole nonobstructing stone. Right upper pole cysts measuring up to 2 cm. No adrenal lesion. Noncontrast filled imaging urinary bladder without gross abnormality. Stomach/Bowel: Moderate sigmoid diverticulosis without CT evidence of diverticulitis. Appendix not visualized however no right lower quadrant bowel inflammatory process identified. Very small hiatal hernia may be present. No obvious gastric abnormality. Vascular/Lymphatic: Prominent vascular calcifications. No abdominal aortic aneurysm. Tortuous splenic artery. No adenopathy Reproductive: No worrisome adnexal abnormality or uterine abnormality Other: No free intraperitoneal air or bowel containing hernia.  Musculoskeletal: Hip joint degenerative changes. Scoliosis lumbar spine convex right. Superimposed degenerative changes greatest on the right at the L5-S1 level. No worrisome osseous lesion. IMPRESSION: Sigmoid diverticulosis.  No bowel inflammatory process identified. Very small hiatal hernia suspected. Aortic Atherosclerosis (ICD10-I70.0). Aortic branch vessel atherosclerotic changes. No abdominal aortic aneurysm. Coronary artery calcifications. 1 cm nonobstructing right lower pole renal calculus. Right renal cysts. Post cholecystectomy. Bilateral hip joint degenerative changes. Scoliosis lumbar spine with superimposed degenerative changes greatest on the right at the L5-S1 level. Electronically Signed   By: Genia Del M.D.   On: 11/04/2017 18:55   US Abdomen Limited Ruq  Result Date: 11/09/2017 CLINICAL DATA:  84 year old female with abnormal LFTs. EXAM: ULTRASOUND ABDOMEN LIMITED RIGHT UPPER QUADRANT COMPARISON:  CT Abdomen and Pelvis without contrast 11/04/2017 FINDINGS: Gallbladder: Surgically absent. Common bile duct: Diameter: 6 millimeters, normal. Liver: Liver echogenicity within normal limits. No intrahepatic biliary ductal dilatation or discrete liver lesion. Portal vein is patent on color Doppler imaging with normal direction of blood flow towards the liver (image 25). Other findings: Stable visible right kidney including simple appearing right upper pole cyst. IMPRESSION: Surgically absent gallbladder with normal ultrasound appearance of the liver and no evidence of biliary obstruction. Electronically Signed   By: Genevie Ann M.D.   On: 11/09/2017 07:22   TRANSTHORACIC ECHOCARDIOGRAM 10/31/17 ------------------------------------------------------------------- Study Conclusions  - Left ventricle: Distal septal and apical hypokinesis Systolic function was normal. The estimated ejection fraction was in the range of 50% to 55%. - Aortic valve: There was mild regurgitation. - Mitral valve:  Severely calcified annulus. Mildly thickened leaflets . There was mild regurgitation. - Left atrium: The atrium was mildly dilated. - Right atrium: The atrium was mildly dilated. - Atrial septum: No defect or patent foramen ovale was identified.  REPEAT TRANSTHORACIC ECHOCARDIOGRAM 11/06/17 ------------------------------------------------------------------- Study Conclusions  - Left ventricle: The cavity size was normal. Systolic function  was normal. The estimated ejection fraction was in the range of 50% to 55%. Wall motion was normal; there were no regional wall motion abnormalities. Features are consistent with a pseudonormal left ventricular filling pattern, with concomitant abnormal relaxation and increased filling pressure (grade 2 diastolic dysfunction). Doppler parameters are consistent with elevated ventricular end-diastolic filling pressure. - Aortic valve: Trileaflet; mildly thickened, mildly calcified leaflets. There was moderate regurgitation. - Mitral valve: Calcified annulus. Moderately thickened, severely calcified leaflets . There was mild regurgitation. - Left atrium: The atrium was moderately dilated. - Right ventricle: Systolic function was normal. - Tricuspid valve: There was mild regurgitation. - Pulmonary arteries: Systolic pressure was within the normal range. - Inferior vena cava: The vessel was normal in size. - Pericardium, extracardiac: There was no pericardial effusion.  Impressions:  - There was no evidence of a vegetation.  Subjective: Seen and examined and was doing better. No CP or SOB. Denied any Leg swelling; Wanting to go home.   Discharge Exam: Vitals:   11/10/17 2121 11/11/17 0438  BP: 132/72 (!) 145/69  Pulse: (!) 59 72  Resp: 18 20  Temp: 98.2 F (36.8 C) 98 F (36.7 C)  SpO2: 97% 100%   Vitals:   11/10/17 0534 11/10/17 0825 11/10/17 2121 11/11/17 0438  BP: (!) 150/43 130/80 132/72 (!) 145/69  Pulse: 72  70 (!) 59 72  Resp: '18 18 18 20  '$ Temp: 98.5 F (36.9 C) 98 F (36.7 C) 98.2 F (36.8 C) 98 F (36.7 C)  TempSrc: Oral Oral Oral Oral  SpO2: 95% 95% 97% 100%  Weight: 61.3 kg (135 lb 1.6 oz)   61.3 kg (135 lb 1.6 oz)  Height:       General: Pt is an alert elderly Caucasian female who is awake, not in acute distress Cardiovascular: RRR, S1/S2 +, no rubs, no gallops Respiratory: Diminished bilaterally, no wheezing, no rhonchi Abdominal: Soft, NT, ND, bowel sounds + Extremities: no edema, no cyanosis  The results of significant diagnostics from this hospitalization (including imaging, microbiology, ancillary and laboratory) are listed below for reference.    Microbiology: Recent Results (from the past 240 hour(s))  Culture, blood (routine x 2)     Status: Abnormal   Collection Time: 11/04/17  6:08 PM  Result Value Ref Range Status   Specimen Description BLOOD LEFT UPPER ARM  Final   Special Requests   Final    BOTTLES DRAWN AEROBIC AND ANAEROBIC Blood Culture adequate volume   Culture  Setup Time   Final    GRAM POSITIVE COCCI IN BOTH AEROBIC AND ANAEROBIC BOTTLES CRITICAL RESULT CALLED TO, READ BACK BY AND VERIFIED WITH: Karlene Einstein PHARMD 11/05/17 1037 A JW    Culture (A)  Final    STAPHYLOCOCCUS AUREUS SUSCEPTIBILITIES PERFORMED ON PREVIOUS CULTURE WITHIN THE LAST 5 DAYS.    Report Status 11/07/2017 FINAL  Final  Culture, blood (routine x 2)     Status: Abnormal   Collection Time: 11/04/17  6:20 PM  Result Value Ref Range Status   Specimen Description BLOOD RIGHT ARM  Final   Special Requests   Final    BOTTLES DRAWN AEROBIC AND ANAEROBIC Blood Culture adequate volume   Culture  Setup Time   Final    GRAM POSITIVE COCCI IN BOTH AEROBIC AND ANAEROBIC BOTTLES CRITICAL RESULT CALLED TO, READ BACK BY AND VERIFIED WITH: Karlene Einstein PHARMD 11/05/17 1037 A JW    Culture METHICILLIN RESISTANT STAPHYLOCOCCUS AUREUS (A)  Final   Report Status 11/07/2017  FINAL  Final    Organism ID, Bacteria METHICILLIN RESISTANT STAPHYLOCOCCUS AUREUS  Final      Susceptibility   Methicillin resistant staphylococcus aureus - MIC*    CIPROFLOXACIN <=0.5 SENSITIVE Sensitive     ERYTHROMYCIN >=8 RESISTANT Resistant     GENTAMICIN <=0.5 SENSITIVE Sensitive     OXACILLIN >=4 RESISTANT Resistant     TETRACYCLINE <=1 SENSITIVE Sensitive     VANCOMYCIN 1 SENSITIVE Sensitive     TRIMETH/SULFA <=10 SENSITIVE Sensitive     CLINDAMYCIN <=0.25 SENSITIVE Sensitive     RIFAMPIN <=0.5 SENSITIVE Sensitive     Inducible Clindamycin NEGATIVE Sensitive     * METHICILLIN RESISTANT STAPHYLOCOCCUS AUREUS  Blood Culture ID Panel (Reflexed)     Status: Abnormal   Collection Time: 11/04/17  6:20 PM  Result Value Ref Range Status   Enterococcus species NOT DETECTED NOT DETECTED Final   Listeria monocytogenes NOT DETECTED NOT DETECTED Final   Staphylococcus species DETECTED (A) NOT DETECTED Final    Comment: CRITICAL RESULT CALLED TO, READ BACK BY AND VERIFIED WITH: Karlene Einstein PHARMD 11/05/17 1037 A JW    Staphylococcus aureus DETECTED (A) NOT DETECTED Final    Comment: Methicillin (oxacillin)-resistant Staphylococcus aureus (MRSA). MRSA is predictably resistant to beta-lactam antibiotics (except ceftaroline). Preferred therapy is vancomycin unless clinically contraindicated. Patient requires contact precautions if  hospitalized. CRITICAL RESULT CALLED TO, READ BACK BY AND VERIFIED WITH: Karlene Einstein PHARMD 11/05/17 1037 A JW    Methicillin resistance DETECTED (A) NOT DETECTED Final    Comment: CRITICAL RESULT CALLED TO, READ BACK BY AND VERIFIED WITH: Karlene Einstein PHARMD 11/05/17 1037 A JW    Streptococcus species NOT DETECTED NOT DETECTED Final   Streptococcus agalactiae NOT DETECTED NOT DETECTED Final   Streptococcus pneumoniae NOT DETECTED NOT DETECTED Final   Streptococcus pyogenes NOT DETECTED NOT DETECTED Final   Acinetobacter baumannii NOT DETECTED NOT DETECTED Final    Enterobacteriaceae species NOT DETECTED NOT DETECTED Final   Enterobacter cloacae complex NOT DETECTED NOT DETECTED Final   Escherichia coli NOT DETECTED NOT DETECTED Final   Klebsiella oxytoca NOT DETECTED NOT DETECTED Final   Klebsiella pneumoniae NOT DETECTED NOT DETECTED Final   Proteus species NOT DETECTED NOT DETECTED Final   Serratia marcescens NOT DETECTED NOT DETECTED Final   Haemophilus influenzae NOT DETECTED NOT DETECTED Final   Neisseria meningitidis NOT DETECTED NOT DETECTED Final   Pseudomonas aeruginosa NOT DETECTED NOT DETECTED Final   Candida albicans NOT DETECTED NOT DETECTED Final   Candida glabrata NOT DETECTED NOT DETECTED Final   Candida krusei NOT DETECTED NOT DETECTED Final   Candida parapsilosis NOT DETECTED NOT DETECTED Final   Candida tropicalis NOT DETECTED NOT DETECTED Final  Urine culture     Status: Abnormal   Collection Time: 11/04/17  9:32 PM  Result Value Ref Range Status   Specimen Description URINE, RANDOM  Final   Special Requests NONE  Final   Culture 60,000 COLONIES/mL ENTEROCOCCUS FAECALIS (A)  Final   Report Status 11/07/2017 FINAL  Final   Organism ID, Bacteria ENTEROCOCCUS FAECALIS (A)  Final      Susceptibility   Enterococcus faecalis - MIC*    AMPICILLIN <=2 SENSITIVE Sensitive     LEVOFLOXACIN 1 SENSITIVE Sensitive     NITROFURANTOIN <=16 SENSITIVE Sensitive     VANCOMYCIN 1 SENSITIVE Sensitive     * 60,000 COLONIES/mL ENTEROCOCCUS FAECALIS  Culture, blood (routine x 2)     Status: Abnormal   Collection Time: 11/06/17  8:35 AM  Result Value Ref Range Status   Specimen Description BLOOD LEFT HAND  Final   Special Requests IN PEDIATRIC BOTTLE Blood Culture adequate volume  Final   Culture  Setup Time   Final    GRAM POSITIVE COCCI IN PEDIATRIC BOTTLE CRITICAL VALUE NOTED.  VALUE IS CONSISTENT WITH PREVIOUSLY REPORTED AND CALLED VALUE.    Culture (A)  Final    STAPHYLOCOCCUS AUREUS SUSCEPTIBILITIES PERFORMED ON PREVIOUS CULTURE  WITHIN THE LAST 5 DAYS.    Report Status 11/08/2017 FINAL  Final  Culture, blood (routine x 2)     Status: Abnormal   Collection Time: 11/06/17  8:43 AM  Result Value Ref Range Status   Specimen Description BLOOD LEFT ARM  Final   Special Requests IN PEDIATRIC BOTTLE Blood Culture adequate volume  Final   Culture  Setup Time   Final    GRAM POSITIVE COCCI IN PEDIATRIC BOTTLE CRITICAL VALUE NOTED.  VALUE IS CONSISTENT WITH PREVIOUSLY REPORTED AND CALLED VALUE.    Culture (A)  Final    STAPHYLOCOCCUS AUREUS SUSCEPTIBILITIES PERFORMED ON PREVIOUS CULTURE WITHIN THE LAST 5 DAYS.    Report Status 11/08/2017 FINAL  Final  Culture, blood (routine x 2)     Status: None (Preliminary result)   Collection Time: 11/08/17  6:00 AM  Result Value Ref Range Status   Specimen Description BLOOD LEFT HAND  Final   Special Requests IN PEDIATRIC BOTTLE Blood Culture adequate volume  Final   Culture NO GROWTH 2 DAYS  Final   Report Status PENDING  Incomplete  Culture, blood (routine x 2)     Status: None (Preliminary result)   Collection Time: 11/08/17  6:05 AM  Result Value Ref Range Status   Specimen Description BLOOD RIGHT HAND  Final   Special Requests IN PEDIATRIC BOTTLE Blood Culture adequate volume  Final   Culture NO GROWTH 2 DAYS  Final   Report Status PENDING  Incomplete    Labs: BNP (last 3 results) Recent Labs    10/30/17 1254  BNP 563.8*   Basic Metabolic Panel: Recent Labs  Lab 11/07/17 0619 11/08/17 0558 11/09/17 0835 11/10/17 0826 11/11/17 0406  NA 136 138 138 141 140  K 3.9 3.6 3.8 4.0 3.3*  CL 102 103 102 104 104  CO2 '24 25 23 27 26  '$ GLUCOSE 119* 121* 163* 126* 116*  BUN '17 17 15 16 16  '$ CREATININE 1.09* 1.12* 1.09* 1.06* 1.07*  CALCIUM 8.2* 8.6* 8.5* 8.7* 8.4*  MG 2.0 2.0 1.9 2.1 1.8  PHOS 2.8 3.6 3.1 3.5 3.6   Liver Function Tests: Recent Labs  Lab 11/07/17 0619 11/08/17 0558 11/09/17 0835 11/10/17 0826 11/11/17 0406  AST 67* 80* 67* 58* 48*  ALT 40  57* 59* 59* 52  ALKPHOS 45 48 60 59 50  BILITOT 0.8 0.9 1.0 0.8 0.6  PROT 5.4* 5.7* 6.4* 6.1* 5.8*  ALBUMIN 2.4* 2.5* 2.7* 2.6* 2.4*   Recent Labs  Lab 11/04/17 1807  LIPASE 55*   No results for input(s): AMMONIA in the last 168 hours. CBC: Recent Labs  Lab 11/07/17 0619 11/08/17 0558 11/09/17 0835 11/10/17 0826 11/11/17 0406  WBC 7.8 6.5 8.1 7.8 7.4  NEUTROABS 5.3 3.7 5.6 4.8 4.0  HGB 10.0* 10.6* 11.1* 11.5* 10.5*  HCT 31.7* 32.9* 35.0* 35.6* 33.2*  MCV 96.1 95.6 95.9 96.0 96.0  PLT 112* 145* 197 216 220   Cardiac Enzymes: Recent Labs  Lab 11/05/17 0332 11/05/17 0640 11/06/17 0824  CKTOTAL  --   --  90  TROPONINI 0.06* 0.07*  --    BNP: Invalid input(s): POCBNP CBG: No results for input(s): GLUCAP in the last 168 hours. D-Dimer No results for input(s): DDIMER in the last 72 hours. Hgb A1c No results for input(s): HGBA1C in the last 72 hours. Lipid Profile No results for input(s): CHOL, HDL, LDLCALC, TRIG, CHOLHDL, LDLDIRECT in the last 72 hours. Thyroid function studies No results for input(s): TSH, T4TOTAL, T3FREE, THYROIDAB in the last 72 hours.  Invalid input(s): FREET3 Anemia work up No results for input(s): VITAMINB12, FOLATE, FERRITIN, TIBC, IRON, RETICCTPCT in the last 72 hours. Urinalysis    Component Value Date/Time   COLORURINE YELLOW 11/04/2017 2002   APPEARANCEUR HAZY (A) 11/04/2017 2002   LABSPEC 1.009 11/04/2017 2002   PHURINE 5.0 11/04/2017 2002   GLUCOSEU NEGATIVE 11/04/2017 2002   HGBUR MODERATE (A) 11/04/2017 2002   BILIRUBINUR NEGATIVE 11/04/2017 2002   KETONESUR NEGATIVE 11/04/2017 2002   PROTEINUR NEGATIVE 11/04/2017 2002   UROBILINOGEN 1.0 11/18/2008 1705   NITRITE NEGATIVE 11/04/2017 2002   LEUKOCYTESUR SMALL (A) 11/04/2017 2002   Sepsis Labs Invalid input(s): PROCALCITONIN,  WBC,  LACTICIDVEN Microbiology Recent Results (from the past 240 hour(s))  Culture, blood (routine x 2)     Status: Abnormal   Collection Time:  11/04/17  6:08 PM  Result Value Ref Range Status   Specimen Description BLOOD LEFT UPPER ARM  Final   Special Requests   Final    BOTTLES DRAWN AEROBIC AND ANAEROBIC Blood Culture adequate volume   Culture  Setup Time   Final    GRAM POSITIVE COCCI IN BOTH AEROBIC AND ANAEROBIC BOTTLES CRITICAL RESULT CALLED TO, READ BACK BY AND VERIFIED WITH: Karlene Einstein PHARMD 11/05/17 1037 A JW    Culture (A)  Final    STAPHYLOCOCCUS AUREUS SUSCEPTIBILITIES PERFORMED ON PREVIOUS CULTURE WITHIN THE LAST 5 DAYS.    Report Status 11/07/2017 FINAL  Final  Culture, blood (routine x 2)     Status: Abnormal   Collection Time: 11/04/17  6:20 PM  Result Value Ref Range Status   Specimen Description BLOOD RIGHT ARM  Final   Special Requests   Final    BOTTLES DRAWN AEROBIC AND ANAEROBIC Blood Culture adequate volume   Culture  Setup Time   Final    GRAM POSITIVE COCCI IN BOTH AEROBIC AND ANAEROBIC BOTTLES CRITICAL RESULT CALLED TO, READ BACK BY AND VERIFIED WITH: Karlene Einstein PHARMD 11/05/17 1037 A JW    Culture METHICILLIN RESISTANT STAPHYLOCOCCUS AUREUS (A)  Final   Report Status 11/07/2017 FINAL  Final   Organism ID, Bacteria METHICILLIN RESISTANT STAPHYLOCOCCUS AUREUS  Final      Susceptibility   Methicillin resistant staphylococcus aureus - MIC*    CIPROFLOXACIN <=0.5 SENSITIVE Sensitive     ERYTHROMYCIN >=8 RESISTANT Resistant     GENTAMICIN <=0.5 SENSITIVE Sensitive     OXACILLIN >=4 RESISTANT Resistant     TETRACYCLINE <=1 SENSITIVE Sensitive     VANCOMYCIN 1 SENSITIVE Sensitive     TRIMETH/SULFA <=10 SENSITIVE Sensitive     CLINDAMYCIN <=0.25 SENSITIVE Sensitive     RIFAMPIN <=0.5 SENSITIVE Sensitive     Inducible Clindamycin NEGATIVE Sensitive     * METHICILLIN RESISTANT STAPHYLOCOCCUS AUREUS  Blood Culture ID Panel (Reflexed)     Status: Abnormal   Collection Time: 11/04/17  6:20 PM  Result Value Ref Range Status   Enterococcus species NOT DETECTED NOT DETECTED Final   Listeria  monocytogenes NOT DETECTED NOT DETECTED Final  Staphylococcus species DETECTED (A) NOT DETECTED Final    Comment: CRITICAL RESULT CALLED TO, READ BACK BY AND VERIFIED WITH: Karlene Einstein PHARMD 11/05/17 1037 A JW    Staphylococcus aureus DETECTED (A) NOT DETECTED Final    Comment: Methicillin (oxacillin)-resistant Staphylococcus aureus (MRSA). MRSA is predictably resistant to beta-lactam antibiotics (except ceftaroline). Preferred therapy is vancomycin unless clinically contraindicated. Patient requires contact precautions if  hospitalized. CRITICAL RESULT CALLED TO, READ BACK BY AND VERIFIED WITH: Karlene Einstein PHARMD 11/05/17 1037 A JW    Methicillin resistance DETECTED (A) NOT DETECTED Final    Comment: CRITICAL RESULT CALLED TO, READ BACK BY AND VERIFIED WITH: Karlene Einstein PHARMD 11/05/17 1037 A JW    Streptococcus species NOT DETECTED NOT DETECTED Final   Streptococcus agalactiae NOT DETECTED NOT DETECTED Final   Streptococcus pneumoniae NOT DETECTED NOT DETECTED Final   Streptococcus pyogenes NOT DETECTED NOT DETECTED Final   Acinetobacter baumannii NOT DETECTED NOT DETECTED Final   Enterobacteriaceae species NOT DETECTED NOT DETECTED Final   Enterobacter cloacae complex NOT DETECTED NOT DETECTED Final   Escherichia coli NOT DETECTED NOT DETECTED Final   Klebsiella oxytoca NOT DETECTED NOT DETECTED Final   Klebsiella pneumoniae NOT DETECTED NOT DETECTED Final   Proteus species NOT DETECTED NOT DETECTED Final   Serratia marcescens NOT DETECTED NOT DETECTED Final   Haemophilus influenzae NOT DETECTED NOT DETECTED Final   Neisseria meningitidis NOT DETECTED NOT DETECTED Final   Pseudomonas aeruginosa NOT DETECTED NOT DETECTED Final   Candida albicans NOT DETECTED NOT DETECTED Final   Candida glabrata NOT DETECTED NOT DETECTED Final   Candida krusei NOT DETECTED NOT DETECTED Final   Candida parapsilosis NOT DETECTED NOT DETECTED Final   Candida tropicalis NOT DETECTED NOT DETECTED  Final  Urine culture     Status: Abnormal   Collection Time: 11/04/17  9:32 PM  Result Value Ref Range Status   Specimen Description URINE, RANDOM  Final   Special Requests NONE  Final   Culture 60,000 COLONIES/mL ENTEROCOCCUS FAECALIS (A)  Final   Report Status 11/07/2017 FINAL  Final   Organism ID, Bacteria ENTEROCOCCUS FAECALIS (A)  Final      Susceptibility   Enterococcus faecalis - MIC*    AMPICILLIN <=2 SENSITIVE Sensitive     LEVOFLOXACIN 1 SENSITIVE Sensitive     NITROFURANTOIN <=16 SENSITIVE Sensitive     VANCOMYCIN 1 SENSITIVE Sensitive     * 60,000 COLONIES/mL ENTEROCOCCUS FAECALIS  Culture, blood (routine x 2)     Status: Abnormal   Collection Time: 11/06/17  8:35 AM  Result Value Ref Range Status   Specimen Description BLOOD LEFT HAND  Final   Special Requests IN PEDIATRIC BOTTLE Blood Culture adequate volume  Final   Culture  Setup Time   Final    GRAM POSITIVE COCCI IN PEDIATRIC BOTTLE CRITICAL VALUE NOTED.  VALUE IS CONSISTENT WITH PREVIOUSLY REPORTED AND CALLED VALUE.    Culture (A)  Final    STAPHYLOCOCCUS AUREUS SUSCEPTIBILITIES PERFORMED ON PREVIOUS CULTURE WITHIN THE LAST 5 DAYS.    Report Status 11/08/2017 FINAL  Final  Culture, blood (routine x 2)     Status: Abnormal   Collection Time: 11/06/17  8:43 AM  Result Value Ref Range Status   Specimen Description BLOOD LEFT ARM  Final   Special Requests IN PEDIATRIC BOTTLE Blood Culture adequate volume  Final   Culture  Setup Time   Final    GRAM POSITIVE COCCI IN PEDIATRIC BOTTLE CRITICAL VALUE NOTED.  VALUE  IS CONSISTENT WITH PREVIOUSLY REPORTED AND CALLED VALUE.    Culture (A)  Final    STAPHYLOCOCCUS AUREUS SUSCEPTIBILITIES PERFORMED ON PREVIOUS CULTURE WITHIN THE LAST 5 DAYS.    Report Status 11/08/2017 FINAL  Final  Culture, blood (routine x 2)     Status: None (Preliminary result)   Collection Time: 11/08/17  6:00 AM  Result Value Ref Range Status   Specimen Description BLOOD LEFT HAND  Final    Special Requests IN PEDIATRIC BOTTLE Blood Culture adequate volume  Final   Culture NO GROWTH 2 DAYS  Final   Report Status PENDING  Incomplete  Culture, blood (routine x 2)     Status: None (Preliminary result)   Collection Time: 11/08/17  6:05 AM  Result Value Ref Range Status   Specimen Description BLOOD RIGHT HAND  Final   Special Requests IN PEDIATRIC BOTTLE Blood Culture adequate volume  Final   Culture NO GROWTH 2 DAYS  Final   Report Status PENDING  Incomplete   Time coordinating discharge: 35 minutes  SIGNED:  Kerney Elbe, DO [facility] 11/11/2017, 12:39 PM Pager [phone]  If 7PM-7AM, please contact night-coverage [email or web address] Password [email or web address]

## 2017-11-13 LAB — CULTURE, BLOOD (ROUTINE X 2)
CULTURE: NO GROWTH
CULTURE: NO GROWTH
Special Requests: ADEQUATE
Special Requests: ADEQUATE

## 2017-11-19 ENCOUNTER — Ambulatory Visit: Payer: Medicare Other | Admitting: Cardiology

## 2017-11-21 ENCOUNTER — Encounter: Payer: Self-pay | Admitting: Infectious Diseases

## 2017-11-21 ENCOUNTER — Other Ambulatory Visit: Payer: Self-pay | Admitting: Interventional Cardiology

## 2017-11-28 ENCOUNTER — Ambulatory Visit: Payer: Medicare Other | Admitting: Infectious Diseases

## 2017-11-28 ENCOUNTER — Encounter: Payer: Self-pay | Admitting: Infectious Diseases

## 2017-11-28 VITALS — BP 152/73 | HR 76 | Temp 97.9°F | Ht 64.0 in | Wt 134.0 lb

## 2017-11-28 DIAGNOSIS — Z452 Encounter for adjustment and management of vascular access device: Secondary | ICD-10-CM | POA: Insufficient documentation

## 2017-11-28 DIAGNOSIS — Z5181 Encounter for therapeutic drug level monitoring: Secondary | ICD-10-CM

## 2017-11-28 DIAGNOSIS — R7881 Bacteremia: Secondary | ICD-10-CM | POA: Diagnosis not present

## 2017-11-28 LAB — CK: Total CK: 106 U/L (ref 29–143)

## 2017-11-28 NOTE — Assessment & Plan Note (Signed)
Ms. Chelsea Fuller is being treated with extended course of daptomycin dosed for her kidney function. Presumably this was related to septic thrombophlebitis after PIV was placed as there was no other source identified. There is no CK drawn from home health labs. I will check this in the office today and contact her home health team to ensure we get the correct labs drawn. She for some reason had CRP/Sed rate drawn and were elevated slightly. This is not currently useful to help guide her therapy and would recommend not checking them. She has no myalgias to report.   Will continue this through March 2nd and pull PICC at the end of therapy. She will follow up with either myself or Dr. Luciana Axeomer 7-10 days after she completes her therapy for surveillance cultures. They agree to this plan.

## 2017-11-28 NOTE — Progress Notes (Signed)
Chelsea Fuller 03/21/1924 [id number]  Chief Complaint  Patient presents with  . Hospitalization Follow-up    Staph    Patient Active Problem List   Diagnosis Date Noted  . MRSA bacteremia 11/05/2017    Priority: High  . Septic thrombophlebitis of upper extremity 11/05/2017    Priority: High  . CKD (chronic kidney disease), stage III (HCC) 09/24/2016    Priority: Medium  . PICC (peripherally inserted central catheter) in place 11/28/2017  . Abnormal LFTs 11/08/2017  . Thrombocytopenia (HCC) 11/08/2017  . Normocytic anemia 11/08/2017  . Elevated AST (SGOT) 11/07/2017  . Weakness generalized 11/04/2017  . Acute cystitis without hematuria   . Chest pain 10/30/2017  . Ischemic cardiomyopathy 10/30/2017  . CAD (coronary artery disease)-DES/LAD 2017 10/30/2017  . Hypertension 10/30/2017  . Hypoxia 10/30/2017  . Acute on chronic systolic heart failure (HCC) 10/30/2017  . Hypothyroidism, adult 10/30/2017  . Permanent atrial fibrillation (HCC) 10/30/2017  . CKD (chronic kidney disease) stage 3, GFR 30-59 ml/min (HCC) 10/30/2017  . Fever 10/30/2017  . Accelerated hypertension 09/24/2016  . CAD in native artery 09/24/2016  . Acute on chronic diastolic CHF (congestive heart failure) (HCC) 09/23/2016  . 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 Chronic atrial fibrillation (HCC)   . Epistaxis 10/04/2013  . Anemia 10/04/2013  . Nasal fracture 10/04/2013  . History of CVA (cerebrovascular accident) 10/04/2013  . HTN (hypertension) 10/04/2013  . Hypothyroidism 10/04/2013    SUBJECTIVE:   HPI/ROS:  Chelsea Fuller is a 84 y.o. female here for hospital follow up today. She was admitted 1/14 - 11/11/17 and found to have MRSA bacteremia. She had persistently positive blood cultures with suspected source septic thrombophlebitis from an IV site. Work up for source was otherwise unrevealing including negative TTE. Considering her persistently positive cultures we elected to treat her for  6 weeks with IV daptomycin 500 mg Q48h in consideration of her renal function. Her treatment end date is set through March 2nd. Her PICC line will be pulled after completing this therapy. She is at home with her daughter and [facility].   Since discharge home she has felt very well outside from being a little fatigued. She is starting to do more and more everyday. PICC line is a nuisance to her but is working well. She has no fevers, chills, abdominal pain, diarrhea, rashes or muscle pains.    Review of Systems  Constitutional: Negative for chills and fever.  HENT: Negative for tinnitus.   Eyes: Negative for blurred vision and photophobia.  Respiratory: Negative for cough and sputum production.   Cardiovascular: Negative for chest pain.  Gastrointestinal: Negative for diarrhea, nausea and vomiting.  Genitourinary: Negative for dysuria.  Skin: Negative for rash.  Neurological: Negative for headaches.     Past Medical History:  Diagnosis Date  . CAD (coronary artery disease)    a. 07/2016: anterior STEMI s/p DES x2 to LAD  . CKD (chronic kidney disease), stage III (HCC)   . CVA (cerebral infarction) 2010  . Fall    a. 2014: nasal bone fx.  . Hypertension   . Hypothyroidism   . Ischemic cardiomyopathy    a. EF 25% by cath 08/19/16 then echo showed EF 50-55% 08/20/16.  . Mitral regurgitation    a. mild by echo 07/2016.  . Mitral stenosis    a. mild by echo 07/2016.  Marland Kitchen. Permanent atrial fibrillation (HCC)    a. not presently on  anticoagulation due to STEMI 07/2016 and risk of triple therapy with DAPT + anticoag.  . Tricuspid regurgitation    a. mod by echo 07/2016.    Social History   Tobacco Use  . Smoking status: Never Smoker  . Smokeless tobacco: Never Used  Substance Use Topics  . Alcohol use: No  . Drug use: No    Family History  Problem Relation Age of Onset  . Diabetes Mellitus II Son      No Known  Allergies   OBJECTIVE: Vitals:   11/28/17 1444  BP: (!) 152/73  Pulse: 76  Temp: 97.9 F (36.6 C)  TempSrc: Oral  Weight: 134 lb (60.8 kg)  Height: 5\' 4"  (1.626 m)   Body mass index is 23 kg/m.  Physical Exam  Constitutional: She is oriented to person, place, and time and well-developed, well-nourished, and in no distress.  HENT:  Mouth/Throat: No oral lesions. No dental abscesses.  Cardiovascular: Normal rate, regular rhythm and normal heart sounds.  Pulmonary/Chest: Effort normal and breath sounds normal.  Abdominal: Soft. She exhibits no distension. There is no tenderness.  Musculoskeletal: Normal range of motion. She exhibits no tenderness.  Lymphadenopathy:    She has no cervical adenopathy.  Neurological: She is alert and oriented to person, place, and time.  Skin: Skin is warm and dry. No rash noted.  Psychiatric: Mood, affect and judgment normal.  Vitals reviewed. PICC line in RUE clean, dry and intact. Adequate blood return with labs and easy to flush.   ASSESSMENT & PLAN:  Problem List Items Addressed This Visit      Other   PICC (peripherally inserted central catheter) in place    Clean and dry and functioning well. Continue maintenance per [facility] team. Pull at completion of therapy after March 2nd.       MRSA bacteremia    [name] is being treated with extended course of daptomycin dosed for her kidney function. Presumably this was related to septic thrombophlebitis after PIV was placed as there was no other source identified. There is no CK drawn from home health labs. I will check this in the office today and contact her home health team to ensure we get the correct labs drawn. She for some reason had CRP/Sed rate drawn and were elevated slightly. This is not currently useful to help guide her therapy and would recommend not checking them. She has no myalgias to report.   Will continue this through March 2nd and pull PICC at the end of therapy. She will  follow up with either myself or Dr. Luciana Axe 7-10 days after she completes her therapy for surveillance cultures. They agree to this plan.        Other Visit Diagnoses    Medication monitoring encounter    -  Primary   Relevant Orders   CK      Rexene Alberts, MSN, [facility]  11/28/2017  4:51 PM

## 2017-11-28 NOTE — Assessment & Plan Note (Signed)
Clean and dry and functioning well. [facility] Pull at completion of therapy after March 2nd.

## 2017-11-28 NOTE — Patient Instructions (Signed)
Nice to see you are doing well.   We are going to continue the daptomycin (IV medication) through March the 2nd. You can have the home health nurse pull the PICC line after that.   Please call the office to report any of the following: - Fevers/chills - Muscle aches - Worsening joint pain (ankle, knee, hip, back)   We will also make sure the home health team is sending the labs to our clinic so we can monitor you while you are home.   We will plan on seeing you in the office the week of March 11th [facility] Dr. Luciana Axeomer to make sure your MRSA infection is gone.

## 2017-11-28 NOTE — Progress Notes (Signed)
CK stable on daptomycin. Continue at current dose. Will need to contact home health agency to ensure they are doing this weekly. Awaiting call back from Jeri ModenaPam Chandler to facilitate this communication.

## 2017-12-02 ENCOUNTER — Ambulatory Visit: Payer: Medicare Other | Admitting: Nurse Practitioner

## 2017-12-02 ENCOUNTER — Encounter: Payer: Self-pay | Admitting: Nurse Practitioner

## 2017-12-02 VITALS — BP 130/50 | HR 73 | Ht 64.0 in | Wt 138.0 lb

## 2017-12-02 DIAGNOSIS — I5042 Chronic combined systolic (congestive) and diastolic (congestive) heart failure: Secondary | ICD-10-CM

## 2017-12-02 DIAGNOSIS — I259 Chronic ischemic heart disease, unspecified: Secondary | ICD-10-CM | POA: Diagnosis not present

## 2017-12-02 DIAGNOSIS — R7881 Bacteremia: Secondary | ICD-10-CM | POA: Diagnosis not present

## 2017-12-02 NOTE — Patient Instructions (Addendum)
We will be checking the following labs today - NONE   Medication Instructions:    Continue with your current medicines.     Testing/Procedures To Be Arranged:  N/A  Follow-Up:   See me in April  See Dr. Katrinka BlazingSmith in June    Other Special Instructions:   N/A    If you need a refill on your cardiac medications before your next appointment, please call your pharmacy.   Call the [facility] office at [phone] if you have any questions, problems or concerns.

## 2017-12-02 NOTE — Progress Notes (Signed)
CARDIOLOGY OFFICE NOTE  Date:  12/02/2017    Chelsea Fuller Date of Birth: 1924/08/29 Medical Record #[id number]  PCP:  Chelsea Erp, MD  Cardiologist:  Chelsea Fuller    Chief Complaint  Patient presents with  . Congestive Heart Failure    Post hospital visit - seen for Dr. Tamala Fuller    History of Present Illness: Chelsea Fuller is a 84 y.o. female who presents today for a post hospital visit. Seen for Dr. Tamala Fuller.   She has a history of chronic systolic and diastolic CHF, CAD status post PCI in 2017, chronic atrial fibrillation, chronic kidney disease, & hypothyroidism.   Last seen here in October of 2018 by Dr. Tamala Fuller. Had lots of issues - some chest pain, slow HR. Medicines were adjusted down.   Recent presentation to the ER last month with complaints of weakness. She had just been discharged 4 days ago prior after being treated for acute CHF (most likely triggered by excessive salt - endorsed chips/salsa). She had progressive fatigue and weakness and was re-admitted and found to have an MRSA Bacteremia. ID consulted and adjusted Abx from IV Vancomycin to IV Daptomycin. Patient continued to have persistently positive Blood Cx and suspected source was septic thrombopheblitis. ID repeated Blood Cx on 11/08/17 and recommending placing PICC after 72 hours from negative blood cx's for treatment of 6 weeks total after negative Cx's. Blood Cx's on 11/08/17 finally came back negative at2Day. Noted that ID called Lab and blood cx were negative at 3 days. PICC line was to be placed. Patient improved and was cleared by ID to D/C Home with Home health as patient refused SNF.   Not noted to be seen by cardiology during this past admission - was seen at the earlier one. Had echos without evidence of vegetations.   Comes in today. Here with her daughter. Little upset that she is not seeing Dr. Tamala Fuller. She is very unsteady on her feet. Feels ok overall but still weak/tired. She is trying to walk more  and do more in the house. Says repeatedly that she is "working on it". Breathing is ok - still little short of breath at times. Her weight is stable. No swelling. Family has started letting her do more independently over the past week.  She will still have some chest pain on occasion - certainly at her baseline - not any worse. No NTG use. She saw her PCP last week - she has had labs done by home health. Her only question is "how can I get stronger quicker".   Past Medical History:  Diagnosis Date  . CAD (coronary artery disease)    a. 07/2016: anterior STEMI s/p DES x2 to LAD  . CKD (chronic kidney disease), stage III ([place])   . CVA (cerebral infarction) 2010  . Fall    a. 2014: nasal bone fx.  . Hypertension   . Hypothyroidism   . Ischemic cardiomyopathy    a. EF 25% by cath 08/19/16 then echo showed EF 50-55% 08/20/16.  . Mitral regurgitation    a. mild by echo 07/2016.  . Mitral stenosis    a. mild by echo 07/2016.  Marland Kitchen Permanent atrial fibrillation ([place])    a. not presently on anticoagulation due to STEMI 07/2016 and risk of triple therapy with DAPT + anticoag.  . Tricuspid regurgitation    a. mod by echo 07/2016.    Past Surgical History:  Procedure Laterality Date  . CARDIAC CATHETERIZATION N/A 08/19/2016  Procedure: Left Heart Cath and Coronary Angiography;  Surgeon: Chelsea Crome, MD;  LM 30%, pLAD 50%, dLAD 100% s/p  Synergy 2.5 x 24 mm DES, RCA 20%  . CARDIAC CATHETERIZATION N/A 08/19/2016   Procedure: Coronary Stent Intervention;  Surgeon: Chelsea Crome, MD;  SYNERGY DES 2.5X24 drug eluting stent  . CHOLECYSTECTOMY       Medications: Current Meds  Medication Sig  . acetaminophen (TYLENOL) 500 MG tablet Take 500 mg by mouth at bedtime as needed for mild pain.   Marland Kitchen aspirin EC 81 MG tablet Take 1 tablet (81 mg total) by mouth every Monday, Wednesday, and Friday.  . carvedilol (COREG) 6.25 MG tablet TAKE 1 TABLET (6.25 MG TOTAL) BY MOUTH 2 (TWO) TIMES DAILY WITH A MEAL.    . daptomycin (CUBICIN) IVPB Inject 500 mg into the vein every other day. Indication:  MRSA bacteremia Last Day of Therapy:  12/21/17 Labs - Once weekly:  CBC/D, BMP, and CPK Labs - Every other week:  ESR and CRP  . ELIQUIS 2.5 MG TABS tablet TAKE 1 TABLET (2.5 MG TOTAL) BY MOUTH 2 (TWO) TIMES DAILY.  . furosemide (LASIX) 40 MG tablet Take 1 tablet (40 mg total) by mouth daily.  Marland Kitchen levothyroxine (SYNTHROID, LEVOTHROID) 88 MCG tablet Take 88 mcg by mouth See admin instructions. Take 1 tablet (88 mcg) by mouth Monday thru Friday mornings (skip Saturday and Sunday)  . loteprednol (ALREX) 0.2 % SUSP Place 1 drop into both eyes 2 (two) times daily.  . multivitamin-lutein (OCUVITE-LUTEIN) CAPS capsule Take 1 capsule by mouth daily.  . nitroGLYCERIN (NITROSTAT) 0.4 MG SL tablet Place 0.4 mg under the tongue every 5 (five) minutes as needed for chest pain.   . potassium chloride 20 MEQ TBCR Take 20 mEq by mouth daily.  . [DISCONTINUED] atorvastatin (LIPITOR) 20 MG tablet TAKE 1 TABLET (20 MG TOTAL) BY MOUTH DAILY.     Allergies: No Known Allergies  Social History: The patient  reports that  has never smoked. she has never used smokeless tobacco. She reports that she does not drink alcohol or use drugs.   Family History: The patient's family history includes Diabetes Mellitus II in her son.   Review of Systems: Please see the history of present illness.   Otherwise, the review of systems is positive for none.   All other systems are reviewed and negative.   Physical Exam: VS:  BP (!) 130/50 (BP Location: Left Arm, Patient Position: Sitting, Cuff Size: Normal)   Pulse 73   Ht '5\' 4"'$  (1.626 m)   Wt 138 lb (62.6 kg)   SpO2 97% Comment: at rest  BMI 23.69 kg/m  .  BMI Body mass index is 23.69 kg/m.  Wt Readings from Last 3 Encounters:  12/02/17 138 lb (62.6 kg)  11/28/17 134 lb (60.8 kg)  11/11/17 135 lb 1.6 oz (61.3 kg)    General: Pleasant. Elderly female. Looks younger than her stated  age. She is alert and in no acute distress.   HEENT: Normal.  Neck: Supple, no JVD, carotid bruits, or masses noted.  Cardiac: Regular rhythm. Rate is ok. Heart tones are distant.  No edema.  Respiratory:  Lungs are clear to auscultation bilaterally with normal work of breathing.  GI: Soft and nontender.  MS: No deformity or atrophy. Gait and ROM intact.  Skin: Warm and dry. Color is normal.  Neuro:  Strength and sensation are intact and no gross focal deficits noted.  Psych: Alert, appropriate and  with normal affect.   LABORATORY DATA:  EKG:  EKG is not ordered today.  Lab Results  Component Value Date   WBC 7.4 11/11/2017   HGB 10.5 (L) 11/11/2017   HCT 33.2 (L) 11/11/2017   PLT 220 11/11/2017   GLUCOSE 116 (H) 11/11/2017   CHOL 187 08/19/2016   TRIG 52 08/19/2016   HDL 64 08/19/2016   LDLCALC 113 (H) 08/19/2016   ALT 52 11/11/2017   AST 48 (H) 11/11/2017   NA 140 11/11/2017   K 3.3 (L) 11/11/2017   CL 104 11/11/2017   CREATININE 1.07 (H) 11/11/2017   BUN 16 11/11/2017   CO2 26 11/11/2017   TSH 3.397 11/08/2017   INR 1.34 08/19/2016   HGBA1C 6.6 (H) 08/19/2016     BNP (last 3 results) Recent Labs    10/30/17 1254  BNP 661.1*    ProBNP (last 3 results) No results for input(s): PROBNP in the last 8760 hours.   Other Studies Reviewed Today:  Limited Echo Study Conclusions 11/06/2017  - Left ventricle: The cavity size was normal. Systolic function was   normal. The estimated ejection fraction was in the range of 50%   to 55%. Wall motion was normal; there were no regional wall   motion abnormalities. Features are consistent with a pseudonormal   left ventricular filling pattern, with concomitant abnormal   relaxation and increased filling pressure (grade 2 diastolic   dysfunction). Doppler parameters are consistent with elevated   ventricular end-diastolic filling pressure. - Aortic valve: Trileaflet; mildly thickened, mildly calcified   leaflets. There  was moderate regurgitation. - Mitral valve: Calcified annulus. Moderately thickened, severely   calcified leaflets . There was mild regurgitation. - Left atrium: The atrium was moderately dilated. - Right ventricle: Systolic function was normal. - Tricuspid valve: There was mild regurgitation. - Pulmonary arteries: Systolic pressure was within the normal   range. - Inferior vena cava: The vessel was normal in size. - Pericardium, extracardiac: There was no pericardial effusion.  Impressions:  - There was no evidence of a vegetation.  Echo Study Conclusions 10/31/2017  - Left ventricle: Distal septal and apical hypokinesis Systolic   function was normal. The estimated ejection fraction was in the   range of 50% to 55%. - Aortic valve: There was mild regurgitation. - Mitral valve: Severely calcified annulus. Mildly thickened   leaflets . There was mild regurgitation. - Left atrium: The atrium was mildly dilated. - Right atrium: The atrium was mildly dilated. - Atrial septum: No defect or patent foramen ovale was identified.    Assessment/Plan:  1. Combined chronic systolic and diastolic HF - felt to be due to excessive salt use - doing better clinically. Labs checked by home health due to the IV antibiotics. No changes made today.   2. Acute MRSA bacteremia from likely suspected septic thrombophlebitis of the left arm - currently on Daptomycin until March 1st - to see ID with repeat blood culture 1 week after completing her antibiotic therapy  3. CKD   4. HTN - BP looks ok on her current regimen. No changes made.   5. CAD - prior PCI in 2017 - doing well clinically - some chest pain but certainly at her baseline - no NTG use. No changes with her current regimen.   6. Persistent AF - on beta blocker and Coreg. Plavix stopped in October. Aspirin was cut back and was to be stopped altogether 6 months after last visit - Remains on Eliquis -  no bleeding noted.    7. Abnormal  LFT's - ? From Daptomycin - statin currently on hold - labs being checked by Home Health.   8. Normocytic Anemia  Current medicines are reviewed with the patient today.  The patient does not have concerns regarding medicines other than what has been noted above.  The following changes have been made:  See above.  Labs/ tests ordered today include:   No orders of the defined types were placed in this encounter.    Disposition:   FU with me in April. See Dr. Tamala Fuller in June. I think overall she is doing ok. Just needs a little more time and continue with her efforts at ambulation/getting stronger.   Patient is agreeable to this plan and will call if any problems develop in the interim.   SignedTruitt Merle, NP  12/02/2017 2:01 PM  [facility] [address] [place], [place]  [address] Phone: [phone] Fax: [phone]

## 2017-12-18 ENCOUNTER — Telehealth: Payer: Self-pay | Admitting: *Deleted

## 2017-12-18 NOTE — Telephone Encounter (Signed)
Amy, Home Health Nurse, calling to confirm stop date and pull PICC orders.  RN confirmed per Stephanie's last note. Andree CossHowell, Michelle M, RN

## 2017-12-19 NOTE — Telephone Encounter (Signed)
No she is good for the PICC to be D/C'd as long as her labs have been looking OK.   Thank you!

## 2017-12-19 NOTE — Telephone Encounter (Signed)
Chelsea Fuller[facility] called back. Patient will actually stop after 1 more dose as she does her meds every other day - So 3/1 instead of 3/2. She would like to have the nurse pull her PICC out on Friday. Please advise if this is ok, and if you need any labs drawn before the PICC is removed.

## 2017-12-20 ENCOUNTER — Telehealth: Payer: Self-pay | Admitting: *Deleted

## 2017-12-20 ENCOUNTER — Other Ambulatory Visit: Payer: Self-pay | Admitting: Pharmacist

## 2017-12-20 NOTE — Telephone Encounter (Signed)
Patient daughter called concerned that she was told by PCP there mothers urine does not look good and she has her PICC out today. She wants the doctor to review her labs and let them know if she still wants it removed as the urine resulted today. Advised will send a message to the doctor and someone will give her a call once a decision is made.

## 2017-12-20 NOTE — Telephone Encounter (Signed)
I called and talked to her home health nurse Chelsea Fuller and had them refax the lab work since [facility] did not receive it with the [phone] fax previously being down.   Sounds like Chelsea Fuller is symptomatic for a urinary tract infection to match her urine study - I advised to continue with PICC D/C and go by what her PCP told her for therapy by mouth based on urine culture. No further action needed on your part, [name]. Thank you!

## 2018-01-02 ENCOUNTER — Ambulatory Visit: Payer: Medicare Other | Admitting: Infectious Diseases

## 2018-01-02 VITALS — BP 165/68 | HR 60 | Temp 97.4°F | Wt 137.0 lb

## 2018-01-02 DIAGNOSIS — R7881 Bacteremia: Secondary | ICD-10-CM | POA: Diagnosis not present

## 2018-01-02 DIAGNOSIS — B9562 Methicillin resistant Staphylococcus aureus infection as the cause of diseases classified elsewhere: Secondary | ICD-10-CM

## 2018-01-02 DIAGNOSIS — I808 Phlebitis and thrombophlebitis of other sites: Secondary | ICD-10-CM

## 2018-01-02 LAB — CBC
HEMATOCRIT: 34.4 % — AB (ref 35.0–45.0)
Hemoglobin: 11.4 g/dL — ABNORMAL LOW (ref 11.7–15.5)
MCH: 30.2 pg (ref 27.0–33.0)
MCHC: 33.1 g/dL (ref 32.0–36.0)
MCV: 91.2 fL (ref 80.0–100.0)
MPV: 11.6 fL (ref 7.5–12.5)
Platelets: 223 10*3/uL (ref 140–400)
RBC: 3.77 10*6/uL — ABNORMAL LOW (ref 3.80–5.10)
RDW: 12.2 % (ref 11.0–15.0)
WBC: 5.2 10*3/uL (ref 3.8–10.8)

## 2018-01-02 NOTE — Progress Notes (Signed)
Chelsea Fuller 1924/09/24 [id number]  Chief Complaint  Patient presents with  . Follow-up    MRSA bacteremia   Patient Active Problem List   Diagnosis Date Noted  . MRSA bacteremia 11/05/2017    Priority: High  . CKD (chronic kidney disease), stage III ([facility]) 09/24/2016    Priority: Medium  . PICC (peripherally inserted central catheter) in place 11/28/2017  . Abnormal LFTs 11/08/2017  . Thrombocytopenia ([facility]) 11/08/2017  . Normocytic anemia 11/08/2017  . Elevated AST (SGOT) 11/07/2017  . Weakness generalized 11/04/2017  . Acute cystitis without hematuria   . Chest pain 10/30/2017  . Ischemic cardiomyopathy 10/30/2017  . CAD (coronary artery disease)-DES/LAD 2017 10/30/2017  . Hypertension 10/30/2017  . Hypoxia 10/30/2017  . Acute on chronic systolic heart failure (H[facility]) 10/30/2017  . Hypothyroidism, adult 10/30/2017  . Permanent atrial fibrillation (HCC) 10/30/2017  . CKD (chronic kidney disease) stage 3, GFR 30-59 ml/min ([facility]) 10/30/2017  . Fever 10/30/2017  . Accelerated hypertension 09/24/2016  . CAD in native artery 09/24/2016  . Acute on chronic diastolic CHF (congestive heart failure) ([facility]) 09/23/2016  . 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 Chronic atrial fibrillation ([facility])   . Epistaxis 10/04/2013  . Anemia 10/04/2013  . Nasal fracture 10/04/2013  . History of CVA (cerebrovascular accident) 10/04/2013  . HTN (hypertension) 10/04/2013  . Hypothyroidism 10/04/2013    SUBJECTIVE:   HPI/ROS:  Chelsea Fuller is a 84 y.o. female here for follow up on her treatment for MRSA septic thrombophlebitis 2/2 PIV. She was admitted 1/14 - 11/11/17 and found to have MRSA bacteremia. She had persistently positive blood cultures with suspected source septic thrombophlebitis from an IV site. Work up for source was otherwise unrevealing including negative TTE. She has completed 6 weeks with IV daptomycin 500 mg Q48h with last dose on March 2nd. Her PICC line has been pulled  and she is here today with her daughter.   She has been feeling better and better and is very happy about having PICC line out. She has had no further fevers or chills, increased energy and improving appetite. She did a few weeks back have some dysuria and burning with urination however these symptoms have resolved on their own.   Review of Systems  Constitutional: Negative for chills and fever.  HENT: Negative for tinnitus.   Eyes: Negative for blurred vision and photophobia.  Respiratory: Negative for cough and sputum production.   Cardiovascular: Negative for chest pain.  Gastrointestinal: Negative for diarrhea, nausea and vomiting.  Genitourinary: Negative for dysuria.  Musculoskeletal: Negative for myalgias.  Skin: Negative for rash.  Neurological: Negative for headaches.   Past Medical History:  Diagnosis Date  . CAD (coronary artery disease)    a. 07/2016: anterior STEMI s/p DES x2 to LAD  . CKD (chronic kidney disease), stage III ([facility])   . CVA (cerebral infarction) 2010  . Fall    a. 2014: nasal bone fx.  . Hypertension   . Hypothyroidism   . Ischemic cardiomyopathy    a. EF 25% by cath 08/19/16 then echo showed EF 50-55% 08/20/16.  . Mitral regurgitation    a. mild by echo 07/2016.  . Mitral stenosis    a. mild by echo 07/2016.  Marland Kitchen Permanent atrial fibrillation ([facility])    a. not presently on anticoagulation due to STEMI 07/2016 and risk of triple therapy with DAPT + anticoag.  . Tricuspid regurgitation    a. mod by echo 07/2016.  Social History   Tobacco Use  . Smoking status: Never Smoker  . Smokeless tobacco: Never Used  Substance Use Topics  . Alcohol use: No  . Drug use: No    Family History  Problem Relation Age of Onset  . Diabetes Mellitus II Son      No Known Allergies   OBJECTIVE: Vitals:   01/02/18 1013  BP: (!) 165/68  Pulse: 60  Temp: (!) 97.4 F (36.3 C)  TempSrc: Oral  Weight: 137 lb (62.1 kg)   Body mass index is 23.52  kg/m.  Physical Exam  Constitutional: She is oriented to person, place, and time and well-developed, well-nourished, and in no distress.  HENT:  Mouth/Throat: No oral lesions. No dental abscesses.  Cardiovascular: Normal rate, regular rhythm and normal heart sounds.  No murmur heard. Pulmonary/Chest: Effort normal and breath sounds normal.  Abdominal: Soft. She exhibits no distension. There is no tenderness.  Musculoskeletal: Normal range of motion. She exhibits no tenderness.  Old PICC line site looks good and where previous thrombophlebitis of the Left AC was is now normal with no residual defect.   Lymphadenopathy:    She has no cervical adenopathy.  Neurological: She is alert and oriented to person, place, and time.  Skin: Skin is warm and dry. No rash noted.  Psychiatric: Mood, affect and judgment normal.  Vitals reviewed.  ASSESSMENT & PLAN:  Problem List Items Addressed This Visit      Cardiovascular and Mediastinum   RESOLVED: Septic thrombophlebitis of upper extremity - Primary    Resolved.         Other   MRSA bacteremia    Secondary to septic thrombophlebitis now s/p treatment with 6 weeks of daptomycin. Now 12 days after last dose. She has had good clinical response and tolerated her antibiotics very well. We will recheck surveillance BCx today along with CBC. She will expect my call in 5 days or sooner if cultures are positive. She has no cardiac device or joint replacement in situ and no evidence of metastatic signs of infection on exam today.       Relevant Orders   Culture, blood (single)   Culture, blood (single)   CBC      Rexene AlbertsStephanie Dixon, MSN, [facility]NP[facility]  01/02/2018  12:55 PM

## 2018-01-02 NOTE — Assessment & Plan Note (Signed)
Secondary to septic thrombophlebitis now s/p treatment with 6 weeks of daptomycin. Now 12 days after last dose. She has had good clinical response and tolerated her antibiotics very well. We will recheck surveillance BCx today along with CBC. She will expect my call in 5 days or sooner if cultures are positive. She has no cardiac device or joint replacement in situ and no evidence of metastatic signs of infection on exam today.

## 2018-01-02 NOTE — Assessment & Plan Note (Signed)
Resolved

## 2018-01-02 NOTE — Patient Instructions (Signed)
You look much improved today and I am very happy with your progress on the antibiotics.   We will check your blood work today and call you with a final result - this may take up to 5-7 days. If they are positive sooner we will call you sooner.   No further follow up for now.

## 2018-01-07 LAB — CULTURE, BLOOD (SINGLE)

## 2018-01-08 ENCOUNTER — Telehealth: Payer: Self-pay | Admitting: *Deleted

## 2018-01-08 LAB — CULTURE, BLOOD (SINGLE)
MICRO NUMBER: 90325251
MICRO NUMBER:: 90325252
RESULT: NO GROWTH
Result:: NO GROWTH
SPECIMEN QUALITY:: ADEQUATE
SPECIMEN QUALITY:: ADEQUATE

## 2018-01-08 NOTE — Telephone Encounter (Signed)
-----   Message from Blanchard KelchStephanie N Dixon, NP sent at 01/08/2018 10:45 AM EDT ----- Please call patient or her daughter to report that her repeat blood cultures have no signs of further infection. Can see her back as needed. Thank you.

## 2018-01-08 NOTE — Telephone Encounter (Signed)
Notified patient's daughter, Gerome Apleyattie. Thank you! Andree CossHowell, Vash Quezada M, RN

## 2018-01-27 ENCOUNTER — Ambulatory Visit: Payer: Medicare Other | Admitting: Nurse Practitioner

## 2018-01-27 ENCOUNTER — Encounter: Payer: Self-pay | Admitting: Nurse Practitioner

## 2018-01-27 VITALS — BP 104/50 | HR 55 | Ht 64.0 in | Wt 134.1 lb

## 2018-01-27 DIAGNOSIS — I482 Chronic atrial fibrillation, unspecified: Secondary | ICD-10-CM

## 2018-01-27 DIAGNOSIS — Z79899 Other long term (current) drug therapy: Secondary | ICD-10-CM | POA: Diagnosis not present

## 2018-01-27 DIAGNOSIS — R7881 Bacteremia: Secondary | ICD-10-CM | POA: Diagnosis not present

## 2018-01-27 DIAGNOSIS — I259 Chronic ischemic heart disease, unspecified: Secondary | ICD-10-CM | POA: Diagnosis not present

## 2018-01-27 DIAGNOSIS — I5042 Chronic combined systolic (congestive) and diastolic (congestive) heart failure: Secondary | ICD-10-CM | POA: Diagnosis not present

## 2018-01-27 LAB — BASIC METABOLIC PANEL
BUN/Creatinine Ratio: 15 (ref 12–28)
BUN: 20 mg/dL (ref 10–36)
CO2: 24 mmol/L (ref 20–29)
Calcium: 9.7 mg/dL (ref 8.7–10.3)
Chloride: 98 mmol/L (ref 96–106)
Creatinine, Ser: 1.32 mg/dL — ABNORMAL HIGH (ref 0.57–1.00)
GFR calc Af Amer: 41 mL/min/{1.73_m2} — ABNORMAL LOW (ref >59)
GFR calc non Af Amer: 35 mL/min/{1.73_m2} — ABNORMAL LOW (ref >59)
Glucose: 119 mg/dL — ABNORMAL HIGH (ref 65–99)
Potassium: 5 mmol/L (ref 3.5–5.2)
Sodium: 141 mmol/L (ref 134–144)

## 2018-01-27 LAB — HEPATIC FUNCTION PANEL
ALT: 13 IU/L (ref 0–32)
AST: 19 IU/L (ref 0–40)
Albumin: 4.3 g/dL (ref 3.2–4.6)
Alkaline Phosphatase: 66 IU/L (ref 39–117)
Bilirubin Total: 0.4 mg/dL (ref 0.0–1.2)
Bilirubin, Direct: 0.15 mg/dL (ref 0.00–0.40)
Total Protein: 6.9 g/dL (ref 6.0–8.5)

## 2018-01-27 LAB — CBC
Hematocrit: 36.2 % (ref 34.0–46.6)
Hemoglobin: 12.3 g/dL (ref 11.1–15.9)
MCH: 30.8 pg (ref 26.6–33.0)
MCHC: 34 g/dL (ref 31.5–35.7)
MCV: 91 fL (ref 79–97)
Platelets: 164 10*3/uL (ref 150–379)
RBC: 3.99 x10E6/uL (ref 3.77–5.28)
RDW: 13.5 % (ref 12.3–15.4)
WBC: 5.1 10*3/uL (ref 3.4–10.8)

## 2018-01-27 NOTE — Progress Notes (Signed)
CARDIOLOGY OFFICE NOTE  Date:  01/27/2018    Chelsea Fuller Date of Birth: 19-Oct-1924 Medical Record #[id number]  PCP:  Nila Nephew, MD  Cardiologist:  Kyra Manges    Chief Complaint  Patient presents with  . Coronary Artery Disease  . Congestive Heart Failure    2 month check - seen for Dr. Katrinka Blazing    History of Present Illness: Chelsea Fuller is a 84 y.o. female who presents today for a follow up visit. This is a 2 month check. Seen for Dr. Katrinka Blazing.   She has a history of chronic systolic and diastolic CHF, CAD status post PCI in 2017, chronic atrial fibrillation, chronic kidney disease, & hypothyroidism.   Last seen here in October of 2018 by Dr. Katrinka Blazing. Had lots of issues - some chest pain, slow HR. Medicines were adjusted down.   Presented to the ER in January with complaints of weakness. She had just been discharged 4 days ago prior after being treated for acute CHF (most likely triggered by excessive salt - endorsed chips/salsa). She had progressive fatigue and weakness and was re-admitted and found to have an MRSA Bacteremia. ID consulted and adjusted Abx from IV Vancomycin to IV Daptomycin. Patient continuedto have persistently positive Blood Cx and suspected source was septic thrombopheblitis. ID repeated Blood Cx on 1/18/19and recommending placing PICC after 72 hours from negative blood cx's for treatment of 6 weeks total after negative Cx's. Blood Cx's on 11/08/17 finally came back negative at2Day. Noted that ID called Lab and blood cx were negative at 3 days. PICC line was to be placed. Patient improved and was cleared by ID to D/C Home with Home health as patient refused SNF.  Not noted to be seen by cardiology during this last admission - was seen at the earlier one. Had echos without evidence of vegetations.   I then saw her post hospital - little weak and unsteady but stable.   Comes in today. Here with her daughter. She is slowly getting  stronger. No chest pain. Breathing is ok. Getting some PT at home. No falls. Has had a little dizziness over the past few mornings - then passes. She went to church yesterday. She feels like she is doing well. Was released from ID - got her PICC line out. She really has no concerns today.   Past Medical History:  Diagnosis Date  . CAD (coronary artery disease)    a. 07/2016: anterior STEMI s/p DES x2 to LAD  . CKD (chronic kidney disease), stage III (HCC)   . CVA (cerebral infarction) 2010  . Fall    a. 2014: nasal bone fx.  . Hypertension   . Hypothyroidism   . Ischemic cardiomyopathy    a. EF 25% by cath 08/19/16 then echo showed EF 50-55% 08/20/16.  . Mitral regurgitation    a. mild by echo 07/2016.  . Mitral stenosis    a. mild by echo 07/2016.  Marland Kitchen Permanent atrial fibrillation (HCC)    a. not presently on anticoagulation due to STEMI 07/2016 and risk of triple therapy with DAPT + anticoag.  . Tricuspid regurgitation    a. mod by echo 07/2016.    Past Surgical History:  Procedure Laterality Date  . CARDIAC CATHETERIZATION N/A 08/19/2016   Procedure: Left Heart Cath and Coronary Angiography;  Surgeon: Lyn Records, MD;  LM 30%, pLAD 50%, dLAD 100% s/p  Synergy 2.5 x 24 mm DES, RCA 20%  . CARDIAC CATHETERIZATION  N/A 08/19/2016   Procedure: Coronary Stent Intervention;  Surgeon: Lyn Records, MD;  SYNERGY DES 2.5X24 drug eluting stent  . CHOLECYSTECTOMY       Medications: Current Meds  Medication Sig  . acetaminophen (TYLENOL) 500 MG tablet Take 500 mg by mouth at bedtime as needed for mild pain.   Marland Kitchen aspirin EC 81 MG tablet Take 1 tablet (81 mg total) by mouth every Monday, Wednesday, and Friday.  . carvedilol (COREG) 6.25 MG tablet TAKE 1 TABLET (6.25 MG TOTAL) BY MOUTH 2 (TWO) TIMES DAILY WITH A MEAL.  Marland Kitchen ELIQUIS 2.5 MG TABS tablet TAKE 1 TABLET (2.5 MG TOTAL) BY MOUTH 2 (TWO) TIMES DAILY.  . furosemide (LASIX) 40 MG tablet Take 1 tablet (40 mg total) by mouth daily.  Marland Kitchen  levothyroxine (SYNTHROID, LEVOTHROID) 88 MCG tablet Take 88 mcg by mouth See admin instructions. Take 1 tablet (88 mcg) by mouth Monday thru Friday mornings (skip Saturday and Sunday)  . loteprednol (ALREX) 0.2 % SUSP Place 1 drop into both eyes 2 (two) times daily.  . multivitamin-lutein (OCUVITE-LUTEIN) CAPS capsule Take 1 capsule by mouth daily.  . nitroGLYCERIN (NITROSTAT) 0.4 MG SL tablet Place 0.4 mg under the tongue every 5 (five) minutes as needed for chest pain.   . potassium chloride 20 MEQ TBCR Take 20 mEq by mouth daily.     Allergies: No Known Allergies  Social History: The patient  reports that she has never smoked. She has never used smokeless tobacco. She reports that she does not drink alcohol or use drugs.   Family History: The patient's family history includes Diabetes Mellitus II in her son.   Review of Systems: Please see the history of present illness.   Otherwise, the review of systems is positive for none.   All other systems are reviewed and negative.   Physical Exam: VS:  BP (!) 104/50 (BP Location: Left Arm, Patient Position: Sitting, Cuff Size: Normal)   Pulse (!) 55   Ht 5\' 4"  (1.626 m)   Wt 134 lb 1.9 oz (60.8 kg)   SpO2 97% Comment: at rest  BMI 23.02 kg/m  .  BMI Body mass index is 23.02 kg/m.  Wt Readings from Last 3 Encounters:  01/27/18 134 lb 1.9 oz (60.8 kg)  01/02/18 137 lb (62.1 kg)  12/02/17 138 lb (62.6 kg)   BP is 160/60 in the left arm and 140/60 in the right arm by me.  General: Pleasant. Well developed, well nourished and in no acute distress.  She looks younger than her stated age.  HEENT: Normal.  Neck: Supple, no JVD, carotid bruits, or masses noted.  Cardiac: Fairly regular rhythm today. No murmur. No edema.  Respiratory:  Lungs are clear to auscultation bilaterally with normal work of breathing.  GI: Soft and nontender.  MS: No deformity or atrophy. Gait and ROM intact. Little unsteady. Using a cane.  Skin: Warm and dry.  Color is normal.  Neuro:  Strength and sensation are intact and no gross focal deficits noted.  Psych: Alert, appropriate and with normal affect.   LABORATORY DATA:  EKG:  EKG is not ordered today.  Lab Results  Component Value Date   WBC 5.2 01/02/2018   HGB 11.4 (L) 01/02/2018   HCT 34.4 (L) 01/02/2018   PLT 223 01/02/2018   GLUCOSE 116 (H) 11/11/2017   CHOL 187 08/19/2016   TRIG 52 08/19/2016   HDL 64 08/19/2016   LDLCALC 113 (H) 08/19/2016   ALT 52  11/11/2017   AST 48 (H) 11/11/2017   NA 140 11/11/2017   K 3.3 (L) 11/11/2017   CL 104 11/11/2017   CREATININE 1.07 (H) 11/11/2017   BUN 16 11/11/2017   CO2 26 11/11/2017   TSH 3.397 11/08/2017   INR 1.34 08/19/2016   HGBA1C 6.6 (H) 08/19/2016     BNP (last 3 results) Recent Labs    10/30/17 1254  BNP 661.1*    ProBNP (last 3 results) No results for input(s): PROBNP in the last 8760 hours.   Other Studies Reviewed Today:  Limited Echo Study Conclusions 11/06/2017  - Left ventricle: The cavity size was normal. Systolic function was normal. The estimated ejection fraction was in the range of 50% to 55%. Wall motion was normal; there were no regional wall motion abnormalities. Features are consistent with a pseudonormal left ventricular filling pattern, with concomitant abnormal relaxation and increased filling pressure (grade 2 diastolic dysfunction). Doppler parameters are consistent with elevated ventricular end-diastolic filling pressure. - Aortic valve: Trileaflet; mildly thickened, mildly calcified leaflets. There was moderate regurgitation. - Mitral valve: Calcified annulus. Moderately thickened, severely calcified leaflets . There was mild regurgitation. - Left atrium: The atrium was moderately dilated. - Right ventricle: Systolic function was normal. - Tricuspid valve: There was mild regurgitation. - Pulmonary arteries: Systolic pressure was within the normal range. - Inferior  vena cava: The vessel was normal in size. - Pericardium, extracardiac: There was no pericardial effusion.  Impressions:  - There was no evidence of a vegetation.  Echo Study Conclusions 10/31/2017  - Left ventricle: Distal septal and apical hypokinesis Systolic function was normal. The estimated ejection fraction was in the range of 50% to 55%. - Aortic valve: There was mild regurgitation. - Mitral valve: Severely calcified annulus. Mildly thickened leaflets . There was mild regurgitation. - Left atrium: The atrium was mildly dilated. - Right atrium: The atrium was mildly dilated. - Atrial septum: No defect or patent foramen ovale was identified.    Assessment/Plan:  1. Combined chronic systolic and diastolic HF - doing much better with salt restriction. Weight is down.  No changes made today.   2. Acute MRSA bacteremia from likely suspected septic thrombophlebitis of the left arm - treated on Daptomycin until March 1st - has been released from ID. No fevers or chills reported. She looks like she is slowly getting stronger.   3. CKD - repeat lab today.  4. HTN - BP recheck by me is ok - I would follow for now.   5. CAD - prior PCI in 2017 - doing well clinically - she denies chest pain  6. Persistent AF - on beta blocker and Coreg. Plavix stopped in October. She is just taking aspirin 3 times a week. Remains on Eliquis - no bleeding noted.  Lab today.   7. Abnormal LFT's - ? From Daptomycin - statin currently on hold - checking labs today  8. Normocytic Anemia - repeat lab today  9. Dizziness - not persistent - none at time of visit - will follow.   Current medicines are reviewed with the patient today.  The patient does not have concerns regarding medicines other than what has been noted above.  The following changes have been made:  See above.  Labs/ tests ordered today include:    Orders Placed This Encounter  Procedures  . Basic metabolic  panel  . CBC  . Hepatic function panel     Disposition:   FU with Dr. Katrinka Blazing as planned  in June.   Patient is agreeable to this plan and will call if any problems develop in the interim.   SignedNorma Fredrickson: Camary Sosa, NP  01/27/2018 11:39 AM  [facility] [address] [place], [place]  [address] Phone: [phone]) [phone] Fax: [phone]) [phone]

## 2018-01-27 NOTE — Patient Instructions (Signed)
We will be checking the following labs today - BMET, CBC, & HPF   Medication Instructions:    Continue with your current medicines.     Testing/Procedures To Be Arranged:  N/A  Follow-Up:   See Dr. Katrinka BlazingSmith as planned in June.     Other Special Instructions:   N/A    If you need a refill on your cardiac medications before your next appointment, please call your pharmacy.   Call the [facility] office at [phone] if you have any questions, problems or concerns.

## 2018-01-28 ENCOUNTER — Other Ambulatory Visit: Payer: Self-pay | Admitting: *Deleted

## 2018-01-28 MED ORDER — FUROSEMIDE 40 MG PO TABS
20.0000 mg | ORAL_TABLET | Freq: Every day | ORAL | 0 refills | Status: DC
Start: 2018-01-28 — End: 2018-03-26

## 2018-03-25 NOTE — Progress Notes (Signed)
Cardiology Office Note    Date:  03/26/2018   ID:  Chelsea Fuller, DOB [birth year], MRN [id number]  PCP:  Nila Nephew, MD  Cardiologist: Lesleigh Noe, MD   Chief Complaint  Patient presents with  . Coronary Artery Disease  . Congestive Heart Failure    History of Present Illness:  Chelsea Fuller is a 84 y.o. female with a history of hypertension, chronic diastolic HF, hypothyroidism, chronic atrial fibrillation (felt too high risk for [facility]), CKD and stroke (2010) and recently diagnosed CAD w/ STEMI s/p DESx2 to LAD. Initial EF 25% improved to greater than 50%.  Feels well.  She is confused about her diuretic therapy.  Recently had furosemide decreased to 20 mg/day by Norma Fredrickson because of a bump in creatinine to 1.32.  Dr. Shepard General increased the medication back to 40 mg/day because her blood pressure increased.  She feels well.  Had no heart disease consult the lower dose of furosemide.  Is confused about what to do with her medication regimen.    Past Medical History:  Diagnosis Date  . CAD (coronary artery disease)    a. 07/2016: anterior STEMI s/p DES x2 to LAD  . CKD (chronic kidney disease), stage III (HCC)   . CVA (cerebral infarction) 2010  . Fall    a. 2014: nasal bone fx.  . Hypertension   . Hypothyroidism   . Ischemic cardiomyopathy    a. EF 25% by cath 08/19/16 then echo showed EF 50-55% 08/20/16.  . Mitral regurgitation    a. mild by echo 07/2016.  . Mitral stenosis    a. mild by echo 07/2016.  Marland Kitchen Permanent atrial fibrillation (HCC)    a. not presently on anticoagulation due to STEMI 07/2016 and risk of triple therapy with DAPT + anticoag.  . Tricuspid regurgitation    a. mod by echo 07/2016.    Past Surgical History:  Procedure Laterality Date  . CARDIAC CATHETERIZATION N/A 08/19/2016   Procedure: Left Heart Cath and Coronary Angiography;  Surgeon: Lyn Records, MD;  LM 30%, pLAD 50%, dLAD 100% s/p  Synergy 2.5 x 24 mm DES, RCA 20%  .  CARDIAC CATHETERIZATION N/A 08/19/2016   Procedure: Coronary Stent Intervention;  Surgeon: Lyn Records, MD;  SYNERGY DES 2.5X24 drug eluting stent  . CHOLECYSTECTOMY      Current Medications: Outpatient Medications Prior to Visit  Medication Sig Dispense Refill  . acetaminophen (TYLENOL) 500 MG tablet Take 500 mg by mouth at bedtime as needed for mild pain.     . carvedilol (COREG) 6.25 MG tablet TAKE 1 TABLET (6.25 MG TOTAL) BY MOUTH 2 (TWO) TIMES DAILY WITH A MEAL. 180 tablet 3  . ELIQUIS 2.5 MG TABS tablet TAKE 1 TABLET (2.5 MG TOTAL) BY MOUTH 2 (TWO) TIMES DAILY. 180 tablet 3  . levothyroxine (SYNTHROID, LEVOTHROID) 88 MCG tablet Take 88 mcg by mouth See admin instructions. Take 1 tablet (88 mcg) by mouth Monday thru Friday mornings (skip Saturday and Sunday)    . loteprednol (ALREX) 0.2 % SUSP Place 1 drop into both eyes 2 (two) times daily.    . multivitamin-lutein (OCUVITE-LUTEIN) CAPS capsule Take 1 capsule by mouth daily.    . nitroGLYCERIN (NITROSTAT) 0.4 MG SL tablet Place 0.4 mg under the tongue every 5 (five) minutes as needed for chest pain.     . furosemide (LASIX) 40 MG tablet Take 0.5 tablets (20 mg total) by mouth daily. 30 tablet 0  .  potassium chloride 20 MEQ TBCR Take 20 mEq by mouth daily. 30 tablet 0   No facility-administered medications prior to visit.      Allergies:   Patient has no known allergies.   Social History   Socioeconomic History  . Marital status: Widowed    Spouse name: Not on file  . Number of children: Not on file  . Years of education: Not on file  . Highest education level: Not on file  Occupational History  . Not on file  Social Needs  . Financial resource strain: Not on file  . Food insecurity:    Worry: Not on file    Inability: Not on file  . Transportation needs:    Medical: Not on file    Non-medical: Not on file  Tobacco Use  . Smoking status: Never Smoker  . Smokeless tobacco: Never Used  Substance and Sexual Activity    . Alcohol use: No  . Drug use: No  . Sexual activity: Not Currently  Lifestyle  . Physical activity:    Days per week: Not on file    Minutes per session: Not on file  . Stress: Not on file  Relationships  . Social connections:    Talks on phone: Not on file    Gets together: Not on file    Attends religious service: Not on file    Active member of club or organization: Not on file    Attends meetings of clubs or organizations: Not on file    Relationship status: Not on file  Other Topics Concern  . Not on file  Social History Narrative   Lives independently     Family History:  The patient's family history includes Diabetes Mellitus II in her son.   ROS:   Please see the history of present illness.    Multiple complaints that include shooting chest discomfort that can last up to 10 to 15 seconds that occurs spontaneously.  No associated radiation or dyspnea. All other systems reviewed and are negative.   PHYSICAL EXAM:   VS:  BP (!) 164/60   Pulse 71   Ht 5' 4.5" (1.638 m)   Wt 136 lb 12.8 oz (62.1 kg)   BMI 23.12 kg/m    GEN: Well nourished, well developed, in no acute distress  HEENT: normal  Neck: no JVD, carotid bruits, or masses Cardiac: RRR; no murmurs, rubs, or gallops,no edema  Respiratory:  clear to auscultation bilaterally, normal work of breathing GI: soft, nontender, nondistended, + BS MS: no deformity or atrophy  Skin: warm and dry, no rash Neuro:  Alert and Oriented x 3, Strength and sensation are intact Psych: euthymic mood, full affect  Wt Readings from Last 3 Encounters:  03/26/18 136 lb 12.8 oz (62.1 kg)  01/27/18 134 lb 1.9 oz (60.8 kg)  01/02/18 137 lb (62.1 kg)      Studies/Labs Reviewed:   EKG:  EKG EKG is not repeated on today's exam  Recent Labs: 10/30/2017: B Natriuretic Peptide 661.1 11/08/2017: TSH 3.397 11/11/2017: Magnesium 1.8 01/27/2018: ALT 13; BUN 20; Creatinine, Ser 1.32; Hemoglobin 12.3; Platelets 164; Potassium 5.0;  Sodium 141   Lipid Panel    Component Value Date/Time   CHOL 187 08/19/2016 0328   TRIG 52 08/19/2016 0328   HDL 64 08/19/2016 0328   CHOLHDL 2.9 08/19/2016 0328   VLDL 10 08/19/2016 0328   LDLCALC 113 (H) 08/19/2016 0328    Additional studies/ records that were reviewed today include:  No new imaging or functional data    ASSESSMENT:    1. Coronary artery disease involving native coronary artery, angina presence unspecified, unspecified whether native or transplanted heart   2. Chronic atrial fibrillation (HCC)   3. Essential hypertension   4. CKD (chronic kidney disease), stage III (HCC)   5. Chronic combined systolic and diastolic heart failure (HCC)      PLAN:  In order of problems listed above:  1. Stable with intermittent episodes of chest pain that occurs spontaneously lasting less than 3 minutes. 2. Controlled heart rate 3. Add Aldactone 12.5 mg/day, stop potassium, decrease furosemide to 20 mg/day, basic metabolic panel in 1 to 2 weeks, blood pressure clinic 4 weeks. 4. Not reevaluated.  Add low-dose Aldactone and use lower intensity furosemide.  This may allow her to take medications without concern of diuresis that interrupts quality of life. 5. No evidence of volume overload.  Blood work in 7 to 10 days.  Clinical follow-up in 1 month to make sure blood pressure control is adequate after medication changes.  9-month follow-up with me.  Medication Adjustments/Labs and Tests Ordered: Current medicines are reviewed at length with the patient today.  Concerns regarding medicines are outlined above.  Medication changes, Labs and Tests ordered today are listed in the Patient Instructions below. Patient Instructions  Medication Instructions:  1) DECREASE Furosemide to 20mg  once daily 2) START Spironolactone 12.5mg  once daily 3) DISCONTINUE Potassium  Labwork: Your physician recommends that you return for lab work in: 2 weeks  (BMET)   Testing/Procedures: None  Follow-Up: Your physician recommends that you schedule a follow-up appointment in: 1 month with the [facility].  Your physician wants you to follow-up in: 6 months with Dr. Katrinka Blazing.  You will receive a reminder letter in the mail two months in advance. If you don't receive a letter, please call our office to schedule the follow-up appointment.    Any Other Special Instructions Will Be Listed Below (If Applicable).     If you need a refill on your cardiac medications before your next appointment, please call your pharmacy.      Signed, Lesleigh Noe, MD  03/26/2018 5:00 PM    [facility] [address] [place], [place], [place]  [address] Phone: [phone]; Fax: [phone]

## 2018-03-26 ENCOUNTER — Ambulatory Visit: Payer: Medicare Other | Admitting: Interventional Cardiology

## 2018-03-26 ENCOUNTER — Encounter: Payer: Self-pay | Admitting: Interventional Cardiology

## 2018-03-26 VITALS — BP 164/60 | HR 71 | Ht 64.5 in | Wt 136.8 lb

## 2018-03-26 DIAGNOSIS — N183 Chronic kidney disease, stage 3 unspecified: Secondary | ICD-10-CM

## 2018-03-26 DIAGNOSIS — I482 Chronic atrial fibrillation, unspecified: Secondary | ICD-10-CM

## 2018-03-26 DIAGNOSIS — I5042 Chronic combined systolic (congestive) and diastolic (congestive) heart failure: Secondary | ICD-10-CM

## 2018-03-26 DIAGNOSIS — I2510 Atherosclerotic heart disease of native coronary artery without angina pectoris: Secondary | ICD-10-CM

## 2018-03-26 DIAGNOSIS — I10 Essential (primary) hypertension: Secondary | ICD-10-CM

## 2018-03-26 MED ORDER — FUROSEMIDE 20 MG PO TABS: 20.0000 mg | ORAL_TABLET | Freq: Every day | ORAL | 3 refills | Status: DC

## 2018-03-26 MED ORDER — SPIRONOLACTONE 25 MG PO TABS: 12.5000 mg | ORAL_TABLET | Freq: Every day | ORAL | 3 refills | Status: DC

## 2018-03-26 NOTE — Patient Instructions (Signed)
Medication Instructions:  1) DECREASE Furosemide to 20mg  once daily 2) START Spironolactone 12.5mg  once daily 3) DISCONTINUE Potassium  Labwork: Your physician recommends that you return for lab work in: 2 weeks (BMET)   Testing/Procedures: None  Follow-Up: Your physician recommends that you schedule a follow-up appointment in: 1 month with the Hypertension Clinic.  Your physician wants you to follow-up in: 6 months with Dr. Katrinka BlazingSmith.  You will receive a reminder letter in the mail two months in advance. If you don't receive a letter, please call our office to schedule the follow-up appointment.    Any Other Special Instructions Will Be Listed Below (If Applicable).     If you need a refill on your cardiac medications before your next appointment, please call your pharmacy.

## 2018-04-10 ENCOUNTER — Other Ambulatory Visit: Payer: Medicare Other

## 2018-04-11 LAB — BASIC METABOLIC PANEL
BUN/Creatinine Ratio: 13 (ref 12–28)
BUN: 16 mg/dL (ref 10–36)
CO2: 24 mmol/L (ref 20–29)
CREATININE: 1.27 mg/dL — AB (ref 0.57–1.00)
Calcium: 9.3 mg/dL (ref 8.7–10.3)
Chloride: 102 mmol/L (ref 96–106)
GFR calc Af Amer: 43 mL/min/{1.73_m2} — ABNORMAL LOW (ref >59)
GFR, EST NON AFRICAN AMERICAN: 37 mL/min/{1.73_m2} — AB (ref >59)
Glucose: 205 mg/dL — ABNORMAL HIGH (ref 65–99)
Potassium: 4.1 mmol/L (ref 3.5–5.2)
SODIUM: 142 mmol/L (ref 134–144)

## 2018-04-22 ENCOUNTER — Ambulatory Visit (INDEPENDENT_AMBULATORY_CARE_PROVIDER_SITE_OTHER): Payer: Medicare Other | Admitting: Pharmacist

## 2018-04-22 ENCOUNTER — Encounter: Payer: Self-pay | Admitting: Pharmacist

## 2018-04-22 VITALS — BP 162/64

## 2018-04-22 DIAGNOSIS — I10 Essential (primary) hypertension: Secondary | ICD-10-CM

## 2018-04-22 MED ORDER — VALSARTAN 40 MG PO TABS: 40.0000 mg | ORAL_TABLET | Freq: Every day | ORAL | 2 refills | Status: DC

## 2018-04-22 NOTE — Progress Notes (Signed)
Patient ID: Chelsea Fuller                 DOB: 09-23-1924                      MRN: [id number]     HPI: Chelsea Fuller is a 84 y.o. female patient of Dr. Katrinka Blazing who presents today for hypertension evaluation. PMH significant for hypertension,chronic diastolic HF,hypothyroidism, chronic atrial fibrillation (felt too high risk for [facility]), CKD and stroke (2010) and recently diagnosed CAD w/ STEMI s/p DESx2 to LAD. Initial EF 25% improved to greater than 50%. At her most recent visit with Dr. Katrinka Blazing she was started on spironolactone, and her furosemide was decreased. BMET a week later revealed stable kidney function and potassium.   She presents today for additional management with her daughter who helps transport the patient. She occasionally gets SOB when she over exerts herself. Denies dizziness, and chest pain. She does occasionally get headaches, but not new or different. She is somewhat unsteady on feet at baseline.    Current HTN meds:  Carvedilol 6.25mg  BID (8am and 6pm) Furosemide 20mg  daily in the morning Spironolactone 12.5mg  daily in the morning  Previously tried: none  BP goal: <150/90 (age)  Family History: Diabetes Mellitus II in her son.  Social History: Denies tobacco and alcohol  Diet: Most meals from home. She eats vegetables regularly. She uses salt on some foods, but is cutting back. She drinks water mostly. She usually has 1-2 cups of coffee per morning.   Exercise: She has worked with therapist who gave her exercises. She walks around the house several times per day.   Home BP readings: She has a cuff, but has not been using as Dr. Chilton Si recommended against.   Wt Readings from Last 3 Encounters:  03/26/18 136 lb 12.8 oz (62.1 kg)  01/27/18 134 lb 1.9 oz (60.8 kg)  01/02/18 137 lb (62.1 kg)   BP Readings from Last 3 Encounters:  04/22/18 (!) 162/64  03/26/18 (!) 164/60  01/27/18 (!) 104/50   Pulse Readings from Last 3 Encounters:  03/26/18 71  01/27/18  (!) 55  01/02/18 60    Renal function: CrCl cannot be calculated (Unknown ideal weight.).  Past Medical History:  Diagnosis Date  . CAD (coronary artery disease)    a. 07/2016: anterior STEMI s/p DES x2 to LAD  . CKD (chronic kidney disease), stage III (HCC)   . CVA (cerebral infarction) 2010  . Fall    a. 2014: nasal bone fx.  . Hypertension   . Hypothyroidism   . Ischemic cardiomyopathy    a. EF 25% by cath 08/19/16 then echo showed EF 50-55% 08/20/16.  . Mitral regurgitation    a. mild by echo 07/2016.  . Mitral stenosis    a. mild by echo 07/2016.  Marland Kitchen Permanent atrial fibrillation (HCC)    a. not presently on anticoagulation due to STEMI 07/2016 and risk of triple therapy with DAPT + anticoag.  . Tricuspid regurgitation    a. mod by echo 07/2016.    Current Outpatient Medications on File Prior to Visit  Medication Sig Dispense Refill  . acetaminophen (TYLENOL) 500 MG tablet Take 500 mg by mouth at bedtime as needed for mild pain.     . carvedilol (COREG) 6.25 MG tablet TAKE 1 TABLET (6.25 MG TOTAL) BY MOUTH 2 (TWO) TIMES DAILY WITH A MEAL. 180 tablet 3  . ELIQUIS 2.5 MG TABS tablet TAKE 1  TABLET (2.5 MG TOTAL) BY MOUTH 2 (TWO) TIMES DAILY. 180 tablet 3  . furosemide (LASIX) 20 MG tablet Take 1 tablet (20 mg total) by mouth daily. 90 tablet 3  . levothyroxine (SYNTHROID, LEVOTHROID) 88 MCG tablet Take 88 mcg by mouth See admin instructions. Take 1 tablet (88 mcg) by mouth Monday thru Friday mornings (skip Saturday and Sunday)    . loteprednol (ALREX) 0.2 % SUSP Place 1 drop into both eyes 2 (two) times daily.    . multivitamin-lutein (OCUVITE-LUTEIN) CAPS capsule Take 1 capsule by mouth daily.    Marland Kitchen. spironolactone (ALDACTONE) 25 MG tablet Take 0.5 tablets (12.5 mg total) by mouth daily. 45 tablet 3  . nitroGLYCERIN (NITROSTAT) 0.4 MG SL tablet Place 0.4 mg under the tongue every 5 (five) minutes as needed for chest pain.      No current facility-administered medications on  file prior to visit.     No Known Allergies  Blood pressure (!) 162/64.   Assessment/Plan: Hypertension: BP today remains above goal. Will target less strict goal of <150/90 given age and slightly unsteady gait at baseline. Will start low dose ARB, valsartan 40mg  once daily in addition to current regimen for CV risk reduction/CKD. Continue all other medications as prescribed. Advised to monitor gait/dizziness and go to local pharmacy to have pressure checked. Call with concerns/changes. Will repeat BMET and BP check in 3-4 weeks.    Thank you, Freddie ApleyKelley M. Cleatis PolkaAuten, PharmD  [facility]  04/22/2018 10:53 AM

## 2018-04-22 NOTE — Patient Instructions (Addendum)
START valsartan 40mg  ONCE daily.   Continue all other medications as prescribed.   Return for labs and blood pressure check in 3-4 weeks.   [phone]

## 2018-05-15 ENCOUNTER — Other Ambulatory Visit: Payer: Self-pay | Admitting: Interventional Cardiology

## 2018-05-20 ENCOUNTER — Ambulatory Visit (INDEPENDENT_AMBULATORY_CARE_PROVIDER_SITE_OTHER): Payer: Medicare Other | Admitting: Pharmacist

## 2018-05-20 ENCOUNTER — Other Ambulatory Visit: Payer: Medicare Other

## 2018-05-20 VITALS — BP 154/60 | HR 51

## 2018-05-20 DIAGNOSIS — I10 Essential (primary) hypertension: Secondary | ICD-10-CM | POA: Diagnosis not present

## 2018-05-20 LAB — BASIC METABOLIC PANEL
BUN/Creatinine Ratio: 12 (ref 12–28)
BUN: 15 mg/dL (ref 10–36)
CHLORIDE: 101 mmol/L (ref 96–106)
CO2: 23 mmol/L (ref 20–29)
Calcium: 8.9 mg/dL (ref 8.7–10.3)
Creatinine, Ser: 1.28 mg/dL — ABNORMAL HIGH (ref 0.57–1.00)
GFR calc Af Amer: 42 mL/min/{1.73_m2} — ABNORMAL LOW (ref >59)
GFR calc non Af Amer: 37 mL/min/{1.73_m2} — ABNORMAL LOW (ref >59)
Glucose: 104 mg/dL — ABNORMAL HIGH (ref 65–99)
POTASSIUM: 4.6 mmol/L (ref 3.5–5.2)
Sodium: 139 mmol/L (ref 134–144)

## 2018-05-20 MED ORDER — CARVEDILOL 3.125 MG PO TABS: 3.1250 mg | ORAL_TABLET | Freq: Two times a day (BID) | ORAL | 1 refills | Status: DC

## 2018-05-20 NOTE — Progress Notes (Signed)
Patient ID: Chelsea Fuller                 DOB: 1924/01/29                      MRN: [id number]     HPI: Chelsea Fuller is a 84 y.o. female patient of Dr. Katrinka Blazing who presents today for hypertension follow up. PMH significant for hypertension,chronic diastolic HF,hypothyroidism, chronic atrial fibrillation (felt too high risk for [facility]), CKD and stroke (2010) and recently diagnosed CAD w/ STEMI s/p DESx2 to LAD. Initial EF 25% improved to greater than 50%. At her most recent visit with Dr. Katrinka Blazing she was started on spironolactone, and her furosemide was decreased. At her last visit with HTN clinic she was started on valsartan 40mg  daily.   She presents today with her daughter for blood pressure follow up. She reports that she has done well since our last visit. She denies dizziness, though she is visibly unstable on her feet today. She states that she has good days and bad days with her gait. She states that today is a bad day.   Current HTN meds:  Carvedilol 6.25mg  BID (8am and 6pm) Furosemide 20mg  daily in the morning Spironolactone 12.5mg  daily in the morning Valsartan 40mg  daily  Previously tried: none  BP goal: <150/90 (age)  Family History: Diabetes Mellitus II in her son.  Social History: Denies tobacco and alcohol  Diet: Most meals from home. She eats vegetables regularly. She uses salt on some foods, but is cutting back. She drinks water mostly. She usually has 1-2 cups of coffee per morning.   Exercise: She has worked with therapist who gave her exercises. She walks around the house several times per day.   Home BP readings: She has a cuff, but has not been using as Dr. Chilton Si recommended against.   Wt Readings from Last 3 Encounters:  03/26/18 136 lb 12.8 oz (62.1 kg)  01/27/18 134 lb 1.9 oz (60.8 kg)  01/02/18 137 lb (62.1 kg)   BP Readings from Last 3 Encounters:  05/20/18 (!) 154/60  04/22/18 (!) 162/64  03/26/18 (!) 164/60   Pulse Readings from Last 3  Encounters:  05/20/18 (!) 51  03/26/18 71  01/27/18 (!) 55    Renal function: CrCl cannot be calculated (Unknown ideal weight.).  Past Medical History:  Diagnosis Date  . CAD (coronary artery disease)    a. 07/2016: anterior STEMI s/p DES x2 to LAD  . CKD (chronic kidney disease), stage III (HCC)   . CVA (cerebral infarction) 2010  . Fall    a. 2014: nasal bone fx.  . Hypertension   . Hypothyroidism   . Ischemic cardiomyopathy    a. EF 25% by cath 08/19/16 then echo showed EF 50-55% 08/20/16.  . Mitral regurgitation    a. mild by echo 07/2016.  . Mitral stenosis    a. mild by echo 07/2016.  Marland Kitchen Permanent atrial fibrillation (HCC)    a. not presently on anticoagulation due to STEMI 07/2016 and risk of triple therapy with DAPT + anticoag.  . Tricuspid regurgitation    a. mod by echo 07/2016.    Current Outpatient Medications on File Prior to Visit  Medication Sig Dispense Refill  . acetaminophen (TYLENOL) 500 MG tablet Take 500 mg by mouth at bedtime as needed for mild pain.     Marland Kitchen ELIQUIS 2.5 MG TABS tablet TAKE 1 TABLET (2.5 MG TOTAL) BY MOUTH 2 (TWO)  TIMES DAILY. 180 tablet 3  . furosemide (LASIX) 20 MG tablet Take 1 tablet (20 mg total) by mouth daily. 90 tablet 3  . levothyroxine (SYNTHROID, LEVOTHROID) 88 MCG tablet Take 88 mcg by mouth See admin instructions. Take 1 tablet (88 mcg) by mouth Monday thru Friday mornings (skip Saturday and Sunday)    . loteprednol (ALREX) 0.2 % SUSP Place 1 drop into both eyes 2 (two) times daily.    . multivitamin-lutein (OCUVITE-LUTEIN) CAPS capsule Take 1 capsule by mouth daily.    . nitroGLYCERIN (NITROSTAT) 0.4 MG SL tablet Place 0.4 mg under the tongue every 5 (five) minutes as needed for chest pain.     Marland Kitchen. spironolactone (ALDACTONE) 25 MG tablet Take 0.5 tablets (12.5 mg total) by mouth daily. 45 tablet 3  . valsartan (DIOVAN) 40 MG tablet TAKE 1 TABLET BY MOUTH EVERY DAY 90 tablet 3   No current facility-administered medications on  file prior to visit.     No Known Allergies  Blood pressure (!) 154/60, pulse (!) 51, SpO2 96 %.   Assessment/Plan: Hypertension: BMET today. BP today is borderline at goal and improved from previous. However, her HR is low. Will reduce carvedilol to 3.125mg  BID. To compensate for BP will plan to increase valsartan to 80mg  daily pending BMET results. Follow up in HTN clinic in 4-6 weeks.   Thank you, Freddie ApleyKelley M. Cleatis PolkaAuten, PharmD  [facility]  05/21/2018 9:18 AM  ADDENDUM: BMET stable. Will proceed as above. Pt aware of results will repeat BMET with follow up visit.

## 2018-05-20 NOTE — Patient Instructions (Addendum)
Blood work today - BMET  DECREASE carvedilol to 3.125mg  TWICE daily (take 1/2 tablet of your current supply TWICE daily then pick up lower dose from pharmacy and take 1 tablet TWICE daily)  PENDING your blood work - we will plan to INCREASE valsartan to 80mg  ONCE daily (you may take 2 tablets of your current supply then pick up higher dose from the pharmacy and take 1 tablet daily)  Follow up in blood pressure clinic in 4-6 weeks.

## 2018-05-21 ENCOUNTER — Encounter: Payer: Self-pay | Admitting: Pharmacist

## 2018-05-26 ENCOUNTER — Telehealth: Payer: Self-pay | Admitting: Interventional Cardiology

## 2018-05-26 NOTE — Telephone Encounter (Signed)
Reviewed w/ pharmD.  (they saw pt last week). Advised pt to follow up w/ PCP about her concerns.  Explained that Carvedilol was decreased, so fatigue would not be r/t to a decrease in the medication. Also not sure what the stomach pains are r/t?  Pt then tells me she isn't sure if she has "a bug". Pt agreeable to discussing concerns w/ PCP.  She will call us back if they feel there is a cardiology follow up need r/t concerns.

## 2018-05-26 NOTE — Telephone Encounter (Signed)
New Message     Pt c/o medication issue:  1. Name of Medication: carvedilol (COREG) 3.125 MG tablet  2. How are you currently taking this medication (dosage and times per day)?   3. Are you having a reaction (difficulty breathing--STAT)? Fatigue and pains in stomach  4. What is your medication issue? Patients daughter is calling on behalf of her mother. She states that her mother is fatigue and having stomach pains. Please call to discuss.

## 2018-06-17 ENCOUNTER — Other Ambulatory Visit: Payer: Medicare Other

## 2018-06-17 ENCOUNTER — Ambulatory Visit (INDEPENDENT_AMBULATORY_CARE_PROVIDER_SITE_OTHER): Payer: Medicare Other | Admitting: Pharmacist

## 2018-06-17 VITALS — BP 148/58 | HR 51

## 2018-06-17 DIAGNOSIS — I10 Essential (primary) hypertension: Secondary | ICD-10-CM | POA: Diagnosis not present

## 2018-06-17 LAB — BASIC METABOLIC PANEL
BUN/Creatinine Ratio: 16 (ref 12–28)
BUN: 21 mg/dL (ref 10–36)
CO2: 22 mmol/L (ref 20–29)
CREATININE: 1.33 mg/dL — AB (ref 0.57–1.00)
Calcium: 9.1 mg/dL (ref 8.7–10.3)
Chloride: 101 mmol/L (ref 96–106)
GFR calc Af Amer: 40 mL/min/{1.73_m2} — ABNORMAL LOW (ref >59)
GFR calc non Af Amer: 35 mL/min/{1.73_m2} — ABNORMAL LOW (ref >59)
GLUCOSE: 99 mg/dL (ref 65–99)
Potassium: 4.8 mmol/L (ref 3.5–5.2)
Sodium: 141 mmol/L (ref 134–144)

## 2018-06-17 NOTE — Patient Instructions (Addendum)
It was great seeing you today!  Your blood pressure in clinic today was 148/58. Your goal blood pressure is <150/90.  We will call you after your labs from today come back. If everything looks good we can continue your blood pressure medications as prescribed.  If your bruise doesn't get better in a week, go and see your PCP (Dr. Chilton SiGreen).

## 2018-06-17 NOTE — Progress Notes (Signed)
Patient ID: Chelsea Fuller                 DOB: 12/29/1923                      MRN: [id number]     HPI: Chelsea Fuller is a 84 y.o. female patient of Dr. Katrinka BlazingSmith who presents today for hypertension follow up. PMH significant for hypertension,chronic diastolic HF,hypothyroidism, chronic [facility] CKD and stroke (2010) and recently diagnosed CAD w/ STEMI s/p DESx2 to LAD. Initial EF 25% improved to greater than 50%. At her last visit with HTN clinic her BP was 154/60, but her carvedilol was decreased to 3.125 mg BID due to instability while walking. Her valsartan was increased to 80 mg after her BMET returned WNL.  She presents today with her daughter for BP management. Since medication changes at last visit, she feels the same as before. She is still walking with a walker. She denies dizziness, SOB, and chest pains. She denies any symptoms of a fib. She does note a bruise on the back of her leg, but does not remember hitting it.    Current HTN meds:  Carvedilol 3.125mg  BID (8am and 6pm) Furosemide 20mg  daily in the morning Spironolactone 12.5mg  daily in the morning Valsartan 80mg  daily  Previously tried: none  BP goal: <150/90 (age)  Family History: Diabetes Mellitus II in her son.  Social History: Denies tobacco and alcohol  Diet: Most meals from home. She eats vegetables regularly. She uses salt on some foods, but is cutting back. She drinks water mostly. She usually has 1-2 cups of coffee per morning. Lately, she has noticed a lack of appetite and has been eating lots of sweets.  Exercise: She has worked with therapist who gave her exercises. She walks around the house several times per day.   Home BP readings: She has a cuff, but has not been using as Dr. Chilton SiGreen recommended against.   Wt Readings from Last 3 Encounters:  03/26/18 136 lb 12.8 oz (62.1 kg)  01/27/18 134 lb 1.9 oz (60.8 kg)  01/02/18 137 lb (62.1 kg)   BP Readings from  Last 3 Encounters:  06/17/18 (!) 148/58  05/20/18 (!) 154/60  04/22/18 (!) 162/64   Pulse Readings from Last 3 Encounters:  06/17/18 (!) 51  05/20/18 (!) 51  03/26/18 71    Renal function: CrCl cannot be calculated (Patient's most recent lab result is older than the maximum 21 days allowed.).  Past Medical History:  Diagnosis Date  . CAD (coronary artery disease)    a. 07/2016: anterior STEMI s/p DES x2 to LAD  . CKD (chronic kidney disease), stage III (HCC)   . CVA (cerebral infarction) 2010  . Fall    a. 2014: nasal bone fx.  . Hypertension   . Hypothyroidism   . Ischemic cardiomyopathy    a. EF 25% by cath 08/19/16 then echo showed EF 50-55% 08/20/16.  . Mitral regurgitation    a. mild by echo 07/2016.  . Mitral stenosis    a. mild by echo 07/2016.  Marland Kitchen. Permanent atrial fibrillation (HCC)    a. not presently on anticoagulation due to STEMI 07/2016 and risk of triple therapy with DAPT + anticoag.  . Tricuspid regurgitation    a. mod by echo 07/2016.    Current Outpatient Medications on File Prior to Visit  Medication Sig Dispense Refill  . acetaminophen (TYLENOL) 500 MG  tablet Take 500 mg by mouth at bedtime as needed for mild pain.     . carvedilol (COREG) 3.125 MG tablet Take 1 tablet (3.125 mg total) by mouth 2 (two) times daily with a meal. 180 tablet 1  . ELIQUIS 2.5 MG TABS tablet TAKE 1 TABLET (2.5 MG TOTAL) BY MOUTH 2 (TWO) TIMES DAILY. 180 tablet 3  . furosemide (LASIX) 20 MG tablet Take 1 tablet (20 mg total) by mouth daily. 90 tablet 3  . levothyroxine (SYNTHROID, LEVOTHROID) 88 MCG tablet Take 88 mcg by mouth See admin instructions. Take 1 tablet (88 mcg) by mouth Monday thru Friday mornings (skip Saturday and Sunday)    . loteprednol (ALREX) 0.2 % SUSP Place 1 drop into both eyes 2 (two) times daily.    . multivitamin-lutein (OCUVITE-LUTEIN) CAPS capsule Take 1 capsule by mouth daily.    . nitroGLYCERIN (NITROSTAT) 0.4 MG SL tablet Place 0.4 mg under the  tongue every 5 (five) minutes as needed for chest pain.     Marland Kitchen spironolactone (ALDACTONE) 25 MG tablet Take 0.5 tablets (12.5 mg total) by mouth daily. 45 tablet 3  . valsartan (DIOVAN) 80 MG tablet Take 1 tablet (80 mg total) by mouth daily.     No current facility-administered medications on file prior to visit.     No Known Allergies  Blood pressure (!) 148/58, pulse (!) 51, SpO2 96 %.   Assessment/Plan: Hypertension:  BMET today. BP at visit was controlled (<150/90) after recheck. If BMET returns WNL, continue current BP regimen. Informed patient that if her bruise does not appear lighter in 1 week, to follow-up with her PCP. F/u with HTN clinic as needed.  Thank you, Freddie Apley. Cleatis Polka, PharmD  [facility]  06/17/2018 11:50 AM  Patient seen with Thomes Cake, PharmD Candidate  ADDENDUM: Called patient and notified her of lab results. Continue BP medications as prescribed.

## 2018-07-01 IMAGING — CR DG CHEST 1V PORT
1 series · 1 of 1 positions shown · non-contrast
Comparison: Chest x-ray 08/19/2016.

CLINICAL DATA: 89-year-old female with history of dyspnea.

EXAM:
PORTABLE CHEST 1 VIEW

[AP]
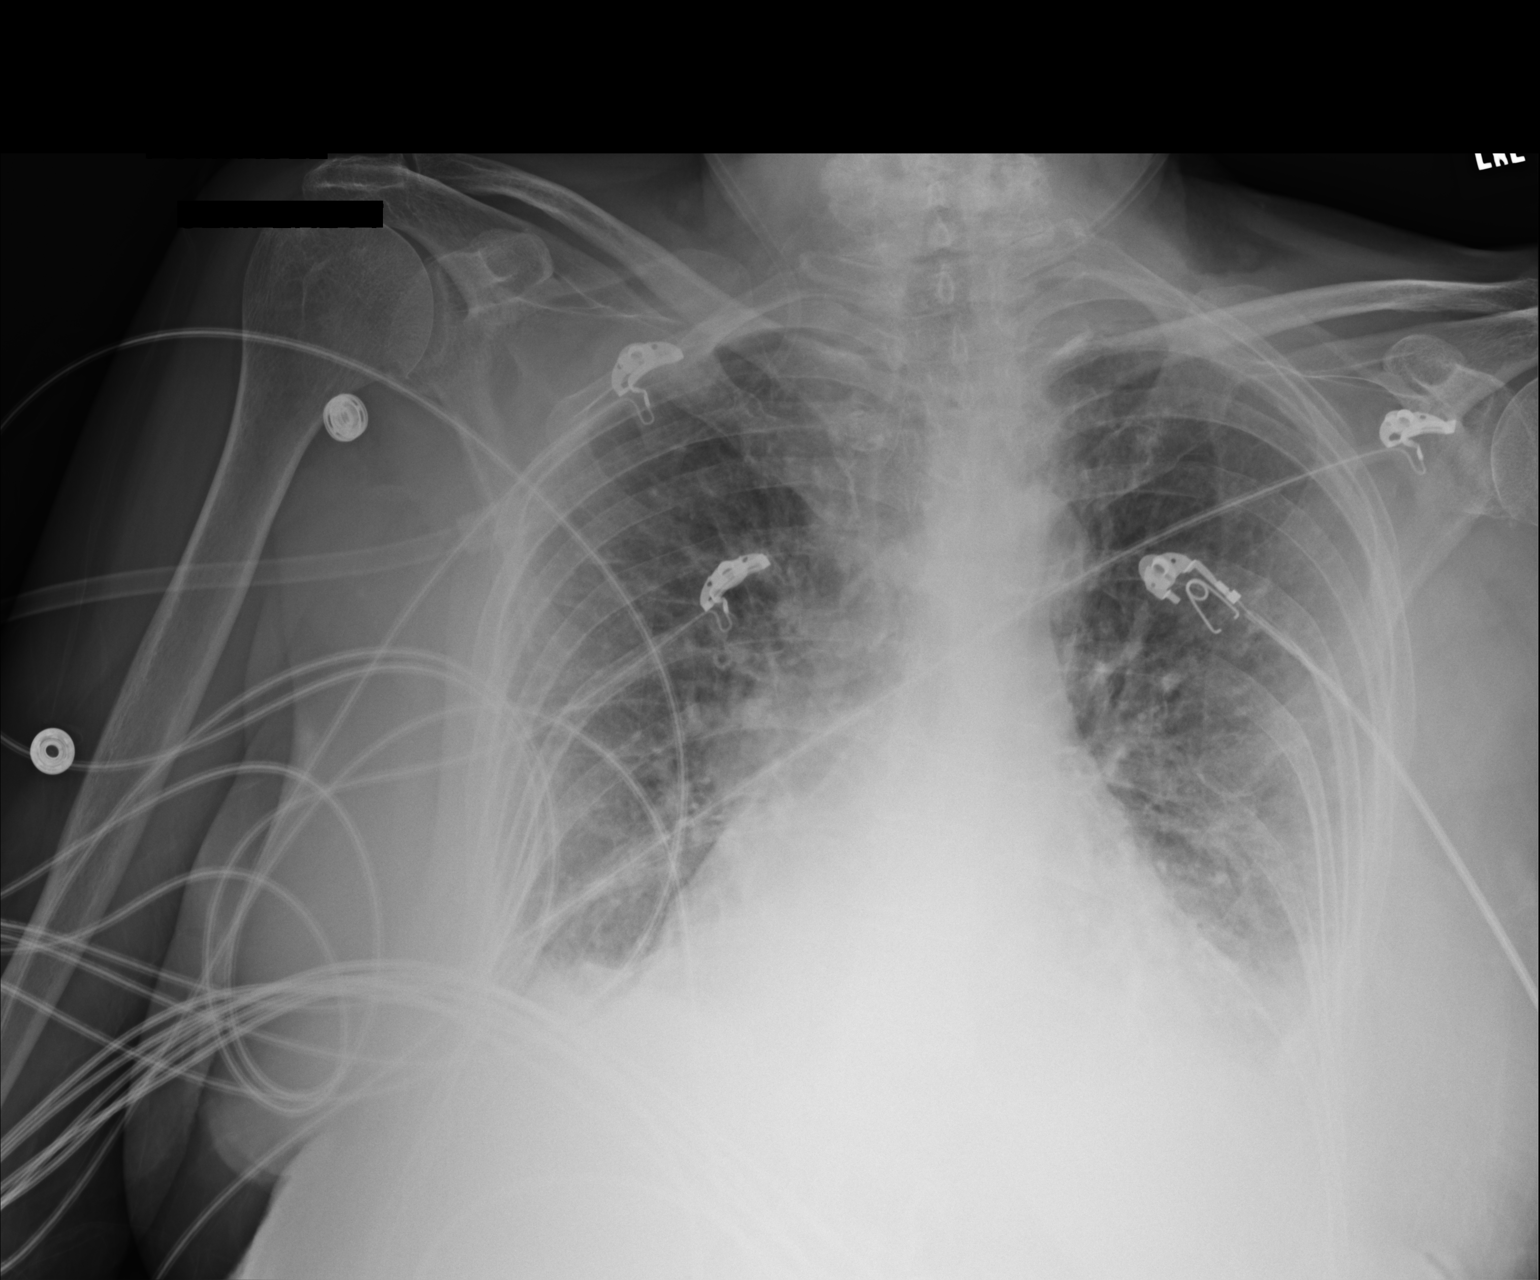

[1 of 1 positions shown; findings below may reference images not displayed]

FINDINGS: Lung volumes are low. Bibasilar opacities may reflect areas of
atelectasis and/or consolidation. Small bilateral pleural effusions.
No evidence of pulmonary edema. Heart size is mildly enlarged. Upper
mediastinal contours are within normal limits. Atherosclerosis in
the thoracic aorta.
IMPRESSION: 1. Low lung volumes with bibasilar atelectasis and/or consolidation
with small bilateral pleural effusions.
2. Mild cardiomegaly.
3. Atherosclerosis.

## 2018-09-03 ENCOUNTER — Other Ambulatory Visit: Payer: Self-pay

## 2018-09-03 MED ORDER — APIXABAN 2.5 MG PO TABS: ORAL_TABLET | ORAL | 1 refills | Status: DC

## 2018-09-03 NOTE — Telephone Encounter (Signed)
Pt last saw Dr Katrinka BlazingSmith 03/26/18, last labs 06/17/18 Creat 1.33.  Pt has Stage III CKD, Creat fluctuates.  Age 84, weight 62.1kg, prev wt 60.8kg on 01/27/18, wt is borderline and Creat elevated, discussed with Prudence DavidsonKelley Auten, PharmD will have pt continue on Eliquis 2.5mg  BID for now and monitor weight and kidney function. Will refill rx.

## 2018-09-30 ENCOUNTER — Emergency Department (HOSPITAL_COMMUNITY): Payer: Medicare Other

## 2018-09-30 ENCOUNTER — Observation Stay (HOSPITAL_COMMUNITY)
Admission: EM | Admit: 2018-09-30 | Discharge: 2018-10-01 | Disposition: A | Discharge status: Home / Self Care | Payer: Medicare Other | Attending: Internal Medicine | Admitting: Internal Medicine

## 2018-09-30 ENCOUNTER — Other Ambulatory Visit: Payer: Self-pay

## 2018-09-30 ENCOUNTER — Encounter (HOSPITAL_COMMUNITY): Payer: Self-pay | Admitting: Emergency Medicine

## 2018-09-30 DIAGNOSIS — I129 Hypertensive chronic kidney disease with stage 1 through stage 4 chronic kidney disease, or unspecified chronic kidney disease: Secondary | ICD-10-CM | POA: Diagnosis not present

## 2018-09-30 DIAGNOSIS — N183 Chronic kidney disease, stage 3 unspecified: Secondary | ICD-10-CM | POA: Diagnosis present

## 2018-09-30 DIAGNOSIS — R0602 Shortness of breath: Secondary | ICD-10-CM

## 2018-09-30 DIAGNOSIS — E785 Hyperlipidemia, unspecified: Secondary | ICD-10-CM | POA: Diagnosis not present

## 2018-09-30 DIAGNOSIS — I4821 Permanent atrial fibrillation: Secondary | ICD-10-CM | POA: Diagnosis not present

## 2018-09-30 DIAGNOSIS — Z66 Do not resuscitate: Secondary | ICD-10-CM | POA: Diagnosis not present

## 2018-09-30 DIAGNOSIS — Z8673 Personal history of transient ischemic attack (TIA), and cerebral infarction without residual deficits: Secondary | ICD-10-CM | POA: Insufficient documentation

## 2018-09-30 DIAGNOSIS — R739 Hyperglycemia, unspecified: Secondary | ICD-10-CM | POA: Insufficient documentation

## 2018-09-30 DIAGNOSIS — I255 Ischemic cardiomyopathy: Secondary | ICD-10-CM | POA: Insufficient documentation

## 2018-09-30 DIAGNOSIS — Z7901 Long term (current) use of anticoagulants: Secondary | ICD-10-CM | POA: Insufficient documentation

## 2018-09-30 DIAGNOSIS — Z833 Family history of diabetes mellitus: Secondary | ICD-10-CM | POA: Insufficient documentation

## 2018-09-30 DIAGNOSIS — I252 Old myocardial infarction: Secondary | ICD-10-CM | POA: Insufficient documentation

## 2018-09-30 DIAGNOSIS — R0600 Dyspnea, unspecified: Secondary | ICD-10-CM | POA: Diagnosis not present

## 2018-09-30 DIAGNOSIS — I482 Chronic atrial fibrillation, unspecified: Secondary | ICD-10-CM | POA: Diagnosis not present

## 2018-09-30 DIAGNOSIS — E039 Hypothyroidism, unspecified: Secondary | ICD-10-CM | POA: Insufficient documentation

## 2018-09-30 DIAGNOSIS — M8580 Other specified disorders of bone density and structure, unspecified site: Secondary | ICD-10-CM | POA: Diagnosis not present

## 2018-09-30 DIAGNOSIS — I2510 Atherosclerotic heart disease of native coronary artery without angina pectoris: Secondary | ICD-10-CM | POA: Insufficient documentation

## 2018-09-30 DIAGNOSIS — R0789 Other chest pain: Secondary | ICD-10-CM | POA: Insufficient documentation

## 2018-09-30 DIAGNOSIS — Z79899 Other long term (current) drug therapy: Secondary | ICD-10-CM | POA: Diagnosis not present

## 2018-09-30 DIAGNOSIS — Z955 Presence of coronary angioplasty implant and graft: Secondary | ICD-10-CM | POA: Insufficient documentation

## 2018-09-30 DIAGNOSIS — Z79890 Hormone replacement therapy (postmenopausal): Secondary | ICD-10-CM | POA: Diagnosis not present

## 2018-09-30 DIAGNOSIS — I10 Essential (primary) hypertension: Secondary | ICD-10-CM | POA: Diagnosis present

## 2018-09-30 DIAGNOSIS — J449 Chronic obstructive pulmonary disease, unspecified: Secondary | ICD-10-CM | POA: Diagnosis not present

## 2018-09-30 HISTORY — DX: Dyspnea, unspecified: R06.00

## 2018-09-30 LAB — COMPREHENSIVE METABOLIC PANEL
ALT: 12 U/L (ref 0–44)
AST: 20 U/L (ref 15–41)
Albumin: 3.6 g/dL (ref 3.5–5.0)
Alkaline Phosphatase: 41 U/L (ref 38–126)
Anion gap: 13 (ref 5–15)
BUN: 19 mg/dL (ref 8–23)
CO2: 24 mmol/L (ref 22–32)
Calcium: 8.7 mg/dL — ABNORMAL LOW (ref 8.9–10.3)
Chloride: 104 mmol/L (ref 98–111)
Creatinine, Ser: 1.41 mg/dL — ABNORMAL HIGH (ref 0.44–1.00)
GFR calc Af Amer: 37 mL/min — ABNORMAL LOW (ref >60)
GFR calc non Af Amer: 32 mL/min — ABNORMAL LOW (ref >60)
Glucose, Bld: 136 mg/dL — ABNORMAL HIGH (ref 70–99)
Potassium: 3.9 mmol/L (ref 3.5–5.1)
Sodium: 141 mmol/L (ref 135–145)
Total Bilirubin: 1.1 mg/dL (ref 0.3–1.2)
Total Protein: 6.5 g/dL (ref 6.5–8.1)

## 2018-09-30 LAB — TROPONIN I
TROPONIN I: 0.04 ng/mL — AB (ref <0.03)
Troponin I: 0.04 ng/mL (ref <0.03)
Troponin I: 0.04 ng/mL (ref <0.03)

## 2018-09-30 LAB — CBC WITH DIFFERENTIAL/PLATELET
ABS IMMATURE GRANULOCYTES: 0.02 10*3/uL (ref 0.00–0.07)
Basophils Absolute: 0.1 10*3/uL (ref 0.0–0.1)
Basophils Relative: 1 %
Eosinophils Absolute: 0.2 10*3/uL (ref 0.0–0.5)
Eosinophils Relative: 5 %
HCT: 37.7 % (ref 36.0–46.0)
Hemoglobin: 12 g/dL (ref 12.0–15.0)
IMMATURE GRANULOCYTES: 0 %
Lymphocytes Relative: 31 %
Lymphs Abs: 1.5 10*3/uL (ref 0.7–4.0)
MCH: 31 pg (ref 26.0–34.0)
MCHC: 31.8 g/dL (ref 30.0–36.0)
MCV: 97.4 fL (ref 80.0–100.0)
Monocytes Absolute: 0.5 10*3/uL (ref 0.1–1.0)
Monocytes Relative: 10 %
Neutro Abs: 2.6 10*3/uL (ref 1.7–7.7)
Neutrophils Relative %: 53 %
Platelets: 140 10*3/uL — ABNORMAL LOW (ref 150–400)
RBC: 3.87 MIL/uL (ref 3.87–5.11)
RDW: 13.2 % (ref 11.5–15.5)
WBC: 4.9 10*3/uL (ref 4.0–10.5)
nRBC: 0 % (ref 0.0–0.2)

## 2018-09-30 LAB — I-STAT TROPONIN, ED: Troponin i, poc: 0.03 ng/mL (ref 0.00–0.08)

## 2018-09-30 LAB — BRAIN NATRIURETIC PEPTIDE: B Natriuretic Peptide: 245.9 pg/mL — ABNORMAL HIGH (ref 0.0–100.0)

## 2018-09-30 MED ORDER — IRBESARTAN 150 MG PO TABS
75.0000 mg | ORAL_TABLET | Freq: Every day | ORAL | Status: DC
  Administered 2018-09-30 – 2018-10-01 (×2): 75 mg via ORAL
  Filled 2018-09-30 (×3): qty 1

## 2018-09-30 MED ORDER — NITROGLYCERIN 0.4 MG SL SUBL: 0.4000 mg | SUBLINGUAL_TABLET | SUBLINGUAL | Status: DC | PRN

## 2018-09-30 MED ORDER — LOTEPREDNOL ETABONATE 0.5 % OP SUSP
1.0000 [drp] | Freq: Two times a day (BID) | OPHTHALMIC | Status: DC
  Administered 2018-09-30 – 2018-10-01 (×2): 1 [drp] via OPHTHALMIC
  Filled 2018-09-30: qty 5

## 2018-09-30 MED ORDER — SPIRONOLACTONE 12.5 MG HALF TABLET
12.5000 mg | ORAL_TABLET | Freq: Every day | ORAL | Status: DC
  Administered 2018-09-30 – 2018-10-01 (×2): 12.5 mg via ORAL
  Filled 2018-09-30 (×2): qty 1

## 2018-09-30 MED ORDER — FUROSEMIDE 20 MG PO TABS
20.0000 mg | ORAL_TABLET | Freq: Every day | ORAL | Status: DC
  Administered 2018-09-30 – 2018-10-01 (×2): 20 mg via ORAL
  Filled 2018-09-30 (×2): qty 1

## 2018-09-30 MED ORDER — ONDANSETRON HCL 4 MG/2ML IJ SOLN: 4.0000 mg | Freq: Four times a day (QID) | INTRAMUSCULAR | Status: DC | PRN

## 2018-09-30 MED ORDER — CARVEDILOL 3.125 MG PO TABS
3.1250 mg | ORAL_TABLET | Freq: Two times a day (BID) | ORAL | Status: DC
  Administered 2018-09-30 – 2018-10-01 (×2): 3.125 mg via ORAL
  Filled 2018-09-30 (×3): qty 1

## 2018-09-30 MED ORDER — MORPHINE SULFATE (PF) 2 MG/ML IV SOLN: 2.0000 mg | INTRAVENOUS | Status: DC | PRN

## 2018-09-30 MED ORDER — APIXABAN 2.5 MG PO TABS
2.5000 mg | ORAL_TABLET | Freq: Two times a day (BID) | ORAL | Status: DC
  Administered 2018-09-30 – 2018-10-01 (×2): 2.5 mg via ORAL
  Filled 2018-09-30 (×4): qty 1

## 2018-09-30 MED ORDER — ACETAMINOPHEN 325 MG PO TABS: 650.0000 mg | ORAL_TABLET | ORAL | Status: DC | PRN

## 2018-09-30 MED ORDER — OCUVITE-LUTEIN PO CAPS
1.0000 | ORAL_CAPSULE | Freq: Every day | ORAL | Status: DC
  Filled 2018-09-30 (×3): qty 1

## 2018-09-30 MED ORDER — LEVOTHYROXINE SODIUM 88 MCG PO TABS
88.0000 ug | ORAL_TABLET | ORAL | Status: DC
  Administered 2018-10-01: 88 ug via ORAL
  Filled 2018-09-30: qty 1

## 2018-09-30 NOTE — ED Provider Notes (Signed)
[facility] EMERGENCY DEPARTMENT Provider Note   CSN: [id number] Arrival date & time: 09/30/18  0153     History   Chief Complaint Chief Complaint  Patient presents with  . Shortness of Breath    HPI Chelsea Fuller is a 84 y.o. female.  Patient with past medical history remarkable for CAD, A. fib, on Eliquis, ischemic cardiomyopathy, hypertension, and CKD, presents to the emergency department with a chief complaint of shortness of breath.  She states that she has been feeling poorly most of the day today.  States that she has had some cough, but denies any fever or chills.  She denies any chest pain.  Denies any abdominal pain.  She states that this feels similar to how she has felt in the past when she has too much fluid on her lungs.  She has been taking Lasix daily.  She was brought to the emergency department by EMS, who reported respiration rate of 33, patient was given 2 L nasal cannula which improved patient comfort and decreased respiration rate.  The history is provided by the patient. No language interpreter was used.    Past Medical History:  Diagnosis Date  . CAD (coronary artery disease)    a. 07/2016: anterior STEMI s/p DES x2 to LAD  . CKD (chronic kidney disease), stage III (HCC)   . CVA (cerebral infarction) 2010  . Fall    a. 2014: nasal bone fx.  . Hypertension   . Hypothyroidism   . Ischemic cardiomyopathy    a. EF 25% by cath 08/19/16 then echo showed EF 50-55% 08/20/16.  . Mitral regurgitation    a. mild by echo 07/2016.  . Mitral stenosis    a. mild by echo 07/2016.  Marland Kitchen Permanent atrial fibrillation    a. not presently on anticoagulation due to STEMI 07/2016 and risk of triple therapy with DAPT + anticoag.  . Tricuspid regurgitation    a. mod by echo 07/2016.    Patient Active Problem List   Diagnosis Date Noted  . PICC (peripherally inserted central catheter) in place 11/28/2017  . Abnormal LFTs 11/08/2017  .  Thrombocytopenia (HCC) 11/08/2017  . Normocytic anemia 11/08/2017  . Elevated AST (SGOT) 11/07/2017  . MRSA bacteremia 11/05/2017  . Weakness generalized 11/04/2017  . Acute cystitis without hematuria   . Chest pain 10/30/2017  . Ischemic cardiomyopathy 10/30/2017  . CAD (coronary artery disease)-DES/LAD 2017 10/30/2017  . Hypertension 10/30/2017  . Hypoxia 10/30/2017  . Acute on chronic systolic heart failure (HCC) 10/30/2017  . Hypothyroidism, adult 10/30/2017  . Permanent atrial fibrillation 10/30/2017  . CKD (chronic kidney disease) stage 3, GFR 30-59 ml/min (HCC) 10/30/2017  . Fever 10/30/2017  . CKD (chronic kidney disease), stage III (HCC) 09/24/2016  . Accelerated hypertension 09/24/2016  . CAD in native artery 09/24/2016  . Acute on chronic diastolic CHF (congestive heart failure) (HCC) 09/23/2016  . 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 Chronic atrial fibrillation   . Epistaxis 10/04/2013  . Anemia 10/04/2013  . Nasal fracture 10/04/2013  . History of CVA (cerebrovascular accident) 10/04/2013  . HTN (hypertension) 10/04/2013  . Hypothyroidism 10/04/2013    Past Surgical History:  Procedure Laterality Date  . CARDIAC CATHETERIZATION N/A 08/19/2016   Procedure: Left Heart Cath and Coronary Angiography;  Surgeon: Lyn Records, MD;  LM 30%, pLAD 50%, dLAD 100% s/p  Synergy 2.5 x 24 mm DES, RCA 20%  . CARDIAC CATHETERIZATION N/A 08/19/2016   Procedure: Coronary  Stent Intervention;  Surgeon: Lyn RecordsHenry W Smith, MD;  SYNERGY DES [id number] drug eluting stent  . CHOLECYSTECTOMY       OB History   None      Home Medications    Prior to Admission medications   Medication Sig Start Date End Date Taking? Authorizing Provider  acetaminophen (TYLENOL) 500 MG tablet Take 500 mg by mouth at bedtime as needed for mild pain.     [provider]  apixaban (ELIQUIS) 2.5 MG TABS tablet TAKE 1 TABLET (2.5 MG TOTAL) BY MOUTH 2 (TWO) TIMES DAILY. 09/03/18   Lyn RecordsSmith, Henry W, MD    carvedilol (COREG) 3.125 MG tablet Take 1 tablet (3.125 mg total) by mouth 2 (two) times daily with a meal. 05/20/18   Lyn RecordsSmith, Henry W, MD  furosemide (LASIX) 20 MG tablet Take 1 tablet (20 mg total) by mouth daily. 03/26/18 03/21/19  Lyn RecordsSmith, Henry W, MD  levothyroxine (SYNTHROID, LEVOTHROID) 88 MCG tablet Take 88 mcg by mouth See admin instructions. Take 1 tablet (88 mcg) by mouth Monday thru Friday mornings (skip Saturday and Sunday)    [provider]  loteprednol (ALREX) 0.2 % SUSP Place 1 drop into both eyes 2 (two) times daily.    [provider]  multivitamin-lutein (OCUVITE-LUTEIN) CAPS capsule Take 1 capsule by mouth daily.    [provider]  nitroGLYCERIN (NITROSTAT) 0.4 MG SL tablet Place 0.4 mg under the tongue every 5 (five) minutes as needed for chest pain.  07/29/16   [provider]  spironolactone (ALDACTONE) 25 MG tablet Take 0.5 tablets (12.5 mg total) by mouth daily. 03/26/18 03/21/19  Lyn RecordsSmith, Henry W, MD  valsartan (DIOVAN) 80 MG tablet Take 1 tablet (80 mg total) by mouth daily. 05/21/18   Lyn RecordsSmith, Henry W, MD    Family History Family History  Problem Relation Age of Onset  . Diabetes Mellitus II Son     Social History Social History   Tobacco Use  . Smoking status: Never Smoker  . Smokeless tobacco: Never Used  Substance Use Topics  . Alcohol use: No  . Drug use: No     Allergies   Patient has no known allergies.   Review of Systems Review of Systems  All other systems reviewed and are negative.    Physical Exam Updated Vital Signs BP (!) 178/118 (BP Location: Left Arm)   Pulse 76   Temp 97.7 F (36.5 C) (Oral)   Resp 17   SpO2 95%   Physical Exam  Constitutional: She is oriented to person, place, and time. She appears well-developed and well-nourished.  HENT:  Head: Normocephalic and atraumatic.  Eyes: Pupils are equal, round, and reactive to light. Conjunctivae and EOM are normal.  Neck: Normal range of motion. Neck  supple.  Cardiovascular: Normal rate and regular rhythm. Exam reveals no gallop and no friction rub.  No murmur heard. Pulmonary/Chest: Effort normal and breath sounds normal. No respiratory distress. She has no wheezes. She has no rales. She exhibits no tenderness.  Abdominal: Soft. Bowel sounds are normal. She exhibits no distension and no mass. There is no tenderness. There is no rebound and no guarding.  Musculoskeletal: Normal range of motion. She exhibits no edema or tenderness.  Neurological: She is alert and oriented to person, place, and time.  Skin: Skin is warm and dry.  Psychiatric: She has a normal mood and affect. Her behavior is normal. Judgment and thought content normal.  Nursing note and vitals reviewed.    ED  Treatments / Results  Labs (all labs ordered are listed, but only abnormal results are displayed) Labs Reviewed  BRAIN NATRIURETIC PEPTIDE - Abnormal; Notable for the following components:      Result Value   B Natriuretic Peptide 245.9 (*)    All other components within normal limits  CBC WITH DIFFERENTIAL/PLATELET - Abnormal; Notable for the following components:   Platelets 140 (*)    All other components within normal limits  COMPREHENSIVE METABOLIC PANEL - Abnormal; Notable for the following components:   Glucose, Bld 136 (*)    Creatinine, Ser 1.41 (*)    Calcium 8.7 (*)    GFR calc non Af Amer 32 (*)    GFR calc Af Amer 37 (*)    All other components within normal limits  I-STAT TROPONIN, ED    EKG EKG Interpretation  Date/Time:  Tuesday September 30 2018 01:59:22 EST Ventricular Rate:  76 PR Interval:    QRS Duration: 82 QT Interval:  390 QTC Calculation: 438 R Axis:   30 Text Interpretation:  Atrial fibrillation Septal infarct , age undetermined Abnormal ECG similar to EKG in Jan  2010 Confirmed by Rochele Raring [phone]) on 09/30/2018 2:06:07 AM   Radiology Dg Chest 2 View  Result Date: 09/30/2018 CLINICAL DATA:  Shortness of breath  since this morning. EXAM: CHEST - 2 VIEW COMPARISON:  11/04/2017. FINDINGS: Stable enlarged cardiac silhouette, hyperexpanded lungs and mildly prominent interstitial markings with peribronchial thickening. Diffuse osteopenia. Thoracolumbar spine degenerative changes. Coronary artery stent. IMPRESSION: No acute abnormality. Stable cardiomegaly and changes of COPD and chronic bronchitis. Electronically Signed   By: Beckie Salts M.D.   On: 09/30/2018 02:34    Procedures Procedures (including critical care time)  Medications Ordered in ED Medications - No data to display   Initial Impression / Assessment and Plan / ED Course  I have reviewed the triage vital signs and the nursing notes.  Pertinent labs & imaging results that were available during my care of the patient were reviewed by me and considered in my medical decision making (see chart for details).     Patient presents with sob yesterday, gradually worsening throughout the day, though no chest pain.  DDx includes ACS, PE, pneumothorax, aortic dissection, esophageal rupture, pericarditis, chest wall pain.  Doubt ACS, normal troponin, no ischemic EKG findings, HEART score is: 4 for age and risk factors.  Low risk for PE, Well's PE score is 0, patient is not tachycardic nor hypoxic.  No evidence of pneumothorax on CXR.  Doubt dissection, no mediastinal widening on CXR, no ripping/tearing chest pain, neurovascularly intact.  Doubt pericarditis, no positional changes, or diffuse ST elevations on EKG.  Pain is not reproducible, doubt MSK.   Patient is currently pain free.   Patient seen by and discussed with Dr. Elesa Massed, who recommends admission for ACS rule out.  All findings were discussed with patient.  Patient understands and agrees with the plan.     Final Clinical Impressions(s) / ED Diagnoses   Final diagnoses:  SOB (shortness of breath)    ED Discharge Orders    None       Roxy Horseman, PA-C 09/30/18 0537      Ward, Layla Maw, DO 09/30/18 [phone]

## 2018-09-30 NOTE — ED Notes (Signed)
Assisted patient to bathroom steady gait, denies increased sob .

## 2018-09-30 NOTE — H&P (Signed)
History and Physical    Chelsea Fuller XBM:[id number] DOB: [birth year] DOA: 09/30/2018  PCP: Nila Nephew, MD Consultants:  Katrinka Blazing - cardiology Patient coming from:  Home - lives alone; [facility]: 5 children, [phone]  Chief Complaint: SOB  HPI: Chelsea Fuller is a 84 y.o. female with medical history significant of CAD, A. fib, on Eliquis, ischemic cardiomyopathy, hypertension, and CKD presenting with SOB.  "I couldn't breathe."  She did not really have chest pain, she just couldn't breathe.  She was very busy yesterday and wouldn't stop to sit down.  She was putting up her Christmas tree and wrapping presents.  She felt bad all day and so she had to rest more frequently than usual.  When she went to bed, she had trouble sleeping and was feeling SOB.  She did have very mild chest tightness.  0030 she finally decided she needed help.  Cough only one time, very little.  No wheezing.  She received oxygen in the ambulance but her lungs were clear and she did not need O2 in the ER.  She is much better without any intervention, unsure why.  She had a big BM in the ER and that also may have helped.   ED Course: Carryover, per Dr. Julian Reil: 84 yo F with h/o CAD. Presents to ED with SOB. Lungs sound clear. Trop neg. EKG unchanged. No CP. No O2 requirement. ED requests admit for ACS rule out.  Review of Systems: As per HPI; otherwise review of systems reviewed and negative.   Ambulatory Status:  Ambulates with a cane or walker, mostly walker  Past Medical History:  Diagnosis Date  . CAD (coronary artery disease)    a. 07/2016: anterior STEMI s/p DES x2 to LAD  . CKD (chronic kidney disease), stage III (HCC)   . CVA (cerebral infarction) 2010  . Dyspnea   . Fall    a. 2014: nasal bone fx.  . Hypertension   . Hypothyroidism   . Ischemic cardiomyopathy    a. EF 25% by cath 08/19/16 then echo showed EF 50-55% 08/20/16.  . Mitral regurgitation    a. mild by echo 07/2016.  . Mitral  stenosis    a. mild by echo 07/2016.  Marland Kitchen Permanent atrial fibrillation    a. not presently on anticoagulation due to STEMI 07/2016 and risk of triple therapy with DAPT + anticoag.  . Tricuspid regurgitation    a. mod by echo 07/2016.    Past Surgical History:  Procedure Laterality Date  . CARDIAC CATHETERIZATION N/A 08/19/2016   Procedure: Left Heart Cath and Coronary Angiography;  Surgeon: Lyn Records, MD;  LM 30%, pLAD 50%, dLAD 100% s/p  Synergy 2.5 x 24 mm DES, RCA 20%  . CARDIAC CATHETERIZATION N/A 08/19/2016   Procedure: Coronary Stent Intervention;  Surgeon: Lyn Records, MD;  SYNERGY DES 2.5X24 drug eluting stent  . CHOLECYSTECTOMY      Social History   Socioeconomic History  . Marital status: Widowed    Spouse name: Not on file  . Number of children: Not on file  . Years of education: Not on file  . Highest education level: Not on file  Occupational History  . Not on file  Social Needs  . Financial resource strain: Not on file  . Food insecurity:    Worry: Not on file    Inability: Not on file  . Transportation needs:    Medical: Not on file    Non-medical: Not on file  Tobacco Use  . Smoking status: Never Smoker  . Smokeless tobacco: Never Used  Substance and Sexual Activity  . Alcohol use: No  . Drug use: No  . Sexual activity: Not Currently  Lifestyle  . Physical activity:    Days per week: Not on file    Minutes per session: Not on file  . Stress: Not on file  Relationships  . Social connections:    Talks on phone: Not on file    Gets together: Not on file    Attends religious service: Not on file    Active member of club or organization: Not on file    Attends meetings of clubs or organizations: Not on file    Relationship status: Not on file  . Intimate partner violence:    Fear of current or ex partner: Not on file    Emotionally abused: Not on file    Physically abused: Not on file    Forced sexual activity: Not on file  Other Topics  Concern  . Not on file  Social History Narrative   Lives independently    No Known Allergies  Family History  Problem Relation Age of Onset  . Diabetes Mellitus II Son     Prior to Admission medications   Medication Sig Start Date End Date Taking? Authorizing Provider  acetaminophen (TYLENOL) 500 MG tablet Take 500 mg by mouth at bedtime as needed for mild pain.    Yes [provider]  apixaban (ELIQUIS) 2.5 MG TABS tablet TAKE 1 TABLET (2.5 MG TOTAL) BY MOUTH 2 (TWO) TIMES DAILY. Patient taking differently: Take 2.5 mg by mouth 2 (two) times daily.  09/03/18  Yes Lyn RecordsSmith, Henry W, MD  carvedilol (COREG) 3.125 MG tablet Take 1 tablet (3.125 mg total) by mouth 2 (two) times daily with a meal. 05/20/18  Yes Lyn RecordsSmith, Henry W, MD  furosemide (LASIX) 20 MG tablet Take 1 tablet (20 mg total) by mouth daily. 03/26/18 03/21/19 Yes Lyn RecordsSmith, Henry W, MD  levothyroxine (SYNTHROID, LEVOTHROID) 88 MCG tablet Take 88 mcg by mouth See admin instructions. Take 1 tablet (88 mcg) by mouth Monday thru Friday mornings (skip Saturday and Sunday)   Yes [provider]  loteprednol (ALREX) 0.2 % SUSP Place 1 drop into both eyes 2 (two) times daily.   Yes [provider]  multivitamin-lutein (OCUVITE-LUTEIN) CAPS capsule Take 1 capsule by mouth daily.   Yes [provider]  nitroGLYCERIN (NITROSTAT) 0.4 MG SL tablet Place 0.4 mg under the tongue every 5 (five) minutes as needed for chest pain.  07/29/16  Yes [provider]  spironolactone (ALDACTONE) 25 MG tablet Take 0.5 tablets (12.5 mg total) by mouth daily. 03/26/18 03/21/19 Yes Lyn RecordsSmith, Henry W, MD  valsartan (DIOVAN) 80 MG tablet Take 40 mg by mouth daily.  05/21/18  Yes Lyn RecordsSmith, Henry W, MD    Physical Exam: Vitals:   09/30/18 0205 09/30/18 0251 09/30/18 0448 09/30/18 1338  BP: (!) 178/118 (!) 142/59 (!) 162/63 (!) 161/64  Pulse: 76 68 75 (!) 55  Resp: 17 18 18  (!) 22  Temp: 97.7 F (36.5 C)   98 F (36.7 C)  TempSrc:  Oral   Oral  SpO2: 95% 95% 95% 95%     General:  Appears calm and comfortable and is NAD Eyes:   EOMI, normal lids, iris ENT:  grossly normal hearing, lips & tongue, mmm Neck:  no LAD, masses or thyromegaly; no carotid bruits Cardiovascular:  Irregularly irregular, rate controlled, no  m/r/g. No LE edema.  Respiratory:   CTA bilaterally with no wheezes/rales/rhonchi.  Normal respiratory effort. Abdomen:  soft, NT, ND, NABS  Skin:  no rash or induration seen on limited exam Musculoskeletal:  grossly normal tone BUE/BLE, good ROM, no bony abnormality Psychiatric:  grossly normal mood and affect, speech fluent and appropriate, AOx3 Neurologic:  CN 2-12 grossly intact, moves all extremities in coordinated fashion, sensation intact    Radiological Exams on Admission: Dg Chest 2 View  Result Date: 09/30/2018 CLINICAL DATA:  Shortness of breath since this morning. EXAM: CHEST - 2 VIEW COMPARISON:  11/04/2017. FINDINGS: Stable enlarged cardiac silhouette, hyperexpanded lungs and mildly prominent interstitial markings with peribronchial thickening. Diffuse osteopenia. Thoracolumbar spine degenerative changes. Coronary artery stent. IMPRESSION: No acute abnormality. Stable cardiomegaly and changes of COPD and chronic bronchitis. Electronically Signed   By: Beckie Salts M.D.   On: 09/30/2018 02:34    EKG: Independently reviewed.  Afib with rate 76; nonspecific ST changes with no evidence of acute ischemia   Labs on Admission: I have personally reviewed the available labs and imaging studies at the time of the admission.  Pertinent labs:   Glucose 136 BUN 19/Creatinine 1.41/GFR 32 - baseline creatinine 1.2-1.3 BNP 245.9; 661.1 in 1/19 Troponin 0.02 Platelets 140, otherwise normal CBC   Assessment/Plan Principal Problem:   Dyspnea Active Problems:   HTN (hypertension)   Hypothyroidism   Chronic atrial fibrillation   CAD (coronary artery disease)-DES/LAD 2017   CKD (chronic kidney  disease) stage 3, GFR 30-59 ml/min (HCC)   Chest pain/SOB with h/o CAD -Patient with spontaneous SOB with mild chest tightness without pain -CXR unremarkable.   -Initial cardiac troponin negative - I was prepared to send the patient home after a second negative but it returned at 0.04 x 2.  Will continue to trend overnight.  -EKG not indicative of acute ischemia.    -Will plan to place in observation status on telemetry to rule out ACS by overnight observation.  -Repeat EKG in AM -morphine given -Likely will need outpatient cardiology f/u with medical management only, still low suspicion for cardiac source and patient had resolution of symptoms after large BM  HTN -Continue home medications - Coreg, Lasix, Spironolactone, Diovan  HLD -She is not taking cholesterol medication -Lipids were checked in 10/17 (TC 187, HDL 64, LDL 113, TG 52) so will not repeat at this time -She is unlikely to benefit from long-term lipid treatment at this time  Mild hyperglycemia -May be stress response -Will follow with fasting AM labs -It is unlikely that she will need acute or chronic treatment for this issue  Afib on Eliquis -Rate controlled on Coreg -Continue Eliquis  Hypothyroidism -Normal TSH in 1/19 -Continue Synthroid at current dose for now  Stage 3 CKD -Appears to be stable at this time  DVT prophylaxis: Eliquis Code Status:  DNR - confirmed with patient/family Family Communication: Daughter present throughout evaluation  Disposition Plan:  Home once clinically improved Consults called: None  Admission status: It is my clinical opinion that referral for OBSERVATION is reasonable and necessary in this patient based on the above information provided. The aforementioned taken together are felt to place the patient at high risk for further clinical deterioration. However it is anticipated that the patient may be medically stable for discharge from the hospital within 24 to 48  hours.    Jonah Blue MD [facility]  If note is complete, please contact covering daytime or nighttime physician. [email or web address] Password [email or web address]  09/30/2018, 5:13 PM

## 2018-09-30 NOTE — ED Notes (Signed)
Patient transported to X-ray 

## 2018-09-30 NOTE — ED Triage Notes (Signed)
Pt arrives to ED from home with complaints of shortness of breath since this morning. EMS reports pt called her daughters who called EMS to come see her. When EMS arrived pt RR was 33. Pt given 2L O2 for comfort and RR decreased. Lungs clear. Pt lives by herself at home. Pt states last time she felt short of breath like this was when she had a heart attack. BP 186/80

## 2018-09-30 NOTE — ED Notes (Signed)
Assisted patient to the restroom, pt ambulatory with one person assist.

## 2018-09-30 NOTE — ED Notes (Signed)
Pt ambulated to bathroom with a steady gait with assistance

## 2018-09-30 NOTE — ED Provider Notes (Signed)
Medical screening examination/treatment/procedure(s) were conducted as a shared visit with non-physician practitioner(s) and myself.  I personally evaluated the patient during the encounter.  EKG Interpretation  Date/Time:  Tuesday September 30 2018 01:59:22 EST Ventricular Rate:  76 PR Interval:    QRS Duration: 82 QT Interval:  390 QTC Calculation: 438 R Axis:   30 Text Interpretation:  Atrial fibrillation Septal infarct , age undetermined Abnormal ECG similar to EKG in Jan  2010 Confirmed by Rochele RaringWard, Tyshae Stair [phone]([id number]) on 09/30/2018 2:06:07 AM   Patient is a 84 year old female with cardiac history who presents to the emergency department with chest tightness and shortness of breath that have resolved.  Troponin negative.  Chest x-ray shows no pulmonary edema or pneumonia.  Patient states she is feeling better.  Will admit for chest pain rule out.   Orvan Papadakis, Layla MawKristen N, DO 09/30/18 [phone]

## 2018-09-30 NOTE — ED Notes (Signed)
ED Provider at bedside. 

## 2018-09-30 NOTE — ED Notes (Signed)
Pt returned from xray

## 2018-09-30 NOTE — ED Notes (Signed)
Pt family at bedside

## 2018-10-01 DIAGNOSIS — I10 Essential (primary) hypertension: Secondary | ICD-10-CM

## 2018-10-01 DIAGNOSIS — E039 Hypothyroidism, unspecified: Secondary | ICD-10-CM | POA: Diagnosis not present

## 2018-10-01 DIAGNOSIS — I129 Hypertensive chronic kidney disease with stage 1 through stage 4 chronic kidney disease, or unspecified chronic kidney disease: Secondary | ICD-10-CM | POA: Diagnosis not present

## 2018-10-01 DIAGNOSIS — R0600 Dyspnea, unspecified: Secondary | ICD-10-CM | POA: Diagnosis not present

## 2018-10-01 DIAGNOSIS — I482 Chronic atrial fibrillation, unspecified: Secondary | ICD-10-CM | POA: Diagnosis not present

## 2018-10-01 DIAGNOSIS — I2510 Atherosclerotic heart disease of native coronary artery without angina pectoris: Secondary | ICD-10-CM

## 2018-10-01 DIAGNOSIS — R0789 Other chest pain: Secondary | ICD-10-CM | POA: Diagnosis not present

## 2018-10-01 DIAGNOSIS — N183 Chronic kidney disease, stage 3 (moderate): Secondary | ICD-10-CM | POA: Diagnosis not present

## 2018-10-01 LAB — MRSA PCR SCREENING: MRSA by PCR: NEGATIVE

## 2018-10-01 LAB — TROPONIN I: TROPONIN I: 0.03 ng/mL — AB (ref <0.03)

## 2018-10-01 MED ORDER — PROSIGHT PO TABS
1.0000 | ORAL_TABLET | Freq: Every day | ORAL | Status: DC
  Administered 2018-10-01: 1 via ORAL
  Filled 2018-10-01: qty 1

## 2018-10-01 NOTE — Discharge Summary (Signed)
Physician Discharge Summary  Chelsea Fuller ZOX:[id number] DOB: 1924/05/17 DOA: 09/30/2018  PCP: Nila Nephew, MD  Admit date: 09/30/2018  Discharge date: 10/01/2018  Admitted From: Home  Disposition:  Home  Discharge Condition: Stable  CODE STATUS:  Full  Diet recommendation: Heart Healthy   Brief/Interim Summary:  Patient is a 84 years old female with past medical history of coronary artery disease, atrial fibrillation on Eliquis, ischemic cardiomyopathy, hypertension, chronic kidney disease presented to the hospital with shortness of breath.  She did have mild chest tightness as well.  Patient was given some supplemental oxygen and was brought into the hospital.  Had borderline elevated troponin which were flat but she did not have any chest pain or any ischemic changes in the EKG.  She did rule out for acute coronary syndrome.  Patient did not have any further symptoms while in the hospital including dyspnea nor she did require any supplemental oxygen.. Creatinine was slightly elevated at baseline.  Spoke with patient's family at bedside.  Further intervention was planned after discussing with the family. Chest x-ray was negative for acute findings.  Patient had atrial fibrillation which was rate controlled.  Patient was then considered stable for disposition home.  I also spoke with the patient's family at bedside regarding the plan for disposition and the need for outpatient follow-up with primary care physician.   Discharge Diagnoses:  Principal Problem:   Dyspnea Active Problems:   HTN (hypertension)   Hypothyroidism   Chronic atrial fibrillation   CAD (coronary artery disease)-DES/LAD 2017   CKD (chronic kidney disease) stage 3, GFR 30-59 ml/min [facility])   Discharge Instructions  Discharge Instructions    Diet - low sodium heart healthy   Complete by:  As directed    Discharge instructions   Complete by:  As directed    Follow up with your primary care provider in  one week.   Increase activity slowly   Complete by:  As directed      Allergies as of 10/01/2018   No Known Allergies     Medication List    TAKE these medications   acetaminophen 500 MG tablet Commonly known as:  TYLENOL Take 500 mg by mouth at bedtime as needed for mild pain.   ALREX 0.2 % Susp Generic drug:  loteprednol Place 1 drop into both eyes 2 (two) times daily.   apixaban 2.5 MG Tabs tablet Commonly known as:  ELIQUIS TAKE 1 TABLET (2.5 MG TOTAL) BY MOUTH 2 (TWO) TIMES DAILY. What changed:    how much to take  how to take this  when to take this  additional instructions   carvedilol 3.125 MG tablet Commonly known as:  COREG Take 1 tablet (3.125 mg total) by mouth 2 (two) times daily with a meal.   furosemide 20 MG tablet Commonly known as:  LASIX Take 1 tablet (20 mg total) by mouth daily.   levothyroxine 88 MCG tablet Commonly known as:  SYNTHROID, LEVOTHROID Take 88 mcg by mouth See admin instructions. Take 1 tablet (88 mcg) by mouth Monday thru Friday mornings (skip Saturday and Sunday)   multivitamin-lutein Caps capsule Take 1 capsule by mouth daily.   nitroGLYCERIN 0.4 MG SL tablet Commonly known as:  NITROSTAT Place 0.4 mg under the tongue every 5 (five) minutes as needed for chest pain.   spironolactone 25 MG tablet Commonly known as:  ALDACTONE Take 0.5 tablets (12.5 mg total) by mouth daily.   valsartan 80 MG tablet Commonly known as:  DIOVAN Take 40 mg by mouth daily.       No Known Allergies  Consultations:  None   Procedures/Studies: Dg Chest 2 View  Result Date: 09/30/2018 CLINICAL DATA:  Shortness of breath since this morning. EXAM: CHEST - 2 VIEW COMPARISON:  11/04/2017. FINDINGS: Stable enlarged cardiac silhouette, hyperexpanded lungs and mildly prominent interstitial markings with peribronchial thickening. Diffuse osteopenia. Thoracolumbar spine degenerative changes. Coronary artery stent. IMPRESSION: No acute  abnormality. Stable cardiomegaly and changes of COPD and chronic bronchitis. Electronically Signed   By: Beckie SaltsSteven  Reid M.D.   On: 09/30/2018 02:34      Subjective:  Patient denies any chest pain, shortness of breath, fever or chills.  Discharge Exam: Vitals:   10/01/18 0003 10/01/18 0637  BP: (!) 160/64 (!) 185/56  Pulse: 61 70  Resp: 18 16  Temp: 98.1 F (36.7 C) 98.3 F (36.8 C)  SpO2: 96% 95%   Vitals:   09/30/18 1338 09/30/18 2055 10/01/18 0003 10/01/18 0637  BP: (!) 161/64 (!) 165/65 (!) 160/64 (!) 185/56  Pulse: (!) 55 (!) 58 61 70  Resp: (!) 22 18 18 16   Temp: 98 F (36.7 C) 98 F (36.7 C) 98.1 F (36.7 C) 98.3 F (36.8 C)  TempSrc: Oral Oral Oral Oral  SpO2: 95% 94% 96% 95%  Weight:   83.9 kg   Height:   5\' 7"  (1.702 m)     General: Pt is alert, awake, not in acute distress Cardiovascular: RRR, S1/S2 +, no rubs, no gallops Respiratory: CTA bilaterally, no wheezing, no rhonchi Abdominal: Soft, NT, ND, bowel sounds + CNS: non focal. Extremities: no edema, no cyanosis  The results of significant diagnostics from this hospitalization (including imaging, microbiology, ancillary and laboratory) are listed below for reference.    Microbiology: No results found for this or any previous visit (from the past 240 hour(s)).   Labs: BNP (last 3 results) Recent Labs    10/30/17 1254 09/30/18 0209  BNP 661.1* 245.9*   Basic Metabolic Panel: Recent Labs  Lab 09/30/18 0209  NA 141  K 3.9  CL 104  CO2 24  GLUCOSE 136*  BUN 19  CREATININE 1.41*  CALCIUM 8.7*   Liver Function Tests: Recent Labs  Lab 09/30/18 0209  AST 20  ALT 12  ALKPHOS 41  BILITOT 1.1  PROT 6.5  ALBUMIN 3.6   No results for input(s): LIPASE, AMYLASE in the last 168 hours. No results for input(s): AMMONIA in the last 168 hours. CBC: Recent Labs  Lab 09/30/18 0209  WBC 4.9  NEUTROABS 2.6  HGB 12.0  HCT 37.7  MCV 97.4  PLT 140*   Cardiac Enzymes: Recent Labs  Lab  09/30/18 0729 09/30/18 1354 09/30/18 1928 10/01/18 0210  TROPONINI 0.04* 0.04* 0.04* 0.03*   BNP: Invalid input(s): POCBNP CBG: No results for input(s): GLUCAP in the last 168 hours. D-Dimer No results for input(s): DDIMER in the last 72 hours. Hgb A1c No results for input(s): HGBA1C in the last 72 hours. Lipid Profile No results for input(s): CHOL, HDL, LDLCALC, TRIG, CHOLHDL, LDLDIRECT in the last 72 hours. Thyroid function studies No results for input(s): TSH, T4TOTAL, T3FREE, THYROIDAB in the last 72 hours.  Invalid input(s): FREET3 Anemia work up No results for input(s): VITAMINB12, FOLATE, FERRITIN, TIBC, IRON, RETICCTPCT in the last 72 hours. Urinalysis    Component Value Date/Time   COLORURINE YELLOW 11/04/2017 2002   APPEARANCEUR HAZY (A) 11/04/2017 2002   LABSPEC 1.009 11/04/2017 2002   PHURINE 5.0  11/04/2017 2002   GLUCOSEU NEGATIVE 11/04/2017 2002   HGBUR MODERATE (A) 11/04/2017 2002   BILIRUBINUR NEGATIVE 11/04/2017 2002   KETONESUR NEGATIVE 11/04/2017 2002   PROTEINUR NEGATIVE 11/04/2017 2002   UROBILINOGEN 1.0 11/18/2008 1705   NITRITE NEGATIVE 11/04/2017 2002   LEUKOCYTESUR SMALL (A) 11/04/2017 2002   Sepsis Labs Invalid input(s): PROCALCITONIN,  WBC,  LACTICIDVEN Microbiology No results found for this or any previous visit (from the past 240 hour(s)).  Please note: You were cared for by a hospitalist during your hospital stay. Once you are discharged, your primary care physician will handle any further medical issues.   Time coordinating discharge: 40 minutes  SIGNED:  Joycelyn Das, MD  [facility] 10/01/2018, 9:57 AM

## 2018-10-01 NOTE — Care Management Obs Status (Signed)
MEDICARE OBSERVATION STATUS NOTIFICATION   Patient Details  Name: Chelsea NapKathleen L Wrightsman MRN: [id number] Date of Birth: 10/03/1924   Medicare Observation Status Notification Given:  Yes    Colleen CanNatalie Joyanna Kleman RN, BSN, NCM-BC, ACM-RN [phone] 10/01/2018, 10:36 AM

## 2018-10-01 NOTE — Progress Notes (Signed)
Patient given discharge instructions and verbalized understanding.  Patient stable to discharge at this time.  Patient is alert and oriented to baseline.  No distressed noted at this time.  All belongings taken with the patient at discharge.   

## 2018-10-01 NOTE — Discharge Instructions (Signed)

## 2018-11-16 ENCOUNTER — Other Ambulatory Visit: Payer: Self-pay | Admitting: Interventional Cardiology

## 2019-02-20 ENCOUNTER — Other Ambulatory Visit: Payer: Self-pay | Admitting: Interventional Cardiology

## 2019-02-20 MED ORDER — SPIRONOLACTONE 25 MG PO TABS: 12.5000 mg | ORAL_TABLET | Freq: Every day | ORAL | 0 refills | Status: DC

## 2019-03-01 ENCOUNTER — Other Ambulatory Visit: Payer: Self-pay | Admitting: Interventional Cardiology

## 2019-05-10 ENCOUNTER — Other Ambulatory Visit: Payer: Self-pay | Admitting: Interventional Cardiology

## 2019-05-16 ENCOUNTER — Other Ambulatory Visit: Payer: Self-pay | Admitting: Interventional Cardiology

## 2019-05-18 ENCOUNTER — Other Ambulatory Visit: Payer: Self-pay | Admitting: Interventional Cardiology

## 2019-05-18 ENCOUNTER — Other Ambulatory Visit: Payer: Self-pay

## 2019-06-01 ENCOUNTER — Telehealth: Payer: Self-pay

## 2019-06-01 NOTE — Telephone Encounter (Signed)
YOUR CARDIOLOGY TEAM HAS ARRANGED FOR AN E-VISIT FOR YOUR APPOINTMENT - PLEASE REVIEW IMPORTANT INFORMATION BELOW SEVERAL DAYS PRIOR TO YOUR APPOINTMENT ° °Due to the recent COVID-19 pandemic, we are transitioning in-person office visits to tele-medicine visits in an effort to decrease unnecessary exposure to our patients, their families, and staff. These visits are billed to your insurance just like a normal visit is. We also encourage you to sign up for MyChart if you have not already done so. You will need a smartphone if possible. For patients that do not have this, we can still complete the visit using a regular telephone but do prefer a smartphone to enable video when possible. You may have a family member that lives with you that can help. If possible, we also ask that you have a blood pressure cuff and scale at home to measure your blood pressure, heart rate and weight prior to your scheduled appointment. Patients with clinical needs that need an in-person evaluation and testing will still be able to come to the office if absolutely necessary. If you have any questions, feel free to call our office. ° ° ° ° °YOUR PROVIDER WILL BE USING THE FOLLOWING PLATFORM TO COMPLETE YOUR VISIT: Phone Call ° °• IF USING MYCHART - How to Download the MyChart App to Your SmartPhone  ° °- If Apple, go to App Store and type in MyChart in the search bar and download the app. If Android, ask patient to go to Google Play Store and type in MyChart in the search bar and download the app. The app is free but as with any other app downloads, your phone may require you to verify saved payment information or Apple/Android password.  °- You will need to then log into the app with your MyChart username and password, and select [facility] as your healthcare provider to link the account.  °- When it is time for your visit, go to the MyChart app, find appointments, and click Begin Video Visit. Be sure to Select Allow for your device to  access the Microphone and Camera for your visit. You will then be connected, and your provider will be with you shortly. ° **If you have any issues connecting or need assistance, please contact MyChart service desk [phone] ([phone])** ° **If using a computer, in order to ensure the best quality for your visit, you will need to use either of the following Internet Browsers: Google Chrome or Microsoft Edge** ° °• IF USING DOXIMITY or [email or web address] - The staff will give you instructions on receiving your link to join the meeting the day of your visit.  ° ° ° ° °2-3 DAYS BEFORE YOUR APPOINTMENT ° °You will receive a telephone call from one of our [facility] team members - your caller ID may say "Unknown caller." If this is a video visit, we will walk you through how to get the video launched on your phone. We will remind you check your blood pressure, heart rate and weight prior to your scheduled appointment. If you have an Apple Watch or Kardia, please upload any pertinent ECG strips the day before or morning of your appointment to MyChart. Our staff will also make sure you have reviewed the consent and agree to move forward with your scheduled tele-health visit.  ° ° ° °THE DAY OF YOUR APPOINTMENT ° °Approximately 15 minutes prior to your scheduled appointment, you will receive a telephone call from one of [facility] team - your caller ID may say "Unknown caller."    Our staff will confirm medications, vital signs for the day and any symptoms you may be experiencing. Please have this information available prior to the time of visit start. It may also be helpful for you to have a pad of paper and pen handy for any instructions given during your visit. They will also walk you through joining the smartphone meeting if this is a video visit. ° ° ° °CONSENT FOR TELE-HEALTH VISIT - PLEASE REVIEW ° °I hereby voluntarily request, consent and authorize [facility] and its employed or contracted physicians, physician  assistants, nurse practitioners or other licensed health care professionals (the Practitioner), to provide me with telemedicine health care services (the “Services") as deemed necessary by the treating Practitioner. I acknowledge and consent to receive the Services by the Practitioner via telemedicine. I understand that the telemedicine visit will involve communicating with the Practitioner through live audiovisual communication technology and the disclosure of certain medical information by electronic transmission. I acknowledge that I have been given the opportunity to request an in-person assessment or other available alternative prior to the telemedicine visit and am voluntarily participating in the telemedicine visit. ° °I understand that I have the right to withhold or withdraw my consent to the use of telemedicine in the course of my care at any time, without affecting my right to future care or treatment, and that the Practitioner or I may terminate the telemedicine visit at any time. I understand that I have the right to inspect all information obtained and/or recorded in the course of the telemedicine visit and may receive copies of available information for a reasonable fee.  I understand that some of the potential risks of receiving the Services via telemedicine include:  °• Delay or interruption in medical evaluation due to technological equipment failure or disruption; °• Information transmitted may not be sufficient (e.g. poor resolution of images) to allow for appropriate medical decision making by the Practitioner; and/or  °• In rare instances, security protocols could fail, causing a breach of personal health information. ° °Furthermore, I acknowledge that it is my responsibility to provide information about my medical history, conditions and care that is complete and accurate to the best of my ability. I acknowledge that Practitioner's advice, recommendations, and/or decision may be based on  factors not within their control, such as incomplete or inaccurate data provided by me or distortions of diagnostic images or specimens that may result from electronic transmissions. I understand that the practice of medicine is not an exact science and that Practitioner makes no warranties or guarantees regarding treatment outcomes. I acknowledge that I will receive a copy of this consent concurrently upon execution via email to the email address I last provided but may also request a printed copy by calling the office of [facility].   ° °I understand that my insurance will be billed for this visit.  ° °I have read or had this consent read to me. °• I understand the contents of this consent, which adequately explains the benefits and risks of the Services being provided via telemedicine.  °• I have been provided ample opportunity to ask questions regarding this consent and the Services and have had my questions answered to my satisfaction. °• I give my informed consent for the services to be provided through the use of telemedicine in my medical care ° °By participating in this telemedicine visit I agree to the above. °

## 2019-06-02 NOTE — Progress Notes (Signed)
Virtual Visit via Video Note   This visit type was conducted due to national recommendations for restrictions regarding the COVID-19 Pandemic (e.g. social distancing) in an effort to limit this patient's exposure and mitigate transmission in our community.  Due to her co-morbid illnesses, this patient is at least at moderate risk for complications without adequate follow up.  This format is felt to be most appropriate for this patient at this time.  All issues noted in this document were discussed and addressed.  A limited physical exam was performed with this format.  Please refer to the patient's chart for her consent to telehealth for [facility].   Date:  06/03/2019   ID:  Chelsea Fuller, DOB 02-01-1924, MRN [id number]  Patient Location: Home Provider Location: Office  PCP:  Levin Erp, MD  Cardiologist:  Sinclair Grooms, MD  Electrophysiologist:  None   Evaluation Performed:  Follow-Up Visit  Chief Complaint:  Follow up, CAD seen for Dr. Tamala Julian  History of Present Illness:    Chelsea Fuller is a 85 y.o. female with a history of hypertension, chronic diastolic HF, hypothyroidism, chronic atrial fibrillation (felt to high risk for [facility]), CKD and stroke (2010) and recent diagnosis of CAD s/p STEMI with DES x2 to LAD.  Initial LVEF noted to be 25% improved to greater than 50% per echocardiogram.  She was last seen by Dr. Tamala Julian 03/26/2018.  At that time, she was feeling well from a cardiac perspective however had some confusion about her diuretic therapy.  Her Lasix had recently been decreased to 20 mg/day secondary to elevated creatinine.  Her PCP, Dr. Nyoka Cowden increased the medication back to 40 mg/day due to elevated BP.  When seen by Dr. Tamala Julian, Lasix was again decreased to 20 mg/day with the addition of Aldactone 12.5 mg.  She had some episodes of intermittent angina that would occur spontaneously and lasts less than 3 minutes in duration.  Has been followed by the  hypertension clinic in the past, last seen 06/17/2017 in which her carvedilol was decreased to 3.125 mg twice daily due to instability while walking.  Her valsartan was increased to 80 mg given stable renal function.  Today she presents via virtual visit with her daughter at her side.  She is been well from a cardiac perspective.  Denies anginal symptoms, shortness of breath, palpitations, LE swelling, orthopnea, PND, dizziness or syncope.  She has been compliant with her medications including Eliquis.  She has had no signs or symptoms of acute bleeding in stool or urine.  She has been doing well on her reduced dose of Lasix and spironolactone.  Has some complaints of fatigue however we discussed that being 85 years old this may be her new baseline.  She understands.  She continues to do housework and take small breaks and is fine with that.   The patient does not have symptoms concerning for COVID-19 infection (fever, chills, cough, or new shortness of breath).    Past Medical History:  Diagnosis Date  . CAD (coronary artery disease)    a. 07/2016: anterior STEMI s/p DES x2 to LAD  . CKD (chronic kidney disease), stage III ([place])   . CVA (cerebral infarction) 2010  . Dyspnea   . Fall    a. 2014: nasal bone fx.  . Hypertension   . Hypothyroidism   . Ischemic cardiomyopathy    a. EF 25% by cath 08/19/16 then echo showed EF 50-55% 08/20/16.  . Mitral regurgitation  a. mild by echo 07/2016.  . Mitral stenosis    a. mild by echo 07/2016.  Marland Kitchen. Permanent atrial fibrillation    a. not presently on anticoagulation due to STEMI 07/2016 and risk of triple therapy with DAPT + anticoag.  . Tricuspid regurgitation    a. mod by echo 07/2016.   Past Surgical History:  Procedure Laterality Date  . CARDIAC CATHETERIZATION N/A 08/19/2016   Procedure: Left Heart Cath and Coronary Angiography;  Surgeon: Lyn RecordsHenry W Smith, MD;  LM 30%, pLAD 50%, dLAD 100% s/p  Synergy 2.5 x 24 mm DES, RCA 20%  . CARDIAC  CATHETERIZATION N/A 08/19/2016   Procedure: Coronary Stent Intervention;  Surgeon: Lyn RecordsHenry W Smith, MD;  SYNERGY DES 2.5X24 drug eluting stent  . CHOLECYSTECTOMY       Current Meds  Medication Sig  . acetaminophen (TYLENOL) 500 MG tablet Take 500 mg by mouth at bedtime as needed for mild pain.   Marland Kitchen. apixaban (ELIQUIS) 2.5 MG TABS tablet Take 1 tablet (2.5 mg total) by mouth 2 (two) times daily.  . carvedilol (COREG) 3.125 MG tablet TAKE 1 TABLET (3.125 MG TOTAL) BY MOUTH 2 (TWO) TIMES DAILY WITH A MEAL.  . furosemide (LASIX) 20 MG tablet Take 1 tablet (20 mg total) by mouth daily.  Marland Kitchen. levothyroxine (SYNTHROID, LEVOTHROID) 88 MCG tablet Take 88 mcg by mouth See admin instructions. Take 1 tablet (88 mcg) by mouth Monday thru Friday mornings (skip Saturday and Sunday)  . loteprednol (ALREX) 0.2 % SUSP Place 1 drop into both eyes 2 (two) times daily.  . nitroGLYCERIN (NITROSTAT) 0.4 MG SL tablet Place 0.4 mg under the tongue every 5 (five) minutes as needed for chest pain.   Marland Kitchen. spironolactone (ALDACTONE) 25 MG tablet TAKE 1/2 A TABLET BY MOUTH DAILY. PLEASE MAKE YEARLY APPT WITH DR. Katrinka BlazingSMITH FOR JUNE FOR FUTURE REFILLS.  Marland Kitchen. valsartan (DIOVAN) 40 MG tablet Take 1 tablet (40 mg total) by mouth daily. Please schedule appt for future refills..3rd attempt     Allergies:   Patient has no known allergies.   Social History   Tobacco Use  . Smoking status: Never Smoker  . Smokeless tobacco: Never Used  Substance Use Topics  . Alcohol use: No  . Drug use: No     Family Hx: The patient's family history includes Diabetes Mellitus II in her son.  ROS:   Please see the history of present illness.     All other systems reviewed and are negative.  Prior CV studies:   The following studies were reviewed today:  Echocardiogram 11/06/2017: Study Conclusions  - Left ventricle: The cavity size was normal. Systolic function was   normal. The estimated ejection fraction was in the range of 50%   to 55%.  Wall motion was normal; there were no regional wall   motion abnormalities. Features are consistent with a pseudonormal   left ventricular filling pattern, with concomitant abnormal   relaxation and increased filling pressure (grade 2 diastolic   dysfunction). Doppler parameters are consistent with elevated   ventricular end-diastolic filling pressure. - Aortic valve: Trileaflet; mildly thickened, mildly calcified   leaflets. There was moderate regurgitation. - Mitral valve: Calcified annulus. Moderately thickened, severely   calcified leaflets . There was mild regurgitation. - Left atrium: The atrium was moderately dilated. - Right ventricle: Systolic function was normal. - Tricuspid valve: There was mild regurgitation. - Pulmonary arteries: Systolic pressure was within the normal   range. - Inferior vena cava: The vessel was normal  in size. - Pericardium, extracardiac: There was no pericardial effusion.  Impressions:  - There was no evidence of a vegetation  Left heart cath 08/19/2016:  Prox RCA to Dist RCA lesion, 20 %stenosed.  Prox LAD lesion, 50 %stenosed.  LM lesion, 30 %stenosed.  A STENT SYNERGY DES 2.25X32 drug eluting stent was successfully placed.  Dist LAD lesion, 100 %stenosed.  Post intervention, there is a 0% residual stenosis.  A STENT SYNERGY DES 2.5X24 drug eluting stent was successfully placed, and does not overlap previously placed stent.  Mid LAD lesion, 80 %stenosed.  Post intervention, there is a 0% residual stenosis.  There is moderate to severe left ventricular systolic dysfunction.  The left ventricular ejection fraction is 25-35% by visual estimate.  LV end diastolic pressure is moderately elevated.    Acute anterior ST elevation myocardial infarction.  Successful PCI with stenting of the mid LAD from 100% to 0% with a Synergy 2.25 x 32 drug-eluting stent with TIMI grade 3 flow.  Successful primary stenting of a segmental 80% proximal  LAD stenosis to 0% using a 2.5 x 24 Synergy drug-eluting stent.  Irregularities noted in the circumflex and right coronary.  Significant anteroapical wall motion abnormality/akinesis with estimated EF 25-35% with LVEDP 24 mmHg.  RECOMMENDATIONS:   Aspirin and Plavix times at least one year  Convert diltiazem to metoprolol for rate control of atrial fibrillation and in the setting of ischemic heart disease.  Follow kidney function closely as patient had pre-contrast exposure chronic kidney disease. A total of 300 cc of contrast was used. She would be at high risk for developing acute kidney injury.  If dyspnea, IV Lasix should be given.  Gentle hydration  At high risk for complications given age ankle morbidities.  Labs/Other Tests and Data Reviewed:    EKG:  No ECG reviewed.  Recent Labs: 09/30/2018: ALT 12; B Natriuretic Peptide 245.9; BUN 19; Creatinine, Ser 1.41; Hemoglobin 12.0; Platelets 140; Potassium 3.9; Sodium 141   Recent Lipid Panel Lab Results  Component Value Date/Time   CHOL 187 08/19/2016 03:28 AM   TRIG 52 08/19/2016 03:28 AM   HDL 64 08/19/2016 03:28 AM   CHOLHDL 2.9 08/19/2016 03:28 AM   LDLCALC 113 (H) 08/19/2016 03:28 AM    Wt Readings from Last 3 Encounters:  06/03/19 137 lb (62.1 kg)  10/01/18 185 lb (83.9 kg)  03/26/18 136 lb 12.8 oz (62.1 kg)     Objective:    Vital Signs:  BP 128/64   Pulse 68   Ht 5\' 7"  (1.702 m)   Wt 137 lb (62.1 kg)   BMI 21.46 kg/m    VITAL SIGNS:  reviewed GEN:  no acute distress EYES:  sclerae anicteric, EOMI - Extraocular Movements Intact RESPIRATORY:  normal respiratory effort, symmetric expansion CARDIOVASCULAR:  no peripheral edema SKIN:  no rash, lesions or ulcers. NEURO:  alert and oriented x 3, no obvious focal deficit PSYCH:  normal affect  ASSESSMENT & PLAN:    1.  CAD with stable angina: -Continues to have mild, fleeting chest pains, not related to exertion felt to be gas per patient report   -No other associated symptoms -Continue current regimen  2.  Chronic atrial fibrillation: -Seen virtually, unable to obtain EKG -Denies palpitations  3.  Essential hypertension: -Has been followed in the hypertension clinic in the past, last seen 06/17/2017 in which her carvedilol was decreased to 3.125 mg twice daily and valsartan was increased to 80 mg -BP stable today  4.  CKD stage III: -To have wellness check with labs tomorrow by PCP 06/04/2019 -patient to have labs sent to our office  5.  Chronic combined systolic and diastolic heart failure: -Initial LVEF status post STEMI noted to be 25% with improvement to 50% per echocardiogram -Does not have HF symptoms -Appears euvolemic on virtual exam   COVID-19 Education: The signs and symptoms of COVID-19 were discussed with the patient and how to seek care for testing (follow up with PCP or arrange E-visit).  The importance of social distancing was discussed today.  Time:   Today, I have spent 20 minutes with the patient with telehealth technology discussing the above problems.     Medication Adjustments/Labs and Tests Ordered: Current medicines are reviewed at length with the patient today.  Concerns regarding medicines are outlined above.   Tests Ordered: No orders of the defined types were placed in this encounter.   Medication Changes: No orders of the defined types were placed in this encounter.   Follow Up:  Virtual Visit or In Person Dr. Katrinka BlazingSmith in 6 months  Signed, Georgie ChardJill Vinod Mikesell, NP  06/03/2019 2:35 PM    [facility]

## 2019-06-03 ENCOUNTER — Telehealth (INDEPENDENT_AMBULATORY_CARE_PROVIDER_SITE_OTHER): Payer: Medicare Other | Admitting: Cardiology

## 2019-06-03 ENCOUNTER — Encounter: Payer: Self-pay | Admitting: Cardiology

## 2019-06-03 ENCOUNTER — Other Ambulatory Visit: Payer: Self-pay

## 2019-06-03 VITALS — BP 128/64 | HR 68 | Ht 67.0 in | Wt 137.0 lb

## 2019-06-03 DIAGNOSIS — I10 Essential (primary) hypertension: Secondary | ICD-10-CM

## 2019-06-03 DIAGNOSIS — N183 Chronic kidney disease, stage 3 unspecified: Secondary | ICD-10-CM

## 2019-06-03 DIAGNOSIS — Z79899 Other long term (current) drug therapy: Secondary | ICD-10-CM

## 2019-06-03 DIAGNOSIS — I2510 Atherosclerotic heart disease of native coronary artery without angina pectoris: Secondary | ICD-10-CM

## 2019-06-03 DIAGNOSIS — I482 Chronic atrial fibrillation, unspecified: Secondary | ICD-10-CM

## 2019-06-03 DIAGNOSIS — I255 Ischemic cardiomyopathy: Secondary | ICD-10-CM

## 2019-06-03 MED ORDER — CARVEDILOL 3.125 MG PO TABS: 3.1250 mg | ORAL_TABLET | Freq: Two times a day (BID) | ORAL | 3 refills | Status: DC

## 2019-06-03 MED ORDER — VALSARTAN 40 MG PO TABS: 40.0000 mg | ORAL_TABLET | Freq: Every day | ORAL | 3 refills | Status: DC

## 2019-06-03 MED ORDER — APIXABAN 2.5 MG PO TABS: 2.5000 mg | ORAL_TABLET | Freq: Two times a day (BID) | ORAL | 3 refills | Status: DC

## 2019-06-03 MED ORDER — SPIRONOLACTONE 25 MG PO TABS: ORAL_TABLET | ORAL | 3 refills | Status: DC

## 2019-06-03 NOTE — Patient Instructions (Signed)

## 2019-08-12 IMAGING — DX DG CHEST 2V
2 series · 2 of 2 positions shown · non-contrast
Comparison: 10/30/2017

CLINICAL DATA: Fever.

EXAM:
CHEST  2 VIEW

[chest lat]
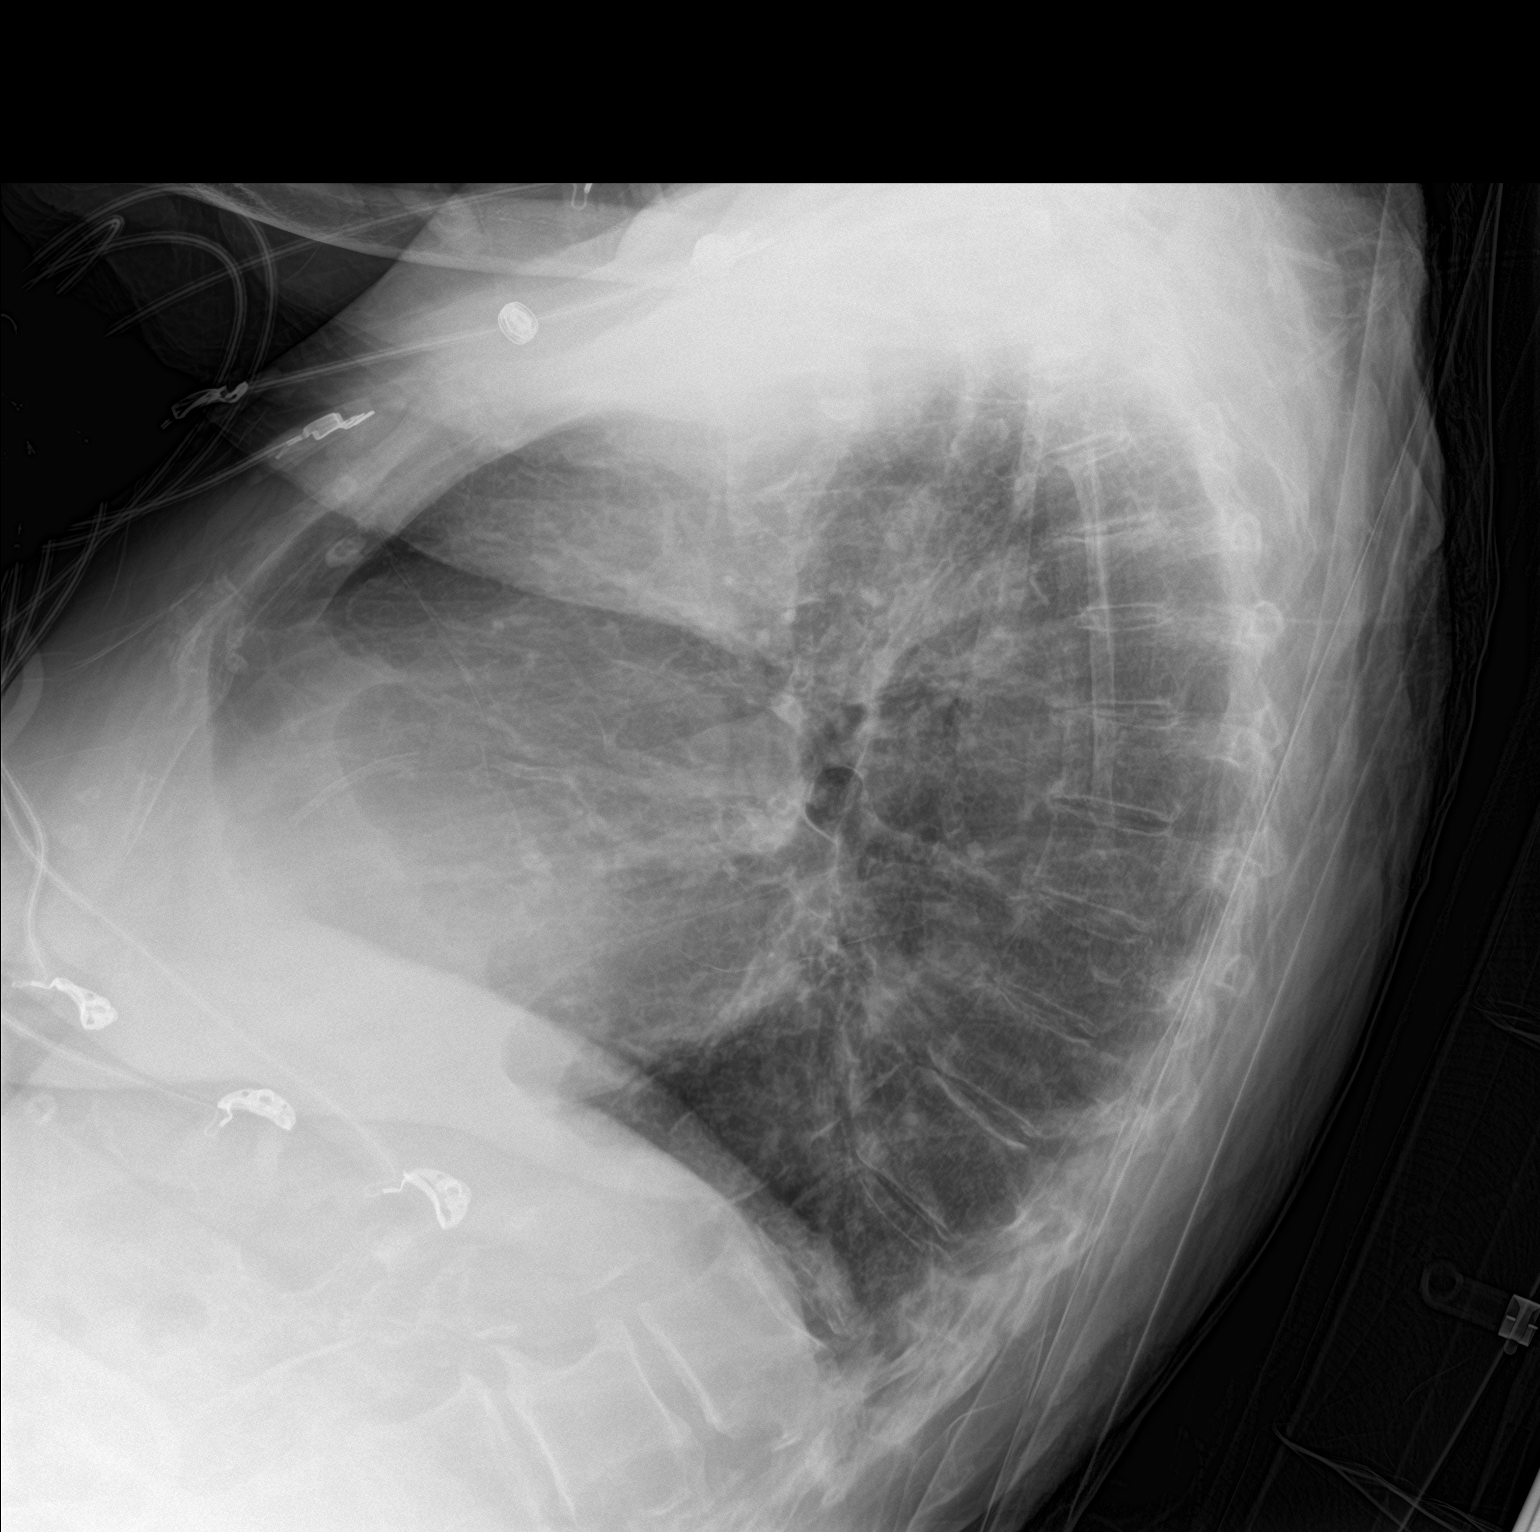

[chest ap]
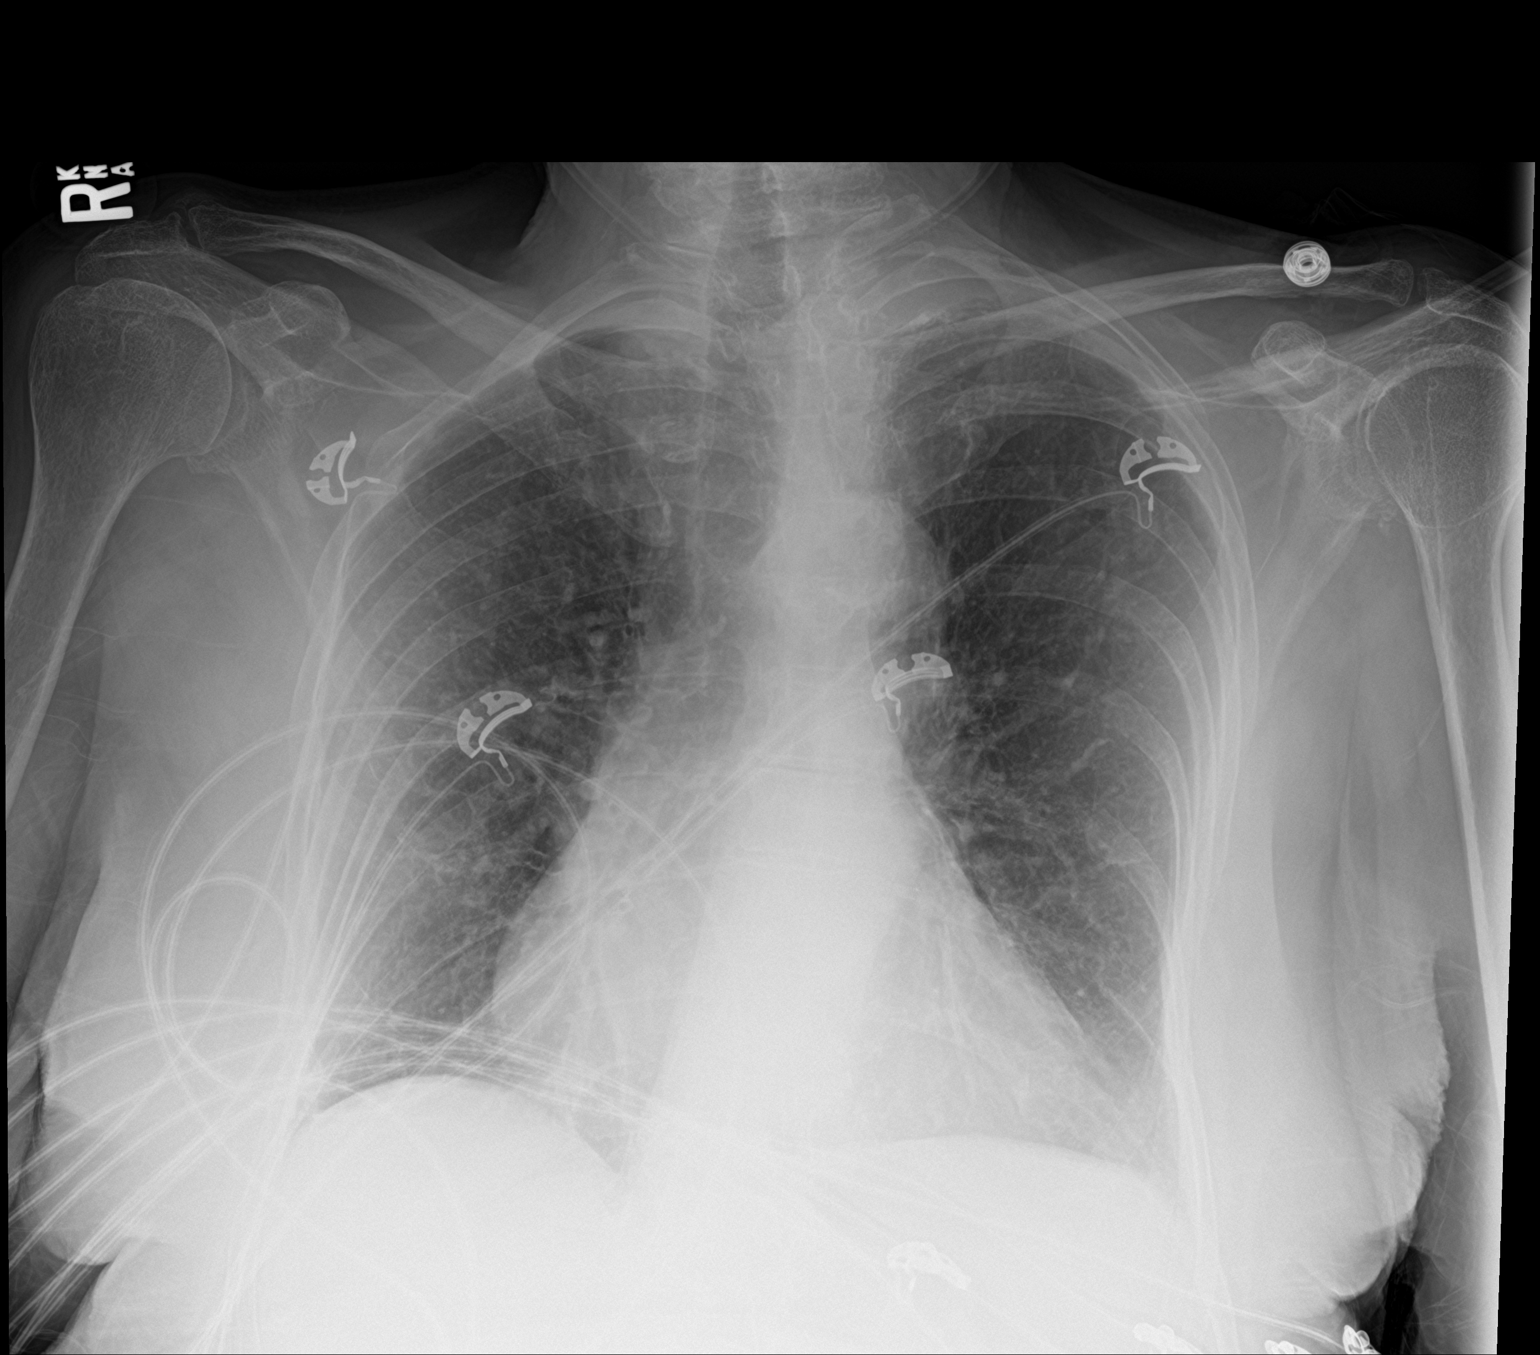

[2 of 2 positions shown; findings below may reference images not displayed]

FINDINGS: The heart size and mediastinal contours are within normal limits.
Aortic atherosclerosis. Resolution of bilateral pleural effusions.
Both lungs are clear. The visualized skeletal structures are
unremarkable.
IMPRESSION: No active cardiopulmonary disease. Interval resolution of bilateral
pleural effusions.

Aortic Atherosclerosis ([id number]).

## 2019-12-01 IMAGING — US US ABDOMEN LIMITED
1 series · 14 of 25 positions shown · non-contrast
Comparison: CT Abdomen and Pelvis without contrast 11/04/2017

CLINICAL DATA: [AGE] female with abnormal LFTs.

EXAM:
ULTRASOUND ABDOMEN LIMITED RIGHT UPPER QUADRANT

[Series 1: us abdomen limited · 0.11mm/px · 14 of 25 slices shown]
[im 1/25]
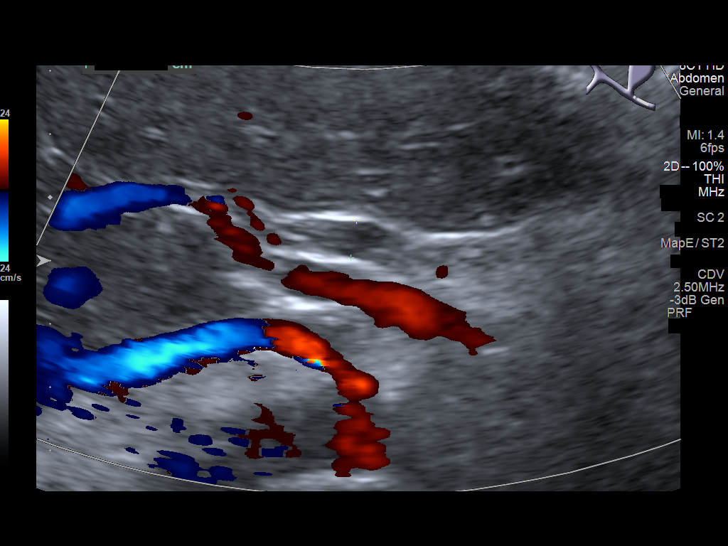
[im 3/25]
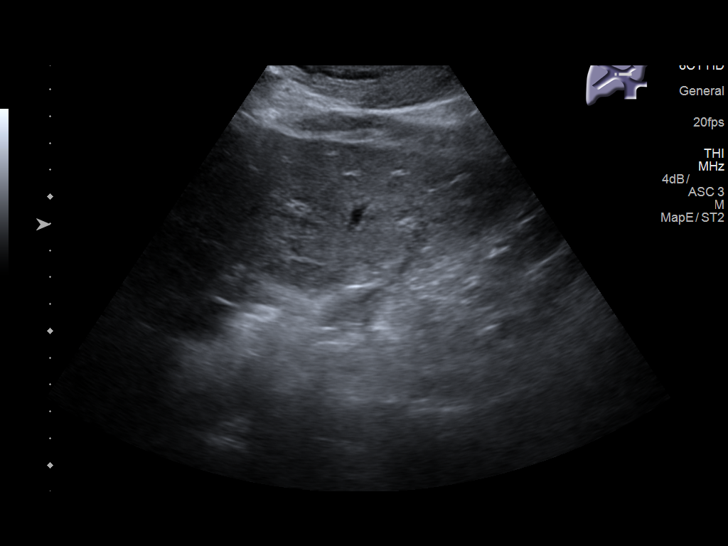
[im 5/25]
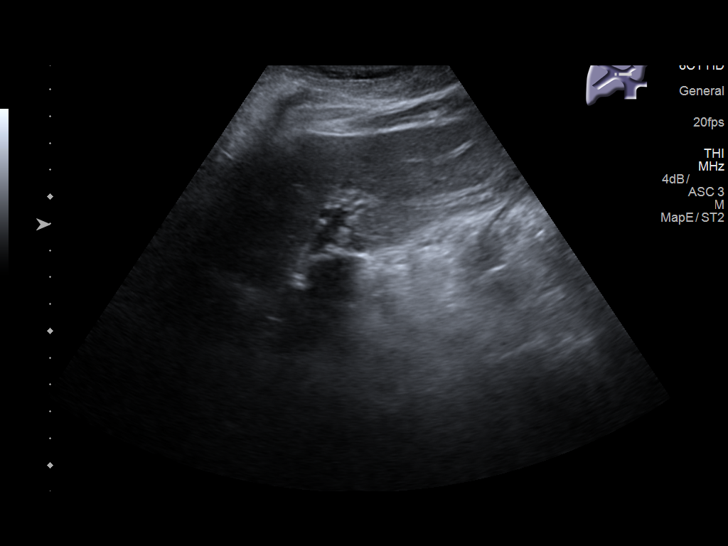
[im 7/25]
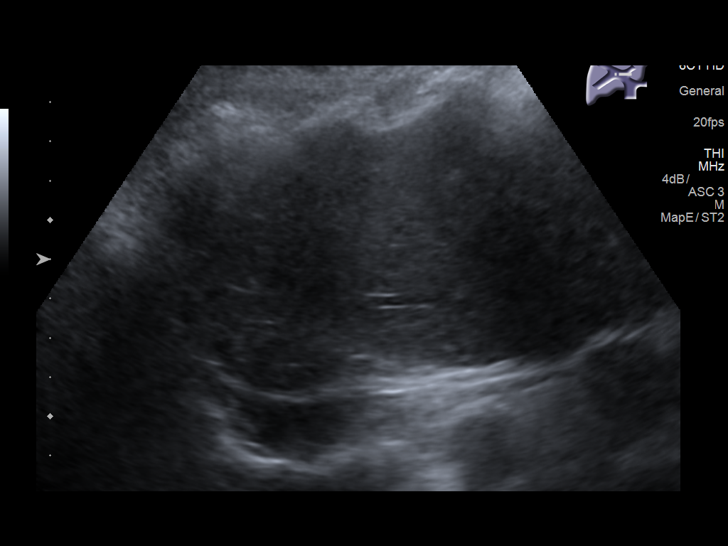
[im 9/25]
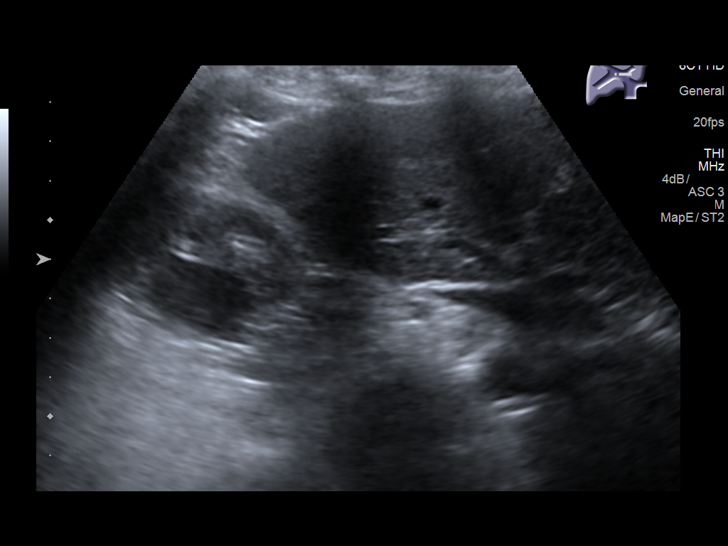
[im 10/25]
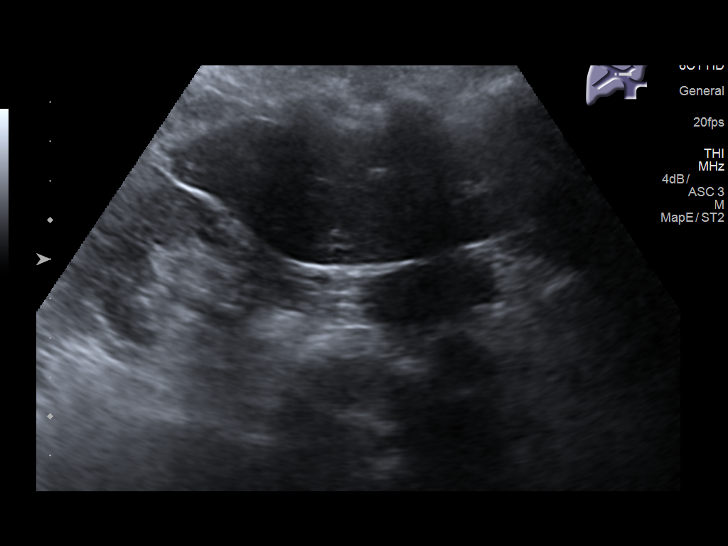
[im 12/25]
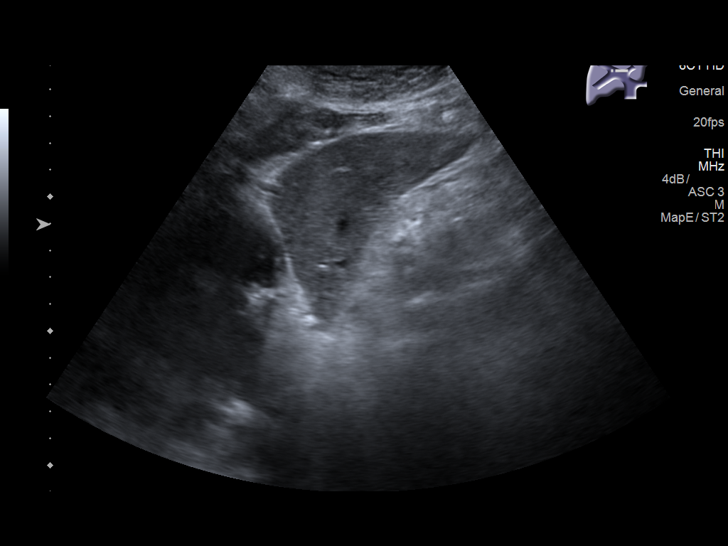
[im 14/25]
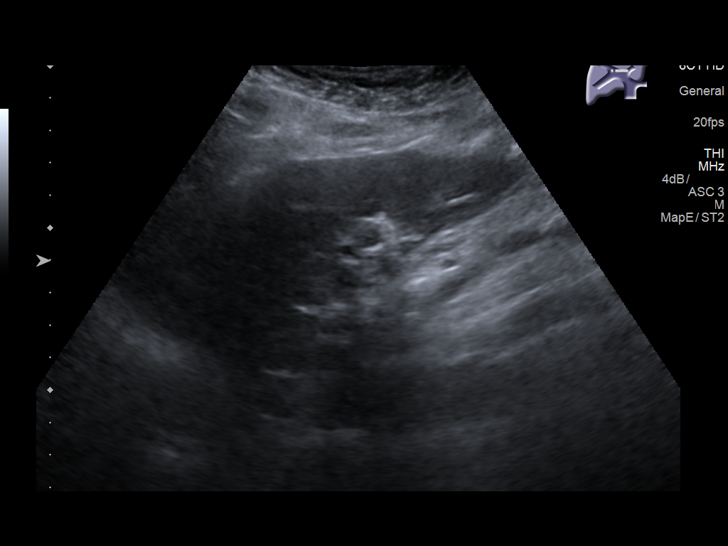
[im 16/25]
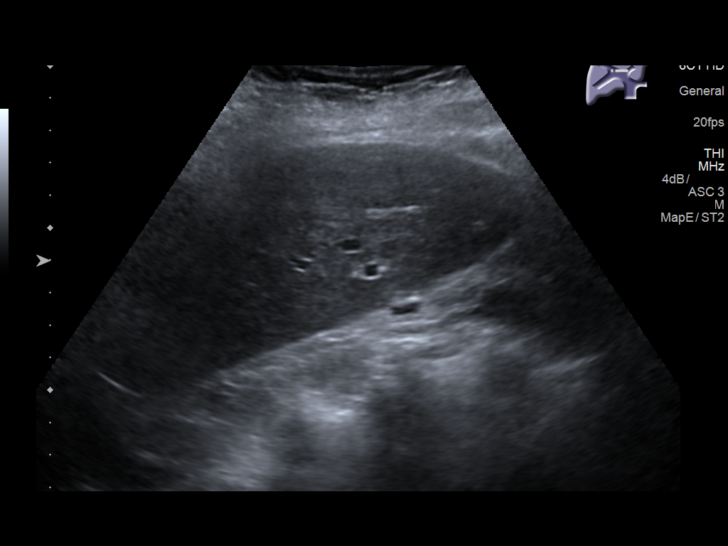
[im 17/25]
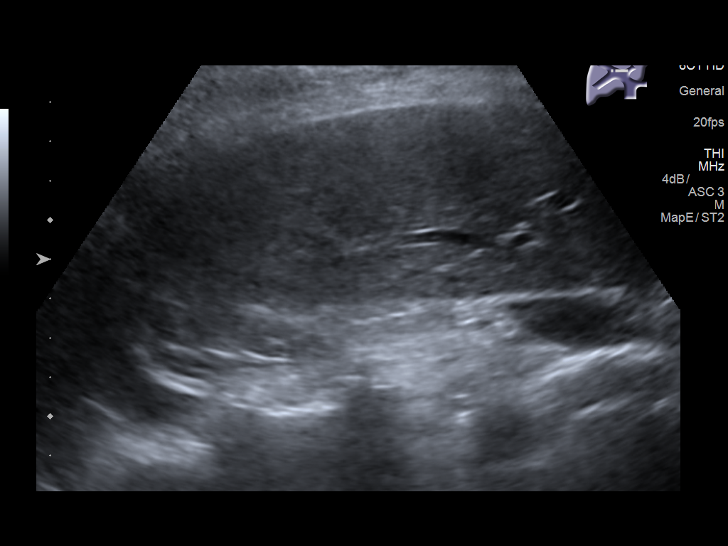
[im 19/25]
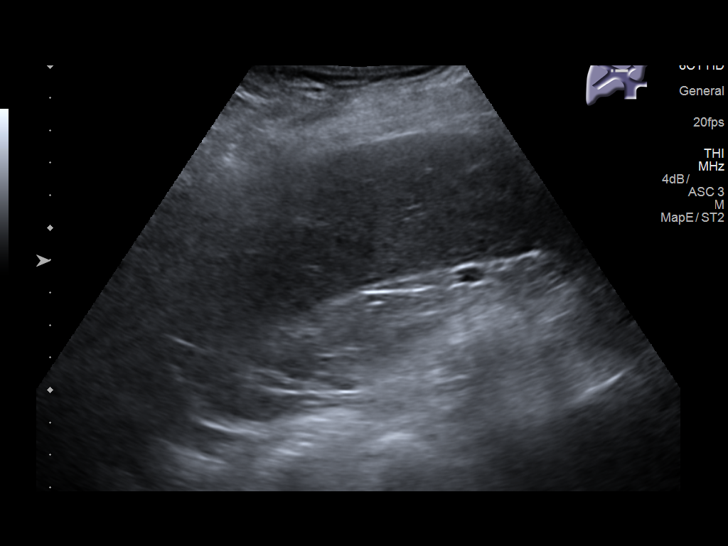
[im 21/25]
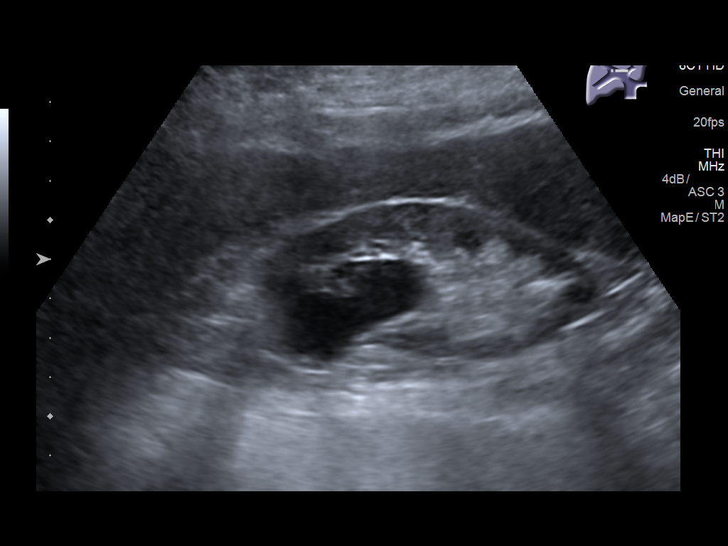
[im 23/25]
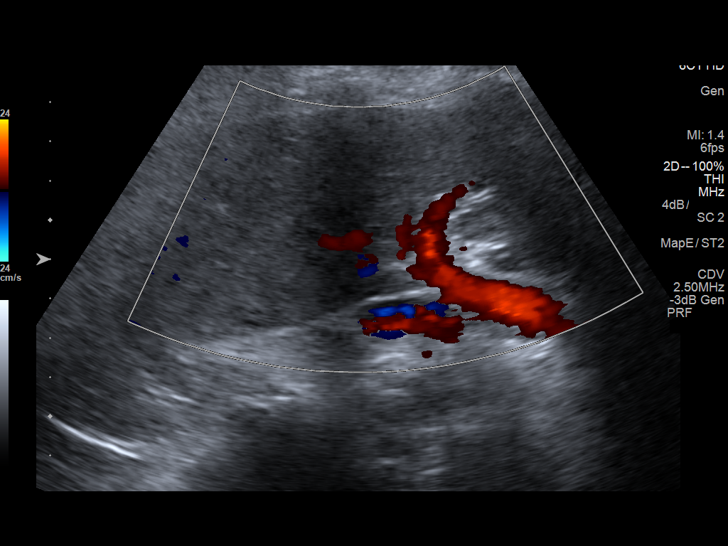
[im 25/25]
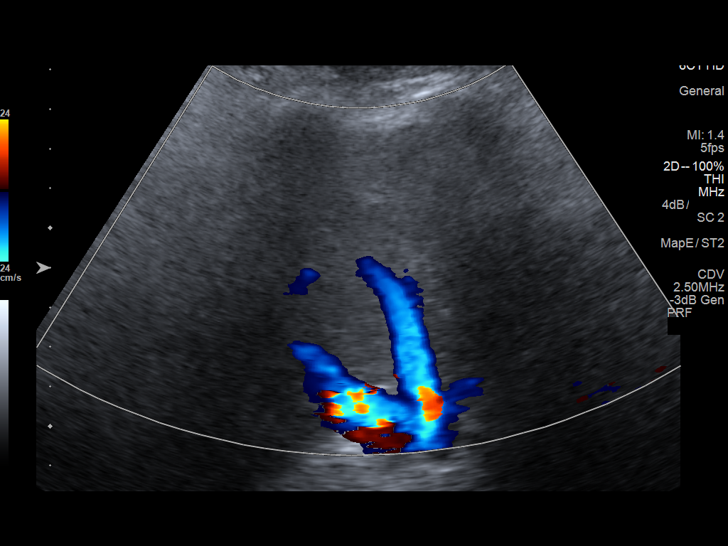

[14 of 25 positions shown; findings below may reference images not displayed]

FINDINGS: Gallbladder:

Surgically absent.

Common bile duct:

Diameter: 6 millimeters, normal.

Liver:

Liver echogenicity within normal limits. No intrahepatic biliary
ductal dilatation or discrete liver lesion. Portal vein is patent on
color Doppler imaging with normal direction of blood flow towards
the liver (image 25).

Other findings: Stable visible right kidney including simple
appearing right upper pole cyst.
IMPRESSION: Surgically absent gallbladder with normal ultrasound appearance of
the liver and no evidence of biliary obstruction.

## 2019-12-28 NOTE — Progress Notes (Signed)
Cardiology Office Note:    Date:  12/29/2019   ID:  Sandra Cockayne, DOB 05-05-1924, MRN [id number]  PCP:  Levin Erp, MD  Cardiologist:  Sinclair Grooms, MD   Referring MD: Levin Erp, MD   Chief Complaint  Patient presents with  . Coronary Artery Disease  . Hypertension  . Atrial Fibrillation    History of Present Illness:    ARIYONA EID is a 86 y.o. female with a hx of essential hypertension,chronic diastolic HF,hypothyroidism, chronic atrial fibrillation (felt too high risk for [facility]), CKD, stroke (2010) and recently diagnosed CAD w/ STEMI s/p DES x 2 to LAD in 2017, Initial EF 25% improved to greater than 50%.  She continues to do well.  Her daughter is staying with her at night because of macular degeneration and concerns that she may fall and be unable to get up.  She does 5 laps within her house every day.  These are associated with some shortness of breath but she recovers quickly.  She denies chest discomfort.  She has had some lower left leg edema although it resolves by the next morning.  She denies orthopnea and PND.  She denies palpitations, transient neurological symptoms, and blood in her urine or stool.  Past Medical History:  Diagnosis Date  . CAD (coronary artery disease)    a. 07/2016: anterior STEMI s/p DES x2 to LAD  . CKD (chronic kidney disease), stage III   . CVA (cerebral infarction) 2010  . Dyspnea   . Fall    a. 2014: nasal bone fx.  . Hypertension   . Hypothyroidism   . Ischemic cardiomyopathy    a. EF 25% by cath 08/19/16 then echo showed EF 50-55% 08/20/16.  . Mitral regurgitation    a. mild by echo 07/2016.  . Mitral stenosis    a. mild by echo 07/2016.  Marland Kitchen Permanent atrial fibrillation ([place])    a. not presently on anticoagulation due to STEMI 07/2016 and risk of triple therapy with DAPT + anticoag.  . Tricuspid regurgitation    a. mod by echo 07/2016.    Past Surgical History:  Procedure Laterality Date  . CARDIAC  CATHETERIZATION N/A 08/19/2016   Procedure: Left Heart Cath and Coronary Angiography;  Surgeon: Belva Crome, MD;  LM 30%, pLAD 50%, dLAD 100% s/p  Synergy 2.5 x 24 mm DES, RCA 20%  . CARDIAC CATHETERIZATION N/A 08/19/2016   Procedure: Coronary Stent Intervention;  Surgeon: Belva Crome, MD;  SYNERGY DES 2.5X24 drug eluting stent  . CHOLECYSTECTOMY      Current Medications: Current Meds  Medication Sig  . acetaminophen (TYLENOL) 500 MG tablet Take 500 mg by mouth at bedtime as needed for mild pain.   Marland Kitchen apixaban (ELIQUIS) 2.5 MG TABS tablet Take 1 tablet (2.5 mg total) by mouth 2 (two) times daily.  . carvedilol (COREG) 3.125 MG tablet Take 1 tablet (3.125 mg total) by mouth 2 (two) times daily with a meal.  . furosemide (LASIX) 20 MG tablet Take 1 tablet (20 mg total) by mouth daily.  Marland Kitchen levothyroxine (SYNTHROID, LEVOTHROID) 88 MCG tablet Take 88 mcg by mouth See admin instructions. Take 1 tablet (88 mcg) by mouth Monday thru Friday mornings (skip Saturday and Sunday)  . loteprednol (ALREX) 0.2 % SUSP Place 1 drop into both eyes 2 (two) times daily.  . nitroGLYCERIN (NITROSTAT) 0.4 MG SL tablet Place 0.4 mg under the tongue every 5 (five) minutes as needed for chest pain.   Marland Kitchen  spironolactone (ALDACTONE) 25 MG tablet TAKE 1/2 A TABLET BY MOUTH DAILY.  . valsartan (DIOVAN) 40 MG tablet Take 1 tablet (40 mg total) by mouth daily.     Allergies:   Patient has no known allergies.   Social History   Socioeconomic History  . Marital status: Widowed    Spouse name: Not on file  . Number of children: Not on file  . Years of education: Not on file  . Highest education level: Not on file  Occupational History  . Not on file  Tobacco Use  . Smoking status: Never Smoker  . Smokeless tobacco: Never Used  Substance and Sexual Activity  . Alcohol use: No  . Drug use: No  . Sexual activity: Not Currently  Other Topics Concern  . Not on file  Social History Narrative   Lives independently    Social Determinants of Health   Financial Resource Strain:   . Difficulty of Paying Living Expenses: Not on file  Food Insecurity:   . Worried About Charity fundraiser in the Last Year: Not on file  . Ran Out of Food in the Last Year: Not on file  Transportation Needs:   . Lack of Transportation (Medical): Not on file  . Lack of Transportation (Non-Medical): Not on file  Physical Activity:   . Days of Exercise per Week: Not on file  . Minutes of Exercise per Session: Not on file  Stress:   . Feeling of Stress : Not on file  Social Connections:   . Frequency of Communication with Friends and Family: Not on file  . Frequency of Social Gatherings with Friends and Family: Not on file  . Attends Religious Services: Not on file  . Active Member of Clubs or Organizations: Not on file  . Attends Archivist Meetings: Not on file  . Marital Status: Not on file     Family History: The patient's family history includes Diabetes Mellitus II in her son.  ROS:   Please see the history of present illness.    Does not sleep as well as she would like.  She uses a walker.  Her appetite is been poor.  She does drink Ensure.  All other systems reviewed and are negative.  EKGs/Labs/Other Studies Reviewed:    The following studies were reviewed today: No new data  EKG:  EKG atrial fibrillation with controlled rate.  Poor R wave progression V1 through V4.  Isolated PVCs.  Recent Labs: No results found for requested labs within last 8760 hours.  Recent Lipid Panel    Component Value Date/Time   CHOL 187 08/19/2016 0328   TRIG 52 08/19/2016 0328   HDL 64 08/19/2016 0328   CHOLHDL 2.9 08/19/2016 0328   VLDL 10 08/19/2016 0328   LDLCALC 113 (H) 08/19/2016 0328    Physical Exam:    VS:  BP (!) 154/74   Pulse 70   Ht '5\' 7"'$  (1.702 m)   Wt 133 lb 1.9 oz (60.4 kg)   SpO2 97%   BMI 20.85 kg/m     Wt Readings from Last 3 Encounters:  12/29/19 133 lb 1.9 oz (60.4 kg)   06/03/19 137 lb (62.1 kg)  10/01/18 185 lb (83.9 kg)     GEN: Elderly and age-appropriate in appearance..  Has lost significant weight over the past 2-1/2 years.  No acute distress HEENT: Normal NECK: No JVD. LYMPHATICS: No lymphadenopathy CARDIAC: Irregularly irregular RR without murmur, gallop, or edema. VASCULAR:  Normal Pulses. No bruits. RESPIRATORY:  Clear to auscultation without rales, wheezing or rhonchi  ABDOMEN: Soft, non-tender, non-distended, No pulsatile mass, MUSCULOSKELETAL: No deformity  SKIN: Warm and dry NEUROLOGIC:  Alert and oriented x 3 PSYCHIATRIC:  Normal affect   ASSESSMENT:    1. Chronic combined systolic and diastolic heart failure ([place])   2. Coronary artery disease of native artery with stable angina pectoris, unspecified whether native or transplanted heart ([place])   3. Chronic atrial fibrillation (HCC)   4. Stage 3b chronic kidney disease   5. Essential hypertension   6. Ischemic cardiomyopathy   7. Educated about COVID-19 virus infection    PLAN:    In order of problems listed above:  1. No clinical features of volume overload. 2. Denies angina.  Secondary prevention discussed briefly.  Encouraged to walk.  3. Stable rhythm with A. fib and controlled rate.   4. Creatinine was 1.52 in January.  She is on age and kidney function appropriate Eliquis dose. 5. Blood pressure is well controlled 6. Not discussed 7. She has received 1 dose of the COVID-19 vaccine and the next dose is pending.  I encouraged continued physical activity.  Continue the current dose of Eliquis, furosemide, Coreg, Diovan, and Aldactone.  80-monthfollow-up.  Be met in CBC at that time.     Medication Adjustments/Labs and Tests Ordered: Current medicines are reviewed at length with the patient today.  Concerns regarding medicines are outlined above.  Orders Placed This Encounter  Procedures  . EKG 12-Lead   No orders of the defined types were placed in this  encounter.   Patient Instructions  Medication Instructions:  Your physician recommends that you continue on your current medications as directed. Please refer to the Current Medication list given to you today.  *If you need a refill on your cardiac medications before your next appointment, please call your pharmacy*   Lab Work: None If you have labs (blood work) drawn today and your tests are completely normal, you will receive your results only by: .Marland KitchenMyChart Message (if you have MyChart) OR . A paper copy in the mail If you have any lab test that is abnormal or we need to change your treatment, we will call you to review the results.   Testing/Procedures: None   Follow-Up: [facility] and [facility] needs are our [facility].  As part of our continuing mission to provide you with exceptional heart care, we have created designated Provider Care Teams.  These Care Teams include your primary Cardiologist (physician) and Advanced Practice Providers (APPs -  Physician Assistants and Nurse Practitioners) who all work together to provide you with the care you need, when you need it.  We recommend signing up for the patient portal called "MyChart".  Sign up information is provided on this After Visit Summary.  MyChart is used to connect with patients for Virtual Visits (Telemedicine).  Patients are able to view lab/test results, encounter notes, upcoming appointments, etc.  Non-urgent messages can be sent to your provider as well.   To learn more about what you can do with MyChart, go to [email or web address]    Your next appointment:   7-9 month(s)  The format for your next appointment:   In Person  Provider:   You may see HSinclair Grooms MD or one of the following Advanced Practice Providers on your designated Care Team:    LTruitt Merle NP  LCecilie Kicks NP  JKathyrn Drown NP  Other Instructions      Signed, Sinclair Grooms, MD  12/29/2019 3:13 PM     [facility]

## 2019-12-29 ENCOUNTER — Other Ambulatory Visit: Payer: Self-pay

## 2019-12-29 ENCOUNTER — Ambulatory Visit: Payer: Medicare PPO | Admitting: Interventional Cardiology

## 2019-12-29 ENCOUNTER — Encounter: Payer: Self-pay | Admitting: Interventional Cardiology

## 2019-12-29 VITALS — BP 154/74 | HR 70 | Ht 67.0 in | Wt 133.1 lb

## 2019-12-29 DIAGNOSIS — I255 Ischemic cardiomyopathy: Secondary | ICD-10-CM

## 2019-12-29 DIAGNOSIS — I25118 Atherosclerotic heart disease of native coronary artery with other forms of angina pectoris: Secondary | ICD-10-CM | POA: Diagnosis not present

## 2019-12-29 DIAGNOSIS — N1832 Chronic kidney disease, stage 3b: Secondary | ICD-10-CM

## 2019-12-29 DIAGNOSIS — I10 Essential (primary) hypertension: Secondary | ICD-10-CM

## 2019-12-29 DIAGNOSIS — Z7189 Other specified counseling: Secondary | ICD-10-CM

## 2019-12-29 DIAGNOSIS — I482 Chronic atrial fibrillation, unspecified: Secondary | ICD-10-CM | POA: Diagnosis not present

## 2019-12-29 DIAGNOSIS — I5042 Chronic combined systolic (congestive) and diastolic (congestive) heart failure: Secondary | ICD-10-CM | POA: Diagnosis not present

## 2019-12-29 NOTE — Patient Instructions (Signed)
Medication Instructions:  Your physician recommends that you continue on your current medications as directed. Please refer to the Current Medication list given to you today.  *If you need a refill on your cardiac medications before your next appointment, please call your pharmacy*   Lab Work: None If you have labs (blood work) drawn today and your tests are completely normal, you will receive your results only by: Marland Kitchen MyChart Message (if you have MyChart) OR . A paper copy in the mail If you have any lab test that is abnormal or we need to change your treatment, we will call you to review the results.   Testing/Procedures: None   Follow-Up: At [facility], you and your health needs are our priority.  As part of our continuing mission to provide you with exceptional heart care, we have created designated Provider Care Teams.  These Care Teams include your primary Cardiologist (physician) and Advanced Practice Providers (APPs -  Physician Assistants and Nurse Practitioners) who all work together to provide you with the care you need, when you need it.  We recommend signing up for the patient portal called "MyChart".  Sign up information is provided on this After Visit Summary.  MyChart is used to connect with patients for Virtual Visits (Telemedicine).  Patients are able to view lab/test results, encounter notes, upcoming appointments, etc.  Non-urgent messages can be sent to your provider as well.   To learn more about what you can do with MyChart, go to [email or web address].    Your next appointment:   7-9 month(s)  The format for your next appointment:   In Person  Provider:   You may see Lesleigh Noe, MD or one of the following Advanced Practice Providers on your designated Care Team:    Norma Fredrickson, NP  Nada Boozer, NP  Georgie Chard, NP    Other Instructions

## 2020-02-11 DIAGNOSIS — I4891 Unspecified atrial fibrillation: Secondary | ICD-10-CM | POA: Diagnosis not present

## 2020-02-11 DIAGNOSIS — D6869 Other thrombophilia: Secondary | ICD-10-CM | POA: Diagnosis not present

## 2020-02-11 DIAGNOSIS — Z823 Family history of stroke: Secondary | ICD-10-CM | POA: Diagnosis not present

## 2020-02-11 DIAGNOSIS — H3530 Unspecified macular degeneration: Secondary | ICD-10-CM | POA: Diagnosis not present

## 2020-02-11 DIAGNOSIS — I739 Peripheral vascular disease, unspecified: Secondary | ICD-10-CM | POA: Diagnosis not present

## 2020-02-11 DIAGNOSIS — I10 Essential (primary) hypertension: Secondary | ICD-10-CM | POA: Diagnosis not present

## 2020-02-11 DIAGNOSIS — I2510 Atherosclerotic heart disease of native coronary artery without angina pectoris: Secondary | ICD-10-CM | POA: Diagnosis not present

## 2020-02-11 DIAGNOSIS — E039 Hypothyroidism, unspecified: Secondary | ICD-10-CM | POA: Diagnosis not present

## 2020-02-11 DIAGNOSIS — Z7901 Long term (current) use of anticoagulants: Secondary | ICD-10-CM | POA: Diagnosis not present

## 2020-04-01 DIAGNOSIS — I4891 Unspecified atrial fibrillation: Secondary | ICD-10-CM | POA: Diagnosis not present

## 2020-04-01 DIAGNOSIS — M79606 Pain in leg, unspecified: Secondary | ICD-10-CM | POA: Diagnosis not present

## 2020-04-01 DIAGNOSIS — I5030 Unspecified diastolic (congestive) heart failure: Secondary | ICD-10-CM | POA: Diagnosis not present

## 2020-04-04 DIAGNOSIS — H26493 Other secondary cataract, bilateral: Secondary | ICD-10-CM | POA: Diagnosis not present

## 2020-04-04 DIAGNOSIS — H353131 Nonexudative age-related macular degeneration, bilateral, early dry stage: Secondary | ICD-10-CM | POA: Diagnosis not present

## 2020-04-04 DIAGNOSIS — H524 Presbyopia: Secondary | ICD-10-CM | POA: Diagnosis not present

## 2020-04-12 DIAGNOSIS — H26493 Other secondary cataract, bilateral: Secondary | ICD-10-CM | POA: Diagnosis not present

## 2020-04-12 DIAGNOSIS — H353131 Nonexudative age-related macular degeneration, bilateral, early dry stage: Secondary | ICD-10-CM | POA: Diagnosis not present

## 2020-04-12 DIAGNOSIS — H26492 Other secondary cataract, left eye: Secondary | ICD-10-CM | POA: Diagnosis not present

## 2020-05-02 DIAGNOSIS — I4891 Unspecified atrial fibrillation: Secondary | ICD-10-CM | POA: Diagnosis not present

## 2020-05-02 DIAGNOSIS — I255 Ischemic cardiomyopathy: Secondary | ICD-10-CM | POA: Diagnosis not present

## 2020-05-02 DIAGNOSIS — I700 Atherosclerosis of aorta: Secondary | ICD-10-CM | POA: Diagnosis not present

## 2020-05-02 DIAGNOSIS — I10 Essential (primary) hypertension: Secondary | ICD-10-CM | POA: Diagnosis not present

## 2020-05-02 DIAGNOSIS — Z6825 Body mass index (BMI) 25.0-25.9, adult: Secondary | ICD-10-CM | POA: Diagnosis not present

## 2020-05-02 DIAGNOSIS — D649 Anemia, unspecified: Secondary | ICD-10-CM | POA: Diagnosis not present

## 2020-05-02 DIAGNOSIS — I5030 Unspecified diastolic (congestive) heart failure: Secondary | ICD-10-CM | POA: Diagnosis not present

## 2020-05-02 DIAGNOSIS — E039 Hypothyroidism, unspecified: Secondary | ICD-10-CM | POA: Diagnosis not present

## 2020-05-02 DIAGNOSIS — B029 Zoster without complications: Secondary | ICD-10-CM | POA: Diagnosis not present

## 2020-05-03 DIAGNOSIS — H26491 Other secondary cataract, right eye: Secondary | ICD-10-CM | POA: Diagnosis not present

## 2020-05-03 DIAGNOSIS — H35363 Drusen (degenerative) of macula, bilateral: Secondary | ICD-10-CM | POA: Diagnosis not present

## 2020-05-12 DIAGNOSIS — N390 Urinary tract infection, site not specified: Secondary | ICD-10-CM | POA: Diagnosis not present

## 2020-05-12 DIAGNOSIS — M79605 Pain in left leg: Secondary | ICD-10-CM | POA: Diagnosis not present

## 2020-05-24 ENCOUNTER — Other Ambulatory Visit: Payer: Self-pay | Admitting: Cardiology

## 2020-05-24 NOTE — Telephone Encounter (Addendum)
Prescription refill request for Eliquis received.  Last office visit: 12/29/2019, Katrinka Blazing Scr: 1.3 05/02/2020 Age: 86 y.o. Weight: 60.4 kg   Weight close to cut off. Discussed with Pharm D, Megan. Will continue on current dose Eliquis 2.5mg  BID.

## 2020-06-29 DIAGNOSIS — Z20828 Contact with and (suspected) exposure to other viral communicable diseases: Secondary | ICD-10-CM | POA: Diagnosis not present

## 2020-06-29 DIAGNOSIS — J019 Acute sinusitis, unspecified: Secondary | ICD-10-CM | POA: Diagnosis not present

## 2020-07-06 DIAGNOSIS — J329 Chronic sinusitis, unspecified: Secondary | ICD-10-CM | POA: Diagnosis not present

## 2020-07-06 DIAGNOSIS — Z6824 Body mass index (BMI) 24.0-24.9, adult: Secondary | ICD-10-CM | POA: Diagnosis not present

## 2020-07-06 DIAGNOSIS — E079 Disorder of thyroid, unspecified: Secondary | ICD-10-CM | POA: Diagnosis not present

## 2020-07-06 DIAGNOSIS — N189 Chronic kidney disease, unspecified: Secondary | ICD-10-CM | POA: Diagnosis not present

## 2020-07-28 NOTE — Progress Notes (Signed)
Cardiology Office Note:    Date:  07/29/2020   ID:  Sandra Cockayne, DOB 1924/08/31, MRN [id number]  PCP:  Levin Erp, MD  Cardiologist:  Sinclair Grooms, MD   Referring MD: Levin Erp, MD   Chief Complaint  Patient presents with  . Congestive Heart Failure  . Coronary Artery Disease    History of Present Illness:    Chelsea Fuller is a 86 y.o. female with a hx of essential hypertension,chronic diastolic HF,hypothyroidism, chronic atrial fibrillation (felt too high risk for [facility]), CKD, stroke (2010) and recently diagnosed CAD w/ STEMI s/p DES x 2 to LAD in 2017, Initial EF 25% improved to greater than 50%.  She is doing well.  She denies angina.  She has no heart failure symptoms.  There is no lower extremity swelling.  Denies palpitations and syncope.  Medications are being well-tolerated.  Past Medical History:  Diagnosis Date  . CAD (coronary artery disease)    a. 07/2016: anterior STEMI s/p DES x2 to LAD  . CKD (chronic kidney disease), stage III ([place])   . CVA (cerebral infarction) 2010  . Dyspnea   . Fall    a. 2014: nasal bone fx.  . Hypertension   . Hypothyroidism   . Ischemic cardiomyopathy    a. EF 25% by cath 08/19/16 then echo showed EF 50-55% 08/20/16.  . Mitral regurgitation    a. mild by echo 07/2016.  . Mitral stenosis    a. mild by echo 07/2016.  Marland Kitchen Permanent atrial fibrillation ([place])    a. not presently on anticoagulation due to STEMI 07/2016 and risk of triple therapy with DAPT + anticoag.  . Tricuspid regurgitation    a. mod by echo 07/2016.    Past Surgical History:  Procedure Laterality Date  . CARDIAC CATHETERIZATION N/A 08/19/2016   Procedure: Left Heart Cath and Coronary Angiography;  Surgeon: Belva Crome, MD;  LM 30%, pLAD 50%, dLAD 100% s/p  Synergy 2.5 x 24 mm DES, RCA 20%  . CARDIAC CATHETERIZATION N/A 08/19/2016   Procedure: Coronary Stent Intervention;  Surgeon: Belva Crome, MD;  SYNERGY DES 2.5X24 drug eluting stent  .  CHOLECYSTECTOMY      Current Medications: Current Meds  Medication Sig  . acetaminophen (TYLENOL) 500 MG tablet Take 500 mg by mouth at bedtime as needed for mild pain.   . carvedilol (COREG) 3.125 MG tablet Take 1 tablet (3.125 mg total) by mouth 2 (two) times daily with a meal.  . ELIQUIS 2.5 MG TABS tablet TAKE 1 TABLET BY MOUTH TWICE A DAY  . furosemide (LASIX) 20 MG tablet Take 1 tablet (20 mg total) by mouth daily.  Marland Kitchen levothyroxine (SYNTHROID) 75 MCG tablet Take 75 mcg by mouth daily before breakfast.  . loteprednol (ALREX) 0.2 % SUSP Place 1 drop into both eyes 2 (two) times daily.  . nitroGLYCERIN (NITROSTAT) 0.4 MG SL tablet Place 0.4 mg under the tongue every 5 (five) minutes as needed for chest pain.   Marland Kitchen spironolactone (ALDACTONE) 25 MG tablet TAKE 1/2 A TABLET BY MOUTH DAILY.  . valsartan (DIOVAN) 40 MG tablet Take 1 tablet (40 mg total) by mouth daily.     Allergies:   Patient has no known allergies.   Social History   Socioeconomic History  . Marital status: Widowed    Spouse name: Not on file  . Number of children: Not on file  . Years of education: Not on file  . Highest education level:  Not on file  Occupational History  . Not on file  Tobacco Use  . Smoking status: Never Smoker  . Smokeless tobacco: Never Used  Vaping Use  . Vaping Use: Never used  Substance and Sexual Activity  . Alcohol use: No  . Drug use: No  . Sexual activity: Not Currently  Other Topics Concern  . Not on file  Social History Narrative   Lives independently   Social Determinants of Health   Financial Resource Strain:   . Difficulty of Paying Living Expenses: Not on file  Food Insecurity:   . Worried About Programme researcher, broadcasting/film/video in the Last Year: Not on file  . Ran Out of Food in the Last Year: Not on file  Transportation Needs:   . Lack of Transportation (Medical): Not on file  . Lack of Transportation (Non-Medical): Not on file  Physical Activity:   . Days of Exercise per  Week: Not on file  . Minutes of Exercise per Session: Not on file  Stress:   . Feeling of Stress : Not on file  Social Connections:   . Frequency of Communication with Friends and Family: Not on file  . Frequency of Social Gatherings with Friends and Family: Not on file  . Attends Religious Services: Not on file  . Active Member of Clubs or Organizations: Not on file  . Attends Banker Meetings: Not on file  . Marital Status: Not on file     Family History: The patient's family history includes Diabetes Mellitus II in her son.  ROS:   Please see the history of present illness.    Recent fall with foot injury.  Appetite is not as good as she would like.  The foods she wants to eat are not appropriate for her diet.  All other systems reviewed and are negative.  EKGs/Labs/Other Studies Reviewed:    The following studies were reviewed today: No new imaging data  EKG:  EKG not repeated  Recent Labs: No results found for requested labs within last 8760 hours.  Recent Lipid Panel    Component Value Date/Time   CHOL 187 08/19/2016 0328   TRIG 52 08/19/2016 0328   HDL 64 08/19/2016 0328   CHOLHDL 2.9 08/19/2016 0328   VLDL 10 08/19/2016 0328   LDLCALC 113 (H) 08/19/2016 0328    Physical Exam:    VS:  BP (!) 148/60   Pulse (!) 56   Ht 5\' 7"  (1.702 m)   Wt 132 lb 6.4 oz (60.1 kg)   SpO2 97%   BMI 20.74 kg/m     Wt Readings from Last 3 Encounters:  07/29/20 132 lb 6.4 oz (60.1 kg)  12/29/19 133 lb 1.9 oz (60.4 kg)  06/03/19 137 lb (62.1 kg)     GEN: Elderly and compatible with age. No acute distress HEENT: Normal NECK: No JVD. LYMPHATICS: No lymphadenopathy CARDIAC: Irregularly irregular RR without murmur, gallop, or edema. VASCULAR:  Normal Pulses. No bruits. RESPIRATORY:  Clear to auscultation without rales, wheezing or rhonchi  ABDOMEN: Soft, non-tender, non-distended, No pulsatile mass, MUSCULOSKELETAL: The left foot has erythema over the region of  the great toe and there is an abrasion.  She had a fall that injured this area. SKIN: Warm and dry NEUROLOGIC:  Alert and oriented x 3 PSYCHIATRIC:  Normal affect   ASSESSMENT:    1. Chronic combined systolic and diastolic heart failure (HCC)   2. Coronary artery disease of native artery with stable angina  pectoris, unspecified whether native or transplanted heart ([place])   3. Chronic atrial fibrillation (HCC)   4. Stage 3b chronic kidney disease ([place])   5. Essential hypertension   6. Educated about COVID-19 virus infection   7. Chronic anticoagulation    PLAN:    In order of problems listed above:  1. Heart failure is resolved preserved systolic function.  No volume overload. 2. Continue secondary prevention. 3. Based upon auscultation, she is in rate controlled atrial fibrillation. 4. Most recent creatinine was 1.47 compatible with stage IIIb CKD.  Potassium 5.0.  We need to keep an eye on her potassium levels.  Be met will be done on return. 5. Blood pressure control is excellent for her age.  Continue Aldactone, Diovan, furosemide, and carvedilol as prescribed. 6. Vaccinated and practicing social distancing. 7. Continue to monitor for complications of Eliquis therapy.  Continue current dose at 2.5 mg twice per day given kidney impairment.  Basic metabolic panel on return   Medication Adjustments/Labs and Tests Ordered: Current medicines are reviewed at length with the patient today.  Concerns regarding medicines are outlined above.  No orders of the defined types were placed in this encounter.  No orders of the defined types were placed in this encounter.   Patient Instructions  Medication Instructions:  Your physician recommends that you continue on your current medications as directed. Please refer to the Current Medication list given to you today.  *If you need a refill on your cardiac medications before your next appointment, please call your pharmacy*   Lab  Work: None If you have labs (blood work) drawn today and your tests are completely normal, you will receive your results only by: Marland Kitchen MyChart Message (if you have MyChart) OR . A paper copy in the mail If you have any lab test that is abnormal or we need to change your treatment, we will call you to review the results.   Testing/Procedures: None   Follow-Up: At [facility], you and your health needs are our priority.  As part of our continuing mission to provide you with exceptional heart care, we have created designated Provider Care Teams.  These Care Teams include your primary Cardiologist (physician) and Advanced Practice Providers (APPs -  Physician Assistants and Nurse Practitioners) who all work together to provide you with the care you need, when you need it.  We recommend signing up for the patient portal called "MyChart".  Sign up information is provided on this After Visit Summary.  MyChart is used to connect with patients for Virtual Visits (Telemedicine).  Patients are able to view lab/test results, encounter notes, upcoming appointments, etc.  Non-urgent messages can be sent to your provider as well.   To learn more about what you can do with MyChart, go to [email or web address].    Your next appointment:   6 month(s)  The format for your next appointment:   In Person  Provider:   You may see Sinclair Grooms, MD or one of the following Advanced Practice Providers on your designated Care Team:    Truitt Merle, NP  Cecilie Kicks, NP  Kathyrn Drown, NP    Other Instructions      Signed, Sinclair Grooms, MD  07/29/2020 3:46 PM    [place]

## 2020-07-29 ENCOUNTER — Encounter: Payer: Self-pay | Admitting: Interventional Cardiology

## 2020-07-29 ENCOUNTER — Other Ambulatory Visit: Payer: Self-pay

## 2020-07-29 ENCOUNTER — Ambulatory Visit: Payer: Medicare PPO | Admitting: Interventional Cardiology

## 2020-07-29 VITALS — BP 148/60 | HR 56 | Ht 67.0 in | Wt 132.4 lb

## 2020-07-29 DIAGNOSIS — Z7189 Other specified counseling: Secondary | ICD-10-CM | POA: Diagnosis not present

## 2020-07-29 DIAGNOSIS — I25118 Atherosclerotic heart disease of native coronary artery with other forms of angina pectoris: Secondary | ICD-10-CM

## 2020-07-29 DIAGNOSIS — Z7901 Long term (current) use of anticoagulants: Secondary | ICD-10-CM | POA: Diagnosis not present

## 2020-07-29 DIAGNOSIS — N1832 Chronic kidney disease, stage 3b: Secondary | ICD-10-CM

## 2020-07-29 DIAGNOSIS — I5042 Chronic combined systolic (congestive) and diastolic (congestive) heart failure: Secondary | ICD-10-CM | POA: Diagnosis not present

## 2020-07-29 DIAGNOSIS — I10 Essential (primary) hypertension: Secondary | ICD-10-CM | POA: Diagnosis not present

## 2020-07-29 DIAGNOSIS — I482 Chronic atrial fibrillation, unspecified: Secondary | ICD-10-CM | POA: Diagnosis not present

## 2020-07-29 NOTE — Patient Instructions (Signed)

## 2020-07-31 ENCOUNTER — Other Ambulatory Visit: Payer: Self-pay | Admitting: Cardiology

## 2020-08-10 DIAGNOSIS — I10 Essential (primary) hypertension: Secondary | ICD-10-CM | POA: Diagnosis not present

## 2020-08-10 DIAGNOSIS — E079 Disorder of thyroid, unspecified: Secondary | ICD-10-CM | POA: Diagnosis not present

## 2020-08-20 ENCOUNTER — Other Ambulatory Visit: Payer: Self-pay | Admitting: Cardiology

## 2020-11-04 ENCOUNTER — Other Ambulatory Visit: Payer: Self-pay | Admitting: Cardiology

## 2020-11-04 NOTE — Telephone Encounter (Signed)
Pt's age 87, wt 60.1 kg, SCr 1.46, CrCl 1.46, last ov w/ HS 07/29/20, "Continue current dose at 2.5 mg twice per day given kidney impairment."

## 2020-12-14 DIAGNOSIS — Z139 Encounter for screening, unspecified: Secondary | ICD-10-CM | POA: Diagnosis not present

## 2020-12-14 DIAGNOSIS — Z1331 Encounter for screening for depression: Secondary | ICD-10-CM | POA: Diagnosis not present

## 2020-12-14 DIAGNOSIS — Z0000 Encounter for general adult medical examination without abnormal findings: Secondary | ICD-10-CM | POA: Diagnosis not present

## 2020-12-14 DIAGNOSIS — Z9181 History of falling: Secondary | ICD-10-CM | POA: Diagnosis not present

## 2020-12-22 DIAGNOSIS — D649 Anemia, unspecified: Secondary | ICD-10-CM | POA: Diagnosis not present

## 2020-12-22 DIAGNOSIS — E039 Hypothyroidism, unspecified: Secondary | ICD-10-CM | POA: Diagnosis not present

## 2020-12-22 DIAGNOSIS — M1990 Unspecified osteoarthritis, unspecified site: Secondary | ICD-10-CM | POA: Diagnosis not present

## 2020-12-22 DIAGNOSIS — I252 Old myocardial infarction: Secondary | ICD-10-CM | POA: Diagnosis not present

## 2020-12-22 DIAGNOSIS — Z8673 Personal history of transient ischemic attack (TIA), and cerebral infarction without residual deficits: Secondary | ICD-10-CM | POA: Diagnosis not present

## 2020-12-22 DIAGNOSIS — Z6824 Body mass index (BMI) 24.0-24.9, adult: Secondary | ICD-10-CM | POA: Diagnosis not present

## 2020-12-22 DIAGNOSIS — Z87448 Personal history of other diseases of urinary system: Secondary | ICD-10-CM | POA: Diagnosis not present

## 2020-12-22 DIAGNOSIS — I119 Hypertensive heart disease without heart failure: Secondary | ICD-10-CM | POA: Diagnosis not present

## 2020-12-22 DIAGNOSIS — R269 Unspecified abnormalities of gait and mobility: Secondary | ICD-10-CM | POA: Diagnosis not present

## 2020-12-27 DIAGNOSIS — E079 Disorder of thyroid, unspecified: Secondary | ICD-10-CM | POA: Diagnosis not present

## 2020-12-27 DIAGNOSIS — I639 Cerebral infarction, unspecified: Secondary | ICD-10-CM | POA: Diagnosis not present

## 2020-12-27 DIAGNOSIS — I252 Old myocardial infarction: Secondary | ICD-10-CM | POA: Diagnosis not present

## 2020-12-27 DIAGNOSIS — R269 Unspecified abnormalities of gait and mobility: Secondary | ICD-10-CM | POA: Diagnosis not present

## 2020-12-27 DIAGNOSIS — I10 Essential (primary) hypertension: Secondary | ICD-10-CM | POA: Diagnosis not present

## 2020-12-27 DIAGNOSIS — Z6824 Body mass index (BMI) 24.0-24.9, adult: Secondary | ICD-10-CM | POA: Diagnosis not present

## 2020-12-28 DIAGNOSIS — D649 Anemia, unspecified: Secondary | ICD-10-CM | POA: Diagnosis not present

## 2020-12-28 DIAGNOSIS — I252 Old myocardial infarction: Secondary | ICD-10-CM | POA: Diagnosis not present

## 2020-12-28 DIAGNOSIS — Z6824 Body mass index (BMI) 24.0-24.9, adult: Secondary | ICD-10-CM | POA: Diagnosis not present

## 2020-12-28 DIAGNOSIS — M1990 Unspecified osteoarthritis, unspecified site: Secondary | ICD-10-CM | POA: Diagnosis not present

## 2020-12-28 DIAGNOSIS — Z87448 Personal history of other diseases of urinary system: Secondary | ICD-10-CM | POA: Diagnosis not present

## 2020-12-28 DIAGNOSIS — R269 Unspecified abnormalities of gait and mobility: Secondary | ICD-10-CM | POA: Diagnosis not present

## 2020-12-28 DIAGNOSIS — Z8673 Personal history of transient ischemic attack (TIA), and cerebral infarction without residual deficits: Secondary | ICD-10-CM | POA: Diagnosis not present

## 2020-12-28 DIAGNOSIS — I119 Hypertensive heart disease without heart failure: Secondary | ICD-10-CM | POA: Diagnosis not present

## 2020-12-28 DIAGNOSIS — E039 Hypothyroidism, unspecified: Secondary | ICD-10-CM | POA: Diagnosis not present

## 2020-12-30 DIAGNOSIS — Z6824 Body mass index (BMI) 24.0-24.9, adult: Secondary | ICD-10-CM | POA: Diagnosis not present

## 2020-12-30 DIAGNOSIS — E039 Hypothyroidism, unspecified: Secondary | ICD-10-CM | POA: Diagnosis not present

## 2020-12-30 DIAGNOSIS — Z87448 Personal history of other diseases of urinary system: Secondary | ICD-10-CM | POA: Diagnosis not present

## 2020-12-30 DIAGNOSIS — I119 Hypertensive heart disease without heart failure: Secondary | ICD-10-CM | POA: Diagnosis not present

## 2020-12-30 DIAGNOSIS — Z8673 Personal history of transient ischemic attack (TIA), and cerebral infarction without residual deficits: Secondary | ICD-10-CM | POA: Diagnosis not present

## 2020-12-30 DIAGNOSIS — M1990 Unspecified osteoarthritis, unspecified site: Secondary | ICD-10-CM | POA: Diagnosis not present

## 2020-12-30 DIAGNOSIS — D649 Anemia, unspecified: Secondary | ICD-10-CM | POA: Diagnosis not present

## 2020-12-30 DIAGNOSIS — R269 Unspecified abnormalities of gait and mobility: Secondary | ICD-10-CM | POA: Diagnosis not present

## 2020-12-30 DIAGNOSIS — I252 Old myocardial infarction: Secondary | ICD-10-CM | POA: Diagnosis not present

## 2021-01-03 DIAGNOSIS — I119 Hypertensive heart disease without heart failure: Secondary | ICD-10-CM | POA: Diagnosis not present

## 2021-01-03 DIAGNOSIS — M1990 Unspecified osteoarthritis, unspecified site: Secondary | ICD-10-CM | POA: Diagnosis not present

## 2021-01-03 DIAGNOSIS — E039 Hypothyroidism, unspecified: Secondary | ICD-10-CM | POA: Diagnosis not present

## 2021-01-03 DIAGNOSIS — I252 Old myocardial infarction: Secondary | ICD-10-CM | POA: Diagnosis not present

## 2021-01-03 DIAGNOSIS — Z8673 Personal history of transient ischemic attack (TIA), and cerebral infarction without residual deficits: Secondary | ICD-10-CM | POA: Diagnosis not present

## 2021-01-03 DIAGNOSIS — R269 Unspecified abnormalities of gait and mobility: Secondary | ICD-10-CM | POA: Diagnosis not present

## 2021-01-03 DIAGNOSIS — Z6824 Body mass index (BMI) 24.0-24.9, adult: Secondary | ICD-10-CM | POA: Diagnosis not present

## 2021-01-03 DIAGNOSIS — D649 Anemia, unspecified: Secondary | ICD-10-CM | POA: Diagnosis not present

## 2021-01-03 DIAGNOSIS — Z87448 Personal history of other diseases of urinary system: Secondary | ICD-10-CM | POA: Diagnosis not present

## 2021-01-05 DIAGNOSIS — Z8673 Personal history of transient ischemic attack (TIA), and cerebral infarction without residual deficits: Secondary | ICD-10-CM | POA: Diagnosis not present

## 2021-01-05 DIAGNOSIS — Z6824 Body mass index (BMI) 24.0-24.9, adult: Secondary | ICD-10-CM | POA: Diagnosis not present

## 2021-01-05 DIAGNOSIS — D649 Anemia, unspecified: Secondary | ICD-10-CM | POA: Diagnosis not present

## 2021-01-05 DIAGNOSIS — Z87448 Personal history of other diseases of urinary system: Secondary | ICD-10-CM | POA: Diagnosis not present

## 2021-01-05 DIAGNOSIS — M1990 Unspecified osteoarthritis, unspecified site: Secondary | ICD-10-CM | POA: Diagnosis not present

## 2021-01-05 DIAGNOSIS — I252 Old myocardial infarction: Secondary | ICD-10-CM | POA: Diagnosis not present

## 2021-01-05 DIAGNOSIS — E039 Hypothyroidism, unspecified: Secondary | ICD-10-CM | POA: Diagnosis not present

## 2021-01-05 DIAGNOSIS — I119 Hypertensive heart disease without heart failure: Secondary | ICD-10-CM | POA: Diagnosis not present

## 2021-01-05 DIAGNOSIS — R269 Unspecified abnormalities of gait and mobility: Secondary | ICD-10-CM | POA: Diagnosis not present

## 2021-01-10 DIAGNOSIS — Z8673 Personal history of transient ischemic attack (TIA), and cerebral infarction without residual deficits: Secondary | ICD-10-CM | POA: Diagnosis not present

## 2021-01-10 DIAGNOSIS — E039 Hypothyroidism, unspecified: Secondary | ICD-10-CM | POA: Diagnosis not present

## 2021-01-10 DIAGNOSIS — M1990 Unspecified osteoarthritis, unspecified site: Secondary | ICD-10-CM | POA: Diagnosis not present

## 2021-01-10 DIAGNOSIS — Z87448 Personal history of other diseases of urinary system: Secondary | ICD-10-CM | POA: Diagnosis not present

## 2021-01-10 DIAGNOSIS — R269 Unspecified abnormalities of gait and mobility: Secondary | ICD-10-CM | POA: Diagnosis not present

## 2021-01-10 DIAGNOSIS — D649 Anemia, unspecified: Secondary | ICD-10-CM | POA: Diagnosis not present

## 2021-01-10 DIAGNOSIS — Z6824 Body mass index (BMI) 24.0-24.9, adult: Secondary | ICD-10-CM | POA: Diagnosis not present

## 2021-01-10 DIAGNOSIS — I119 Hypertensive heart disease without heart failure: Secondary | ICD-10-CM | POA: Diagnosis not present

## 2021-01-10 DIAGNOSIS — I252 Old myocardial infarction: Secondary | ICD-10-CM | POA: Diagnosis not present

## 2021-01-12 DIAGNOSIS — I10 Essential (primary) hypertension: Secondary | ICD-10-CM | POA: Diagnosis not present

## 2021-01-17 DIAGNOSIS — J019 Acute sinusitis, unspecified: Secondary | ICD-10-CM | POA: Diagnosis not present

## 2021-01-17 DIAGNOSIS — J208 Acute bronchitis due to other specified organisms: Secondary | ICD-10-CM | POA: Diagnosis not present

## 2021-01-17 DIAGNOSIS — I10 Essential (primary) hypertension: Secondary | ICD-10-CM | POA: Diagnosis not present

## 2021-01-17 DIAGNOSIS — B9689 Other specified bacterial agents as the cause of diseases classified elsewhere: Secondary | ICD-10-CM | POA: Diagnosis not present

## 2021-01-20 DIAGNOSIS — Z87448 Personal history of other diseases of urinary system: Secondary | ICD-10-CM | POA: Diagnosis not present

## 2021-01-20 DIAGNOSIS — I119 Hypertensive heart disease without heart failure: Secondary | ICD-10-CM | POA: Diagnosis not present

## 2021-01-20 DIAGNOSIS — Z6824 Body mass index (BMI) 24.0-24.9, adult: Secondary | ICD-10-CM | POA: Diagnosis not present

## 2021-01-20 DIAGNOSIS — I252 Old myocardial infarction: Secondary | ICD-10-CM | POA: Diagnosis not present

## 2021-01-20 DIAGNOSIS — D649 Anemia, unspecified: Secondary | ICD-10-CM | POA: Diagnosis not present

## 2021-01-20 DIAGNOSIS — E039 Hypothyroidism, unspecified: Secondary | ICD-10-CM | POA: Diagnosis not present

## 2021-01-20 DIAGNOSIS — M1990 Unspecified osteoarthritis, unspecified site: Secondary | ICD-10-CM | POA: Diagnosis not present

## 2021-01-20 DIAGNOSIS — R269 Unspecified abnormalities of gait and mobility: Secondary | ICD-10-CM | POA: Diagnosis not present

## 2021-01-20 DIAGNOSIS — Z8673 Personal history of transient ischemic attack (TIA), and cerebral infarction without residual deficits: Secondary | ICD-10-CM | POA: Diagnosis not present

## 2021-01-21 DIAGNOSIS — Z8673 Personal history of transient ischemic attack (TIA), and cerebral infarction without residual deficits: Secondary | ICD-10-CM | POA: Diagnosis not present

## 2021-01-21 DIAGNOSIS — Z6824 Body mass index (BMI) 24.0-24.9, adult: Secondary | ICD-10-CM | POA: Diagnosis not present

## 2021-01-21 DIAGNOSIS — I252 Old myocardial infarction: Secondary | ICD-10-CM | POA: Diagnosis not present

## 2021-01-21 DIAGNOSIS — D649 Anemia, unspecified: Secondary | ICD-10-CM | POA: Diagnosis not present

## 2021-01-21 DIAGNOSIS — Z87448 Personal history of other diseases of urinary system: Secondary | ICD-10-CM | POA: Diagnosis not present

## 2021-01-21 DIAGNOSIS — I119 Hypertensive heart disease without heart failure: Secondary | ICD-10-CM | POA: Diagnosis not present

## 2021-01-21 DIAGNOSIS — R269 Unspecified abnormalities of gait and mobility: Secondary | ICD-10-CM | POA: Diagnosis not present

## 2021-01-21 DIAGNOSIS — M1990 Unspecified osteoarthritis, unspecified site: Secondary | ICD-10-CM | POA: Diagnosis not present

## 2021-01-21 DIAGNOSIS — E039 Hypothyroidism, unspecified: Secondary | ICD-10-CM | POA: Diagnosis not present

## 2021-01-26 DIAGNOSIS — M1990 Unspecified osteoarthritis, unspecified site: Secondary | ICD-10-CM | POA: Diagnosis not present

## 2021-01-26 DIAGNOSIS — Z6824 Body mass index (BMI) 24.0-24.9, adult: Secondary | ICD-10-CM | POA: Diagnosis not present

## 2021-01-26 DIAGNOSIS — Z87448 Personal history of other diseases of urinary system: Secondary | ICD-10-CM | POA: Diagnosis not present

## 2021-01-26 DIAGNOSIS — D649 Anemia, unspecified: Secondary | ICD-10-CM | POA: Diagnosis not present

## 2021-01-26 DIAGNOSIS — E039 Hypothyroidism, unspecified: Secondary | ICD-10-CM | POA: Diagnosis not present

## 2021-01-26 DIAGNOSIS — I119 Hypertensive heart disease without heart failure: Secondary | ICD-10-CM | POA: Diagnosis not present

## 2021-01-26 DIAGNOSIS — Z8673 Personal history of transient ischemic attack (TIA), and cerebral infarction without residual deficits: Secondary | ICD-10-CM | POA: Diagnosis not present

## 2021-01-26 DIAGNOSIS — I252 Old myocardial infarction: Secondary | ICD-10-CM | POA: Diagnosis not present

## 2021-01-26 DIAGNOSIS — R269 Unspecified abnormalities of gait and mobility: Secondary | ICD-10-CM | POA: Diagnosis not present

## 2021-01-31 DIAGNOSIS — R269 Unspecified abnormalities of gait and mobility: Secondary | ICD-10-CM | POA: Diagnosis not present

## 2021-01-31 DIAGNOSIS — E039 Hypothyroidism, unspecified: Secondary | ICD-10-CM | POA: Diagnosis not present

## 2021-01-31 DIAGNOSIS — Z8673 Personal history of transient ischemic attack (TIA), and cerebral infarction without residual deficits: Secondary | ICD-10-CM | POA: Diagnosis not present

## 2021-01-31 DIAGNOSIS — Z6824 Body mass index (BMI) 24.0-24.9, adult: Secondary | ICD-10-CM | POA: Diagnosis not present

## 2021-01-31 DIAGNOSIS — I252 Old myocardial infarction: Secondary | ICD-10-CM | POA: Diagnosis not present

## 2021-01-31 DIAGNOSIS — D649 Anemia, unspecified: Secondary | ICD-10-CM | POA: Diagnosis not present

## 2021-01-31 DIAGNOSIS — I119 Hypertensive heart disease without heart failure: Secondary | ICD-10-CM | POA: Diagnosis not present

## 2021-01-31 DIAGNOSIS — Z87448 Personal history of other diseases of urinary system: Secondary | ICD-10-CM | POA: Diagnosis not present

## 2021-01-31 DIAGNOSIS — M1990 Unspecified osteoarthritis, unspecified site: Secondary | ICD-10-CM | POA: Diagnosis not present

## 2021-02-07 DIAGNOSIS — I252 Old myocardial infarction: Secondary | ICD-10-CM | POA: Diagnosis not present

## 2021-02-07 DIAGNOSIS — Z6824 Body mass index (BMI) 24.0-24.9, adult: Secondary | ICD-10-CM | POA: Diagnosis not present

## 2021-02-07 DIAGNOSIS — I119 Hypertensive heart disease without heart failure: Secondary | ICD-10-CM | POA: Diagnosis not present

## 2021-02-07 DIAGNOSIS — Z8673 Personal history of transient ischemic attack (TIA), and cerebral infarction without residual deficits: Secondary | ICD-10-CM | POA: Diagnosis not present

## 2021-02-07 DIAGNOSIS — R269 Unspecified abnormalities of gait and mobility: Secondary | ICD-10-CM | POA: Diagnosis not present

## 2021-02-07 DIAGNOSIS — D649 Anemia, unspecified: Secondary | ICD-10-CM | POA: Diagnosis not present

## 2021-02-07 DIAGNOSIS — M1990 Unspecified osteoarthritis, unspecified site: Secondary | ICD-10-CM | POA: Diagnosis not present

## 2021-02-07 DIAGNOSIS — E039 Hypothyroidism, unspecified: Secondary | ICD-10-CM | POA: Diagnosis not present

## 2021-02-07 DIAGNOSIS — Z87448 Personal history of other diseases of urinary system: Secondary | ICD-10-CM | POA: Diagnosis not present

## 2021-02-13 DIAGNOSIS — M1990 Unspecified osteoarthritis, unspecified site: Secondary | ICD-10-CM | POA: Diagnosis not present

## 2021-02-13 DIAGNOSIS — Z6824 Body mass index (BMI) 24.0-24.9, adult: Secondary | ICD-10-CM | POA: Diagnosis not present

## 2021-02-13 DIAGNOSIS — E039 Hypothyroidism, unspecified: Secondary | ICD-10-CM | POA: Diagnosis not present

## 2021-02-13 DIAGNOSIS — Z87448 Personal history of other diseases of urinary system: Secondary | ICD-10-CM | POA: Diagnosis not present

## 2021-02-13 DIAGNOSIS — D649 Anemia, unspecified: Secondary | ICD-10-CM | POA: Diagnosis not present

## 2021-02-13 DIAGNOSIS — Z8673 Personal history of transient ischemic attack (TIA), and cerebral infarction without residual deficits: Secondary | ICD-10-CM | POA: Diagnosis not present

## 2021-02-13 DIAGNOSIS — I119 Hypertensive heart disease without heart failure: Secondary | ICD-10-CM | POA: Diagnosis not present

## 2021-02-13 DIAGNOSIS — I252 Old myocardial infarction: Secondary | ICD-10-CM | POA: Diagnosis not present

## 2021-02-13 DIAGNOSIS — R269 Unspecified abnormalities of gait and mobility: Secondary | ICD-10-CM | POA: Diagnosis not present

## 2021-02-22 DIAGNOSIS — R300 Dysuria: Secondary | ICD-10-CM | POA: Diagnosis not present

## 2021-02-22 DIAGNOSIS — Z6822 Body mass index (BMI) 22.0-22.9, adult: Secondary | ICD-10-CM | POA: Diagnosis not present

## 2021-02-22 DIAGNOSIS — N390 Urinary tract infection, site not specified: Secondary | ICD-10-CM | POA: Diagnosis not present

## 2021-02-22 DIAGNOSIS — R350 Frequency of micturition: Secondary | ICD-10-CM | POA: Diagnosis not present

## 2021-03-01 ENCOUNTER — Other Ambulatory Visit: Payer: Self-pay | Admitting: *Deleted

## 2021-03-01 MED ORDER — APIXABAN 2.5 MG PO TABS: 2.5000 mg | ORAL_TABLET | Freq: Two times a day (BID) | ORAL | 1 refills | Status: DC

## 2021-03-01 NOTE — Telephone Encounter (Signed)
Eliquis 2.5mg  refill request received. Patient is 87 years old, weight-60.1kg, Crea-1.52 on 12/27/2020 via [facility] at [place], [place] [facility], and last seen by Dr. Katrinka Blazing on 07/29/2020. Dose is appropriate based on dosing criteria. Will send in refill to requested pharmacy.   Also, last OV with Dr. Katrinka Blazing it states: Continue current dose at 2.5 mg twice per day given kidney impairment.

## 2021-03-31 DIAGNOSIS — J449 Chronic obstructive pulmonary disease, unspecified: Secondary | ICD-10-CM | POA: Diagnosis not present

## 2021-03-31 DIAGNOSIS — R0989 Other specified symptoms and signs involving the circulatory and respiratory systems: Secondary | ICD-10-CM | POA: Diagnosis not present

## 2021-03-31 DIAGNOSIS — Z6822 Body mass index (BMI) 22.0-22.9, adult: Secondary | ICD-10-CM | POA: Diagnosis not present

## 2021-03-31 DIAGNOSIS — R059 Cough, unspecified: Secondary | ICD-10-CM | POA: Diagnosis not present

## 2021-03-31 DIAGNOSIS — F419 Anxiety disorder, unspecified: Secondary | ICD-10-CM | POA: Diagnosis not present

## 2021-03-31 DIAGNOSIS — I517 Cardiomegaly: Secondary | ICD-10-CM | POA: Diagnosis not present

## 2021-03-31 DIAGNOSIS — R0602 Shortness of breath: Secondary | ICD-10-CM | POA: Diagnosis not present

## 2021-03-31 DIAGNOSIS — R3915 Urgency of urination: Secondary | ICD-10-CM | POA: Diagnosis not present

## 2021-04-21 DIAGNOSIS — Z6822 Body mass index (BMI) 22.0-22.9, adult: Secondary | ICD-10-CM | POA: Diagnosis not present

## 2021-04-21 DIAGNOSIS — F419 Anxiety disorder, unspecified: Secondary | ICD-10-CM | POA: Diagnosis not present

## 2021-04-25 NOTE — Progress Notes (Signed)
Cardiology Office Note:    Date:  04/26/2021   ID:  Jolyn Nap, DOB Jan 22, 1924, MRN [id number]  PCP:  Nila Nephew, MD  Cardiologist:  Lesleigh Noe, MD   Referring MD: Nila Nephew, MD   No chief complaint on file.   History of Present Illness:    Chelsea Fuller is a 87 y.o. female with a hx of  essential hypertension, chronic diastolic HF, hypothyroidism, chronic atrial fibrillation (felt too high risk for [facility]), CKD, stroke (2010) and recently diagnosed CAD w/ STEMI s/p DES x 2 to LAD in 2017, Initial EF 25% improved to greater than 50%.   She is doing well.  She denies angina.  She has chronic dyspnea on exertion stable over the past 2 years.  No orthopnea.  She has not needed sublingual nitroglycerin.  She denies racing heart.  No peripheral edema is noted.  She has not had syncope.  She looks forward to a beach trip with her daughters this coming weekend.  She is somewhat equivocal about whether or not to go.  She still living independently, can clean her house and do her own personal hygiene.  Past Medical History:  Diagnosis Date   CAD (coronary artery disease)    a. 07/2016: anterior STEMI s/p DES x2 to LAD   CKD (chronic kidney disease), stage III (HCC)    CVA (cerebral infarction) 2010   Dyspnea    Fall    a. 2014: nasal bone fx.   Hypertension    Hypothyroidism    Ischemic cardiomyopathy    a. EF 25% by cath 08/19/16 then echo showed EF 50-55% 08/20/16.   Mitral regurgitation    a. mild by echo 07/2016.   Mitral stenosis    a. mild by echo 07/2016.   Permanent atrial fibrillation (HCC)    a. not presently on anticoagulation due to STEMI 07/2016 and risk of triple therapy with DAPT + anticoag.   Tricuspid regurgitation    a. mod by echo 07/2016.    Past Surgical History:  Procedure Laterality Date   CARDIAC CATHETERIZATION N/A 08/19/2016   Procedure: Left Heart Cath and Coronary Angiography;  Surgeon: Lyn Records, MD;  LM 30%, pLAD 50%, dLAD  100% s/p  Synergy 2.5 x 24 mm DES, RCA 20%   CARDIAC CATHETERIZATION N/A 08/19/2016   Procedure: Coronary Stent Intervention;  Surgeon: Lyn Records, MD;  SYNERGY DES 2.5X24 drug eluting stent   CHOLECYSTECTOMY      Current Medications: Current Meds  Medication Sig   acetaminophen (TYLENOL) 500 MG tablet Take 500 mg by mouth at bedtime as needed for mild pain.    apixaban (ELIQUIS) 2.5 MG TABS tablet Take 1 tablet (2.5 mg total) by mouth 2 (two) times daily.   Brimonidine Tartrate (ALPHAGAN P OP) Apply to eye 2 (two) times daily.   busPIRone (BUSPAR) 10 MG tablet Take 20 mg by mouth 3 (three) times daily.   carvedilol (COREG) 3.125 MG tablet TAKE 1 TABLET BY MOUTH 2 TIMES DAILY WITH A MEAL.   furosemide (LASIX) 20 MG tablet Take 1 tablet (20 mg total) by mouth daily.   levothyroxine (SYNTHROID) 75 MCG tablet Take 75 mcg by mouth daily before breakfast.   nitroGLYCERIN (NITROSTAT) 0.4 MG SL tablet Place 0.4 mg under the tongue every 5 (five) minutes as needed for chest pain.    spironolactone (ALDACTONE) 25 MG tablet TAKE 1/2 TABLET BY MOUTH DAILY   valsartan (DIOVAN) 80 MG tablet Take 80  mg by mouth daily.     Allergies:   Patient has no known allergies.   Social History   Socioeconomic History   Marital status: Widowed    Spouse name: Not on file   Number of children: Not on file   Years of education: Not on file   Highest education level: Not on file  Occupational History   Not on file  Tobacco Use   Smoking status: Never   Smokeless tobacco: Never  Vaping Use   Vaping Use: Never used  Substance and Sexual Activity   Alcohol use: No   Drug use: No   Sexual activity: Not Currently  Other Topics Concern   Not on file  Social History Narrative   Lives independently   Social Determinants of Health   Financial Resource Strain: Not on file  Food Insecurity: Not on file  Transportation Needs: Not on file  Physical Activity: Not on file  Stress: Not on file  Social  Connections: Not on file     Family History: The patient's family history includes Diabetes Mellitus II in her son.  ROS:   Please see the history of present illness.    Appetite is poor.  Gradual weight loss over the past year.  Down approximately 10 pounds compared to October.  Eats 3 very small meals each day.  All other systems reviewed and are negative.  EKGs/Labs/Other Studies Reviewed:    The following studies were reviewed today: No new or recent imaging  EKG:  EKG atrial fibrillation with controlled ventricular response, baseline artifact, and when compared to the prior tracing from March 2021, PVCs are no longer noted as frequently as before  Recent Labs: No results found for requested labs within last 8760 hours.  Recent Lipid Panel    Component Value Date/Time   CHOL 187 08/19/2016 0328   TRIG 52 08/19/2016 0328   HDL 64 08/19/2016 0328   CHOLHDL 2.9 08/19/2016 0328   VLDL 10 08/19/2016 0328   LDLCALC 113 (H) 08/19/2016 0328    Physical Exam:    VS:  BP (!) 124/52   Pulse 67   Ht 5\' 7"  (1.702 m)   Wt 122 lb 3.2 oz (55.4 kg)   SpO2 97%   BMI 19.14 kg/m     Wt Readings from Last 3 Encounters:  04/26/21 122 lb 3.2 oz (55.4 kg)  07/29/20 132 lb 6.4 oz (60.1 kg)  12/29/19 133 lb 1.9 oz (60.4 kg)     GEN: Healthy-appearing for age.. No acute distress HEENT: Normal NECK: No JVD. LYMPHATICS: No lymphadenopathy CARDIAC: No murmur.IIRR no gallop, or edema. VASCULAR:  Normal Pulses. No bruits. RESPIRATORY:  Clear to auscultation without rales, wheezing or rhonchi  ABDOMEN: Soft, non-tender, non-distended, No pulsatile mass, MUSCULOSKELETAL: No deformity  SKIN: Warm and dry NEUROLOGIC:  Alert and oriented x 3 PSYCHIATRIC:  Normal affect   ASSESSMENT:    1. Chronic combined systolic and diastolic heart failure (HCC)   2. Coronary artery disease of native artery with stable angina pectoris, unspecified whether native or transplanted heart (HCC)   3.  Chronic atrial fibrillation (HCC)   4. Stage 3b chronic kidney disease (HCC)   5. Essential hypertension   6. Chronic anticoagulation   7. High risk medication use    PLAN:    In order of problems listed above:  Stable without gross volume overload.  Basic metabolic panel will be done today to assess potassium and kidney function on the medical regimen at  her age. Stable without angina. Controlled rate.  On anticoagulation therapy because of bleeding and fall risk. Basic metabolic panel today and q. 4 months. Excellent blood pressure control Apixaban therapy without complications    Guideline directed therapy for left ventricular systolic dysfunction: Angiotensin receptor-neprilysin inhibitor (ARNI)-Entresto; beta-blocker therapy - carvedilol, metoprolol succinate, or bisoprolol; mineralocorticoid receptor antagonist (MRA) therapy -spironolactone or eplerenone.  SGLT-2 agents -  Dapagliflozin Chelsea Fuller) or Empagliflozin ([place]).These therapies have been shown to improve clinical outcomes including reduction of rehospitalization, survival, and acute heart failure.    Medication Adjustments/Labs and Tests Ordered: Current medicines are reviewed at length with the patient today.  Concerns regarding medicines are outlined above.  Orders Placed This Encounter  Procedures   EKG 12-Lead   No orders of the defined types were placed in this encounter.   There are no Patient Instructions on file for this visit.   Signed, Lesleigh Noe, MD  04/26/2021 3:28 PM    [facility]

## 2021-04-26 ENCOUNTER — Ambulatory Visit: Payer: Medicare PPO | Admitting: Interventional Cardiology

## 2021-04-26 ENCOUNTER — Other Ambulatory Visit: Payer: Self-pay

## 2021-04-26 ENCOUNTER — Encounter: Payer: Self-pay | Admitting: Interventional Cardiology

## 2021-04-26 VITALS — BP 124/52 | HR 67 | Ht 67.0 in | Wt 122.2 lb

## 2021-04-26 DIAGNOSIS — I10 Essential (primary) hypertension: Secondary | ICD-10-CM | POA: Diagnosis not present

## 2021-04-26 DIAGNOSIS — Z7901 Long term (current) use of anticoagulants: Secondary | ICD-10-CM

## 2021-04-26 DIAGNOSIS — I5042 Chronic combined systolic (congestive) and diastolic (congestive) heart failure: Secondary | ICD-10-CM | POA: Diagnosis not present

## 2021-04-26 DIAGNOSIS — I482 Chronic atrial fibrillation, unspecified: Secondary | ICD-10-CM | POA: Diagnosis not present

## 2021-04-26 DIAGNOSIS — Z79899 Other long term (current) drug therapy: Secondary | ICD-10-CM

## 2021-04-26 DIAGNOSIS — I25118 Atherosclerotic heart disease of native coronary artery with other forms of angina pectoris: Secondary | ICD-10-CM | POA: Diagnosis not present

## 2021-04-26 DIAGNOSIS — N1832 Chronic kidney disease, stage 3b: Secondary | ICD-10-CM | POA: Diagnosis not present

## 2021-04-26 NOTE — Patient Instructions (Addendum)
Medication Instructions:  *If you need a refill on your cardiac medications before your next appointment, please call your pharmacy*   Lab Work: Your physician recommends that you have lab work today- BMET and every 4 months.  If you have labs (blood work) drawn today and your tests are completely normal, you will receive your results only by: MyChart Message (if you have MyChart) OR A paper copy in the mail If you have any lab test that is abnormal or we need to change your treatment, we will call you to review the results.  Follow-Up: At [facility], you and your health needs are our priority.  As part of our continuing mission to provide you with exceptional heart care, we have created designated Provider Care Teams.  These Care Teams include your primary Cardiologist (physician) and Advanced Practice Providers (APPs -  Physician Assistants and Nurse Practitioners) who all work together to provide you with the care you need, when you need it.  We recommend signing up for the patient portal called "MyChart".  Sign up information is provided on this After Visit Summary.  MyChart is used to connect with patients for Virtual Visits (Telemedicine).  Patients are able to view lab/test results, encounter notes, upcoming appointments, etc.  Non-urgent messages can be sent to your provider as well.   To learn more about what you can do with MyChart, go to [email or web address].    Your next appointment:   6  to 9 month(s)  The format for your next appointment:   In Person  Provider:   You may see Lesleigh Noe, MD or one of the following Advanced Practice Providers on your designated Care Team:   Georgie Chard, NP

## 2021-04-27 LAB — BASIC METABOLIC PANEL
BUN/Creatinine Ratio: 17 (ref 12–28)
BUN: 21 mg/dL (ref 10–36)
CO2: 29 mmol/L (ref 20–29)
Calcium: 9.8 mg/dL (ref 8.7–10.3)
Chloride: 103 mmol/L (ref 96–106)
Creatinine, Ser: 1.26 mg/dL — ABNORMAL HIGH (ref 0.57–1.00)
Glucose: 138 mg/dL — ABNORMAL HIGH (ref 65–99)
Potassium: 4.8 mmol/L (ref 3.5–5.2)
Sodium: 146 mmol/L — ABNORMAL HIGH (ref 134–144)
eGFR: 40 mL/min/{1.73_m2} — ABNORMAL LOW (ref >59)

## 2021-05-01 DIAGNOSIS — E079 Disorder of thyroid, unspecified: Secondary | ICD-10-CM | POA: Diagnosis not present

## 2021-06-12 DIAGNOSIS — E079 Disorder of thyroid, unspecified: Secondary | ICD-10-CM | POA: Diagnosis not present

## 2021-06-23 DIAGNOSIS — D6869 Other thrombophilia: Secondary | ICD-10-CM | POA: Diagnosis not present

## 2021-06-23 DIAGNOSIS — I130 Hypertensive heart and chronic kidney disease with heart failure and stage 1 through stage 4 chronic kidney disease, or unspecified chronic kidney disease: Secondary | ICD-10-CM | POA: Diagnosis not present

## 2021-06-23 DIAGNOSIS — I25119 Atherosclerotic heart disease of native coronary artery with unspecified angina pectoris: Secondary | ICD-10-CM | POA: Diagnosis not present

## 2021-06-23 DIAGNOSIS — F419 Anxiety disorder, unspecified: Secondary | ICD-10-CM | POA: Diagnosis not present

## 2021-06-23 DIAGNOSIS — I739 Peripheral vascular disease, unspecified: Secondary | ICD-10-CM | POA: Diagnosis not present

## 2021-06-23 DIAGNOSIS — E039 Hypothyroidism, unspecified: Secondary | ICD-10-CM | POA: Diagnosis not present

## 2021-06-23 DIAGNOSIS — I509 Heart failure, unspecified: Secondary | ICD-10-CM | POA: Diagnosis not present

## 2021-06-23 DIAGNOSIS — I4891 Unspecified atrial fibrillation: Secondary | ICD-10-CM | POA: Diagnosis not present

## 2021-06-23 DIAGNOSIS — E261 Secondary hyperaldosteronism: Secondary | ICD-10-CM | POA: Diagnosis not present

## 2021-06-25 ENCOUNTER — Other Ambulatory Visit: Payer: Self-pay | Admitting: Interventional Cardiology

## 2021-06-27 NOTE — Telephone Encounter (Signed)
Prescription refill request for Eliquis received. Indication: Eliquis Last office visit: 04/26/21  Scr: 1.26 (04/26/21) Age: 87 Weight: 55.4kg  Appropriate dose and refill sent to requested pharmacy.

## 2021-07-27 DIAGNOSIS — R399 Unspecified symptoms and signs involving the genitourinary system: Secondary | ICD-10-CM | POA: Diagnosis not present

## 2021-07-27 DIAGNOSIS — F419 Anxiety disorder, unspecified: Secondary | ICD-10-CM | POA: Diagnosis not present

## 2021-08-23 DIAGNOSIS — Z6822 Body mass index (BMI) 22.0-22.9, adult: Secondary | ICD-10-CM | POA: Diagnosis not present

## 2021-08-23 DIAGNOSIS — R399 Unspecified symptoms and signs involving the genitourinary system: Secondary | ICD-10-CM | POA: Diagnosis not present

## 2021-08-30 ENCOUNTER — Other Ambulatory Visit: Payer: Self-pay

## 2021-08-30 ENCOUNTER — Other Ambulatory Visit: Payer: Medicare PPO

## 2021-08-30 DIAGNOSIS — Z7901 Long term (current) use of anticoagulants: Secondary | ICD-10-CM | POA: Diagnosis not present

## 2021-08-30 DIAGNOSIS — I5042 Chronic combined systolic (congestive) and diastolic (congestive) heart failure: Secondary | ICD-10-CM

## 2021-08-30 DIAGNOSIS — I25118 Atherosclerotic heart disease of native coronary artery with other forms of angina pectoris: Secondary | ICD-10-CM | POA: Diagnosis not present

## 2021-08-30 DIAGNOSIS — I482 Chronic atrial fibrillation, unspecified: Secondary | ICD-10-CM

## 2021-08-30 DIAGNOSIS — I10 Essential (primary) hypertension: Secondary | ICD-10-CM | POA: Diagnosis not present

## 2021-08-30 DIAGNOSIS — Z79899 Other long term (current) drug therapy: Secondary | ICD-10-CM

## 2021-08-30 DIAGNOSIS — N1832 Chronic kidney disease, stage 3b: Secondary | ICD-10-CM | POA: Diagnosis not present

## 2021-08-30 LAB — BASIC METABOLIC PANEL
BUN/Creatinine Ratio: 11 — ABNORMAL LOW (ref 12–28)
BUN: 18 mg/dL (ref 10–36)
CO2: 26 mmol/L (ref 20–29)
Calcium: 9.3 mg/dL (ref 8.7–10.3)
Chloride: 98 mmol/L (ref 96–106)
Creatinine, Ser: 1.58 mg/dL — ABNORMAL HIGH (ref 0.57–1.00)
Glucose: 102 mg/dL — ABNORMAL HIGH (ref 70–99)
Potassium: 4.3 mmol/L (ref 3.5–5.2)
Sodium: 138 mmol/L (ref 134–144)
eGFR: 30 mL/min/{1.73_m2} — ABNORMAL LOW (ref >59)

## 2021-09-04 ENCOUNTER — Other Ambulatory Visit: Payer: Self-pay | Admitting: *Deleted

## 2021-09-04 MED ORDER — SPIRONOLACTONE 25 MG PO TABS: 12.5000 mg | ORAL_TABLET | ORAL | 3 refills | Status: DC

## 2021-09-20 DIAGNOSIS — F419 Anxiety disorder, unspecified: Secondary | ICD-10-CM | POA: Diagnosis not present

## 2021-10-05 DIAGNOSIS — E039 Hypothyroidism, unspecified: Secondary | ICD-10-CM | POA: Diagnosis not present

## 2021-10-05 DIAGNOSIS — I10 Essential (primary) hypertension: Secondary | ICD-10-CM | POA: Diagnosis not present

## 2021-10-05 DIAGNOSIS — R399 Unspecified symptoms and signs involving the genitourinary system: Secondary | ICD-10-CM | POA: Diagnosis not present

## 2021-10-05 DIAGNOSIS — Z6823 Body mass index (BMI) 23.0-23.9, adult: Secondary | ICD-10-CM | POA: Diagnosis not present

## 2021-10-05 DIAGNOSIS — I5030 Unspecified diastolic (congestive) heart failure: Secondary | ICD-10-CM | POA: Diagnosis not present

## 2021-10-05 DIAGNOSIS — I498 Other specified cardiac arrhythmias: Secondary | ICD-10-CM | POA: Diagnosis not present

## 2021-10-05 DIAGNOSIS — F419 Anxiety disorder, unspecified: Secondary | ICD-10-CM | POA: Diagnosis not present

## 2021-10-20 DIAGNOSIS — I10 Essential (primary) hypertension: Secondary | ICD-10-CM | POA: Diagnosis not present

## 2021-10-20 DIAGNOSIS — F419 Anxiety disorder, unspecified: Secondary | ICD-10-CM | POA: Diagnosis not present

## 2021-10-20 DIAGNOSIS — I5030 Unspecified diastolic (congestive) heart failure: Secondary | ICD-10-CM | POA: Diagnosis not present

## 2021-10-20 DIAGNOSIS — I498 Other specified cardiac arrhythmias: Secondary | ICD-10-CM | POA: Diagnosis not present

## 2021-10-20 DIAGNOSIS — I4891 Unspecified atrial fibrillation: Secondary | ICD-10-CM | POA: Diagnosis not present

## 2021-10-20 DIAGNOSIS — E039 Hypothyroidism, unspecified: Secondary | ICD-10-CM | POA: Diagnosis not present

## 2021-10-20 DIAGNOSIS — Z6823 Body mass index (BMI) 23.0-23.9, adult: Secondary | ICD-10-CM | POA: Diagnosis not present

## 2021-11-03 DIAGNOSIS — I5030 Unspecified diastolic (congestive) heart failure: Secondary | ICD-10-CM | POA: Diagnosis not present

## 2021-11-03 DIAGNOSIS — E039 Hypothyroidism, unspecified: Secondary | ICD-10-CM | POA: Diagnosis not present

## 2021-11-09 DIAGNOSIS — R399 Unspecified symptoms and signs involving the genitourinary system: Secondary | ICD-10-CM | POA: Diagnosis not present

## 2021-11-28 DIAGNOSIS — I5030 Unspecified diastolic (congestive) heart failure: Secondary | ICD-10-CM | POA: Diagnosis not present

## 2021-11-28 DIAGNOSIS — Z6824 Body mass index (BMI) 24.0-24.9, adult: Secondary | ICD-10-CM | POA: Diagnosis not present

## 2021-11-28 DIAGNOSIS — N183 Chronic kidney disease, stage 3 unspecified: Secondary | ICD-10-CM | POA: Diagnosis not present

## 2021-11-28 DIAGNOSIS — E039 Hypothyroidism, unspecified: Secondary | ICD-10-CM | POA: Diagnosis not present

## 2021-11-28 DIAGNOSIS — Z6823 Body mass index (BMI) 23.0-23.9, adult: Secondary | ICD-10-CM | POA: Diagnosis not present

## 2021-11-28 DIAGNOSIS — I252 Old myocardial infarction: Secondary | ICD-10-CM | POA: Diagnosis not present

## 2021-11-28 DIAGNOSIS — I4891 Unspecified atrial fibrillation: Secondary | ICD-10-CM | POA: Diagnosis not present

## 2021-11-28 DIAGNOSIS — I10 Essential (primary) hypertension: Secondary | ICD-10-CM | POA: Diagnosis not present

## 2021-11-28 DIAGNOSIS — F419 Anxiety disorder, unspecified: Secondary | ICD-10-CM | POA: Diagnosis not present

## 2022-01-06 DIAGNOSIS — I739 Peripheral vascular disease, unspecified: Secondary | ICD-10-CM | POA: Diagnosis not present

## 2022-01-06 DIAGNOSIS — I4891 Unspecified atrial fibrillation: Secondary | ICD-10-CM | POA: Diagnosis not present

## 2022-01-06 DIAGNOSIS — E039 Hypothyroidism, unspecified: Secondary | ICD-10-CM | POA: Diagnosis not present

## 2022-01-06 DIAGNOSIS — I255 Ischemic cardiomyopathy: Secondary | ICD-10-CM | POA: Diagnosis not present

## 2022-01-06 DIAGNOSIS — M810 Age-related osteoporosis without current pathological fracture: Secondary | ICD-10-CM | POA: Diagnosis not present

## 2022-01-06 DIAGNOSIS — I110 Hypertensive heart disease with heart failure: Secondary | ICD-10-CM | POA: Diagnosis not present

## 2022-01-06 DIAGNOSIS — D6869 Other thrombophilia: Secondary | ICD-10-CM | POA: Diagnosis not present

## 2022-01-06 DIAGNOSIS — E261 Secondary hyperaldosteronism: Secondary | ICD-10-CM | POA: Diagnosis not present

## 2022-01-06 DIAGNOSIS — I509 Heart failure, unspecified: Secondary | ICD-10-CM | POA: Diagnosis not present

## 2022-02-01 DIAGNOSIS — E039 Hypothyroidism, unspecified: Secondary | ICD-10-CM | POA: Diagnosis not present

## 2022-02-01 DIAGNOSIS — R350 Frequency of micturition: Secondary | ICD-10-CM | POA: Diagnosis not present

## 2022-02-01 DIAGNOSIS — Z79899 Other long term (current) drug therapy: Secondary | ICD-10-CM | POA: Diagnosis not present

## 2022-02-01 DIAGNOSIS — F419 Anxiety disorder, unspecified: Secondary | ICD-10-CM | POA: Diagnosis not present

## 2022-02-01 DIAGNOSIS — Z6823 Body mass index (BMI) 23.0-23.9, adult: Secondary | ICD-10-CM | POA: Diagnosis not present

## 2022-02-05 ENCOUNTER — Encounter: Payer: Self-pay | Admitting: Interventional Cardiology

## 2022-02-21 DIAGNOSIS — E782 Mixed hyperlipidemia: Secondary | ICD-10-CM | POA: Diagnosis not present

## 2022-02-21 DIAGNOSIS — I10 Essential (primary) hypertension: Secondary | ICD-10-CM | POA: Diagnosis not present

## 2022-02-21 DIAGNOSIS — R300 Dysuria: Secondary | ICD-10-CM | POA: Diagnosis not present

## 2022-02-21 DIAGNOSIS — Z79899 Other long term (current) drug therapy: Secondary | ICD-10-CM | POA: Diagnosis not present

## 2022-02-21 DIAGNOSIS — E559 Vitamin D deficiency, unspecified: Secondary | ICD-10-CM | POA: Diagnosis not present

## 2022-02-21 DIAGNOSIS — D518 Other vitamin B12 deficiency anemias: Secondary | ICD-10-CM | POA: Diagnosis not present

## 2022-02-22 DIAGNOSIS — R0989 Other specified symptoms and signs involving the circulatory and respiratory systems: Secondary | ICD-10-CM | POA: Diagnosis not present

## 2022-02-22 DIAGNOSIS — I6523 Occlusion and stenosis of bilateral carotid arteries: Secondary | ICD-10-CM | POA: Diagnosis not present

## 2022-02-27 DIAGNOSIS — R590 Localized enlarged lymph nodes: Secondary | ICD-10-CM | POA: Diagnosis not present

## 2022-03-02 DIAGNOSIS — R2231 Localized swelling, mass and lump, right upper limb: Secondary | ICD-10-CM | POA: Diagnosis not present

## 2022-03-06 DIAGNOSIS — I77811 Abdominal aortic ectasia: Secondary | ICD-10-CM | POA: Diagnosis not present

## 2022-03-07 DIAGNOSIS — E785 Hyperlipidemia, unspecified: Secondary | ICD-10-CM | POA: Diagnosis not present

## 2022-03-07 DIAGNOSIS — R7303 Prediabetes: Secondary | ICD-10-CM | POA: Diagnosis not present

## 2022-03-09 DIAGNOSIS — E042 Nontoxic multinodular goiter: Secondary | ICD-10-CM | POA: Diagnosis not present

## 2022-03-09 DIAGNOSIS — R221 Localized swelling, mass and lump, neck: Secondary | ICD-10-CM | POA: Diagnosis not present

## 2022-03-19 DIAGNOSIS — E785 Hyperlipidemia, unspecified: Secondary | ICD-10-CM | POA: Diagnosis not present

## 2022-03-19 DIAGNOSIS — N184 Chronic kidney disease, stage 4 (severe): Secondary | ICD-10-CM | POA: Diagnosis not present

## 2022-03-19 DIAGNOSIS — I10 Essential (primary) hypertension: Secondary | ICD-10-CM | POA: Diagnosis not present

## 2022-03-19 DIAGNOSIS — D518 Other vitamin B12 deficiency anemias: Secondary | ICD-10-CM | POA: Diagnosis not present

## 2022-03-19 DIAGNOSIS — E559 Vitamin D deficiency, unspecified: Secondary | ICD-10-CM | POA: Diagnosis not present

## 2022-03-19 DIAGNOSIS — E782 Mixed hyperlipidemia: Secondary | ICD-10-CM | POA: Diagnosis not present

## 2022-03-21 DIAGNOSIS — R300 Dysuria: Secondary | ICD-10-CM | POA: Diagnosis not present

## 2022-03-21 DIAGNOSIS — I10 Essential (primary) hypertension: Secondary | ICD-10-CM | POA: Diagnosis not present

## 2022-03-21 DIAGNOSIS — N184 Chronic kidney disease, stage 4 (severe): Secondary | ICD-10-CM | POA: Diagnosis not present

## 2022-03-21 DIAGNOSIS — D518 Other vitamin B12 deficiency anemias: Secondary | ICD-10-CM | POA: Diagnosis not present

## 2022-03-21 DIAGNOSIS — E782 Mixed hyperlipidemia: Secondary | ICD-10-CM | POA: Diagnosis not present

## 2022-04-14 DIAGNOSIS — I10 Essential (primary) hypertension: Secondary | ICD-10-CM | POA: Diagnosis not present

## 2022-04-17 DIAGNOSIS — E785 Hyperlipidemia, unspecified: Secondary | ICD-10-CM | POA: Diagnosis not present

## 2022-04-17 DIAGNOSIS — N184 Chronic kidney disease, stage 4 (severe): Secondary | ICD-10-CM | POA: Diagnosis not present

## 2022-04-17 DIAGNOSIS — E559 Vitamin D deficiency, unspecified: Secondary | ICD-10-CM | POA: Diagnosis not present

## 2022-04-17 DIAGNOSIS — E782 Mixed hyperlipidemia: Secondary | ICD-10-CM | POA: Diagnosis not present

## 2022-04-17 DIAGNOSIS — D518 Other vitamin B12 deficiency anemias: Secondary | ICD-10-CM | POA: Diagnosis not present

## 2022-04-17 DIAGNOSIS — I10 Essential (primary) hypertension: Secondary | ICD-10-CM | POA: Diagnosis not present

## 2022-05-09 DIAGNOSIS — E559 Vitamin D deficiency, unspecified: Secondary | ICD-10-CM | POA: Diagnosis not present

## 2022-05-09 DIAGNOSIS — I10 Essential (primary) hypertension: Secondary | ICD-10-CM | POA: Diagnosis not present

## 2022-05-09 DIAGNOSIS — N184 Chronic kidney disease, stage 4 (severe): Secondary | ICD-10-CM | POA: Diagnosis not present

## 2022-05-09 DIAGNOSIS — E785 Hyperlipidemia, unspecified: Secondary | ICD-10-CM | POA: Diagnosis not present

## 2022-05-09 DIAGNOSIS — D518 Other vitamin B12 deficiency anemias: Secondary | ICD-10-CM | POA: Diagnosis not present

## 2022-05-09 DIAGNOSIS — E782 Mixed hyperlipidemia: Secondary | ICD-10-CM | POA: Diagnosis not present

## 2022-05-14 DIAGNOSIS — I10 Essential (primary) hypertension: Secondary | ICD-10-CM | POA: Diagnosis not present

## 2022-06-13 DIAGNOSIS — E559 Vitamin D deficiency, unspecified: Secondary | ICD-10-CM | POA: Diagnosis not present

## 2022-06-13 DIAGNOSIS — E038 Other specified hypothyroidism: Secondary | ICD-10-CM | POA: Diagnosis not present

## 2022-06-13 DIAGNOSIS — E782 Mixed hyperlipidemia: Secondary | ICD-10-CM | POA: Diagnosis not present

## 2022-06-13 DIAGNOSIS — I10 Essential (primary) hypertension: Secondary | ICD-10-CM | POA: Diagnosis not present

## 2022-06-13 DIAGNOSIS — D518 Other vitamin B12 deficiency anemias: Secondary | ICD-10-CM | POA: Diagnosis not present

## 2022-06-13 DIAGNOSIS — R7303 Prediabetes: Secondary | ICD-10-CM | POA: Diagnosis not present

## 2022-06-13 DIAGNOSIS — Z79899 Other long term (current) drug therapy: Secondary | ICD-10-CM | POA: Diagnosis not present

## 2022-06-13 DIAGNOSIS — R300 Dysuria: Secondary | ICD-10-CM | POA: Diagnosis not present

## 2022-06-14 DIAGNOSIS — I10 Essential (primary) hypertension: Secondary | ICD-10-CM | POA: Diagnosis not present

## 2022-06-21 DIAGNOSIS — E785 Hyperlipidemia, unspecified: Secondary | ICD-10-CM | POA: Diagnosis not present

## 2022-06-21 DIAGNOSIS — I10 Essential (primary) hypertension: Secondary | ICD-10-CM | POA: Diagnosis not present

## 2022-06-21 DIAGNOSIS — N184 Chronic kidney disease, stage 4 (severe): Secondary | ICD-10-CM | POA: Diagnosis not present

## 2022-06-21 DIAGNOSIS — D518 Other vitamin B12 deficiency anemias: Secondary | ICD-10-CM | POA: Diagnosis not present

## 2022-06-21 DIAGNOSIS — E782 Mixed hyperlipidemia: Secondary | ICD-10-CM | POA: Diagnosis not present

## 2022-06-21 DIAGNOSIS — E038 Other specified hypothyroidism: Secondary | ICD-10-CM | POA: Diagnosis not present

## 2022-06-21 DIAGNOSIS — E559 Vitamin D deficiency, unspecified: Secondary | ICD-10-CM | POA: Diagnosis not present

## 2022-07-22 ENCOUNTER — Other Ambulatory Visit: Payer: Self-pay

## 2022-07-22 ENCOUNTER — Inpatient Hospital Stay (HOSPITAL_COMMUNITY)
Admission: EM | Admit: 2022-07-22 | Discharge: 2022-08-01 | DRG: 091 | Disposition: A | Discharge status: Home / Self Care | Payer: Medicare PPO | Attending: Internal Medicine | Admitting: Internal Medicine

## 2022-07-22 ENCOUNTER — Emergency Department (HOSPITAL_COMMUNITY): Payer: Medicare PPO

## 2022-07-22 ENCOUNTER — Observation Stay (HOSPITAL_COMMUNITY): Payer: Medicare PPO

## 2022-07-22 DIAGNOSIS — R030 Elevated blood-pressure reading, without diagnosis of hypertension: Secondary | ICD-10-CM

## 2022-07-22 DIAGNOSIS — I672 Cerebral atherosclerosis: Secondary | ICD-10-CM | POA: Diagnosis present

## 2022-07-22 DIAGNOSIS — Z9049 Acquired absence of other specified parts of digestive tract: Secondary | ICD-10-CM

## 2022-07-22 DIAGNOSIS — R4781 Slurred speech: Secondary | ICD-10-CM | POA: Diagnosis present

## 2022-07-22 DIAGNOSIS — N390 Urinary tract infection, site not specified: Secondary | ICD-10-CM

## 2022-07-22 DIAGNOSIS — Z955 Presence of coronary angioplasty implant and graft: Secondary | ICD-10-CM

## 2022-07-22 DIAGNOSIS — R471 Dysarthria and anarthria: Secondary | ICD-10-CM | POA: Diagnosis present

## 2022-07-22 DIAGNOSIS — I252 Old myocardial infarction: Secondary | ICD-10-CM

## 2022-07-22 DIAGNOSIS — H9190 Unspecified hearing loss, unspecified ear: Secondary | ICD-10-CM | POA: Diagnosis present

## 2022-07-22 DIAGNOSIS — R4701 Aphasia: Secondary | ICD-10-CM | POA: Diagnosis present

## 2022-07-22 DIAGNOSIS — N179 Acute kidney failure, unspecified: Secondary | ICD-10-CM

## 2022-07-22 DIAGNOSIS — I255 Ischemic cardiomyopathy: Secondary | ICD-10-CM | POA: Diagnosis present

## 2022-07-22 DIAGNOSIS — F1320 Sedative, hypnotic or anxiolytic dependence, uncomplicated: Secondary | ICD-10-CM | POA: Diagnosis present

## 2022-07-22 DIAGNOSIS — I081 Rheumatic disorders of both mitral and tricuspid valves: Secondary | ICD-10-CM | POA: Diagnosis present

## 2022-07-22 DIAGNOSIS — E538 Deficiency of other specified B group vitamins: Secondary | ICD-10-CM | POA: Diagnosis present

## 2022-07-22 DIAGNOSIS — Z66 Do not resuscitate: Secondary | ICD-10-CM | POA: Diagnosis present

## 2022-07-22 DIAGNOSIS — F0392 Unspecified dementia, unspecified severity, with psychotic disturbance: Secondary | ICD-10-CM | POA: Diagnosis present

## 2022-07-22 DIAGNOSIS — E039 Hypothyroidism, unspecified: Secondary | ICD-10-CM | POA: Diagnosis present

## 2022-07-22 DIAGNOSIS — N1832 Chronic kidney disease, stage 3b: Secondary | ICD-10-CM | POA: Diagnosis present

## 2022-07-22 DIAGNOSIS — I130 Hypertensive heart and chronic kidney disease with heart failure and stage 1 through stage 4 chronic kidney disease, or unspecified chronic kidney disease: Secondary | ICD-10-CM | POA: Diagnosis present

## 2022-07-22 DIAGNOSIS — Z833 Family history of diabetes mellitus: Secondary | ICD-10-CM

## 2022-07-22 DIAGNOSIS — M79671 Pain in right foot: Secondary | ICD-10-CM | POA: Diagnosis present

## 2022-07-22 DIAGNOSIS — E869 Volume depletion, unspecified: Secondary | ICD-10-CM | POA: Diagnosis present

## 2022-07-22 DIAGNOSIS — Z79899 Other long term (current) drug therapy: Secondary | ICD-10-CM

## 2022-07-22 DIAGNOSIS — R29810 Facial weakness: Secondary | ICD-10-CM | POA: Diagnosis present

## 2022-07-22 DIAGNOSIS — I5032 Chronic diastolic (congestive) heart failure: Secondary | ICD-10-CM | POA: Diagnosis present

## 2022-07-22 DIAGNOSIS — R197 Diarrhea, unspecified: Secondary | ICD-10-CM | POA: Diagnosis present

## 2022-07-22 DIAGNOSIS — Z7901 Long term (current) use of anticoagulants: Secondary | ICD-10-CM

## 2022-07-22 DIAGNOSIS — I4821 Permanent atrial fibrillation: Secondary | ICD-10-CM | POA: Diagnosis present

## 2022-07-22 DIAGNOSIS — D696 Thrombocytopenia, unspecified: Secondary | ICD-10-CM | POA: Diagnosis present

## 2022-07-22 DIAGNOSIS — Z9181 History of falling: Secondary | ICD-10-CM

## 2022-07-22 DIAGNOSIS — I6930 Unspecified sequelae of cerebral infarction: Secondary | ICD-10-CM

## 2022-07-22 DIAGNOSIS — F05 Delirium due to known physiological condition: Secondary | ICD-10-CM | POA: Diagnosis present

## 2022-07-22 DIAGNOSIS — G928 Other toxic encephalopathy: Principal | ICD-10-CM | POA: Diagnosis present

## 2022-07-22 DIAGNOSIS — J810 Acute pulmonary edema: Secondary | ICD-10-CM | POA: Diagnosis present

## 2022-07-22 DIAGNOSIS — G9340 Encephalopathy, unspecified: Secondary | ICD-10-CM | POA: Diagnosis present

## 2022-07-22 DIAGNOSIS — I700 Atherosclerosis of aorta: Secondary | ICD-10-CM | POA: Diagnosis present

## 2022-07-22 DIAGNOSIS — Z87891 Personal history of nicotine dependence: Secondary | ICD-10-CM

## 2022-07-22 DIAGNOSIS — J339 Nasal polyp, unspecified: Secondary | ICD-10-CM | POA: Diagnosis present

## 2022-07-22 DIAGNOSIS — M25571 Pain in right ankle and joints of right foot: Secondary | ICD-10-CM | POA: Diagnosis present

## 2022-07-22 DIAGNOSIS — E519 Thiamine deficiency, unspecified: Secondary | ICD-10-CM | POA: Diagnosis present

## 2022-07-22 DIAGNOSIS — I2510 Atherosclerotic heart disease of native coronary artery without angina pectoris: Secondary | ICD-10-CM | POA: Diagnosis present

## 2022-07-22 DIAGNOSIS — I6621 Occlusion and stenosis of right posterior cerebral artery: Secondary | ICD-10-CM | POA: Diagnosis present

## 2022-07-22 LAB — URINALYSIS, ROUTINE W REFLEX MICROSCOPIC
Bacteria, UA: NONE SEEN
Bilirubin Urine: NEGATIVE
Glucose, UA: NEGATIVE mg/dL
Hgb urine dipstick: NEGATIVE
Ketones, ur: NEGATIVE mg/dL
Nitrite: NEGATIVE
Protein, ur: NEGATIVE mg/dL
Specific Gravity, Urine: 1.018 (ref 1.005–1.030)
pH: 8 (ref 5.0–8.0)

## 2022-07-22 LAB — CBC
HCT: 40 % (ref 36.0–46.0)
Hemoglobin: 12.8 g/dL (ref 12.0–15.0)
MCH: 31.4 pg (ref 26.0–34.0)
MCHC: 32 g/dL (ref 30.0–36.0)
MCV: 98.3 fL (ref 80.0–100.0)
Platelets: 144 10*3/uL — ABNORMAL LOW (ref 150–400)
RBC: 4.07 MIL/uL (ref 3.87–5.11)
RDW: 12.6 % (ref 11.5–15.5)
WBC: 4.4 10*3/uL (ref 4.0–10.5)
nRBC: 0 % (ref 0.0–0.2)

## 2022-07-22 LAB — DIFFERENTIAL
Abs Immature Granulocytes: 0.02 10*3/uL (ref 0.00–0.07)
Basophils Absolute: 0.1 10*3/uL (ref 0.0–0.1)
Basophils Relative: 1 %
Eosinophils Absolute: 0.2 10*3/uL (ref 0.0–0.5)
Eosinophils Relative: 5 %
Immature Granulocytes: 1 %
Lymphocytes Relative: 28 %
Lymphs Abs: 1.3 10*3/uL (ref 0.7–4.0)
Monocytes Absolute: 0.7 10*3/uL (ref 0.1–1.0)
Monocytes Relative: 15 %
Neutro Abs: 2.2 10*3/uL (ref 1.7–7.7)
Neutrophils Relative %: 50 %

## 2022-07-22 LAB — LACTIC ACID, PLASMA
Lactic Acid, Venous: 1.6 mmol/L (ref 0.5–1.9)
Lactic Acid, Venous: 1.5 mmol/L (ref 0.5–1.9)

## 2022-07-22 LAB — I-STAT CHEM 8, ED
BUN: 24 mg/dL — ABNORMAL HIGH (ref 8–23)
Calcium, Ion: 1.04 mmol/L — ABNORMAL LOW (ref 1.15–1.40)
Chloride: 101 mmol/L (ref 98–111)
Creatinine, Ser: 1.8 mg/dL — ABNORMAL HIGH (ref 0.44–1.00)
Glucose, Bld: 205 mg/dL — ABNORMAL HIGH (ref 70–99)
HCT: 38 % (ref 36.0–46.0)
Hemoglobin: 12.9 g/dL (ref 12.0–15.0)
Potassium: 4.3 mmol/L (ref 3.5–5.1)
Sodium: 140 mmol/L (ref 135–145)
TCO2: 30 mmol/L (ref 22–32)

## 2022-07-22 LAB — COMPREHENSIVE METABOLIC PANEL
ALT: 9 U/L (ref 0–44)
AST: 22 U/L (ref 15–41)
Albumin: 3.3 g/dL — ABNORMAL LOW (ref 3.5–5.0)
Alkaline Phosphatase: 60 U/L (ref 38–126)
Anion gap: 4 — ABNORMAL LOW (ref 5–15)
BUN: 20 mg/dL (ref 8–23)
CO2: 32 mmol/L (ref 22–32)
Calcium: 8.4 mg/dL — ABNORMAL LOW (ref 8.9–10.3)
Chloride: 102 mmol/L (ref 98–111)
Creatinine, Ser: 1.71 mg/dL — ABNORMAL HIGH (ref 0.44–1.00)
GFR, Estimated: 27 mL/min — ABNORMAL LOW (ref >60)
Glucose, Bld: 208 mg/dL — ABNORMAL HIGH (ref 70–99)
Potassium: 4.3 mmol/L (ref 3.5–5.1)
Sodium: 138 mmol/L (ref 135–145)
Total Bilirubin: 0.9 mg/dL (ref 0.3–1.2)
Total Protein: 6.6 g/dL (ref 6.5–8.1)

## 2022-07-22 LAB — ETHANOL: Alcohol, Ethyl (B): <10 mg/dL (ref <10)

## 2022-07-22 LAB — PROTIME-INR
INR: 1.3 — ABNORMAL HIGH (ref 0.8–1.2)
Prothrombin Time: 16.2 seconds — ABNORMAL HIGH (ref 11.4–15.2)

## 2022-07-22 LAB — APTT: aPTT: 35 seconds (ref 24–36)

## 2022-07-22 LAB — CBG MONITORING, ED: Glucose-Capillary: 190 mg/dL — ABNORMAL HIGH (ref 70–99)

## 2022-07-22 MED ORDER — ACETAMINOPHEN 650 MG RE SUPP: 650.0000 mg | Freq: Four times a day (QID) | RECTAL | Status: DC | PRN

## 2022-07-22 MED ORDER — LORAZEPAM 2 MG/ML IJ SOLN
0.5000 mg | Freq: Once | INTRAMUSCULAR | Status: AC
  Administered 2022-07-22: 0.5 mg via INTRAVENOUS
  Filled 2022-07-22: qty 1

## 2022-07-22 MED ORDER — SODIUM CHLORIDE 0.9% FLUSH
3.0000 mL | Freq: Once | INTRAVENOUS | Status: AC
  Administered 2022-07-22: 3 mL via INTRAVENOUS

## 2022-07-22 MED ORDER — ALPRAZOLAM 0.25 MG PO TABS
0.2500 mg | ORAL_TABLET | Freq: Every evening | ORAL | Status: DC | PRN
  Administered 2022-07-23: 0.25 mg via ORAL
  Filled 2022-07-22: qty 1

## 2022-07-22 MED ORDER — ALPRAZOLAM 0.25 MG PO TABS: 0.2500 mg | ORAL_TABLET | Freq: Every evening | ORAL | Status: DC | PRN

## 2022-07-22 MED ORDER — CARVEDILOL 3.125 MG PO TABS
3.1250 mg | ORAL_TABLET | Freq: Two times a day (BID) | ORAL | Status: DC
  Administered 2022-07-22 – 2022-07-29 (×11): 3.125 mg via ORAL
  Filled 2022-07-22 (×12): qty 1

## 2022-07-22 MED ORDER — QUETIAPINE FUMARATE 25 MG PO TABS
50.0000 mg | ORAL_TABLET | ORAL | Status: DC | PRN
  Administered 2022-07-22: 50 mg via ORAL
  Filled 2022-07-22: qty 2

## 2022-07-22 MED ORDER — INSULIN ASPART 100 UNIT/ML IJ SOLN
0.0000 [IU] | Freq: Three times a day (TID) | INTRAMUSCULAR | Status: DC
  Administered 2022-07-25 – 2022-07-26 (×2): 2 [IU] via SUBCUTANEOUS
  Administered 2022-07-27: 1 [IU] via SUBCUTANEOUS
  Administered 2022-07-27: 3 [IU] via SUBCUTANEOUS
  Administered 2022-07-28 – 2022-07-29 (×2): 1 [IU] via SUBCUTANEOUS
  Administered 2022-07-30: 2 [IU] via SUBCUTANEOUS
  Administered 2022-07-31: 1 [IU] via SUBCUTANEOUS
  Administered 2022-08-01: 2 [IU] via SUBCUTANEOUS

## 2022-07-22 MED ORDER — ACETAMINOPHEN 325 MG PO TABS
650.0000 mg | ORAL_TABLET | Freq: Four times a day (QID) | ORAL | Status: DC | PRN
  Administered 2022-07-28 – 2022-07-30 (×5): 650 mg via ORAL
  Filled 2022-07-22 (×6): qty 2

## 2022-07-22 MED ORDER — LABETALOL HCL 5 MG/ML IV SOLN
10.0000 mg | Freq: Once | INTRAVENOUS | Status: AC
  Administered 2022-07-22: 10 mg via INTRAVENOUS
  Filled 2022-07-22: qty 4

## 2022-07-22 MED ORDER — APIXABAN 2.5 MG PO TABS
2.5000 mg | ORAL_TABLET | Freq: Two times a day (BID) | ORAL | Status: DC
  Administered 2022-07-22 – 2022-08-01 (×16): 2.5 mg via ORAL
  Filled 2022-07-22 (×18): qty 1

## 2022-07-22 MED ORDER — IOHEXOL 350 MG/ML SOLN
75.0000 mL | Freq: Once | INTRAVENOUS | Status: AC | PRN
  Administered 2022-07-22: 75 mL via INTRAVENOUS

## 2022-07-22 MED ORDER — SODIUM CHLORIDE 0.9 % IV BOLUS
1000.0000 mL | Freq: Once | INTRAVENOUS | Status: AC
  Administered 2022-07-22: 1000 mL via INTRAVENOUS

## 2022-07-22 NOTE — ED Notes (Signed)
A doctor told the pt that she was being admitted and according to the daughter she   became very anxious reporting that she did not want to stay  trying to removed her wires  message admitting  ativan given

## 2022-07-22 NOTE — H&P (Signed)
Date: 07/22/2022               Patient Name:  Chelsea Fuller MRN: [id number]  DOB: 10-May-1924 Age / Sex: 88 y.o., female   PCP: Nicoletta Dress, MD         Medical Service: Internal Medicine Teaching Service         Attending Physician: Dr. Velna Ochs, MD    First Contact: Dr. Marlene Lard Korinne Greenstein Pager: [phone]  Second Contact: Dr. Rick Duff Pager: [phone]       After Hours (After 5p/  First Contact Pager: [phone]  weekends / holidays): Second Contact Pager: [phone]   Chief Complaint: Slurred Speech   History of Present Illness:   Ms. Raben is a 88 year old woman with PMH permanent atrial fibrillation on Eliquis, CVA without residual defect deficits, coronary artery disease with STEMI in 2017 status post DES x2 to LAD, hypertension, and CKD.  Patient presented to the ED via EMS for slurred speech.  Most history was taken from her daughters who are sitting bedside.  Per daughters, patient was at home and ate breakfast at 11 AM without difficulty, and then was trying to drink coffee around 1130 when the caregiver noticed that coffee history of bread out of her mouth.  After this, patient seemed confused and had trouble following exams.  Patient's daughter notes that the patient's speech was nonsensical.  Patient had a fall 2 weeks ago, however did not hit her head.  X-rays were done of the lower right extremity at the time, however no fracture was appreciated.  Since the fall, patient has required more than normal assistance at home with ambulation and ADLs/IADLs.  She is currently being treated for UTI with antibiotics.  Meds:   Xanax at 5pm 0.5mg  every day Busprione 10mg  TID Coreg 3.125 BID Eliquis 2.5mg  Bid Lasix 20mg  qd Macrobid 100mg  BID starting Friday  Mirtazapine 7.5mg  qhs Seroquel 100mg  qhs Spironolactone 12.5mg  qd Valsartan 160mg  qd Current Meds  Medication Sig   acetaminophen (TYLENOL) 500 MG tablet Take 500 mg by mouth at bedtime as needed for  mild pain.    ALPRAZolam (XANAX) 0.5 MG tablet Take 0.5 mg by mouth daily as needed for anxiety.   busPIRone (BUSPAR) 10 MG tablet Take 10 mg by mouth 3 (three) times daily.   carvedilol (COREG) 3.125 MG tablet TAKE 1 TABLET BY MOUTH 2 TIMES DAILY WITH A MEAL. (Patient taking differently: Take 3.125 mg by mouth 2 (two) times daily with a meal.)   ELIQUIS 2.5 MG TABS tablet TAKE 1 TABLET BY MOUTH TWICE A DAY (Patient taking differently: Take 2.5 mg by mouth 2 (two) times daily.)   furosemide (LASIX) 20 MG tablet Take 1 tablet (20 mg total) by mouth daily.   levothyroxine (SYNTHROID) 75 MCG tablet Take 75 mcg by mouth daily before breakfast.   Magnesium 200 MG TABS Take 200 mg by mouth daily.   Multiple Vitamins-Minerals (OCUVITE ADULT 50+) CAPS Take 1 capsule by mouth daily.   nitrofurantoin, macrocrystal-monohydrate, (MACROBID) 100 MG capsule Take 100 mg by mouth 2 (two) times daily. X 7 days   nitroGLYCERIN (NITROSTAT) 0.4 MG SL tablet Place 0.4 mg under the tongue every 5 (five) minutes as needed for chest pain.    Propylene Glycol (SYSTANE BALANCE) 0.6 % SOLN Place 1 drop into both eyes daily as needed (dry eyes).   QUEtiapine (SEROQUEL) 100 MG tablet Take 100 mg by mouth at bedtime.   spironolactone (ALDACTONE) 25  MG tablet Take 0.5 tablets (12.5 mg total) by mouth every Monday, Wednesday, and Friday.   valsartan (DIOVAN) 160 MG tablet Take 160 mg by mouth daily.     Allergies: Allergies as of 07/22/2022   (No Known Allergies)   Past Medical History:  Diagnosis Date   CAD (coronary artery disease)    a. 07/2016: anterior STEMI s/p DES x2 to LAD   CKD (chronic kidney disease), stage III (HCC)    CVA (cerebral infarction) 2010   Dyspnea    Fall    a. 2014: nasal bone fx.   Hypertension    Hypothyroidism    Ischemic cardiomyopathy    a. EF 25% by cath 08/19/16 then echo showed EF 50-55% 08/20/16.   Mitral regurgitation    a. mild by echo 07/2016.   Mitral stenosis    a. mild by  echo 07/2016.   Permanent atrial fibrillation ([place])    a. not presently on anticoagulation due to STEMI 07/2016 and risk of triple therapy with DAPT + anticoag.   Tricuspid regurgitation    a. mod by echo 07/2016.    Family History: No relevant family history  Social History:  Patient lives in her own apartment, with 24-hour care as per her daughter's.  She needs help with dressing, using the restroom, but at baseline is able to feed herself and ambulate without any help.  She has a remote history of smoking cigarettes, last time was in 1959.  Review of Systems: A complete ROS was negative except as per HPI.   Physical Exam: Blood pressure (!) 146/135, pulse (!) 36, resp. rate 19, SpO2 94 %.  Constitutional: Elderly anxious woman, in no acute distress Cardiovascular: Normal rate, rhythm, no rubs murmurs or gallops appreciated Respiratory: Lungs clear to auscultation Neurological.  AAO x2, patient can tell me her name and knows that she is at a hospital.  Facial asymmetry I on the right side appreciated.  5 out of 5 strength in upper and lower extremities, sensation intact throughout.    Assessment & Plan by Problem: Principal Problem:   Slurred speech  #Stroke - Like Symptoms Stroke versus infectious work-up is underway.  She has multiple risk factors for stroke, including permanent A-fib and also hypertension.  Patient is still confused, and per daughter's she is far away from her baseline at the moment.  She is also presenting with mild right mouth droop.  CT head was done without any anterior cranial abnormalities.  CTA head and neck was done which showed atherosclerotic plaque, intra clear cranial atherosclerotic disease with severe stenosis in the right PCA.  Of note, the patient is recently being treated for a UTI, she is also had multiple episodes of diarrhea. BMP showed glucose capillary at 190, and CBC revealed no leukocytosis.  UA was completed in the emergency department,  which was negative.  MRI brain was performed, which showed no evidence of acute intracranial abnormality, parenchymal atrophy and chronic small vessel ischemic disease and chronic infarcts. Per daughters, patient has had PO intake with food and fluids for the last few years.   Plan: -MRI, CT, CTA revealed no sign of infarct, making acute CVA highly unlikely.  Currently on sure about strokelike symptoms and etiology, however - Strokelike symptoms could be in the setting of dehydration in an elderly patient as patient had multiple episodes of diarrhea, and has had decreased functional ability within the last 2 weeks.  Creatinine has bumped up from baseline of 1.2 to 1.8.  We will continue to work-up stroke symptoms. - Continue Eliquis - EEG results pending - PT/OT consult - SLP evaluation bedside - Urine and blood culture in process  #AKI on CKD  Patient's baseline creatinine seems to be around 1.2, however on admission creatinine level was 1.8.  Likely in the setting of dehydration after multiple episodes of diarrhea and decreased intake over the last 2 weeks. Unsure if this is progression of CKD, or AKI. Because last creatinine measurement was in 2022. GFR is currently 27.  Plan: - Daily BMPs - One time saline bolus 1031mL  #Permanent A-fib Patient has a history of persistent A-fib, however has been asymptomatic from it.  She was rate controlled with Coreg 3.125, and anticoagulated with Eliquis 5 mg.  Plan: - Continue Eliquis 5 mg - Hold Coreg in the setting of bradycardia (last charted pulse was 36, however on encounter pulse was ranging from 55-60) - Cardiac telemetry  #Hypertension Patient has a history of hypertension, was previously taking spironolactone 12.5 mg daily, and valsartan 160mg , however will hold in the setting of AKI.  Plan:  - Await recheck BP and consider adding hydralazine or coreg to patient's regimen.   #Coronary artery disease status post PCI x2 Patient was  taken off dual antiplatelet therapy in 2018 however was unable to find explanation why, daughters are not sure either. Pt has been asymptomatic, and denies any chest pain and shortness of breath.   #Sundowning Per daughters, patient starts to sundown around 5 PM, which is when she takes her Xanax and Seroquel.  Plan: - Xanax .25mg , and seroquel PRN for agitation      Dispo: Admit patient to Observation with expected length of stay less than 2 midnights.  SignedDrucie Opitz, MD 07/22/2022, 4:47 PM  [phone] After 5pm on weekdays and 1pm on weekends: On Call pager: [phone]

## 2022-07-22 NOTE — Consult Note (Signed)
Neurology Consultation  Reason for Consult: Code Stroke Referring Physician: Dr. Tyrone Nine  CC: Confusion, subtle right mouth droop  History is obtained from: EMS, Chart review, patient's daughter via telephone  HPI: Chelsea Fuller is a 88 y.o. female with a medical history significant for permanent atrial fibrillation on Eliquis, CVA without residual deficits, CAD with STEMI in 2017 s/p DES x 2 to LAD, hypertension, and chronic kidney disease stage III who presented to the ED on 10//23 via EMS for evaluation of slurred speech.  Patient lives at home with her daughter and woke up in her usual state of health.  She ate breakfast at 11:00 this morning without difficulty and was trying to drink coffee at 11:30 when her daughter and caregiver noticed that coffee had dripped out of her right mouth.  Following this event, patient was having trouble with following commands and seemed to be confused.  Patient's daughter notes that she seemed to be having trouble comprehending commands.  When she was being assisted out of the bathroom, patient's daughter notes that Chelsea Fuller's speech was nonsensical and the only word that she could make out was "Jesus" within a stream of words.  There was one incident of questionable moving the left side and not the right side but upon further interview, patient's daughter states that she was not moving either side and appeared to be confused with trouble comprehending commands rather than truly weak.  At baseline, the patient uses a roller tour for ambulation and requires assistance.  The patient's daughters state that for approximately 2 weeks the patient has needed increased assistance at home with ambulation and activities of daily living.  She had a fall approximately 2 weeks ago and since then has continued to require increased supervision.  Of note, the patient has had 2 episodes of diarrhea this morning and is being treated for a urinary tract infection with antibiotics  starting Friday night 08/19/2022.   LKW: 11:30 TNK given?: no, patient is on Eliquis at home, last dose this morning IR Thrombectomy? No, imaging reviewed by attending neurologist, neuroradiologist without evidence of LVO on vessel imaging. Modified Rankin Scale: 4-Needs assistance to walk and tend to bodily needs uses a rollator at baseline  ROS: A complete ROS was performed and is negative except as noted in the HPI.   Past Medical History:  Diagnosis Date   CAD (coronary artery disease)    a. 07/2016: anterior STEMI s/p DES x2 to LAD   CKD (chronic kidney disease), stage III (HCC)    CVA (cerebral infarction) 2010   Dyspnea    Fall    a. 2014: nasal bone fx.   Hypertension    Hypothyroidism    Ischemic cardiomyopathy    a. EF 25% by cath 08/19/16 then echo showed EF 50-55% 08/20/16.   Mitral regurgitation    a. mild by echo 07/2016.   Mitral stenosis    a. mild by echo 07/2016.   Permanent atrial fibrillation ([place])    a. not presently on anticoagulation due to STEMI 07/2016 and risk of triple therapy with DAPT + anticoag.   Tricuspid regurgitation    a. mod by echo 07/2016.   Past Surgical History:  Procedure Laterality Date   CARDIAC CATHETERIZATION N/A 08/19/2016   Procedure: Left Heart Cath and Coronary Angiography;  Surgeon: Belva Crome, MD;  LM 30%, pLAD 50%, dLAD 100% s/p  Synergy 2.5 x 24 mm DES, RCA 20%   CARDIAC CATHETERIZATION N/A 08/19/2016   Procedure:  Coronary Stent Intervention;  Surgeon: Belva Crome, MD;  SYNERGY DES 2.5X24 drug eluting stent   CHOLECYSTECTOMY     Family History  Problem Relation Age of Onset   Diabetes Mellitus II Son    Social History:   reports that she has never smoked. She has never used smokeless tobacco. She reports that she does not drink alcohol and does not use drugs.  Medications  Current Facility-Administered Medications:    sodium chloride flush (NS) 0.9 % injection 3 mL, 3 mL, Intravenous, Once, Deno Etienne,  DO  Current Outpatient Medications:    acetaminophen (TYLENOL) 500 MG tablet, Take 500 mg by mouth at bedtime as needed for mild pain. , Disp: , Rfl:    Brimonidine Tartrate (ALPHAGAN P OP), Apply to eye 2 (two) times daily., Disp: , Rfl:    busPIRone (BUSPAR) 10 MG tablet, Take 20 mg by mouth 3 (three) times daily., Disp: , Rfl:    carvedilol (COREG) 3.125 MG tablet, TAKE 1 TABLET BY MOUTH 2 TIMES DAILY WITH A MEAL., Disp: 180 tablet, Rfl: 3   ELIQUIS 2.5 MG TABS tablet, TAKE 1 TABLET BY MOUTH TWICE A DAY, Disp: 180 tablet, Rfl: 1   furosemide (LASIX) 20 MG tablet, Take 1 tablet (20 mg total) by mouth daily., Disp: 90 tablet, Rfl: 3   levothyroxine (SYNTHROID) 75 MCG tablet, Take 75 mcg by mouth daily before breakfast., Disp: , Rfl:    loteprednol (LOTEMAX) 0.2 % SUSP, Place 1 drop into both eyes 2 (two) times daily. (Patient not taking: Reported on 04/26/2021), Disp: , Rfl:    nitroGLYCERIN (NITROSTAT) 0.4 MG SL tablet, Place 0.4 mg under the tongue every 5 (five) minutes as needed for chest pain. , Disp: , Rfl:    spironolactone (ALDACTONE) 25 MG tablet, Take 0.5 tablets (12.5 mg total) by mouth every Monday, Wednesday, and Friday., Disp: 20 tablet, Rfl: 3   valsartan (DIOVAN) 40 MG tablet, Take 1 tablet (40 mg total) by mouth daily. (Patient not taking: Reported on 04/26/2021), Disp: 90 tablet, Rfl: 3   valsartan (DIOVAN) 80 MG tablet, Take 80 mg by mouth daily., Disp: , Rfl:   Exam: Current vital signs: BP (!) 191/158   Pulse 98   Resp 18   SpO2 93%  Vital signs in last 24 hours:   GENERAL: Awake, alert, in no acute distress Psych: Affect appropriate for situation, patient is calm and cooperative with examination.  He does appear to be confused. Head: Normocephalic and atraumatic, without obvious abnormality EENT: Normal conjunctivae, dry mucous membranes, no OP obstruction.  Hard of hearing. LUNGS: Normal respiratory effort. Non-labored breathing on room air CV: Irregular rate and  rhythm on telemetry ABDOMEN: Soft, non-tender, non-distended Extremities: Warm, well perfused, without obvious deformity  NEURO:  Mental Status: Awake, alert, and oriented to self.  She incorrectly states that she is 88 years old.  When asked the month she starts to name the months of the year in order but is unable to state the current month.  Patient does seem to be confused.  She requires constant coaching for following of commands.  Patient is hard of hearing. (Patient is not oriented to time and often situation at baseline).  Patient has some intermittent trouble with word finding with mild aphasia on exam. No neglect is noted Cranial Nerves:  II: PERRL. Visual fields full.  III, IV, VI: EOMI, will track examiner throughout. V: Sensation is intact to light touch and symmetrical to face.  VII: Mild right  mouth droop VIII: Hearing is intact to voice IX, X: Palate elevation is symmetric. Phonation normal.  XI: Normal sternocleidomastoid and trapezius muscle strength XII: Tongue protrudes midline without fasciculations.   Motor: 5/5 strength is all muscle groups without vertical drift or asymmetry. Tone is normal. Bulk is normal.  Sensation: Intact to light touch bilaterally in all four extremities. No extinction to DSS present.  Coordination: Patient does have some difficulty in assessing coordination with trouble following two-step commands.  No overt ataxia noted Gait: Deferred  NIHSS: 1a Level of Conscious.: 0 1b LOC Questions: 2 1c LOC Commands: 0 2 Best Gaze: 0 3 Visual: 0 4 Facial Palsy: 1 5a Motor Arm - left: 0 5b Motor Arm - Right: 0 6a Motor Leg - Left: 0 6b Motor Leg - Right: 0 7 Limb Ataxia: 0 8 Sensory: 0 9 Best Language: 1 10 Dysarthria: 0 11 Extinct. and Inatten.: 0 TOTAL: 4  Labs I have reviewed labs in epic and the results pertinent to this consultation are: CBC    Component Value Date/Time   WBC 4.4 07/22/2022 1332   RBC 4.07 07/22/2022 1332   HGB  12.9 07/22/2022 1333   HGB 12.3 01/27/2018 1141   HCT 38.0 07/22/2022 1333   HCT 36.2 01/27/2018 1141   PLT 144 (L) 07/22/2022 1332   PLT 164 01/27/2018 1141   MCV 98.3 07/22/2022 1332   MCV 91 01/27/2018 1141   MCH 31.4 07/22/2022 1332   MCHC 32.0 07/22/2022 1332   RDW 12.6 07/22/2022 1332   RDW 13.5 01/27/2018 1141   LYMPHSABS 1.3 07/22/2022 1332   MONOABS 0.7 07/22/2022 1332   EOSABS 0.2 07/22/2022 1332   BASOSABS 0.1 07/22/2022 1332   CMP     Component Value Date/Time   NA 140 07/22/2022 1333   NA 138 08/30/2021 1110   K 4.3 07/22/2022 1333   CL 101 07/22/2022 1333   CO2 32 07/22/2022 1332   GLUCOSE 205 (H) 07/22/2022 1333   BUN 24 (H) 07/22/2022 1333   BUN 18 08/30/2021 1110   CREATININE 1.80 (H) 07/22/2022 1333   CREATININE 1.47 (H) 10/05/2016 1013   CALCIUM 8.4 (L) 07/22/2022 1332   PROT 6.6 07/22/2022 1332   PROT 6.9 01/27/2018 1141   ALBUMIN 3.3 (L) 07/22/2022 1332   ALBUMIN 4.3 01/27/2018 1141   AST 22 07/22/2022 1332   ALT 9 07/22/2022 1332   ALKPHOS 60 07/22/2022 1332   BILITOT 0.9 07/22/2022 1332   BILITOT 0.4 01/27/2018 1141   GFRNONAA 27 (L) 07/22/2022 1332   GFRAA 37 (L) 09/30/2018 0209   Lipid Panel     Component Value Date/Time   CHOL 187 08/19/2016 0328   TRIG 52 08/19/2016 0328   HDL 64 08/19/2016 0328   CHOLHDL 2.9 08/19/2016 0328   VLDL 10 08/19/2016 0328   LDLCALC 113 (H) 08/19/2016 0328   Lab Results  Component Value Date   HGBA1C 6.6 (H) 08/19/2016   Imaging I have reviewed the images obtained:  CT-scan of the brain 10/1: No evidence of acute intracranial abnormality. Parenchymal atrophy, chronic small vessel disease and chronic infarcts as described. Paranasal sinus disease, as outlined. 18 mm polypoid soft tissue focus within the left nasal passage. Direct visualization recommended.  CTA head and neck w/wo 10/1: CTA neck:   1. The common carotid, internal carotid and vertebral arteries are patent within the neck without  hemodynamically significant stenosis. Atherosclerotic plaque, as described. 2.  Aortic Atherosclerosis (ICD10-I70.0).   CTA head:  1. No intracranial large vessel occlusion is identified. 2. Intracranial atherosclerotic disease, most notably as follows. 3. Sites of moderate stenosis within multiple proximal M2 left MCA vessels. 4. Severe stenosis within the right PCA at the P2/P3 junction.  Impression: 88 year old female with a medical history significant for AF on Eliquis, CVA initially with right-sided deficits that were resolved with rehab per family, CAD with STEMI in 2017/P DES x2 to LAD, HTN, and CKD stage III who presented to the ED on 10/1 for evaluation of acute confusion and subtle right mouth droop.  - Examination reveals patient who is confused, subtle right mouth droop, and mild aphasia. - CT head imaging is without acute intercranial abnormality.  Vascular imaging completed with atherosclerotic plaque, intracranial atherosclerotic disease with severe stenosis in the right PCA. -Patient is not a candidate for TNK as she is on full dose anticoagulation at home on Eliquis.  Last dose this morning. - Patient's encephalopathy etiology is unclear at this time though may be related to recent infection, dehydration with multiple episodes of diarrhea, delirium.  Will evaluate for seizure with routine EEG.  Encephalopathy labs pending.    Recommendations: - MRI brain wo contrast when able to obtain  - Routine EEG - Infectious work up per EDP / primary team - UA + culture, though this may be obscured with recent antibiotic treatment starting 9/29 PM for UTI - Okay for permissive hypertension until MRI results. Gradual normotension if MRI is negative for acute ischemia.  Pt seen by NP/Neuro and later by MD. Note/plan to be edited by MD as needed.  Anibal Henderson, AGAC-NP [facility] Pager: [phone]   NEUROHOSPITALIST ADDENDUM Performed a face to face diagnostic  evaluation.   I have reviewed the contents of history and physical exam as documented by PA/ARNP/Resident and agree with above documentation.  I have discussed and formulated the above plan as documented. Edits to the note have been made as needed.  Impression/Key exam findings/Plan: 19F with afibb on eliquis, prior L MCA infarct p/w fluid dripping from R mouth and confusion vs aphasia. Recommend rEEG and MRI Brain and if above negative, no further inpatient neuro workup.  Donnetta Simpers, MD [facility] [phone]   If 7pm to 7am, please call on call as listed on AMION.

## 2022-07-22 NOTE — ED Provider Notes (Signed)
[facility] EMERGENCY DEPARTMENT Provider Note   CSN: [id number] Arrival date & time: 07/22/22  1327     History  Chief Complaint  Patient presents with   Code Stroke    Chelsea Fuller is a 88 y.o. female.  88 year old female brought in by EMS from home with concern for stroke with a last known well of 11:15 AM today.  Per family at bedside, patient went to bed early last night around 6 PM, slept in later today and woke around 11 AM where she was seen by her home health aide who noted her to be normal at that time and able to take her medications at 11:15 AM without difficulty.  At 11:30 AM, patient was given something to drink with a honey bun and had difficulty keeping the liquid in her mouth.  The aide initially exchanged the cup for another cup when patient continued to spill liquid out of her mouth, became concerned for stroke and called EMS.  Past medical history of hypertension, CVA, CAD, CKD, permanent A-fib, on Eliquis.  Patient is alert to name, is able to tell me where she lives and that she is at the hospital otherwise has difficulty formulating her words.       Home Medications Prior to Admission medications   Medication Sig Start Date End Date Taking? Authorizing Provider  acetaminophen (TYLENOL) 500 MG tablet Take 500 mg by mouth at bedtime as needed for mild pain.    Yes [provider]  ALPRAZolam Duanne Moron) 0.5 MG tablet Take 0.5 mg by mouth daily as needed for anxiety. 07/05/22  Yes [provider]  busPIRone (BUSPAR) 10 MG tablet Take 10 mg by mouth 3 (three) times daily.   Yes [provider]  carvedilol (COREG) 3.125 MG tablet TAKE 1 TABLET BY MOUTH 2 TIMES DAILY WITH A MEAL. Patient taking differently: Take 3.125 mg by mouth 2 (two) times daily with a meal. 08/01/20  Yes Kathyrn Drown D, NP  ELIQUIS 2.5 MG TABS tablet TAKE 1 TABLET BY MOUTH TWICE A DAY Patient taking differently: Take 2.5 mg by mouth 2 (two) times  daily. 06/27/21  Yes Belva Crome, MD  furosemide (LASIX) 20 MG tablet Take 1 tablet (20 mg total) by mouth daily. 03/26/18  Yes Belva Crome, MD  levothyroxine (SYNTHROID) 75 MCG tablet Take 75 mcg by mouth daily before breakfast.   Yes [provider]  Magnesium 200 MG TABS Take 200 mg by mouth daily.   Yes [provider]  Multiple Vitamins-Minerals (OCUVITE ADULT 50+) CAPS Take 1 capsule by mouth daily.   Yes [provider]  nitrofurantoin, macrocrystal-monohydrate, (MACROBID) 100 MG capsule Take 100 mg by mouth 2 (two) times daily. X 7 days 07/20/22  Yes [provider]  nitroGLYCERIN (NITROSTAT) 0.4 MG SL tablet Place 0.4 mg under the tongue every 5 (five) minutes as needed for chest pain.  07/29/16  Yes [provider]  Propylene Glycol (SYSTANE BALANCE) 0.6 % SOLN Place 1 drop into both eyes daily as needed (dry eyes).   Yes [provider]  QUEtiapine (SEROQUEL) 100 MG tablet Take 100 mg by mouth at bedtime. 07/09/22  Yes [provider]  spironolactone (ALDACTONE) 25 MG tablet Take 0.5 tablets (12.5 mg total) by mouth every Monday, Wednesday, and Friday. 09/04/21  Yes Belva Crome, MD  valsartan (DIOVAN) 160 MG tablet Take 160 mg by mouth daily.   Yes [provider]  Allergies    Patient has no known allergies.    Review of Systems   Review of Systems Level 5 caveat for change in mental status Physical Exam Updated Vital Signs BP (!) 191/158   Pulse 98   Resp 18   SpO2 93%  Physical Exam Vitals and nursing note reviewed.  Constitutional:      General: She is not in acute distress.    Appearance: She is well-developed. She is not diaphoretic.  HENT:     Head: Normocephalic and atraumatic.     Mouth/Throat:     Mouth: Mucous membranes are moist.  Eyes:     Extraocular Movements: Extraocular movements intact.     Pupils: Pupils are equal, round, and reactive to light.  Cardiovascular:     Rate  and Rhythm: Normal rate and regular rhythm.  Pulmonary:     Effort: Pulmonary effort is normal.     Breath sounds: Normal breath sounds.  Abdominal:     Palpations: Abdomen is soft.     Tenderness: There is no abdominal tenderness.  Musculoskeletal:     Right lower leg: No edema.     Left lower leg: No edema.  Skin:    General: Skin is dry.     Findings: No erythema or rash.  Neurological:     Mental Status: She is alert.     GCS: GCS eye subscore is 4. GCS verbal subscore is 4. GCS motor subscore is 6.     Cranial Nerves: Facial asymmetry present.     Sensory: No sensory deficit.     Motor: No weakness.  Psychiatric:        Behavior: Behavior normal.     ED Results / Procedures / Treatments   Labs (all labs ordered are listed, but only abnormal results are displayed) Labs Reviewed  PROTIME-INR - Abnormal; Notable for the following components:      Result Value   Prothrombin Time 16.2 (*)    INR 1.3 (*)    All other components within normal limits  CBC - Abnormal; Notable for the following components:   Platelets 144 (*)    All other components within normal limits  COMPREHENSIVE METABOLIC PANEL - Abnormal; Notable for the following components:   Glucose, Bld 208 (*)    Creatinine, Ser 1.71 (*)    Calcium 8.4 (*)    Albumin 3.3 (*)    GFR, Estimated 27 (*)    Anion gap 4 (*)    All other components within normal limits  URINALYSIS, ROUTINE W REFLEX MICROSCOPIC - Abnormal; Notable for the following components:   Leukocytes,Ua MODERATE (*)    All other components within normal limits  I-STAT CHEM 8, ED - Abnormal; Notable for the following components:   BUN 24 (*)    Creatinine, Ser 1.80 (*)    Glucose, Bld 205 (*)    Calcium, Ion 1.04 (*)    All other components within normal limits  CBG MONITORING, ED - Abnormal; Notable for the following components:   Glucose-Capillary 190 (*)    All other components within normal limits  CULTURE, BLOOD (ROUTINE X 2)   CULTURE, BLOOD (ROUTINE X 2)  APTT  DIFFERENTIAL  ETHANOL  LACTIC ACID, PLASMA  LACTIC ACID, PLASMA    EKG None  Radiology CT ANGIO HEAD NECK W WO CM (CODE STROKE)  Result Date: 07/22/2022 CLINICAL DATA:  Neuro deficit, acute, stroke suspected. EXAM: CT ANGIOGRAPHY HEAD AND NECK TECHNIQUE: Multidetector CT imaging of the  head and neck was performed using the standard protocol during bolus administration of intravenous contrast. Multiplanar CT image reconstructions and MIPs were obtained to evaluate the vascular anatomy. Carotid stenosis measurements (when applicable) are obtained utilizing NASCET criteria, using the distal internal carotid diameter as the denominator. RADIATION DOSE REDUCTION: This exam was performed according to the departmental dose-optimization program which includes automated exposure control, adjustment of the mA and/or kV according to patient size and/or use of iterative reconstruction technique. CONTRAST:  Administered contrast not known at this time. COMPARISON:  Noncontrast head CT performed earlier today. FINDINGS: CTA NECK FINDINGS Aortic arch: Common origin of the innominate and left common carotid arteries. Atherosclerotic plaque within the visualized aortic arch and proximal major branch vessels of the neck. No hemodynamically significant innominate or proximal subclavian artery stenosis. Right carotid system: CCA and ICA patent within the neck without hemodynamically significant stenosis (50% or greater). Atherosclerotic plaque about the carotid bifurcation and within the proximal ICA. Left carotid system: CCA and ICA patent within the neck without hemodynamically significant stenosis (50% or greater). Atherosclerotic plaque about the carotid bifurcation and within the proximal ICA. Vertebral arteries: Vertebral arteries patent within the neck. Nonstenotic atherosclerotic plaque within both vessels. The left vertebral artery is slightly dominant. Skeleton: Cervical  spondylosis. No acute fracture or aggressive osseous lesion. Other neck: No neck mass or cervical lymphadenopathy. Upper chest: No consolidation within the imaged lung apices. Review of the MIP images confirms the above findings CTA HEAD FINDINGS Anterior circulation: The intracranial internal carotid arteries are patent. Atherosclerotic plaque within both vessels with no more than mild stenosis. The M1 middle cerebral arteries are patent. No M2 proximal branch occlusion is identified. Atherosclerotic irregularity of the M2 and more distal MCA vessels, bilaterally. Most notably, there are sites of moderate stenosis within multiple proximal M2 left MCA vessels (for instance as seen on series 12, image 27). The anterior cerebral arteries are patent. No intracranial aneurysm is identified. Posterior circulation: The intracranial vertebral arteries are patent. Atherosclerotic plaque within the dominant intracranial left vertebral artery with no more than mild stenosis. Nonstenotic atherosclerotic plaque within the non dominant intracranial right vertebral artery. The basilar artery is patent. The posterior cerebral arteries are patent. Atherosclerotic irregularity of both vessels. Most notably, there is a severe stenosis within the right PCA at the P2/P3 junction. The right PCA is fetal in origin. A left posterior communicating artery is present. Venous sinuses: Within the limitations of contrast timing, no convincing thrombus. Anatomic variants: As described. Review of the MIP images confirms the above findings No emergent large vessel occlusion identified. These results were communicated to Dr. Lorrin Goodell At 2:00 pmon 10/1/2023by text page via the [facility] messaging system. IMPRESSION: CTA neck: 1. The common carotid, internal carotid and vertebral arteries are patent within the neck without hemodynamically significant stenosis. Atherosclerotic plaque, as described. 2.  Aortic Atherosclerosis (ICD10-I70.0). CTA head: 1.  No intracranial large vessel occlusion is identified. 2. Intracranial atherosclerotic disease, most notably as follows. 3. Sites of moderate stenosis within multiple proximal M2 left MCA vessels. 4. Severe stenosis within the right PCA at the P2/P3 junction. Electronically Signed   By: Kellie Simmering D.O.   On: 07/22/2022 14:00   CT HEAD CODE STROKE WO CONTRAST  Result Date: 07/22/2022 CLINICAL DATA:  Code stroke. Neuro deficit, acute, stroke suspected. EXAM: CT HEAD WITHOUT CONTRAST TECHNIQUE: Contiguous axial images were obtained from the base of the skull through the vertex without intravenous contrast. RADIATION DOSE REDUCTION: This exam was performed  according to the departmental dose-optimization program which includes automated exposure control, adjustment of the mA and/or kV according to patient size and/or use of iterative reconstruction technique. COMPARISON:  Report from prior head CT 10/04/2013 (images unavailable). Head CT 11/18/2008. FINDINGS: Brain: Moderate cerebral atrophy. Chronic left MCA vascular territory cortical/subcortical infarcts within the left frontal lobe, left insula and left parietooccipital lobes. Background moderate patchy and ill-defined hypoattenuation within the cerebral white matter, nonspecific but compatible chronic small vessel ischemic disease. Chronic appearing lacunar infarcts within the right deep gray nuclei and left thalamus. There is no acute intracranial hemorrhage. No acute demarcated cortical infarct. No extra-axial fluid collection. No evidence of an intracranial mass. No midline shift. Vascular: No hyperdense vessel.  Atherosclerotic calcifications. Skull: No fracture or aggressive osseous lesion. Sinuses/Orbits: No mass or acute finding within the imaged orbits. Complete opacification of the left sphenoid sinus with associated chronic reactive osteitis. Extensive partial opacification of the left maxillary sinus. 18 mm polypoid soft tissue focus within the left  nasal passage ASPECTS [facility] Stroke Program Early CT Score) - Ganglionic level infarction (caudate, lentiform nuclei, internal capsule, insula, M1-M3 cortex): 7 - Supraganglionic infarction (M4-M6 cortex): 3 Total score (0-10 with 10 being normal): 10 (when discounting chronic infarcts). No evidence of acute intracranial abnormality. These results were communicated to Dr. Lorrin Goodell at 1:45 pmon 10/1/2023by text page via the [facility] messaging system. IMPRESSION: No evidence of acute intracranial abnormality. Parenchymal atrophy, chronic small vessel disease and chronic infarcts as described. Paranasal sinus disease, as outlined. 18 mm polypoid soft tissue focus within the left nasal passage. Direct visualization recommended. Electronically Signed   By: Kellie Simmering D.O.   On: 07/22/2022 13:48    Procedures .Critical Care  Performed by: Tacy Learn, PA-C Authorized by: Tacy Learn, PA-C   Critical care provider statement:    Critical care time (minutes):  30   Critical care was time spent personally by me on the following activities:  Development of treatment plan with patient or surrogate, discussions with consultants, evaluation of patient's response to treatment, examination of patient, ordering and review of laboratory studies, ordering and review of radiographic studies, ordering and performing treatments and interventions, pulse oximetry, re-evaluation of patient's condition and review of old charts     Medications Ordered in ED Medications  sodium chloride flush (NS) 0.9 % injection 3 mL (has no administration in time range)  iohexol (OMNIPAQUE) 350 MG/ML injection 75 mL (75 mLs Intravenous Contrast Given 07/22/22 1354)  labetalol (NORMODYNE) injection 10 mg (10 mg Intravenous Given 07/22/22 1457)  LORazepam (ATIVAN) injection 0.5 mg (0.5 mg Intravenous Given 07/22/22 1501)    ED Course/ Medical Decision Making/ A&P                           Medical Decision Making Amount and/or  Complexity of Data Reviewed Labs: ordered. Radiology: ordered.   This patient presents to the ED for concern of change in speech, facial weakness, this involves an extensive number of treatment options, and is a complaint that carries with it a high risk of complications and morbidity.  The differential diagnosis includes but not limited to stroke, brain mass, encephalopathy   Co morbidities that complicate the patient evaluation  A-fib, CAD, CKD, CVA, hypertension   Additional history obtained:  Additional history obtained from family at bedside who assist with history as above External records from outside source obtained and reviewed including visit to cardiology dated 04/26/2021 with  reported history of hypertension, chronic diastolic heart failure (initial EF of 25%, had improved to greater than 50%), CKD, CVA, CAD with STEMI status post DES x2 to LAD in 2017   Lab Tests:  I Ordered, and personally interpreted labs.  The pertinent results include: CMP with gradual uptrend in creatinine, currently 1.71.  CBC with normal white count.  CBG elevated at 190.   Imaging Studies ordered:  I ordered imaging studies including CT head, CTA head and neck I independently visualized and interpreted imaging which showed no acute intracranial abnormality I agree with the radiologist interpretation   Cardiac Monitoring: / EKG:  The patient was maintained on a cardiac monitor.  I personally viewed and interpreted the cardiac monitored which showed an underlying rhythm of: a fib rate 98   Consultations Obtained:  I requested consultation with the neurohospitalist, Dr. Lorrin Goodell,  and discussed lab and imaging findings as well as pertinent plan - they recommend: MRI, consider possible UTI, permissive HTN   Problem List / ED Course / Critical interventions / Medication management  88 year old female brought in by EMS from home with concern for stroke due to sudden onset aphasia and facial  weakness, unable to drink from her complex she had been previously 15 minutes earlier.  Patient arrived emergency room as a code stroke, was greeted by the neurology team and taken to CT.  Neurology recommends MRI after negative head CT, also suggest further work-up for her change in mental status to include a urinalysis which has been ordered and is pending.  Patient is oriented to person, place and her family but gives confused responses otherwise.  No unilateral weakness noted other than question slight facial asymmetry although may be baseline due to prior CVA. Discussed with hospitalist service who will consult for admission.  Labs including lactic acid, blood cultures, urinalysis pending at time of admission. I ordered medication including labetalol for hypertension Reevaluation of the patient after these medicines showed that the patient improved I have reviewed the patients home medicines and have made adjustments as needed   Social Determinants of Health:  Lives independently with care provided from caretakers and family   Test / Admission - Considered:  Admit for further work up and monitoring.          Final Clinical Impression(s) / ED Diagnoses Final diagnoses:  Aphasia  Elevated blood pressure reading    Rx / DC Orders ED Discharge Orders     None         Tacy Learn, PA-C 07/22/22 [place], DO 07/22/22 1527

## 2022-07-22 NOTE — ED Notes (Signed)
Pt noted to show increased signs of agitation, attempting to get out of bed and pulling off monitoring equipment stating "I need to go where those helpers are". This RN at bedside to help pt get situated more comfortably in the bed. Safety mitts applied and posey bed exit alarm applied to stretcher. This RN will continue to monitor for need of further intervention.

## 2022-07-22 NOTE — ED Notes (Signed)
Pt remains agitated, continually trying to get off the stretcher stating "that lady told me I could get up" although no one appears to be present. Pt redirected several times and educated about safety mitts however interminably attempts to remove mitts.

## 2022-07-22 NOTE — ED Notes (Signed)
The pt just returned from mri  the admitting team is at the bedside

## 2022-07-22 NOTE — ED Triage Notes (Signed)
Pt bib [facility] ems. Caregiver stated she was having some slurred speech and confusion. Pt has some aphasia and word salad. Blood sugar 190 bp 182/122. Family states current uti. Patient takes eloquis.

## 2022-07-22 NOTE — ED Notes (Signed)
The pt is not oriented and is not ablt to follow some commands  I dont think now is a good time to attempt the swallow screen  maybe a little later  npo

## 2022-07-22 NOTE — ED Notes (Signed)
Admitting team doing their exam

## 2022-07-22 NOTE — Hospital Course (Addendum)
10/6 She is feeling better today. Was able to sleep well last night. Knows that she is in a hospital. Agreeable to go home today.   #Multifactorial Toxic Metabolic Encephalopathy  #UTI  #Vitamin B12 and Thiamine Inadequacy  #Poor PO Intake Patient presented to the emergency department via EMS for slurred speech.  Per her daughters, patient was at home and ate breakfast at 11 AM without any difficulty but tried to drink, pain daughters noticed that coffee was running down her mouth.  After this patient was confused and had trouble following any commands.  Her daughters also note that she has had poor in p.o. intake for the last few months.  Daughters also state at the time that she had just been diagnosed with a UTI.  On admission she had right facial drooping as well.  Brain MRI was done in the emergency department, which did not reveal any acute abnormality.  EEGs were then done to evaluate for seizures, first EEG showed no abnormality, second EEG was attempted however patient became agitated.  She was subsequently started on ceftriaxone IV, and labs were sent out for B1, B12, folate, RPR assessing reversible causes of altered mental status.  B12 was low normal, so we subsequently replenished her B12, thiamine was also low normal, so we replenished thiamine levels as well.  Patient showed improvement in mental status throughout her hospital course after replenishing of these vitamins, and continuing to encourage patient to take fluids p.o.  We transitioned her to oral augmentin, and she had finished her course and appears to be back to baseline.   #Agitation/Sundowning Patient has a history of sundowning around 5 PM, and her home regimen included Xanax 0.5 mg and Seroquel 100 mg which she will take daily.  Throughout her hospital course she would become agitated at night, and we initially started her on as needed Ativan and her Seroquel dose of 25 mg.  However she will continue to be aggravated, and upon  further chart review it was noted that benzodiazepines were not a chronic medication for her.  She was then switched to Haldol for 1 night, but continued to be agitated throughout the night.  She was then given Seroquel 50 mg scheduled at 6 PM, and also Seroquel 50 mg as needed for agitation which seemed to prevent any agitation episodes.  #Pulmonary edema in the setting of iatrogenic fluid resuscitation on admission Upon examination in the second day of patient stay in the hospital, it was noted she was using her accessory muscles for breathing.  Chest x-ray was then obtained which revealed pulmonary edema.  She was treated with IV Lasix 40 mg, and her work of breathing improved.  #Permanent A-fib Patient is a history of A-fib, rate controlled with Coreg 3.125 and anticoagulated with Eliquis 5 mg.  Pulses have been stable throughout her hospital course.  Continue these medications upon discharge.  #AKI on CKD Patient's creatinine level upon admission was 1.8, however throughout her hospital course creatinine slowly trended down to her baseline of 1.5.  #Hypertension  Patient has a history of hypertension, was previously taking spironolactone 12.5 mg daily and losartan 165 mg however initially patient had an AKI so we were able to hold medications.  Upon attempt to dispo yesterday, patient was noted to be hypertensive with BP's in the 200. We increased her amlodipine to 10mg , and started the spironolactone 12.5 mg, which helped resolve her high blood pressure. Can continue medications in the outpatient setting.  #73mm Polypoid Soft  Tissue in left Nasal passage Incidental findings on CT, would recommend outpatient ENT follow-up.

## 2022-07-22 NOTE — Procedures (Signed)
Routine EEG Report  Chelsea Fuller is a 88 y.o. female with a history of aphasia who is undergoing an EEG to evaluate for seizures.  Report: This EEG was acquired with electrodes placed according to the International 10-20 electrode system (including Fp1, Fp2, F3, F4, C3, C4, P3, P4, O1, O2, T3, T4, T5, T6, A1, A2, Fz, Cz, Pz). The following electrodes were missing or displaced: none.  The occipital dominant rhythm was 8.5 Hz. This activity is reactive to stimulation. Drowsiness was manifested by background fragmentation; deeper stages of sleep were not identified. There was continuous EMG artifact in the bifrontal L>R areas preventing adequate evaluation of those regions. In the posterior regions, no epileptiform abnormalities were noted. Photic stimulation and hyperventilation were not performed.   Impression: This EEG was obtained while awake and drowsy and is normal in the posterior regions. EMG artifact prevents evaluation of the bifrontal L>R regions which includes the area of interest for aphasia. If clinical suspicion for seizures remains please order repeat EEG.    Clinical Correlation: Normal EEGs, however, do not rule out epilepsy.  Su Monks, MD [facility] [phone]  If 7pm- 7am, please page neurology on call as listed in [place].

## 2022-07-22 NOTE — ED Notes (Signed)
Internal medicine contacted and notified of increased agitation and confusion noted with patient. Delirium precautions ordered.

## 2022-07-22 NOTE — ED Notes (Signed)
Eeg person at  the bedside  blood being drawn at present

## 2022-07-22 NOTE — Progress Notes (Signed)
EEG complete - results pending 

## 2022-07-23 ENCOUNTER — Observation Stay (HOSPITAL_COMMUNITY): Payer: Medicare PPO

## 2022-07-23 DIAGNOSIS — N179 Acute kidney failure, unspecified: Secondary | ICD-10-CM

## 2022-07-23 DIAGNOSIS — N390 Urinary tract infection, site not specified: Secondary | ICD-10-CM

## 2022-07-23 DIAGNOSIS — R451 Restlessness and agitation: Secondary | ICD-10-CM

## 2022-07-23 DIAGNOSIS — R4182 Altered mental status, unspecified: Secondary | ICD-10-CM | POA: Diagnosis not present

## 2022-07-23 DIAGNOSIS — G9340 Encephalopathy, unspecified: Secondary | ICD-10-CM

## 2022-07-23 DIAGNOSIS — F05 Delirium due to known physiological condition: Secondary | ICD-10-CM | POA: Diagnosis not present

## 2022-07-23 LAB — URINALYSIS, COMPLETE (UACMP) WITH MICROSCOPIC
Bilirubin Urine: NEGATIVE
Glucose, UA: NEGATIVE mg/dL
Ketones, ur: 20 mg/dL — AB
Leukocytes,Ua: NEGATIVE
Nitrite: NEGATIVE
Protein, ur: 100 mg/dL — AB
Specific Gravity, Urine: 1.026 (ref 1.005–1.030)
pH: 5 (ref 5.0–8.0)

## 2022-07-23 LAB — BASIC METABOLIC PANEL
Anion gap: 11 (ref 5–15)
BUN: 18 mg/dL (ref 8–23)
CO2: 25 mmol/L (ref 22–32)
Calcium: 8.8 mg/dL — ABNORMAL LOW (ref 8.9–10.3)
Chloride: 104 mmol/L (ref 98–111)
Creatinine, Ser: 1.51 mg/dL — ABNORMAL HIGH (ref 0.44–1.00)
GFR, Estimated: 32 mL/min — ABNORMAL LOW (ref >60)
Glucose, Bld: 111 mg/dL — ABNORMAL HIGH (ref 70–99)
Potassium: 4.1 mmol/L (ref 3.5–5.1)
Sodium: 140 mmol/L (ref 135–145)

## 2022-07-23 LAB — GLUCOSE, CAPILLARY: Glucose-Capillary: 98 mg/dL (ref 70–99)

## 2022-07-23 LAB — CBC
HCT: 40.5 % (ref 36.0–46.0)
Hemoglobin: 12.7 g/dL (ref 12.0–15.0)
MCH: 30.8 pg (ref 26.0–34.0)
MCHC: 31.4 g/dL (ref 30.0–36.0)
MCV: 98.3 fL (ref 80.0–100.0)
Platelets: 139 10*3/uL — ABNORMAL LOW (ref 150–400)
RBC: 4.12 MIL/uL (ref 3.87–5.11)
RDW: 12.5 % (ref 11.5–15.5)
WBC: 6.9 10*3/uL (ref 4.0–10.5)
nRBC: 0 % (ref 0.0–0.2)

## 2022-07-23 LAB — URINE CULTURE: Culture: 10000 — AB

## 2022-07-23 LAB — HEMOGLOBIN A1C
Hgb A1c MFr Bld: 5.7 % — ABNORMAL HIGH (ref 4.8–5.6)
Mean Plasma Glucose: 116.89 mg/dL

## 2022-07-23 LAB — TSH: TSH: 0.524 u[IU]/mL (ref 0.350–4.500)

## 2022-07-23 LAB — SEDIMENTATION RATE: Sed Rate: 45 mm/hr — ABNORMAL HIGH (ref 0–22)

## 2022-07-23 LAB — CBG MONITORING, ED: Glucose-Capillary: 93 mg/dL (ref 70–99)

## 2022-07-23 LAB — T4, FREE: Free T4: 1.24 ng/dL — ABNORMAL HIGH (ref 0.61–1.12)

## 2022-07-23 LAB — FOLATE: Folate: 6.2 ng/mL (ref >5.9)

## 2022-07-23 LAB — VITAMIN B12: Vitamin B-12: 206 pg/mL (ref 180–914)

## 2022-07-23 MED ORDER — ALPRAZOLAM 0.5 MG PO TABS
0.5000 mg | ORAL_TABLET | Freq: Every evening | ORAL | Status: DC | PRN
  Filled 2022-07-23: qty 1

## 2022-07-23 MED ORDER — LORAZEPAM 2 MG/ML IJ SOLN
1.0000 mg | Freq: Once | INTRAMUSCULAR | Status: AC
  Administered 2022-07-23: 1 mg via INTRAVENOUS
  Filled 2022-07-23: qty 1

## 2022-07-23 MED ORDER — SODIUM CHLORIDE 0.9 % IV SOLN
1.0000 g | INTRAVENOUS | Status: AC
  Administered 2022-07-23 – 2022-07-25 (×3): 1 g via INTRAVENOUS
  Filled 2022-07-23 (×3): qty 10

## 2022-07-23 MED ORDER — QUETIAPINE FUMARATE 100 MG PO TABS: 100.0000 mg | ORAL_TABLET | ORAL | Status: DC | PRN

## 2022-07-23 MED ORDER — SODIUM CHLORIDE 0.9 % IV BOLUS
1000.0000 mL | Freq: Once | INTRAVENOUS | Status: AC
  Administered 2022-07-23: 1000 mL via INTRAVENOUS

## 2022-07-23 NOTE — Progress Notes (Signed)
Attempted repeat routine EEG.  Patient very confused. Talking and moving around on bed in ED.  Patient not cooperative at moment.  Will attempt later today if patient is more cooperative

## 2022-07-23 NOTE — Procedures (Signed)
Patient Name: Chelsea Fuller  MRN: [id number]  Epilepsy Attending: Lora Havens  Referring Physician/Provider: Greta Doom, MD  Date: 07/23/2022 Duration: 29.52 mins  Patient history: 88yo F h/o CHF, CAD, STEMI, Afib on Eliquis, prior L MCA infarct presents for evaluation of confusion and right sided facial droop.EEG to evaluate for seizure  Level of alertness: Awake  AEDs during EEG study: None  Technical aspects: This EEG study was done with scalp electrodes positioned according to the 10-20 International system of electrode placement. Electrical activity was reviewed with band pass filter of 1-70Hz , sensitivity of 7 uV/mm, display speed of 15mm/sec with a 60Hz  notched filter applied as appropriate. EEG data were recorded continuously and digitally stored.  Video monitoring was available and reviewed as appropriate.  Description: No clear posterior dominant rhythm was seen. EEG showed continuous generalized 3 to 6 Hz theta-delta slowing. Hyperventilation and photic stimulation were not performed.     Of note, study was technically difficult due to significant myogenic and movement artifact.   ABNORMALITY - Continuous slow, generalized  IMPRESSION: This technically difficult study is suggestive of moderate diffuse encephalopathy, nonspecific etiology. No seizures or epileptiform discharges were seen throughout the recording.  Lack of epileptiform activity on interictal EEG does not exclude the diagnosis of epilepsy. If suspicion for interictal activity remains a concern, a prolonged study including sleep can  be considered.   Sevilla Murtagh Barbra Sarks

## 2022-07-23 NOTE — Evaluation (Signed)
Physical Therapy Evaluation Patient Details Name: Chelsea Fuller MRN: [id number] DOB: 1924/04/09 Today's Date: 07/23/2022  History of Present Illness  Pt is a 88 y/o female who presented with slurred speech and AMS. MRI negative for acute infarct. EEG pending. PMH: a fib, CVA, CAD with STEMI, HTN, CKDIII  Clinical Impression  Pt admitted secondary to problem above with deficits below. Pt agitated with bed mobility for linen change and swatting at PT/OT initially. Pt confused throughout and unable to tolerate further mobility. Requiring total A for all mobility. Recommending SNF level therapies at d/c to address mobility deficits. Will continue to follow acutely.      Recommendations for follow up therapy are one component of a multi-disciplinary discharge planning process, led by the attending physician.  Recommendations may be updated based on patient status, additional functional criteria and insurance authorization.  Follow Up Recommendations Skilled nursing-short term rehab (<3 hours/day) Can patient physically be transported by private vehicle: No    Assistance Recommended at Discharge Frequent or constant Supervision/Assistance  Patient can return home with the following  Two people to help with walking and/or transfers;Two people to help with bathing/dressing/bathroom;Assistance with cooking/housework;Help with stairs or ramp for entrance;Assist for transportation;Direct supervision/assist for financial management;Direct supervision/assist for medications management    Equipment Recommendations None recommended by PT  Recommendations for Other Services       Functional Status Assessment Patient has had a recent decline in their functional status and demonstrates the ability to make significant improvements in function in a reasonable and predictable amount of time.     Precautions / Restrictions Precautions Precautions: Fall Restrictions Weight Bearing Restrictions: No       Mobility  Bed Mobility Overal bed mobility: Needs Assistance Bed Mobility: Rolling Rolling: Total assist, +2 for safety/equipment         General bed mobility comments: Total A x 2 for rolling side to side for changing of sheet on stretcher, did swat arms out at therapists initially    Transfers                   General transfer comment: deferred    Ambulation/Gait                  Stairs            Wheelchair Mobility    Modified Rankin (Stroke Patients Only)       Balance                                             Pertinent Vitals/Pain Pain Assessment Pain Assessment: Faces Faces Pain Scale: Hurts little more Pain Location: "toes" Pain Descriptors / Indicators: Grimacing, Guarding Pain Intervention(s): Limited activity within patient's tolerance, Monitored during session, Repositioned    Home Living Family/patient expects to be discharged to:: Unsure                   Additional Comments: per chart, pt lives alone but has family and caregiver support - unsure of home setup, amount of assistance available due to pt poor historian    Prior Function Prior Level of Function : Needs assist;History of Falls (last six months);Patient poor historian/Family not available             Mobility Comments: per chart, uses rollator for mobility. at least one recent fall 2 weeks ago ADLs  Comments: per chart, has a caregiver that assists with ADLs. unsure of how much assist pt needs at baseline     Hand Dominance        Extremity/Trunk Assessment   Upper Extremity Assessment Upper Extremity Assessment: Defer to OT evaluation    Lower Extremity Assessment Lower Extremity Assessment: Difficult to assess due to impaired cognition;Generalized weakness       Communication   Communication: Expressive difficulties;Other (comment) (mumbled, slurred speech)  Cognition Arousal/Alertness: Lethargic, Suspect due to  medications Behavior During Therapy: Agitated, Restless, Anxious Overall Cognitive Status: No family/caregiver present to determine baseline cognitive functioning                                 General Comments: lethargic, mumbled speech throughout. does not follow commands consistently, did swat out at therapists with rolling on stretcher. would not open eyes on command        General Comments General comments (skin integrity, edema, etc.): Mitts in place on entry    Exercises     Assessment/Plan    PT Assessment Patient needs continued PT services  PT Problem List Decreased strength;Decreased range of motion;Decreased activity tolerance;Decreased balance;Decreased mobility;Decreased knowledge of use of DME;Decreased cognition;Decreased safety awareness;Decreased knowledge of precautions       PT Treatment Interventions DME instruction;Gait training;Therapeutic activities;Functional mobility training;Therapeutic exercise;Balance training;Patient/family education    PT Goals (Current goals can be found in the Care Plan section)  Acute Rehab PT Goals PT Goal Formulation: Patient unable to participate in goal setting Time For Goal Achievement: 08/06/22 Potential to Achieve Goals: Fair    Frequency Min 2X/week     Co-evaluation PT/OT/SLP Co-Evaluation/Treatment: Yes Reason for Co-Treatment: Necessary to address cognition/behavior during functional activity;For patient/therapist safety;To address functional/ADL transfers PT goals addressed during session: Mobility/safety with mobility OT goals addressed during session: ADL's and self-care       AM-PAC PT "6 Clicks" Mobility  Outcome Measure Help needed turning from your back to your side while in a flat bed without using bedrails?: Total Help needed moving from lying on your back to sitting on the side of a flat bed without using bedrails?: Total Help needed moving to and from a bed to a chair (including a  wheelchair)?: Total Help needed standing up from a chair using your arms (e.g., wheelchair or bedside chair)?: Total Help needed to walk in hospital room?: Total Help needed climbing 3-5 steps with a railing? : Total 6 Click Score: 6    End of Session   Activity Tolerance: Treatment limited secondary to agitation Patient left: in bed;with call bell/phone within reach (on stretcher in ED with posey alarm in place) Nurse Communication: Mobility status PT Visit Diagnosis: Unsteadiness on feet (R26.81);Muscle weakness (generalized) (M62.81);Difficulty in walking, not elsewhere classified (R26.2)    Time: [id number] PT Time Calculation (min) (ACUTE ONLY): 10 min   Charges:   PT Evaluation $PT Eval Moderate Complexity: 1 Mod          Reuel Derby, PT, DPT  Acute Rehabilitation Services  Office: [phone]   Rudean Hitt 07/23/2022, 1:44 PM

## 2022-07-23 NOTE — Progress Notes (Signed)
Neurology Progress Note  Brief HPI: Chelsea Fuller is a 88yo F h/o CHF, CAD, STEMI, Afib on Eliquis, prior L MCA infarct presenting from home with concern for stroke. LKW was 11am when she woke up then when she was eating breakfast around 1130am, home health aide noted patient was dripping food from the R side of her mouth. Recently had fall 2wks ago which required increasing assistance for ADLs, also currently undergoing outpatient treatment for UTI.  Subjective: Asleep when arriving in room, aroused after yelling a few times. Seemed to be having visual hallucinations during exam, she stated that her son was in the room and gestured toward her right side.  Collateral obtained from: Daughter over the phone  Exam: Vitals:   07/23/22 0610 07/23/22 0830  BP:  (!) 188/103  Pulse:  (!) 141  Resp:  (!) 25  Temp: 98.4 F (36.9 C)   SpO2:  95%   Gen: In bed, NAD, mitts in place on bilateral upper extremities Resp: non-labored breathing, no acute distress Abd: soft, nt  Neuro: Mental Status: Patient is only oriented to self. She states it is 22 and that she is in [facility]. Occasionally follows simple commands. Speech is aphasic, sometimes understandable other times nonsensical. Cranial Nerves: Visual fields grossly intact, EOMI, PERRL. Stated facial sensation was not the same to bilateral light touch but unable to describe the difference. Resting right sided facial droop. Exam limited by mental status.  Motor: Strength is normal in upper extremities bilaterally. Unable to assess lower extremities. Sensory: Exam limited by mental status.  Gait: Deferred  Pertinent Labs: Lab Results  Component Value Date   WBC 6.9 07/23/2022   HGB 12.7 07/23/2022   HCT 40.5 07/23/2022   MCV 98.3 07/23/2022   PLT 139 (L) 07/23/2022    UA: Moderate WBCs, Urine culture pending.  Blood Culture #1: No growth., culture #2 pending. Lactic Acid 1.5, 1.6  Imaging I have reviewed the images  obtained:   CT-scan of the brain 10/1: No evidence of acute intracranial abnormality. Parenchymal atrophy, chronic small vessel disease and chronic infarcts as described. Paranasal sinus disease, as outlined. 18 mm polypoid soft tissue focus within the left nasal passage. Direct visualization recommended.   CTA head and neck w/wo 10/1: CTA neck:   1. The common carotid, internal carotid and vertebral arteries are patent within the neck without hemodynamically significant stenosis. Atherosclerotic plaque, as described. 2.  Aortic Atherosclerosis (ICD10-I70.0).   CTA head:   1. No intracranial large vessel occlusion is identified. 2. Intracranial atherosclerotic disease, most notably as follows. 3. Sites of moderate stenosis within multiple proximal M2 left MCA vessels. 4. Severe stenosis within the right PCA at the P2/P3 junction.  MRI brain w/o Contrast (07/22/2022): 1. Intermittently motion degraded examination, as described. 2. No evidence of acute intracranial abnormality. Specifically, the diffusion-weighted imaging is of good quality and there is no evidence of acute infarct. 3. Parenchymal atrophy, chronic small vessel ischemic disease and chronic infarcts as described. 4. Paranasal sinus disease, as outlined. 5. 18 mm polypoid soft tissue focus within the left nasal passage. Direct visualization recommended.  Neurodiagnostics Routine EEG yesterday-essentially nondiagnostic due to excessive EMG artifact.  Assessment: 88yo F h/o CHF, CAD, STEMI, Afib on Eliquis, prior L MCA infarct presents for evaluation of confusion and right sided facial droop. She continues to be confused with fluctuating affect and limited ability to follow commands appropriately. UA positive for WBCs with known UTI and has been receiving treatment prior to presentation.  CT head, CTA head/neck, and MRI brain demonstrate no acute intracranial abnormality.  EEG rather nondiagnostic due to artifact.  Etiologies for this patient's encephalopathy includes progression of dementia, toxic metabolic encephalopathy in the setting of underlying partially treated UTI, evaluate for underlying seizures, evaluate for metabolic derangements..  Recommendations: - Reversible causes of dementia panel ordered  B1, B12, Folate, TSH, T3, T4, RPR - Treat UTI - primary team managing with Rocephin ordered. - Repeat routine EEG when able due to-not cooperative with repeat MRI earlier this morning.  I have requested that the technologist reattempt EEG. -Management of the incidental left nasal passage polypoid tissue finding per primary team.   Attending addendum Patient seen and examined this morning independently and with Wilhemina Cash PA student.  Briefly, 88 year old with history of CHF CAD STEMI A-fib on Eliquis and prior left MCA infarct presenting with concern for stroke.  Outside the window for intervention.  Initially had right facial drooping and confusion.  Currently receiving treatment for UTI as an outpatient. Remains drowsy and sleepy.  Speech-mumbled and incomprehensible.  No gross cranial nerve deficits except for slight right lower facial asymmetry at rest.  Withdraws all fours to noxious stimulation. Neurology team reached out to family.  They report that she was getting ready to be placed in a assisted living facility.  Walking using a rollator but requiring more and more help with ADLs to the point that she requires 24/7 assistance. Has had a gradual decline in her mentation and cognition for 2 years.  This was exacerbated over the last 2 weeks after she had a fall after which she had a more difficult time with her gait as well as memory. Agree with the assessment and plan above that I have made edits to as needed. I have personally reviewed imaging-MRI of the brain does not show any evidence of acute abnormality although exam is severely motion degraded intermittently.  There is parenchymal atrophy and  chronic small vessel ischemic disease.  Incidental paranasal sinus disease and 18 mm polypoid soft tissue focus within the left nasal passage for which direct visualization is recommended-defer to primary team. We will follow.   -- Amie Portland, MD Neurologist [facility] Pager: [phone]

## 2022-07-23 NOTE — Progress Notes (Signed)
EEG complete - results pending 

## 2022-07-23 NOTE — ED Notes (Signed)
Per internal medicine MD, PRN Xanax administered to pt to help pt rest. Will continue to monitor for therapeutic effects.

## 2022-07-23 NOTE — NC FL2 (Signed)
[place] LEVEL OF CARE SCREENING TOOL     IDENTIFICATION  Patient Name: Chelsea Fuller Birthdate: Aug 06, 1924 Sex: female Admission Date (Current Location): 07/22/2022  [facility] and [place] Number:  Herbalist and Address:  [facility], [place] [address], [place], [place] [address]      Provider Number: [id number]  Attending Physician Name and Address:  Velna Ochs, MD  Relative Name and Phone Number:       Current Level of Care: Hospital Recommended Level of Care: Price Prior Approval Number:    Date Approved/Denied:   PASRR Number: [id number]  Discharge Plan: SNF    Current Diagnoses: Patient Active Problem List   Diagnosis Date Noted   Slurred speech 07/22/2022   Aphasia    Dyspnea 09/30/2018   PICC (peripherally inserted central catheter) in place 11/28/2017   Abnormal LFTs 11/08/2017   Thrombocytopenia ([place]) 11/08/2017   Normocytic anemia 11/08/2017   Elevated AST (SGOT) 11/07/2017   MRSA bacteremia 11/05/2017   Weakness generalized 11/04/2017   Acute cystitis without hematuria    Ischemic cardiomyopathy 10/30/2017   CAD (coronary artery disease)-DES/LAD 2017 10/30/2017   Hypoxia 10/30/2017   Acute on chronic systolic heart failure ([place]) 10/30/2017   CKD (chronic kidney disease) stage 3, GFR 30-59 ml/min ([place]) 10/30/2017   Fever 10/30/2017   Accelerated hypertension 09/24/2016   Acute on chronic diastolic CHF (congestive heart failure) ([place]) 09/23/2016   Old anterior ST elevation MI    Chronic atrial fibrillation    Epistaxis 10/04/2013   Anemia 10/04/2013   Nasal fracture 10/04/2013   History of CVA (cerebrovascular accident) 10/04/2013   HTN (hypertension) 10/04/2013   Hypothyroidism 10/04/2013    Orientation RESPIRATION BLADDER Height & Weight     Self, Time, Situation  Normal Incontinent, External catheter Weight:   Height:     BEHAVIORAL SYMPTOMS/MOOD NEUROLOGICAL BOWEL  NUTRITION STATUS      Continent Diet  AMBULATORY STATUS COMMUNICATION OF NEEDS Skin   Limited Assist Verbally Normal                       Personal Care Assistance Level of Assistance  Bathing, Dressing, Feeding Bathing Assistance: Limited assistance Feeding assistance: Limited assistance Dressing Assistance: Limited assistance     Functional Limitations Info  Speech, Hearing, Sight Sight Info: Adequate Hearing Info: Adequate Speech Info: Adequate    SPECIAL CARE FACTORS FREQUENCY  PT (By licensed PT), OT (By licensed OT)     PT Frequency: 5x weekly OT Frequency: 5x weekly            Contractures Contractures Info: Not present    Additional Factors Info  Code Status, Allergies, Isolation Precautions Code Status Info: DNR Allergies Info: No known allergies     Isolation Precautions Info: MRSA     Current Medications (07/23/2022):  This is the current hospital active medication list Current Facility-Administered Medications  Medication Dose Route Frequency Provider Last Rate Last Admin   acetaminophen (TYLENOL) tablet 650 mg  650 mg Oral Q6H PRN Rick Duff, MD       Or   acetaminophen (TYLENOL) suppository 650 mg  650 mg Rectal Q6H PRN Rick Duff, MD       ALPRAZolam Duanne Moron) tablet 0.5 mg  0.5 mg Oral QHS PRN Rick Duff, MD       apixaban Arne Cleveland) tablet 2.5 mg  2.5 mg Oral BID Nooruddin, Saad, MD   2.5 mg at 07/22/22  2126   carvedilol (COREG) tablet 3.125 mg  3.125 mg Oral BID WC Romana Juniper, MD   3.125 mg at 07/22/22 2121   cefTRIAXone (ROCEPHIN) 1 g in sodium chloride 0.9 % 100 mL IVPB  1 g Intravenous Q24H Rick Duff, MD       insulin aspart (novoLOG) injection 0-9 Units  0-9 Units Subcutaneous TID WC Rick Duff, MD       QUEtiapine (SEROQUEL) tablet 100 mg  100 mg Oral PRN Rick Duff, MD       sodium chloride 0.9 % bolus 1,000 mL  1,000 mL Intravenous Once Rick Duff, MD       Current Outpatient  Medications  Medication Sig Dispense Refill   acetaminophen (TYLENOL) 500 MG tablet Take 500 mg by mouth at bedtime as needed for mild pain.      ALPRAZolam (XANAX) 0.5 MG tablet Take 0.5 mg by mouth daily as needed for anxiety.     busPIRone (BUSPAR) 10 MG tablet Take 10 mg by mouth 3 (three) times daily.     carvedilol (COREG) 3.125 MG tablet TAKE 1 TABLET BY MOUTH 2 TIMES DAILY WITH A MEAL. (Patient taking differently: Take 3.125 mg by mouth 2 (two) times daily with a meal.) 180 tablet 3   ELIQUIS 2.5 MG TABS tablet TAKE 1 TABLET BY MOUTH TWICE A DAY (Patient taking differently: Take 2.5 mg by mouth 2 (two) times daily.) 180 tablet 1   furosemide (LASIX) 20 MG tablet Take 1 tablet (20 mg total) by mouth daily. 90 tablet 3   levothyroxine (SYNTHROID) 75 MCG tablet Take 75 mcg by mouth daily before breakfast.     Magnesium 200 MG TABS Take 200 mg by mouth daily.     Multiple Vitamins-Minerals (OCUVITE ADULT 50+) CAPS Take 1 capsule by mouth daily.     nitrofurantoin, macrocrystal-monohydrate, (MACROBID) 100 MG capsule Take 100 mg by mouth 2 (two) times daily. X 7 days     nitroGLYCERIN (NITROSTAT) 0.4 MG SL tablet Place 0.4 mg under the tongue every 5 (five) minutes as needed for chest pain.      Propylene Glycol (SYSTANE BALANCE) 0.6 % SOLN Place 1 drop into both eyes daily as needed (dry eyes).     QUEtiapine (SEROQUEL) 100 MG tablet Take 100 mg by mouth at bedtime.     spironolactone (ALDACTONE) 25 MG tablet Take 0.5 tablets (12.5 mg total) by mouth every Monday, Wednesday, and Friday. 20 tablet 3   valsartan (DIOVAN) 160 MG tablet Take 160 mg by mouth daily.       Discharge Medications: Please see discharge summary for a list of discharge medications.  Relevant Imaging Results:  Relevant Lab Results:   Additional Information SSN: [id number]  Archie Endo, LCSW

## 2022-07-23 NOTE — Progress Notes (Signed)
Subjective:   Summary: Chelsea Fuller is a 88 y.o. year old female currently admitted on the IMTS HD#0 for acute encephalopathy.  Overnight Events: Patient became agitated, Ativan was given  Patient was seen this morning in the emergency department.  She was extremely agitated, and was orientated to person, however not time or location.  Ordered an injection of Ativan, which helped calm patient down.  Objective:  Vital signs in last 24 hours: Vitals:   07/23/22 1200 07/23/22 1215 07/23/22 1230 07/23/22 1231  BP: (!) 133/51 (!) 136/50 (!) 133/55   Pulse: 69 69 69   Resp: 15 19 18    Temp:    98.7 F (37.1 C)  TempSrc:    Oral  SpO2: 97% 97% 95%    Supplemental O2: Nasal Cannula SpO2: 95 % O2 Flow Rate (L/min): 2 L/min   Physical Exam:  Constitutional: Elderly, agitated female Cardiovascular: RRR, no murmurs, rubs or gallops Pulmonary/Chest: normal work of breathing on room air, lungs clear to auscultation bilaterally Abdominal: soft, non-tender, non-distended Skin: warm and dry Extremities: upper/lower extremity pulses 2+, no lower extremity edema present  There were no vitals filed for this visit.  No intake or output data in the 24 hours ending 07/23/22 1342 Net IO Since Admission: No IO data has been entered for this period [07/23/22 1342]  Pertinent Labs:    Latest Ref Rng & Units 07/23/2022    2:15 AM 07/22/2022    1:33 PM 07/22/2022    1:32 PM  CBC  WBC 4.0 - 10.5 K/uL 6.9   4.4   Hemoglobin 12.0 - 15.0 g/dL 12.7  12.9  12.8   Hematocrit 36.0 - 46.0 % 40.5  38.0  40.0   Platelets 150 - 400 K/uL 139   144        Latest Ref Rng & Units 07/23/2022    2:15 AM 07/22/2022    1:33 PM 07/22/2022    1:32 PM  CMP  Glucose 70 - 99 mg/dL 111  205  208   BUN 8 - 23 mg/dL 18  24  20    Creatinine 0.44 - 1.00 mg/dL 1.51  1.80  1.71   Sodium 135 - 145 mmol/L 140  140  138   Potassium 3.5 - 5.1 mmol/L 4.1  4.3  4.3   Chloride 98 - 111 mmol/L 104   101  102   CO2 22 - 32 mmol/L 25   32   Calcium 8.9 - 10.3 mg/dL 8.8   8.4   Total Protein 6.5 - 8.1 g/dL   6.6   Total Bilirubin 0.3 - 1.2 mg/dL   0.9   Alkaline Phos 38 - 126 U/L   60   AST 15 - 41 U/L   22   ALT 0 - 44 U/L   9       Imaging: US RENAL  Result Date: 07/23/2022 CLINICAL DATA:  Acute kidney injury, cystic masses noted in the kidneys on previous imaging. EXAM: RENAL / URINARY TRACT ULTRASOUND COMPLETE COMPARISON:  January of 2019 CT of the abdomen and pelvis. FINDINGS: Right Kidney: Renal measurements: 8.2 x 3.6 x 4.2 cm = volume: 64.5 mL. 2.1 x 2.0 x 2.2 cm cyst in the upper pole the RIGHT kidney. Smaller renal sinus cyst as well not as well characterized but seen on imaging as far back as 2019 measuring approximately 13 mm. No dedicated  imaging follow-up is recommended for these findings. No hydronephrosis. Mild parenchymal thinning. Mild increased cortical echogenicity. Left Kidney: Renal measurements: 8.3 x 3.7 x 3.6 cm = volume: 58.7 mL. Mild parenchymal thinning. Mild increased cortical echogenicity. No hydronephrosis. No visible calculi. Bladder: Appears normal for degree of bladder distention. Other: None. IMPRESSION: Signs of medical renal disease and RIGHT renal cysts without hydronephrosis. Electronically Signed   By: Zetta Bills M.D.   On: 07/23/2022 09:59   EEG adult  Result Date: 07/22/2022 Derek Jack, MD     07/22/2022  7:05 PM Routine EEG Report Chelsea Fuller is a 88 y.o. female with a history of aphasia who is undergoing an EEG to evaluate for seizures. Report: This EEG was acquired with electrodes placed according to the International 10-20 electrode system (including Fp1, Fp2, F3, F4, C3, C4, P3, P4, O1, O2, T3, T4, T5, T6, A1, A2, Fz, Cz, Pz). The following electrodes were missing or displaced: none. The occipital dominant rhythm was 8.5 Hz. This activity is reactive to stimulation. Drowsiness was manifested by background fragmentation; deeper stages  of sleep were not identified. There was continuous EMG artifact in the bifrontal L>R areas preventing adequate evaluation of those regions. In the posterior regions, no epileptiform abnormalities were noted. Photic stimulation and hyperventilation were not performed. Impression: This EEG was obtained while awake and drowsy and is normal in the posterior regions. EMG artifact prevents evaluation of the bifrontal L>R regions which includes the area of interest for aphasia. If clinical suspicion for seizures remains please order repeat EEG.   Clinical Correlation: Normal EEGs, however, do not rule out epilepsy. Su Monks, MD [facility] [phone] If 7pm- 7am, please page neurology on call as listed in [place].   MR BRAIN WO CONTRAST  Result Date: 07/22/2022 CLINICAL DATA:  Provided history: Neuro deficit, acute, stroke suspected. EXAM: MRI HEAD WITHOUT CONTRAST TECHNIQUE: Multiplanar, multiecho pulse sequences of the brain and surrounding structures were obtained without intravenous contrast. COMPARISON:  Noncontrast head CT and CT angiogram head/neck performed earlier today 07/22/2022. FINDINGS: Intermittently motion degraded examination, limiting evaluation. Most notably, the sagittal T1 weighted sequence is moderate to severely motion degraded, the axial SWI sequence is mild to moderately motion degraded, the axial T1 weighted sequences severely motion degraded and the coronal T2 sequence is severely motion degraded. Brain: Moderate generalized cerebral atrophy. Chronic left MCA territory cortical/subcortical infarcts within the left frontal lobe, left insula and left parietal lobe. Chronic lacunar infarcts within the right basal ganglia and thalamus. Tiny chronic infarcts within the bilateral cerebellar hemispheres. There is no acute infarct. Within the limitations of motion degradation, no appreciable intracranial mass, chronic intracranial blood products or extra-axial fluid collection. No  midline shift. Vascular: Maintained flow voids within the proximal large arterial vessels. Skull and upper cervical spine: No focal suspicious marrow lesion. Apparent C3-C4 vertebral body ankylosis. Incompletely assessed cervical spondylosis. Sinuses/Orbits: No mass or acute finding within the imaged orbits. Prior bilateral ocular lens replacement. Large fluid level, and background mild mucosal thickening, within the left maxillary sinus. The left sphenoid sinus is completely opacified by inspissated secretions. Mild mucosal thickening within the anterior left ethmoid air cells. Other: 18 mm T2 hypointense polypoid soft tissue focus within the left nasal passage. IMPRESSION: 1. Intermittently motion degraded examination, as described. 2. No evidence of acute intracranial abnormality. Specifically, the diffusion-weighted imaging is of good quality and there is no evidence of acute infarct. 3. Parenchymal atrophy, chronic small vessel ischemic disease and chronic infarcts as  described. 4. Paranasal sinus disease, as outlined. 5. 18 mm polypoid soft tissue focus within the left nasal passage. Direct visualization recommended. Electronically Signed   By: Kellie Simmering D.O.   On: 07/22/2022 16:38   CT ANGIO HEAD NECK W WO CM (CODE STROKE)  Result Date: 07/22/2022 CLINICAL DATA:  Neuro deficit, acute, stroke suspected. EXAM: CT ANGIOGRAPHY HEAD AND NECK TECHNIQUE: Multidetector CT imaging of the head and neck was performed using the standard protocol during bolus administration of intravenous contrast. Multiplanar CT image reconstructions and MIPs were obtained to evaluate the vascular anatomy. Carotid stenosis measurements (when applicable) are obtained utilizing NASCET criteria, using the distal internal carotid diameter as the denominator. RADIATION DOSE REDUCTION: This exam was performed according to the departmental dose-optimization program which includes automated exposure control, adjustment of the mA and/or kV  according to patient size and/or use of iterative reconstruction technique. CONTRAST:  Administered contrast not known at this time. COMPARISON:  Noncontrast head CT performed earlier today. FINDINGS: CTA NECK FINDINGS Aortic arch: Common origin of the innominate and left common carotid arteries. Atherosclerotic plaque within the visualized aortic arch and proximal major branch vessels of the neck. No hemodynamically significant innominate or proximal subclavian artery stenosis. Right carotid system: CCA and ICA patent within the neck without hemodynamically significant stenosis (50% or greater). Atherosclerotic plaque about the carotid bifurcation and within the proximal ICA. Left carotid system: CCA and ICA patent within the neck without hemodynamically significant stenosis (50% or greater). Atherosclerotic plaque about the carotid bifurcation and within the proximal ICA. Vertebral arteries: Vertebral arteries patent within the neck. Nonstenotic atherosclerotic plaque within both vessels. The left vertebral artery is slightly dominant. Skeleton: Cervical spondylosis. No acute fracture or aggressive osseous lesion. Other neck: No neck mass or cervical lymphadenopathy. Upper chest: No consolidation within the imaged lung apices. Review of the MIP images confirms the above findings CTA HEAD FINDINGS Anterior circulation: The intracranial internal carotid arteries are patent. Atherosclerotic plaque within both vessels with no more than mild stenosis. The M1 middle cerebral arteries are patent. No M2 proximal branch occlusion is identified. Atherosclerotic irregularity of the M2 and more distal MCA vessels, bilaterally. Most notably, there are sites of moderate stenosis within multiple proximal M2 left MCA vessels (for instance as seen on series 12, image 27). The anterior cerebral arteries are patent. No intracranial aneurysm is identified. Posterior circulation: The intracranial vertebral arteries are patent.  Atherosclerotic plaque within the dominant intracranial left vertebral artery with no more than mild stenosis. Nonstenotic atherosclerotic plaque within the non dominant intracranial right vertebral artery. The basilar artery is patent. The posterior cerebral arteries are patent. Atherosclerotic irregularity of both vessels. Most notably, there is a severe stenosis within the right PCA at the P2/P3 junction. The right PCA is fetal in origin. A left posterior communicating artery is present. Venous sinuses: Within the limitations of contrast timing, no convincing thrombus. Anatomic variants: As described. Review of the MIP images confirms the above findings No emergent large vessel occlusion identified. These results were communicated to Dr. Lorrin Goodell At 2:00 pmon 10/1/2023by text page via the [facility] messaging system. IMPRESSION: CTA neck: 1. The common carotid, internal carotid and vertebral arteries are patent within the neck without hemodynamically significant stenosis. Atherosclerotic plaque, as described. 2.  Aortic Atherosclerosis (ICD10-I70.0). CTA head: 1. No intracranial large vessel occlusion is identified. 2. Intracranial atherosclerotic disease, most notably as follows. 3. Sites of moderate stenosis within multiple proximal M2 left MCA vessels. 4. Severe stenosis within the right  PCA at the P2/P3 junction. Electronically Signed   By: Kellie Simmering D.O.   On: 07/22/2022 14:00     Assessment/Plan:   Principal Problem:   Slurred speech   Patient Summary: Chelsea Fuller is a 88 y.o. with a pertinent PMH of permanent atrial fibrillation on Eliquis, CVA without residual defect deficits, coronary artery disease with STEMI in 2017 status post DES x2 to the LAD, hypertension, CKD, who presented with acute encephalopathy and admitted for acute encephalopathy secondary to unresolved UTI.    #Acute encephalopathy secondary to unresolved UTI Patient was diagnosed with a UTI on Friday, September 29,  and was subsequently given nitrofurantoin for treatment.  UA showed moderate leukocytes.  Patient continues to be afebrile and no leukocytosis is present at the moment.  Unable to get accurate assessment due to patient's agitation this morning however there is current evidence for her UTI being unresolved at the moment, which may have led to the acute encephalopathy.  Plan: - Day 1 of ceftriaxone for UTI treatment - PT/OT on board and currently working with the patient.  They recommend SNF placement after discharge. -Urine and blood cultures currently processing still -We will recollect urine culture -Neurology on board as well, and attempted to get a repeat EEG today however patient was too agitated to comply. - Neurology also ordered reversible cause of dementia panel which includes B1, B12, folate, TSH, T3, T4, RPR   #Agitation/sundowning Patient has a history of sundowning around 5 PM, and would take Xanax .5 mg, and Seroquel 100 mg around that time.  This morning per exam, and also per night team last night and experienced agitation.  Was resolved with Ativan injection.  We previously had Xanax 0.25 and Seroquel 50 has as needed medications.  However now we will schedule them, considering patient has taken these medications for a while now.  #Permanent A-fib Patient has a history of A-fib however it has been controlled with Coreg 3.125 and anticoagulated with Eliquis 5 mg.  Pulses have been 60+.  Plan: - Continue Eliquis 5 mg - Coreg 3.125 mg daily  #Hypertension Patient has a history of hypertension, was previously taking spironolactone 12.5 mg daily, and valsartan 165 mg, however patient currently has an AKI so we will currently hold dose.  Blood pressure has been stable most recent 1 being 122/74.  Plan: - Continue monitoring blood pressure, consider adding hydralazine or increasing Coreg to patient's regimen  #AKI on CKD Patient's creatinine is now currently 1.51, which is down  from 1.8.  Unsure if this is progression of CKD and 1.5 is her new baseline, or if there is still room for improvement.    Plan: - Daily BMPs  Code: DNR    Dispo: Anticipated discharge in [place] Marilla Boddy, MD PGY-1 Internal Medicine Resident Pager Number [phone] Please contact the on call pager after 5 pm and on weekends at [phone].

## 2022-07-23 NOTE — Progress Notes (Signed)
SLP Cancellation Note  Patient Details Name: Chelsea Fuller MRN: [id number] DOB: June 19, 1924   Cancelled treatment:        Pt passed Trevor Mace screen; therefore, per protocol, SLP swallow evaluation is not warranted. If she presents with further difficulty, we will be happy to see her.  Otherwise, will sign off.  Thank you.  Donnae Michels L. Tivis Ringer, MA CCC/SLP Clinical Specialist - Acute Care SLP Acute Rehabilitation Services Office number [phone]    Juan Quam Laurice 07/23/2022, 4:28 PM

## 2022-07-23 NOTE — Evaluation (Signed)
Occupational Therapy Evaluation Patient Details Name: Chelsea Fuller MRN: [id number] DOB: 13-Jul-1924 Today's Date: 07/23/2022   History of Present Illness Pt is a 88 y/o female who presented with slurred speech and AMS. MRI negative for acute infarct. EEG pending. PMH: a fib, CVA, CAD with STEMI, HTN, CKDIII   Clinical Impression   Pt presents to acute OT with deficits in cognition impacting participation in ADLs/mobility. Unsure of PLOF as pt poor historian and no family present at time of eval. Per chart, pt ambulatory with Rollator and has caregiver assist for ADLs. Overall, pt requires Total A x 2 for rolling for linen change, kept eyes closed for entirety of session and unable to follow commands. Pt requires overall Total A for ADLs at this time. Rec SNF rehab at DC at this time though will continue to follow acutely. Anticipate fair progress towards goals once cognition improves.      Recommendations for follow up therapy are one component of a multi-disciplinary discharge planning process, led by the attending physician.  Recommendations may be updated based on patient status, additional functional criteria and insurance authorization.   Follow Up Recommendations  Skilled nursing-short term rehab (<3 hours/day) (pending cognitive improvements)    Assistance Recommended at Discharge Frequent or constant Supervision/Assistance  Patient can return home with the following A lot of help with walking and/or transfers;A lot of help with bathing/dressing/bathroom    Functional Status Assessment  Patient has had a recent decline in their functional status and demonstrates the ability to make significant improvements in function in a reasonable and predictable amount of time.  Equipment Recommendations  Other (comment) (TBD pending progress)    Recommendations for Other Services       Precautions / Restrictions Precautions Precautions: Fall Restrictions Weight Bearing Restrictions:  No      Mobility Bed Mobility Overal bed mobility: Needs Assistance Bed Mobility: Rolling Rolling: Total assist, +2 for safety/equipment         General bed mobility comments: Total A x 2 for rolling side to side for changing of sheet on stretcher, did swat arms out at therapists initially    Transfers                   General transfer comment: deferred      Balance                                           ADL either performed or assessed with clinical judgement   ADL Overall ADL's : Needs assistance/impaired                                       General ADL Comments: Overall Total A for all ADLs bed level at this time d/t impaired cognition     Vision Ability to See in Adequate Light:  (to be further assessed. pt would not open eyes) Vision Assessment?: Vision impaired- to be further tested in functional context Additional Comments: would not open eyes, to be further assessed     Perception     Praxis      Pertinent Vitals/Pain Pain Assessment Pain Assessment: Faces Faces Pain Scale: Hurts little more Pain Location: "toes" Pain Descriptors / Indicators: Grimacing, Guarding Pain Intervention(s): Monitored during session     Hand Dominance  Extremity/Trunk Assessment Upper Extremity Assessment Upper Extremity Assessment: Difficult to assess due to impaired cognition   Lower Extremity Assessment Lower Extremity Assessment: Defer to PT evaluation       Communication Communication Communication: Expressive difficulties;Other (comment) (mumbled, slurred speech)   Cognition Arousal/Alertness: Lethargic, Suspect due to medications Behavior During Therapy: Agitated, Restless, Anxious Overall Cognitive Status: No family/caregiver present to determine baseline cognitive functioning                                 General Comments: lethargic, mumbled speech throughout. does not follow commands  consistently, did swat out at therapists with rolling on stretcher. would not open eyes on command     General Comments  Mitts in place on entry    Exercises     Shoulder Instructions      Home Living Family/patient expects to be discharged to:: Unsure                                 Additional Comments: per chart, pt lives alone but has family and caregiver support - unsure of home setup, amount of assistance available due to pt poor historian      Prior Functioning/Environment Prior Level of Function : Needs assist;History of Falls (last six months);Patient poor historian/Family not available             Mobility Comments: per chart, uses rollator for mobility. at least one recent fall 2 weeks ago ADLs Comments: per chart, has a caregiver that assists with ADLs. unsure of how much assist pt needs at baseline        OT Problem List: Decreased cognition;Decreased safety awareness;Decreased knowledge of use of DME or AE;Decreased strength      OT Treatment/Interventions: Self-care/ADL training;Therapeutic exercise;Energy conservation;DME and/or AE instruction;Therapeutic activities;Patient/family education;Balance training    OT Goals(Current goals can be found in the care plan section) Acute Rehab OT Goals Patient Stated Goal: none stated today OT Goal Formulation: Patient unable to participate in goal setting Time For Goal Achievement: 08/06/22 Potential to Achieve Goals: Fair  OT Frequency: Min 2X/week    Co-evaluation PT/OT/SLP Co-Evaluation/Treatment: Yes Reason for Co-Treatment: Necessary to address cognition/behavior during functional activity;For patient/therapist safety   OT goals addressed during session: ADL's and self-care      AM-PAC OT "6 Clicks" Daily Activity     Outcome Measure Help from another person eating meals?: Total Help from another person taking care of personal grooming?: Total Help from another person toileting, which  includes using toliet, bedpan, or urinal?: Total Help from another person bathing (including washing, rinsing, drying)?: Total Help from another person to put on and taking off regular upper body clothing?: Total Help from another person to put on and taking off regular lower body clothing?: Total 6 Click Score: 6   End of Session    Activity Tolerance: Treatment limited secondary to agitation;Other (comment) (limited by cognition) Patient left: in bed;with call bell/phone within reach  OT Visit Diagnosis: Other abnormalities of gait and mobility (R26.89);Other symptoms and signs involving cognitive function                Time: 4401-0272 OT Time Calculation (min): 10 min Charges:  OT General Charges $OT Visit: 1 Visit OT Evaluation $OT Eval Moderate Complexity: 1 Mod  Bradd Canary, OTR/L Acute Rehab Services Office: [phone]   Lorre Munroe  07/23/2022, 11:04 AM

## 2022-07-24 ENCOUNTER — Observation Stay (HOSPITAL_COMMUNITY): Payer: Medicare PPO

## 2022-07-24 DIAGNOSIS — I081 Rheumatic disorders of both mitral and tricuspid valves: Secondary | ICD-10-CM | POA: Diagnosis present

## 2022-07-24 DIAGNOSIS — E519 Thiamine deficiency, unspecified: Secondary | ICD-10-CM | POA: Diagnosis present

## 2022-07-24 DIAGNOSIS — I5032 Chronic diastolic (congestive) heart failure: Secondary | ICD-10-CM | POA: Diagnosis present

## 2022-07-24 DIAGNOSIS — N179 Acute kidney failure, unspecified: Secondary | ICD-10-CM | POA: Diagnosis present

## 2022-07-24 DIAGNOSIS — I700 Atherosclerosis of aorta: Secondary | ICD-10-CM | POA: Diagnosis present

## 2022-07-24 DIAGNOSIS — N1832 Chronic kidney disease, stage 3b: Secondary | ICD-10-CM | POA: Diagnosis present

## 2022-07-24 DIAGNOSIS — J810 Acute pulmonary edema: Secondary | ICD-10-CM | POA: Diagnosis present

## 2022-07-24 DIAGNOSIS — Z87891 Personal history of nicotine dependence: Secondary | ICD-10-CM | POA: Diagnosis not present

## 2022-07-24 DIAGNOSIS — F0392 Unspecified dementia, unspecified severity, with psychotic disturbance: Secondary | ICD-10-CM | POA: Diagnosis present

## 2022-07-24 DIAGNOSIS — I4821 Permanent atrial fibrillation: Secondary | ICD-10-CM | POA: Diagnosis present

## 2022-07-24 DIAGNOSIS — I130 Hypertensive heart and chronic kidney disease with heart failure and stage 1 through stage 4 chronic kidney disease, or unspecified chronic kidney disease: Secondary | ICD-10-CM | POA: Diagnosis present

## 2022-07-24 DIAGNOSIS — G928 Other toxic encephalopathy: Secondary | ICD-10-CM | POA: Diagnosis present

## 2022-07-24 DIAGNOSIS — G9340 Encephalopathy, unspecified: Secondary | ICD-10-CM | POA: Diagnosis not present

## 2022-07-24 DIAGNOSIS — R4701 Aphasia: Secondary | ICD-10-CM | POA: Diagnosis present

## 2022-07-24 DIAGNOSIS — Z66 Do not resuscitate: Secondary | ICD-10-CM | POA: Diagnosis present

## 2022-07-24 DIAGNOSIS — N390 Urinary tract infection, site not specified: Secondary | ICD-10-CM | POA: Diagnosis present

## 2022-07-24 DIAGNOSIS — F05 Delirium due to known physiological condition: Secondary | ICD-10-CM | POA: Diagnosis present

## 2022-07-24 DIAGNOSIS — R030 Elevated blood-pressure reading, without diagnosis of hypertension: Secondary | ICD-10-CM | POA: Diagnosis not present

## 2022-07-24 DIAGNOSIS — I6621 Occlusion and stenosis of right posterior cerebral artery: Secondary | ICD-10-CM | POA: Diagnosis present

## 2022-07-24 DIAGNOSIS — E869 Volume depletion, unspecified: Secondary | ICD-10-CM | POA: Diagnosis present

## 2022-07-24 DIAGNOSIS — R451 Restlessness and agitation: Secondary | ICD-10-CM | POA: Diagnosis not present

## 2022-07-24 DIAGNOSIS — D696 Thrombocytopenia, unspecified: Secondary | ICD-10-CM | POA: Diagnosis present

## 2022-07-24 DIAGNOSIS — F1320 Sedative, hypnotic or anxiolytic dependence, uncomplicated: Secondary | ICD-10-CM | POA: Diagnosis present

## 2022-07-24 DIAGNOSIS — R4781 Slurred speech: Secondary | ICD-10-CM | POA: Diagnosis present

## 2022-07-24 DIAGNOSIS — E538 Deficiency of other specified B group vitamins: Secondary | ICD-10-CM | POA: Diagnosis present

## 2022-07-24 DIAGNOSIS — I255 Ischemic cardiomyopathy: Secondary | ICD-10-CM | POA: Diagnosis present

## 2022-07-24 DIAGNOSIS — Z79899 Other long term (current) drug therapy: Secondary | ICD-10-CM | POA: Diagnosis not present

## 2022-07-24 DIAGNOSIS — E039 Hypothyroidism, unspecified: Secondary | ICD-10-CM | POA: Diagnosis present

## 2022-07-24 LAB — BASIC METABOLIC PANEL
Anion gap: 14 (ref 5–15)
BUN: 19 mg/dL (ref 8–23)
CO2: 25 mmol/L (ref 22–32)
Calcium: 9.1 mg/dL (ref 8.9–10.3)
Chloride: 106 mmol/L (ref 98–111)
Creatinine, Ser: 1.44 mg/dL — ABNORMAL HIGH (ref 0.44–1.00)
GFR, Estimated: 33 mL/min — ABNORMAL LOW (ref >60)
Glucose, Bld: 113 mg/dL — ABNORMAL HIGH (ref 70–99)
Potassium: 4.8 mmol/L (ref 3.5–5.1)
Sodium: 145 mmol/L (ref 135–145)

## 2022-07-24 LAB — GLUCOSE, CAPILLARY
Glucose-Capillary: 110 mg/dL — ABNORMAL HIGH (ref 70–99)
Glucose-Capillary: 130 mg/dL — ABNORMAL HIGH (ref 70–99)
Glucose-Capillary: 108 mg/dL — ABNORMAL HIGH (ref 70–99)
Glucose-Capillary: 137 mg/dL — ABNORMAL HIGH (ref 70–99)

## 2022-07-24 LAB — URINE CULTURE

## 2022-07-24 LAB — RPR: RPR Ser Ql: NONREACTIVE

## 2022-07-24 LAB — T3: T3, Total: 67 ng/dL — ABNORMAL LOW (ref 71–180)

## 2022-07-24 MED ORDER — THIAMINE HCL 100 MG/ML IJ SOLN
500.0000 mg | Freq: Every day | INTRAVENOUS | Status: DC
  Administered 2022-07-24: 500 mg via INTRAVENOUS
  Filled 2022-07-24: qty 5

## 2022-07-24 MED ORDER — HALOPERIDOL LACTATE 5 MG/ML IJ SOLN
0.5000 mg | Freq: Once | INTRAMUSCULAR | Status: AC
  Administered 2022-07-25: 0.5 mg via INTRAMUSCULAR
  Filled 2022-07-24: qty 1

## 2022-07-24 MED ORDER — LACTATED RINGERS IV BOLUS
500.0000 mL | Freq: Once | INTRAVENOUS | Status: AC
  Administered 2022-07-24: 500 mL via INTRAVENOUS

## 2022-07-24 MED ORDER — LORAZEPAM 2 MG/ML IJ SOLN
1.0000 mg | Freq: Once | INTRAMUSCULAR | Status: AC
  Administered 2022-07-24: 1 mg via INTRAVENOUS
  Filled 2022-07-24: qty 1

## 2022-07-24 MED ORDER — FUROSEMIDE 10 MG/ML IJ SOLN
40.0000 mg | Freq: Every day | INTRAMUSCULAR | Status: DC
  Administered 2022-07-24: 40 mg via INTRAVENOUS
  Filled 2022-07-24: qty 4

## 2022-07-24 MED ORDER — CYANOCOBALAMIN 1000 MCG/ML IJ SOLN
1000.0000 ug | Freq: Once | INTRAMUSCULAR | Status: AC
  Administered 2022-07-24: 1000 ug via INTRAMUSCULAR
  Filled 2022-07-24: qty 1

## 2022-07-24 MED ORDER — HALOPERIDOL LACTATE 5 MG/ML IJ SOLN
0.5000 mg | Freq: Every evening | INTRAMUSCULAR | Status: DC | PRN
  Administered 2022-07-24: 0.5 mg via INTRAVENOUS
  Filled 2022-07-24: qty 1

## 2022-07-24 MED ORDER — SODIUM CHLORIDE 0.9 % IV SOLN: INTRAVENOUS | Status: DC

## 2022-07-24 MED ORDER — THIAMINE HCL 100 MG/ML IJ SOLN
200.0000 mg | Freq: Three times a day (TID) | INTRAVENOUS | Status: AC
  Administered 2022-07-24 – 2022-07-29 (×14): 200 mg via INTRAVENOUS
  Filled 2022-07-24 (×15): qty 2

## 2022-07-24 MED ORDER — LORAZEPAM 2 MG/ML IJ SOLN: 0.5000 mg | Freq: Every day | INTRAMUSCULAR | Status: DC

## 2022-07-24 NOTE — Progress Notes (Addendum)
Neurology Progress Note  Brief HPI: Chelsea Fuller is a 88yo F h/o CHF, CAD, STEMI, Afib on Eliquis, prior L MCA infarct presenting from home with concern for stroke. LKW was 11am when she woke up then when she was eating breakfast around 1130am, home health aide noted patient was dripping food from the R side of her mouth. Recently had fall 2wks ago which required increasing assistance for ADLs, also received outpatient treatment for known UTI.  Collateral from family: They report that she was getting ready to be placed in a assisted living facility.  Walking using a rollator but requiring more and more help with ADLs to the point that she requires 24/7 assistance. Has had a gradual decline in her mentation and cognition for 2 years.  This was exacerbated over the last 2 weeks after she had a fall after which she had a more difficult time with her gait as well as memory.  Subjective: Patient was agitated overnight, attempting to get out of bed and harming staff. Denying all PO medications. Received 3 doses of Ativan overnight, last given 0600.  She does not respond when spoken to and did not open eyes spontaneously.  Exam: Vitals:   07/24/22 0452 07/24/22 0818  BP: (!) 133/107 (!) 156/96  Pulse: 73 79  Resp: 20 18  Temp: 97.6 F (36.4 C) 98.1 F (36.7 C)  SpO2: 99% 100%   Gen: In bed, NAD, mitts in place on bilateral upper extremities Resp: non-labored breathing, no acute distress Abd: soft, nt  Neuro: Mental Status: Lying in bed, eyes closed, does not open eyes to voice but opens eyes to noxious stimulation.  Mumbles incoherent words.  Otherwise nonverbal. Cranial Nerves: Pupils equal round reactive light, difficult to ascertain extraocular movements, does not blink to threat from either side, face appears grossly symmetric. Motor/Sensory: No spontaneous movement of extremities observed. Groans and withdraws all extremities to light noxious stimuli. Coordination: Unable to  assess. Gait: Deferred.  Pertinent Labs: Lab Results  Component Value Date   WBC 6.9 07/23/2022   HGB 12.7 07/23/2022   HCT 40.5 07/23/2022   MCV 98.3 07/23/2022   PLT 139 (L) 07/23/2022       Latest Ref Rng & Units 07/24/2022    5:28 AM 07/23/2022    2:15 AM 07/22/2022    1:33 PM  BMP  Glucose 70 - 99 mg/dL 113  111  205   BUN 8 - 23 mg/dL 19  18  24    Creatinine 0.44 - 1.00 mg/dL 1.44  1.51  1.80   Sodium 135 - 145 mmol/L 145  140  140   Potassium 3.5 - 5.1 mmol/L 4.8  4.1  4.3   Chloride 98 - 111 mmol/L 106  104  101   CO2 22 - 32 mmol/L 25  25    Calcium 8.9 - 10.3 mg/dL 9.1  8.8      UA 07/22/2022: Moderate WBCs, Urine culture 10,000 CFUs w/ multiple species.  Repeat UA 07/23/2022 shows some ketones, proteinuria, rare bacteria, repeat urine culture in process.  Blood Culture #1 & 2: No growth. Lactic Acid 1.5, 1.6  TSH WNL, T3 marginally decreased, free T4 marginally increased Vitamin B12 WNL, RPR negative.  Imaging I have reviewed the images obtained:   CT-scan of the brain 10/1: No evidence of acute intracranial abnormality. Parenchymal atrophy, chronic small vessel disease and chronic infarcts as described. Paranasal sinus disease, as outlined. 18 mm polypoid soft tissue focus within the left nasal passage.  Direct visualization recommended.   CTA head and neck w/wo 10/1: CTA neck:   1. The common carotid, internal carotid and vertebral arteries are patent within the neck without hemodynamically significant stenosis. Atherosclerotic plaque, as described. 2.  Aortic Atherosclerosis (ICD10-I70.0).   CTA head:   1. No intracranial large vessel occlusion is identified. 2. Intracranial atherosclerotic disease, most notably as follows. 3. Sites of moderate stenosis within multiple proximal M2 left MCA vessels. 4. Severe stenosis within the right PCA at the P2/P3 junction.  MRI brain w/o Contrast (07/22/2022): 1. Intermittently motion degraded examination, as  described. 2. No evidence of acute intracranial abnormality. Specifically, the diffusion-weighted imaging is of good quality and there is no evidence of acute infarct. 3. Parenchymal atrophy, chronic small vessel ischemic disease and chronic infarcts as described. 4. Paranasal sinus disease, as outlined. 5. 18 mm polypoid soft tissue focus within the left nasal passage. Direct visualization recommended.  Neurodiagnostics Routine EEG 07/22/2022-essentially nondiagnostic due to excessive EMG artifact. Repeat EEG 07/23/2022 - generalized slowing suggestive of diffuse encephalopathy, but still with significant EMG and movement artifact.  Assessment: 88yo F h/o CHF, CAD, STEMI, Afib on Eliquis, prior L MCA infarct presents for evaluation of confusion and right sided facial droop. Affect now changed from initial evaluation, was very pleasant at first but now noted to be incredibly agitated per primary team monitoring requiring multiple doses of sedation. UA positive for WBCs with known UTI and has been receiving treatment prior to presentation. CT head, CTA head/neck, and MRI brain demonstrate no acute intracranial abnormality.  Initial EEG rather nondiagnostic due to artifact. Blood work so far ultimately unremarkable for any signs of acute infection or metabolic derangements.  Repeat EEG also with artifact but no evidence of ongoing status epilepticus  Of note patient has also been receiving IV Ativan overnight for agitation-this could significantly alter her mentation given that she was already quite altered at presentation.  Etiologies for this patient's encephalopathy include progression of dementia, delirium, toxic metabolic encephalopathy in the setting of underlying partially treated UTI, evaluate for underlying seizures.  Recommendations: - Continue treatment for UTI - primary team managing with Rocephin. - Management of the incidental left nasal passage polypoid tissue finding per primary  team. - Avoid ativan - use standing doses of seroquel at low dose  Portions of the patient encounter and this note were written and completed by Wilhemina Cash, PA-S. Full patient evaluation and verification of findings were completed by the attending provider, Dr. Rory Percy  Attending Neurohospitalist Addendum Patient seen and examined with APP/Resident. Agree with the history and physical as documented above. Agree with the plan as documented, which I helped formulate. I have independently reviewed the chart, obtained history, review of systems and examined the patient.I have personally reviewed pertinent head/neck/spine imaging (CT/MRI).  Plan was relayed to the primary team resident physician via secure chat. Please feel free to call with any questions.  -- Amie Portland, MD Neurologist [facility] Pager: [phone]

## 2022-07-24 NOTE — Progress Notes (Signed)
Came to evaluate patient after a second episode of agitation that included several attempts to get off bed, hurt herself, and hit housestaff. She has denied all PO medications thus far. She is not currently complaining of pain or and vitals are stable. Patient is s/p 2 doses of IV ativan in the past 24 hrs. Will order another dose now. Will sign out to day team to reassess and manage during day time.   Romana Juniper, MD 07/24/22 5:48 AM

## 2022-07-24 NOTE — Progress Notes (Signed)
Subjective:   Summary: Chelsea Fuller is a 88 y.o. year old female currently admitted on the IMTS HD#0 for toxic metabolic encephalopathy.  Overnight Events: Patient was agitated twice throughout the night, once at midnight and once at 5 AM, required a dose of Ativan for each time.  Patient was seen this a.m. bedside.  She was asleep, likely due to Ativan doses that required overnight.  Of note, she seemed to have more trouble breathing.  Objective:  Vital signs in last 24 hours: Vitals:   07/23/22 2352 07/24/22 0452 07/24/22 0818 07/24/22 1134  BP: 133/75 (!) 133/107 (!) 156/96 (!) 172/45  Pulse: 85 73 79   Resp: 20 20 18 16   Temp: 99 F (37.2 C) 97.6 F (36.4 C) 98.1 F (36.7 C) 98.6 F (37 C)  TempSrc: Oral Axillary Oral Axillary  SpO2: 99% 99% 100% 100%  Weight:       Supplemental O2: Nasal Cannula SpO2: 100 % O2 Flow Rate (L/min): 2 L/min   Physical Exam:  Constitutional: Elderly woman, asleep, in no acute distress Cardiovascular: RRR, no murmurs, rubs or gallops Pulmonary/Chest: Patient is using accessory muscles for breathing, however lungs are clear to auscultation abdominal: soft, non-tender, non-distended Skin: warm and dry Extremities: upper/lower extremity pulses 2+, no lower extremity edema present  [facility] Weights   07/23/22 2031  Weight: 57.6 kg     Intake/Output Summary (Last 24 hours) at 07/24/2022 1317 Last data filed at 07/24/2022 0553 Gross per 24 hour  Intake --  Output 250 ml  Net -250 ml   Net IO Since Admission: -250 mL [07/24/22 1317]  Pertinent Labs:    Latest Ref Rng & Units 07/23/2022    2:15 AM 07/22/2022    1:33 PM 07/22/2022    1:32 PM  CBC  WBC 4.0 - 10.5 K/uL 6.9   4.4   Hemoglobin 12.0 - 15.0 g/dL 12.7  12.9  12.8   Hematocrit 36.0 - 46.0 % 40.5  38.0  40.0   Platelets 150 - 400 K/uL 139   144        Latest Ref Rng & Units 07/24/2022    5:28 AM 07/23/2022    2:15 AM 07/22/2022    1:33 PM  CMP   Glucose 70 - 99 mg/dL 113  111  205   BUN 8 - 23 mg/dL 19  18  24    Creatinine 0.44 - 1.00 mg/dL 1.44  1.51  1.80   Sodium 135 - 145 mmol/L 145  140  140   Potassium 3.5 - 5.1 mmol/L 4.8  4.1  4.3   Chloride 98 - 111 mmol/L 106  104  101   CO2 22 - 32 mmol/L 25  25    Calcium 8.9 - 10.3 mg/dL 9.1  8.8       Imaging: EEG adult  Result Date: 07/23/2022 Lora Havens, MD     07/23/2022  4:22 PM Patient Name: KAYLENE SHADDOCK MRN: [id number] Epilepsy Attending: Lora Havens Referring Physician/Provider: Greta Doom, MD Date: 07/23/2022 Duration: 29.52 mins Patient history: 88yo F h/o CHF, CAD, STEMI, Afib on Eliquis, prior L MCA infarct presents for evaluation of confusion and right sided facial droop.EEG to evaluate for seizure Level of alertness: Awake AEDs during EEG study: None Technical aspects: This EEG study was done with scalp electrodes positioned according to the 10-20 International system of electrode placement.  Electrical activity was reviewed with band pass filter of 1-70Hz , sensitivity of 7 uV/mm, display speed of 91mm/sec with a 60Hz  notched filter applied as appropriate. EEG data were recorded continuously and digitally stored.  Video monitoring was available and reviewed as appropriate. Description: No clear posterior dominant rhythm was seen. EEG showed continuous generalized 3 to 6 Hz theta-delta slowing. Hyperventilation and photic stimulation were not performed.   Of note, study was technically difficult due to significant myogenic and movement artifact. ABNORMALITY - Continuous slow, generalized IMPRESSION: This technically difficult study is suggestive of moderate diffuse encephalopathy, nonspecific etiology. No seizures or epileptiform discharges were seen throughout the recording. Lack of epileptiform activity on interictal EEG does not exclude the diagnosis of epilepsy. If suspicion for interictal activity remains a concern, a prolonged study including sleep  can  be considered. Priyanka Barbra Sarks     Assessment/Plan:   Principal Problem:   Acute encephalopathy Active Problems:   UTI (urinary tract infection)   AKI (acute kidney injury) ([place])   Patient Summary: Chelsea Fuller is a 88 y.o. with a pertinent PMH of permanent atrial fibrillation on Eliquis, CVA without residual deficits, coronary artery disease with STEMI in 2017 status post DES x2 to the LAD, hypertension, CKD, who presented with acute encephalopathy and admitted for toxic metabolic encephalopathy secondary to unresolved UTI.    #Toxic metabolic encephalopathy secondary to unresolved UTI Urine analysis was unrevealing, besides ketones at 20, which could be due to a starvation ketosis due to poor p.o. intake.  Cultures are still in progress.  This is day 2 of ceftriaxone.  Still unsure of the exact cause of this encephalopathy.  It was noticed that her B12 level was low to normal, will supplement.  Could also be a case of Warnicke's in the setting of poor p.o. intake, still currently waiting thiamine values.  On exam today, patient had increased work of breathing with her accessory muscles, so chest x-ray was ordered to assess. In the meantime, we will continue treating UTI.  Attempts were made to get in contact with daughter, however was unsuccessful.  We will continue to try.  Plan: -Continue ceftriaxone - Await thiamine level result - Continue B12 infusion - Await chest x-ray reading - Maintenance fluid will be administered after chest x-ray has been read indicating no edema, or volume overloaded state -Await repeat EKG  #Increased Work of Breathing  Pt was noted on exam today that her accessory muscles were being used to breathe. Chest X-Ray revealed pulmonary edema. Unsure the etiology, could be due to spreading of infectious process.   Plan:  - Lasix 40 IV  #Agitation/sundowning Patient had 2 episodes of agitation overnight, one at midnight and 1 around 5 AM.  Both  required a dose of Ativan.  We have now scheduled 0.5 mg of Ativan for bedtime, as this was her home medication.  Patient's home dose of Seroquel is 100 mg which she will take each night before bed, however due to patient's agitation unable to take p.o., so switch to Haldol 0.5 mg at night time.  #Permanent A-fib Patient has a history of A-fib has been controlled with Coreg 3.125 anticoagulated with Eliquis 5 mg.  Of note, patient has not been able to take p.o. medication, but at this point does not need to be bridged to heparin.  #AKI on CKD Patient's creatinine continues to trend down, currently at 1.44.  We will continue to trend creatinine with daily BMPs    Code: Full  Dispo: Anticipated discharge t in less than 2 midnights Drucie Opitz, MD PGY-1 Internal Medicine Resident Pager Number [phone] Please contact the on call pager after 5 pm and on weekends at [phone].

## 2022-07-24 NOTE — TOC Initial Note (Signed)
Transition of Care [facility]) - Initial/Assessment Note    Patient Details  Name: Chelsea Fuller MRN: [id number] Date of Birth: 02-04-1924  [facility]) CM/SW Contact:    Geralynn Ochs, LCSW Phone Number: 07/24/2022, 3:20 PM  Clinical Narrative:             CSW spoke with daughter, Chelsea Fuller, to discuss current recommendation for SNF. Patient was supposed to move [facility] in [place] yesterday, but patient not currently at a functional level appropriate for ALF. Family in agreement with SNF, preference for [facility] in [place] or [place]. CSW to send out referral when patient's mental status has improved.       Expected Discharge Plan: Skilled Nursing Facility Barriers to Discharge: Continued Medical Work up, Ship broker   Patient Goals and CMS Choice Patient states their goals for this hospitalization and ongoing recovery are:: patient unable to participate in goal setting, not oriented CMS Medicare.gov Compare Post Acute Care list provided to:: Patient Represenative (must comment) Choice offered to / list presented to : Adult Children  Expected Discharge Plan and Services Expected Discharge Plan: [place] Acute Care Choice: [place] arrangements for the past 2 months: Single Family Home                                      Prior Living Arrangements/Services Living arrangements for the past 2 months: Single Family Home Lives with:: Self Patient language and need for interpreter reviewed:: No Do you feel safe going back to the place where you live?: Yes      Need for Family Participation in Patient Care: Yes (Comment) Care giver support system in place?: No (comment)   Criminal Activity/Legal Involvement Pertinent to Current Situation/Hospitalization: No - Comment as needed  Activities of Daily Living      Permission Sought/Granted Permission sought to share information with : Facility  Sport and exercise psychologist, Family Supports Permission granted to share information with : Yes, Verbal Permission Granted  Share Information with NAME: Chelsea Fuller  Permission granted to share info w AGENCY: SNF  Permission granted to share info w Relationship: Daughter     Emotional Assessment   Attitude/Demeanor/Rapport: Unable to Assess Affect (typically observed): Unable to Assess Orientation: : Oriented to Self Alcohol / Substance Use: Not Applicable Psych Involvement: No (comment)  Admission diagnosis:  Aphasia [R47.01] Slurred speech [R47.81] Elevated blood pressure reading [R03.0] Patient Active Problem List   Diagnosis Date Noted   Slurred speech 07/24/2022   UTI (urinary tract infection) 07/23/2022   AKI (acute kidney injury) ([place]) 07/23/2022   Acute encephalopathy 07/22/2022   Aphasia    Dyspnea 09/30/2018   PICC (peripherally inserted central catheter) in place 11/28/2017   Abnormal LFTs 11/08/2017   Thrombocytopenia ([place]) 11/08/2017   Normocytic anemia 11/08/2017   Elevated AST (SGOT) 11/07/2017   MRSA bacteremia 11/05/2017   Weakness generalized 11/04/2017   Acute cystitis without hematuria    Ischemic cardiomyopathy 10/30/2017   CAD (coronary artery disease)-DES/LAD 2017 10/30/2017   Hypoxia 10/30/2017   Acute on chronic systolic heart failure ([place]) 10/30/2017   CKD (chronic kidney disease) stage 3, GFR 30-59 ml/min (HCC) 10/30/2017   Fever 10/30/2017   Accelerated hypertension 09/24/2016   Acute on chronic diastolic CHF (congestive heart failure) ([place]) 09/23/2016   Old anterior ST elevation MI    Chronic atrial fibrillation  Epistaxis 10/04/2013   Anemia 10/04/2013   Nasal fracture 10/04/2013   History of CVA (cerebrovascular accident) 10/04/2013   HTN (hypertension) 10/04/2013   Hypothyroidism 10/04/2013   PCP:  Nicoletta Dress, MD Pharmacy:   [id number] [place], [place] - [address] [place] [address] Phone:  [phone] Fax: [phone]  [facility] [phone] - Closed - [place], [place] - [address] AT [facility] [address] [place] [address] Phone: [phone] Fax: [phone]     Social Determinants of Health (SDOH) Interventions    Readmission Risk Interventions     No data to display

## 2022-07-25 DIAGNOSIS — R451 Restlessness and agitation: Secondary | ICD-10-CM | POA: Diagnosis not present

## 2022-07-25 DIAGNOSIS — F05 Delirium due to known physiological condition: Secondary | ICD-10-CM | POA: Diagnosis not present

## 2022-07-25 DIAGNOSIS — N390 Urinary tract infection, site not specified: Secondary | ICD-10-CM | POA: Diagnosis not present

## 2022-07-25 DIAGNOSIS — G9340 Encephalopathy, unspecified: Secondary | ICD-10-CM | POA: Diagnosis not present

## 2022-07-25 DIAGNOSIS — G928 Other toxic encephalopathy: Secondary | ICD-10-CM | POA: Diagnosis not present

## 2022-07-25 LAB — BASIC METABOLIC PANEL
Anion gap: 18 — ABNORMAL HIGH (ref 5–15)
BUN: 28 mg/dL — ABNORMAL HIGH (ref 8–23)
CO2: 22 mmol/L (ref 22–32)
Calcium: 9.4 mg/dL (ref 8.9–10.3)
Chloride: 107 mmol/L (ref 98–111)
Creatinine, Ser: 1.65 mg/dL — ABNORMAL HIGH (ref 0.44–1.00)
GFR, Estimated: 28 mL/min — ABNORMAL LOW (ref >60)
Glucose, Bld: 129 mg/dL — ABNORMAL HIGH (ref 70–99)
Potassium: 4.5 mmol/L (ref 3.5–5.1)
Sodium: 147 mmol/L — ABNORMAL HIGH (ref 135–145)

## 2022-07-25 LAB — GLUCOSE, CAPILLARY
Glucose-Capillary: 124 mg/dL — ABNORMAL HIGH (ref 70–99)
Glucose-Capillary: 155 mg/dL — ABNORMAL HIGH (ref 70–99)
Glucose-Capillary: 125 mg/dL — ABNORMAL HIGH (ref 70–99)
Glucose-Capillary: 76 mg/dL (ref 70–99)

## 2022-07-25 LAB — VITAMIN B1: Vitamin B1 (Thiamine): 79.3 nmol/L (ref 66.5–200.0)

## 2022-07-25 LAB — MRSA NEXT GEN BY PCR, NASAL: MRSA by PCR Next Gen: NOT DETECTED

## 2022-07-25 MED ORDER — QUETIAPINE FUMARATE 25 MG PO TABS: 25.0000 mg | ORAL_TABLET | Freq: Every day | ORAL | Status: DC

## 2022-07-25 MED ORDER — QUETIAPINE FUMARATE 25 MG PO TABS
25.0000 mg | ORAL_TABLET | ORAL | Status: DC | PRN
  Administered 2022-07-25: 25 mg via ORAL
  Filled 2022-07-25: qty 1

## 2022-07-25 NOTE — TOC Progression Note (Signed)
Transition of Care [facility]) - Progression Note    Patient Details  Name: Chelsea Fuller MRN: [id number] Date of Birth: 08-21-1924  Transition of Care [facility]) CM/SW [place], [place] Phone Number: 07/25/2022, 2:56 PM  Clinical Narrative:   CSW met with patient and daughter at bedside, patient in much better spirits today and sitting up in the chair. CSW explained that referral for SNF will be faxed out when patient's mental status clears and is doing better, daughter in agreement. Hopeful that can happen tomorrow if patient continues to do well. CSW to follow.    Expected Discharge Plan: [place] Barriers to Discharge: Continued Medical Work up, Ship broker  Expected Discharge Plan and Services Expected Discharge Plan: [place] Choice: [place] arrangements for the past 2 months: Single Family Home                                       Social Determinants of Health (SDOH) Interventions    Readmission Risk Interventions     No data to display

## 2022-07-25 NOTE — Progress Notes (Addendum)
Subjective:   Summary: Chelsea Fuller is a 88 y.o. year old female currently admitted on the IMTS HD#1 for toxic metabolic encephalopathy.  Overnight Events: Patient had one episode of agitation and was given Haldol.  Patient was seen this a.m. bedside.  She was noticeably much more awake, and interactive she was able to say her name, but did not know she was in the hospital.  Daughter was bedside, and she states that she looks much better than upon admission.  Objective:  Vital signs in last 24 hours: Vitals:   07/24/22 2326 07/25/22 0451 07/25/22 0730 07/25/22 1116  BP: (!) 173/70 (!) 153/98 (!) 169/78 (!) 154/73  Pulse:  80 74 (!) 41  Resp: 20 (!) 21 17 17   Temp: 98.8 F (37.1 C) 98.1 F (36.7 C) 98.3 F (36.8 C) 98.4 F (36.9 C)  TempSrc: Axillary Axillary Oral Oral  SpO2: 98% 98%  100%  Weight:       Supplemental O2: Nasal Cannula SpO2: 100 % O2 Flow Rate (L/min): 2 L/min   Physical Exam:  Constitutional: Elderly woman, slightly disoriented, in no acute distress  Cardiovascular: RRR, no murmurs, rubs or gallops Pulmonary/Chest: normal work of breathing on room air, lungs clear to auscultation bilaterally Abdominal: soft, non-tender, non-distended Skin: warm and dry Extremities: upper/lower extremity pulses 2+, no lower extremity edema present  [facility] Weights   07/23/22 2031  Weight: 57.6 kg     Intake/Output Summary (Last 24 hours) at 07/25/2022 1402 Last data filed at 07/25/2022 1200 Gross per 24 hour  Intake 760 ml  Output 800 ml  Net -40 ml   Net IO Since Admission: -290 mL [07/25/22 1402]  Pertinent Labs:    Latest Ref Rng & Units 07/23/2022    2:15 AM 07/22/2022    1:33 PM 07/22/2022    1:32 PM  CBC  WBC 4.0 - 10.5 K/uL 6.9   4.4   Hemoglobin 12.0 - 15.0 g/dL 12.7  12.9  12.8   Hematocrit 36.0 - 46.0 % 40.5  38.0  40.0   Platelets 150 - 400 K/uL 139   144        Latest Ref Rng & Units 07/25/2022    6:11 AM 07/24/2022     5:28 AM 07/23/2022    2:15 AM  CMP  Glucose 70 - 99 mg/dL 129  113  111   BUN 8 - 23 mg/dL 28  19  18    Creatinine 0.44 - 1.00 mg/dL 1.65  1.44  1.51   Sodium 135 - 145 mmol/L 147  145  140   Potassium 3.5 - 5.1 mmol/L 4.5  4.8  4.1   Chloride 98 - 111 mmol/L 107  106  104   CO2 22 - 32 mmol/L 22  25  25    Calcium 8.9 - 10.3 mg/dL 9.4  9.1  8.8     Assessment/Plan:   Principal Problem:   Acute encephalopathy Active Problems:   UTI (urinary tract infection)   AKI (acute kidney injury) ([place])   Slurred speech   Patient Summary: Chelsea Fuller is a 88 y.o. with a pertinent PMH of permanent atrial fibrillation on Eliquis, CVA without residual deficits, coronary artery disease with STEMI in 2017 status post DES x2 to the LAD, hypertension, CKD, who presented with acute encephalopathy and admitted for toxic metabolic encephalopathy secondary to unresolved UTI.    #Multifactorial Toxic  Metabolic Encephalopathy  #Possible UTI #Vitamin B12 Deficiency  #Poor PO intake Toxic encephalopathy is likely multifactorial in nature, might be due to current untreated UTI as well as poor p.o. intake for the last year or so which may have led to dehydration.  Unsure the exact cause however we will continue treating both of these.  She is currently on Rocephin day 3.  B12 was repleted yesterday (neurology has recommended to keep B12 levels above 400 in the setting of an elderly patient).  B1 values are currently pending.  On exam today, patient was much more awake, and interactive.  She was able to follow commands, and answer most questions.  She is AAO x1.  Plan: - Continue ceftriaxone - Follow-up thiamine result -Encourage p.o. fluid intake -Appreciate neurology recommendations  #Pulmonary edema in the setting of iatrogenic fluid resuscitation on admission Chest x-ray yesterday revealed pulmonary edema, likely in the setting of iatrogenic fluids at the time of admission patient is being given  Lasix 40 mg IV, and current output is -810.  #Agitation/sundowning Haldol is currently being used for agitation management.  We will avoid using Ativan as per neurology recommendations.  Plan: - Patient is able to tolerate PO intake now, so as per neurology recommendations we will D/C haloperidol and resume seroquel 25mg  at nightitme, as well as seroquel PRN for agitation.    #Permanent A-fib Patient has history of A-fib has been controlled with Coreg 3.125, and anticoagulated with Eliquis 5 mg.  She has been able to tolerate taking p.o. medications now.   Code: DNR    Dispo: Anticipated discharge in less than 2 midnights. Drucie Opitz, MD PGY-1 Internal Medicine Resident Pager Number [phone] Please contact the on call pager after 5 pm and on weekends at [phone].

## 2022-07-25 NOTE — Progress Notes (Signed)
Pt agitated, screaming, trying to hit, kick, and bite staff. Will attempt to give medication once pt had calmed down, will continue to monitor closely.

## 2022-07-25 NOTE — Progress Notes (Signed)
10/3 2111 pt is agitated, screaming, pulling her mittens and trying to get up in the bed. PRN Haldol given. Was able to calm and sleep in a while.  At 2330 pt is calm and requested for water. Was able to take her Eliquis with apple sauce and had a sip of water.  10/4 0049 pt becomes agitated again. PRN Haldol given. On call MD updated. This RN stayed in the room for a moment and played calming sounds for sleep.   0200 pt asleep.  0430 pt is asleep, calm, easy arousable.  Will continue to monitor.

## 2022-07-25 NOTE — Progress Notes (Addendum)
Neurology Progress Note  Brief HPI: Chelsea Fuller is a 88yo F h/o CHF, CAD, STEMI, Afib on Eliquis, prior L MCA infarct presenting from home with concern for stroke. LKW was 11am when she woke up then when she was eating breakfast around 1130am, home health aide noted patient was dripping food from the R side of her mouth. Recently had fall 2wks ago which required increasing assistance for ADLs, also received outpatient treatment for known UTI.  Collateral from family: They report that she was getting ready to be placed in a assisted living facility.  Walking using a rollator but requiring more and more help with ADLs to the point that she requires 24/7 assistance. Has had a gradual decline in her mentation and cognition for 2 years.  This was exacerbated over the last 2 weeks after she had a fall after which she had a more difficult time with her gait as well as memory.  Subjective: Patient was agitated overnight, received 2 doses of Haldol overnight, last given at midnight. She said "I slept so heavy" when waking her up this morning and felt that "Its time to get out of bed." Much more conversational and pleasant today. Notes pain in her legs.  Exam: Vitals:   07/25/22 0451 07/25/22 0730  BP: (!) 153/98 (!) 169/78  Pulse: 80 74  Resp: (!) 21 17  Temp: 98.1 F (36.7 C) 98.3 F (36.8 C)  SpO2: 98%    Gen: In bed, NAD, mitts in place on bilateral upper extremities HENT: Dry mucous membranes. Resp: non-labored breathing, no acute distress Abd: soft, nt Skin: Patchy deep red bruising noted on LLE around the ankles  Neuro: Mental Status: Patient is only oriented to self, believes the year is in the 1900s, and said she came here from "business week" Cranial Nerves: Pupils equal round reactive light, EOMI, no facial droop noted at rest or with smile. Tongue/uvula/soft palate midline, no tongue fasciculations. Motor: Able to move all extremities, antigravity without drift. Mild fine tremor  noted in bilateral upper extremities. Coordination: No dysmetria. Gait: Deferred.  Pertinent Labs: Lab Results  Component Value Date   WBC 6.9 07/23/2022   HGB 12.7 07/23/2022   HCT 40.5 07/23/2022   MCV 98.3 07/23/2022   PLT 139 (L) 07/23/2022       Latest Ref Rng & Units 07/25/2022    6:11 AM 07/24/2022    5:28 AM 07/23/2022    2:15 AM  BMP  Glucose 70 - 99 mg/dL 129  113  111   BUN 8 - 23 mg/dL 28  19  18    Creatinine 0.44 - 1.00 mg/dL 1.65  1.44  1.51   Sodium 135 - 145 mmol/L 147  145  140   Potassium 3.5 - 5.1 mmol/L 4.5  4.8  4.1   Chloride 98 - 111 mmol/L 107  106  104   CO2 22 - 32 mmol/L 22  25  25    Calcium 8.9 - 10.3 mg/dL 9.4  9.1  8.8     UA 07/22/2022: Moderate WBCs, Urine culture 10,000 CFUs w/ multiple species.  Repeat UA 07/23/2022 shows some ketones, proteinuria, rare bacteria, repeat urine culture in process.  Blood Culture #1 & 2: No growth. Lactic Acid 1.5, 1.6  TSH WNL, T3 marginally decreased, free T4 marginally increased Vitamin B12 although within normal limits, low for neurological standards at 202, RPR negative.  Imaging I have reviewed the images obtained:   CT-scan of the brain 10/1: No evidence of  acute intracranial abnormality. Parenchymal atrophy, chronic small vessel disease and chronic infarcts as described. Paranasal sinus disease, as outlined. 18 mm polypoid soft tissue focus within the left nasal passage. Direct visualization recommended.   CTA head and neck w/wo 10/1: CTA neck:   1. The common carotid, internal carotid and vertebral arteries are patent within the neck without hemodynamically significant stenosis. Atherosclerotic plaque, as described. 2.  Aortic Atherosclerosis (ICD10-I70.0).   CTA head:   1. No intracranial large vessel occlusion is identified. 2. Intracranial atherosclerotic disease, most notably as follows. 3. Sites of moderate stenosis within multiple proximal M2 left MCA vessels. 4. Severe stenosis within  the right PCA at the P2/P3 junction.  MRI brain w/o Contrast (07/22/2022): 1. Intermittently motion degraded examination, as described. 2. No evidence of acute intracranial abnormality. Specifically, the diffusion-weighted imaging is of good quality and there is no evidence of acute infarct. 3. Parenchymal atrophy, chronic small vessel ischemic disease and chronic infarcts as described. 4. Paranasal sinus disease, as outlined. 5. 18 mm polypoid soft tissue focus within the left nasal passage. Direct visualization recommended.  Neurodiagnostics Routine EEG 07/22/2022-essentially nondiagnostic due to excessive EMG artifact. Repeat EEG 07/23/2022 - generalized slowing suggestive of diffuse encephalopathy, but still with significant EMG and movement artifact.  Assessment: 88yo F h/o CHF, CAD, STEMI, Afib on Eliquis, prior L MCA infarct presents for evaluation of confusion and right sided facial droop. UA positive for WBCs with known UTI prior to admission and finishing day 4/5 on Rocephin here. CT head, CTA head/neck, and MRI brain demonstrate no acute intracranial abnormality. Initial EEG rather nondiagnostic due to artifact. Blood work so far ultimately unremarkable for any signs of acute infection or metabolic derangements.  Repeat EEG also with artifact but no evidence of ongoing status epilepticus. She seems to tolerate PRN Haldol much better than Ativan. Affect now seems to have reverted back to initial presentation.  Etiologies for this patient's encephalopathy include progression of dementia, delirium, toxic metabolic encephalopathy or recrudescence of old stroke in the setting of underlying partially treated UTI, or underlying seizures.  Recommendations: - Continue treatment for UTI - primary team managing with Rocephin. - Continue thiamine supplementation - Management of the incidental left nasal passage polypoid tissue finding per primary team. -Replete B12 for a level greater than  400 - Avoid Ativan   Portions of the patient encounter and this note were written and completed by Wilhemina Cash, PA-S. Full patient evaluation and verification of findings were completed by the attending provider, Dr. Rory Percy  Attending Neurohospitalist Addendum Patient seen and examined with APP/Resident. Agree with the history and physical as documented above. Agree with the plan as documented, which I helped formulate. I have independently reviewed the chart, obtained history, review of systems and examined the patient.I have personally reviewed pertinent head/neck/spine imaging (CT/MRI).    Doing much better today, more awake, more cooperative with the exam.  Still mildly encephalopathic.  Likely all secondary to dehydration and UTI.  Continue management of dehydration and UTI as you are.  Avoid sedating medications especially giving IV Ativan's.  Consider using Seroquel if needed for nightly agitation.  Replete B12 for levels greater than 400.  Plan was relayed to the primary team resident physician via secure chat.  Please call neurology with questions as needed.   Please feel free to call with any questions.  -- Amie Portland, MD Neurologist [facility] Pager: [phone]

## 2022-07-26 DIAGNOSIS — G928 Other toxic encephalopathy: Secondary | ICD-10-CM | POA: Diagnosis not present

## 2022-07-26 LAB — GLUCOSE, CAPILLARY
Glucose-Capillary: 95 mg/dL (ref 70–99)
Glucose-Capillary: 196 mg/dL — ABNORMAL HIGH (ref 70–99)
Glucose-Capillary: 96 mg/dL (ref 70–99)
Glucose-Capillary: 99 mg/dL (ref 70–99)

## 2022-07-26 LAB — BASIC METABOLIC PANEL
Anion gap: 8 (ref 5–15)
BUN: 30 mg/dL — ABNORMAL HIGH (ref 8–23)
CO2: 27 mmol/L (ref 22–32)
Calcium: 8.5 mg/dL — ABNORMAL LOW (ref 8.9–10.3)
Chloride: 105 mmol/L (ref 98–111)
Creatinine, Ser: 1.55 mg/dL — ABNORMAL HIGH (ref 0.44–1.00)
GFR, Estimated: 31 mL/min — ABNORMAL LOW (ref >60)
Glucose, Bld: 91 mg/dL (ref 70–99)
Potassium: 3.6 mmol/L (ref 3.5–5.1)
Sodium: 140 mmol/L (ref 135–145)

## 2022-07-26 MED ORDER — ORAL CARE MOUTH RINSE: 15.0000 mL | OROMUCOSAL | Status: DC | PRN

## 2022-07-26 MED ORDER — QUETIAPINE FUMARATE 50 MG PO TABS
50.0000 mg | ORAL_TABLET | ORAL | Status: DC | PRN
  Administered 2022-07-26: 50 mg via ORAL
  Filled 2022-07-26: qty 1

## 2022-07-26 MED ORDER — QUETIAPINE FUMARATE 50 MG PO TABS
50.0000 mg | ORAL_TABLET | Freq: Every day | ORAL | Status: DC
  Administered 2022-07-26: 50 mg via ORAL
  Filled 2022-07-26: qty 1

## 2022-07-26 NOTE — Progress Notes (Addendum)
Subjective:   Summary: Chelsea Fuller is a 88 y.o. year old female currently admitted on the IMTS HD#2 for toxic metabolic encephalopathy.  Overnight Events: Patient was again agitated and combative overnight  Patient was seen this a.m. bedside with daughter at bedside as well.  She was much more cohesive, and was able to respond to most of my questions and was also very responsive to commands as well.  She was oriented to person, place (was able to say that she was in Icard).  Objective:  Vital signs in last 24 hours: Vitals:   07/25/22 1955 07/25/22 2349 07/26/22 0357 07/26/22 0825  BP: (!) 123/49 (!) 136/42 (!) 162/45 (!) 152/97  Pulse: 64 67 73 85  Resp: 17 17 18 18   Temp: 99.1 F (37.3 C) 98.4 F (36.9 C)  97.8 F (36.6 C)  TempSrc: Oral Oral  Oral  SpO2: 100%   98%  Weight:       Supplemental O2: Nasal Cannula SpO2: 98 % O2 Flow Rate (L/min): 2 L/min   Physical Exam:  Constitutional: Elderly female slightly disorientated, in no acute distress  Cardiovascular: RRR, no murmurs, rubs or gallops Pulmonary/Chest: normal work of breathing on room air, lungs clear to auscultation bilaterally Abdominal: soft, non-tender, non-distended Skin: warm and dry Extremities: upper/lower extremity pulses 2+, no lower extremity edema present  [facility] Weights   07/23/22 2031  Weight: 57.6 kg    No intake or output data in the 24 hours ending 07/26/22 1344 Net IO Since Admission: -290 mL [07/26/22 1344]  Pertinent Labs:    Latest Ref Rng & Units 07/23/2022    2:15 AM 07/22/2022    1:33 PM 07/22/2022    1:32 PM  CBC  WBC 4.0 - 10.5 K/uL 6.9   4.4   Hemoglobin 12.0 - 15.0 g/dL 12.7  12.9  12.8   Hematocrit 36.0 - 46.0 % 40.5  38.0  40.0   Platelets 150 - 400 K/uL 139   144        Latest Ref Rng & Units 07/26/2022    3:01 AM 07/25/2022    6:11 AM 07/24/2022    5:28 AM  CMP  Glucose 70 - 99 mg/dL 91  129  113   BUN 8 - 23 mg/dL 30  28  19     Creatinine 0.44 - 1.00 mg/dL 1.55  1.65  1.44   Sodium 135 - 145 mmol/L 140  147  145   Potassium 3.5 - 5.1 mmol/L 3.6  4.5  4.8   Chloride 98 - 111 mmol/L 105  107  106   CO2 22 - 32 mmol/L 27  22  25    Calcium 8.9 - 10.3 mg/dL 8.5  9.4  9.1       Assessment/Plan:   Principal Problem:   Acute encephalopathy Active Problems:   UTI (urinary tract infection)   AKI (acute kidney injury) ([place])   Slurred speech   Patient Summary: Chelsea Fuller is a 88 y.o. with a pertinent PMH  permanent atrial fibrillation on Eliquis, CVA without residual deficits, coronary artery disease with STEMI in 2017 status post DES x2 to the LAD, hypertension, CKD, who presented with acute encephalopathy and admitted for toxic metabolic encephalopathy secondary to unresolved UTI.   #Multifactorial toxic metabolic encephalopathy #Possible UTI #Vitamin B12 and thiamine inadequacy #Poor p.o. intake Metabolic encephalopathy most likely multifactorial in nature, may be  due to untreated UTI as well as poor p.o. intake for the last year which may have led to dehydration and necessary vitamin deficiency.  Vitamin B12 was checked and it was low normal, however per neurology recommendations we will keep it above 400, so B12 infusion was given.  Thiamine levels were also in the low normal, at 79.31 lowest normal value at 66.  We will start a thiamine infusion for her as well.  On exam, patient was much more interactive and was able to follow commands and answer questions.  She is AAO [employer].  Plan: - Continue ceftriaxone, transition to augment to complete a 7 day course  - Continue thiamine infusions - Encourage p.o. fluid intake - Appreciate neurology recommendations  #Agitation/sundowning Patient had multiple episodes to be combative with the nurses overnight.  We have tried using Seroquel 25 mg, however does not seem to be working.  Plans were for potential discharge today, however due to patient's combativeness  SNF will not accept.  Will see how she does tonight, and reevaluate in the morning.  Plan: - Scheduled Seroquel for 6 PM, and up the dose to 50 mg - Seroquel 50 mg as needed for agitation  #Permanent A-fib Patient has history of A-fib that has been controlled with Coreg 3.125, and anticoagulated with Eliquis 5 mg.  She has been able to tolerate taking p.o. medications now.  #Pulmonary edema in the setting of iatrogenic fluid resuscitation on admission (resolved) Noticed a creatinine bump yesterday, which could be an indication that the patient is dry.  Creatinine went back down to what seems to be around her baseline this AM.  She is not complaining of any respiratory distress, and on exam was not using accessory muscles.     Code: DNR   Dispo: Anticipated discharge in less than 2 midnights Drucie Opitz, MD PGY-1 Internal Medicine Resident Pager Number [phone] Please contact the on call pager after 5 pm and on weekends at [phone].

## 2022-07-26 NOTE — Progress Notes (Signed)
Pt refusing BG reading this morning, becomes combative and yell's "get out of here" repeatedly. Will get day shift to attempt a little later.

## 2022-07-27 DIAGNOSIS — N390 Urinary tract infection, site not specified: Secondary | ICD-10-CM | POA: Diagnosis not present

## 2022-07-27 DIAGNOSIS — G928 Other toxic encephalopathy: Secondary | ICD-10-CM | POA: Diagnosis not present

## 2022-07-27 LAB — BASIC METABOLIC PANEL
Anion gap: 13 (ref 5–15)
BUN: 25 mg/dL — ABNORMAL HIGH (ref 8–23)
CO2: 22 mmol/L (ref 22–32)
Calcium: 8.3 mg/dL — ABNORMAL LOW (ref 8.9–10.3)
Chloride: 106 mmol/L (ref 98–111)
Creatinine, Ser: 1.34 mg/dL — ABNORMAL HIGH (ref 0.44–1.00)
GFR, Estimated: 37 mL/min — ABNORMAL LOW (ref >60)
Glucose, Bld: 96 mg/dL (ref 70–99)
Potassium: 3.4 mmol/L — ABNORMAL LOW (ref 3.5–5.1)
Sodium: 141 mmol/L (ref 135–145)

## 2022-07-27 LAB — CULTURE, BLOOD (ROUTINE X 2): Culture: NO GROWTH

## 2022-07-27 LAB — GLUCOSE, CAPILLARY
Glucose-Capillary: 99 mg/dL (ref 70–99)
Glucose-Capillary: 205 mg/dL — ABNORMAL HIGH (ref 70–99)
Glucose-Capillary: 140 mg/dL — ABNORMAL HIGH (ref 70–99)

## 2022-07-27 MED ORDER — AMOXICILLIN-POT CLAVULANATE 500-125 MG PO TABS
1.0000 | ORAL_TABLET | Freq: Two times a day (BID) | ORAL | Status: AC
  Administered 2022-07-27 – 2022-07-29 (×5): 500 mg via ORAL
  Administered 2022-07-30 (×2): 1 via ORAL
  Filled 2022-07-27 (×8): qty 1

## 2022-07-27 MED ORDER — AMOXICILLIN-POT CLAVULANATE 500-125 MG PO TABS
1.0000 | ORAL_TABLET | Freq: Two times a day (BID) | ORAL | Status: DC
  Filled 2022-07-27: qty 1

## 2022-07-27 MED ORDER — QUETIAPINE FUMARATE 100 MG PO TABS
100.0000 mg | ORAL_TABLET | Freq: Every day | ORAL | Status: DC
  Administered 2022-07-27 – 2022-07-31 (×5): 100 mg via ORAL
  Filled 2022-07-27 (×5): qty 1

## 2022-07-27 MED ORDER — QUETIAPINE FUMARATE 100 MG PO TABS: 100.0000 mg | ORAL_TABLET | ORAL | Status: DC | PRN

## 2022-07-27 NOTE — Progress Notes (Signed)
Physical Therapy Treatment Patient Details Name: Chelsea Fuller MRN: [id number] DOB: 1924/10/19 Today's Date: 07/27/2022   History of Present Illness Pt is a 88 y/o female who presented with slurred speech and AMS. MRI negative for acute infarct. EEG pending. PMH: a fib, CVA, CAD with STEMI, HTN, CKDIII    PT Comments    Progressing towards functional goals. Pleasant and cooperative but confused, not oriented at all, asks where her father currently is. Patient tolerated seated balance challenges, transfer training (min assist), and gait training in room (up to mod assist) with posterior and left lateral loss of balance fairly frequent. Pt back in bed, call bell in place, visitor with patient. Patient will continue to benefit from skilled physical therapy services to further improve independence with functional mobility.   Recommendations for follow up therapy are one component of a multi-disciplinary discharge planning process, led by the attending physician.  Recommendations may be updated based on patient status, additional functional criteria and insurance authorization.  Follow Up Recommendations  Skilled nursing-short term rehab (<3 hours/day) Can patient physically be transported by private vehicle: Yes   Assistance Recommended at Discharge Frequent or constant Supervision/Assistance  Patient can return home with the following Assistance with cooking/housework;Help with stairs or ramp for entrance;Assist for transportation;Direct supervision/assist for financial management;Direct supervision/assist for medications management;A lot of help with walking and/or transfers;A lot of help with bathing/dressing/bathroom   Equipment Recommendations  None recommended by PT    Recommendations for Other Services       Precautions / Restrictions Precautions Precautions: Fall Restrictions Weight Bearing Restrictions: No     Mobility  Bed Mobility Overal bed mobility: Needs  Assistance Bed Mobility: Supine to Sit     Supine to sit: Min assist     General bed mobility comments: Min assist to facilitate sequencing to EOB. Pt able to initiate had some posterior instability once upright requiring assist while scooting but then stable once square on EOB.    Transfers Overall transfer level: Needs assistance Equipment used: Rolling walker (2 wheels) Transfers: Sit to/from Stand Sit to Stand: Min assist           General transfer comment: Min assist for boost and to keep weight forward onto RW due to posterior LOB. Performed from low bed setting x2.    Ambulation/Gait Ambulation/Gait assistance: Mod assist Gait Distance (Feet): 18 Feet (x2) Assistive device: Rolling walker (2 wheels) Gait Pattern/deviations: Step-through pattern, Decreased stride length, Knee flexed in stance - right, Shuffle, Scissoring, Ataxic, Leaning posteriorly, Staggering left Gait velocity: slow Gait velocity interpretation: <1.31 ft/sec, indicative of household ambulator Pre-gait activities: Heel/toe rocks, march in place. General Gait Details: Frequent assist for leftward and posterior LOB, occurring more frequent during turns. Required up to mod assist at times to correct balance. Fair RW control but occasionally bumping into objects in room. Educated on RW use and proximity with VC and tactile cues to demonstrate.   Stairs             Wheelchair Mobility    Modified Rankin (Stroke Patients Only)       Balance Overall balance assessment: Needs assistance Sitting-balance support: No upper extremity supported, Feet supported Sitting balance-Leahy Scale: Fair Sitting balance - Comments: After one initial episode of posterior LOB, pt able to self correct while sitting EOB. Participated in forward and lateral reaching ipsilateral and across chest for LOS challenge.   Standing balance support: Bilateral upper extremity supported, Reliant on assistive device for  balance Standing balance-Leahy  Scale: Poor                              Cognition Arousal/Alertness: Awake/alert Behavior During Therapy: Flat affect Overall Cognitive Status: No family/caregiver present to determine baseline cognitive functioning                                 General Comments: Disoriented yet pleasant. Asks if we can find her father, that he went to the store. States she walked to lunch (did not do this.)        Exercises      General Comments        Pertinent Vitals/Pain Pain Assessment Pain Assessment: No/denies pain Pain Intervention(s): Monitored during session    Home Living                          Prior Function            PT Goals (current goals can now be found in the care plan section) Acute Rehab PT Goals Patient Stated Goal: none PT Goal Formulation: Patient unable to participate in goal setting Time For Goal Achievement: 08/06/22 Potential to Achieve Goals: Fair Progress towards PT goals: Progressing toward goals    Frequency    Min 2X/week      PT Plan Current plan remains appropriate    Co-evaluation PT/OT/SLP Co-Evaluation/Treatment: Yes            AM-PAC PT "6 Clicks" Mobility   Outcome Measure  Help needed turning from your back to your side while in a flat bed without using bedrails?: A Little Help needed moving from lying on your back to sitting on the side of a flat bed without using bedrails?: A Little Help needed moving to and from a bed to a chair (including a wheelchair)?: A Lot Help needed standing up from a chair using your arms (e.g., wheelchair or bedside chair)?: A Lot Help needed to walk in hospital room?: A Lot Help needed climbing 3-5 steps with a railing? : Total 6 Click Score: 13    End of Session Equipment Utilized During Treatment: Gait belt Activity Tolerance: Patient tolerated treatment well Patient left: in bed;with call bell/phone within reach;with  bed alarm set;with family/visitor present Nurse Communication: Mobility status PT Visit Diagnosis: Unsteadiness on feet (R26.81);Muscle weakness (generalized) (M62.81);Difficulty in walking, not elsewhere classified (R26.2)     Time: 5462-7035 PT Time Calculation (min) (ACUTE ONLY): 19 min  Charges:  $Gait Training: 8-22 mins                     Candie Mile, PT, DPT Physical Therapist Acute Rehabilitation Services [place] [facility]    Ellouise Newer 07/27/2022, 3:41 PM

## 2022-07-27 NOTE — TOC Progression Note (Signed)
Transition of Care [facility]) - Progression Note    Patient Details  Name: NICKIA BOESEN MRN: [id number] Date of Birth: 1924-08-13  [facility]) CM/SW [place], [place] Phone Number: 07/27/2022, 4:20 PM  Clinical Narrative:   CSW asked by MD about progress with SNF, and CSW explained that patient will need to have more than one good day before discharge to SNF. Telesitter discontinued this morning as patient is doing better, had a good night last night. CSW faxed out referral, but no bed offers as of yet. CSW contacted [facility], family's preference, and they want to see how patient does over the weekend before making bed offer. CSW spoke with daughter, Ilona Sorrel, to provide update and she is in agreement. CSW to follow.    Expected Discharge Plan: [place] Barriers to Discharge: Continued Medical Work up, Ship broker  Expected Discharge Plan and Services Expected Discharge Plan: [place] Choice: [place] arrangements for the past 2 months: Single Family Home                                       Social Determinants of Health (SDOH) Interventions    Readmission Risk Interventions     No data to display

## 2022-07-27 NOTE — Care Management Important Message (Signed)
Important Message  Patient Details  Name: Chelsea Fuller MRN: [id number] Date of Birth: [birth year]   Medicare Important Message Given:  Yes     Orbie Pyo 07/27/2022, 2:59 PM

## 2022-07-27 NOTE — Progress Notes (Signed)
Tele monitoring called to report second degree type ii with ventricular Bigeminy.   Patient with history of atrial fibrillation and bigeminy on review of tele monitoring. 12 lead EKG performed and 1st call resident paged.

## 2022-07-27 NOTE — Progress Notes (Signed)
Subjective:   Summary: Chelsea Fuller is a 88 y.o. year old female currently admitted on the IMTS HD#3 for toxic metabolic encephalopathy.  Overnight Events: No overnight events  Patient was seen this a.m. bedside.  She has improved greatly from her first state on admission.  Mental status seems to be doing a lot better.  She is coherent, able to hold conversations and able to follow commands  Objective:  Vital signs in last 24 hours: Vitals:   07/26/22 2021 07/27/22 0040 07/27/22 0419 07/27/22 0726  BP: (!) 166/80 (!) 151/67 (!) 149/58 (!) 157/119  Pulse: 60 78 72 85  Resp: 15 17 13 18   Temp: 97.9 F (36.6 C) 97.7 F (36.5 C) 97.9 F (36.6 C) 98.6 F (37 C)  TempSrc: Oral Oral Oral Oral  SpO2: 95% 94% 92% 96%  Weight:       Supplemental O2: Room Air SpO2: 96 % O2 Flow Rate (L/min): 2 L/min   Physical Exam:  Constitutional: Elderly female, in no acute distress Cardiovascular: RRR, no murmurs, rubs or gallops Pulmonary/Chest: normal work of breathing on room air, lungs clear to auscultation bilaterally Abdominal: soft, non-tender, non-distended Skin: warm and dry Extremities: upper/lower extremity pulses 2+, no lower extremity edema present  [facility] Weights   07/23/22 2031  Weight: 57.6 kg     Intake/Output Summary (Last 24 hours) at 07/27/2022 1334 Last data filed at 07/26/2022 1721 Gross per 24 hour  Intake --  Output 600 ml  Net -600 ml   Net IO Since Admission: -890 mL [07/27/22 1334]  Pertinent Labs:    Latest Ref Rng & Units 07/23/2022    2:15 AM 07/22/2022    1:33 PM 07/22/2022    1:32 PM  CBC  WBC 4.0 - 10.5 K/uL 6.9   4.4   Hemoglobin 12.0 - 15.0 g/dL 12.7  12.9  12.8   Hematocrit 36.0 - 46.0 % 40.5  38.0  40.0   Platelets 150 - 400 K/uL 139   144        Latest Ref Rng & Units 07/27/2022    6:59 AM 07/26/2022    3:01 AM 07/25/2022    6:11 AM  CMP  Glucose 70 - 99 mg/dL 96  91  129   BUN 8 - 23 mg/dL 25  30  28     Creatinine 0.44 - 1.00 mg/dL 1.34  1.55  1.65   Sodium 135 - 145 mmol/L 141  140  147   Potassium 3.5 - 5.1 mmol/L 3.4  3.6  4.5   Chloride 98 - 111 mmol/L 106  105  107   CO2 22 - 32 mmol/L 22  27  22    Calcium 8.9 - 10.3 mg/dL 8.3  8.5  9.4    Assessment/Plan:   Principal Problem:   Acute encephalopathy Active Problems:   UTI (urinary tract infection)   AKI (acute kidney injury) ([place])   Slurred speech   Patient Summary: Chelsea Fuller is a 88 y.o. with a pertinent PMH  permanent atrial fibrillation on Eliquis, CVA without residual deficits, coronary artery disease with STEMI in 2017 status post DES x2 to the LAD, hypertension, CKD, who presented with acute encephalopathy and admitted for toxic metabolic encephalopathy secondary to unresolved UTI.    #Multifactorial toxic metabolic encephalopathy #UTI #Vitamin B12 and thiamine inadequacy #Poor p.o. intake Metabolic encephalopathy was most likely multifactorial in nature, likely  due to untreated UTI as well as prolonged poor PO intake. We have switched to oral augmentin for UTI treatment from IV ceftraixone since patient is tolerating PO intake at this point. On exam today, she is doing much better and was able to answer all questions and follow commands promptly. She did not seem that she was in any distress. Currently pending SNF placement.   Plan:  - Day 1 of Augmentin  - Encourage PO Fluid Intake  - Appreciate neurology recommendations  #Agitation/Sundowning Pt did not seem to be agitated last night, as no notes were recorded and the night team was not notified. Sitter was discontinued.   Plan:  - Seroquel 100mg  (pt's home dose) at 6PM  - Seroquel 100mg  PRN for agitation if necessary.   #Permanent A-Fib  Pt has history of A-Fib, and has been controlled with Coreg 3.125, and anticoagulated with eliquis 5mg . She has been able to tolerate taking PO medications now.   Code: DNR   Dispo: Anticipated discharge to in  less than three midnights.   Drucie Opitz, MD PGY-1 Internal Medicine Resident Pager Number [phone] Please contact the on call pager after 5 pm and on weekends at [phone].

## 2022-07-28 DIAGNOSIS — R030 Elevated blood-pressure reading, without diagnosis of hypertension: Secondary | ICD-10-CM | POA: Diagnosis not present

## 2022-07-28 DIAGNOSIS — N390 Urinary tract infection, site not specified: Secondary | ICD-10-CM | POA: Diagnosis not present

## 2022-07-28 DIAGNOSIS — G9340 Encephalopathy, unspecified: Secondary | ICD-10-CM | POA: Diagnosis not present

## 2022-07-28 LAB — BASIC METABOLIC PANEL
Anion gap: 11 (ref 5–15)
BUN: 14 mg/dL (ref 8–23)
CO2: 24 mmol/L (ref 22–32)
Calcium: 8.7 mg/dL — ABNORMAL LOW (ref 8.9–10.3)
Chloride: 106 mmol/L (ref 98–111)
Creatinine, Ser: 1.31 mg/dL — ABNORMAL HIGH (ref 0.44–1.00)
GFR, Estimated: 38 mL/min — ABNORMAL LOW (ref >60)
Glucose, Bld: 111 mg/dL — ABNORMAL HIGH (ref 70–99)
Potassium: 3.5 mmol/L (ref 3.5–5.1)
Sodium: 141 mmol/L (ref 135–145)

## 2022-07-28 LAB — CULTURE, BLOOD (ROUTINE X 2)
Culture: NO GROWTH
Special Requests: ADEQUATE

## 2022-07-28 LAB — GLUCOSE, CAPILLARY
Glucose-Capillary: 110 mg/dL — ABNORMAL HIGH (ref 70–99)
Glucose-Capillary: 117 mg/dL — ABNORMAL HIGH (ref 70–99)
Glucose-Capillary: 126 mg/dL — ABNORMAL HIGH (ref 70–99)

## 2022-07-28 MED ORDER — QUETIAPINE FUMARATE 50 MG PO TABS
50.0000 mg | ORAL_TABLET | ORAL | Status: DC | PRN
  Administered 2022-07-28 – 2022-07-30 (×2): 50 mg via ORAL
  Filled 2022-07-28 (×2): qty 1

## 2022-07-28 NOTE — Progress Notes (Signed)
Subjective:   Summary: JHANIA ETHERINGTON is a 88 y.o. year old female currently admitted on the IMTS HD#4 for toxic metabolic encephalopathy.  Overnight Events: No overnight events. Required no additional antipsychotics.   Patient was seen this a.m. bedside.  She continues to improve per patients daughter who is at bedside. She states she is in no pain. She knew who her daughter was (which is improved from earlier in the week.) She is able to hold coherent conversations as well.   Objective:  Vital signs in last 24 hours: Vitals:   07/28/22 0414 07/28/22 0444 07/28/22 0808 07/28/22 1131  BP: (!) 212/83 (!) 176/56 (!) 187/45 (!) 184/60  Pulse: 84 83 85 84  Resp: 17  20 17   Temp: 98.2 F (36.8 C)  97.9 F (36.6 C) 98.6 F (37 C)  TempSrc: Oral  Oral Oral  SpO2: 95%  98% 95%  Weight:       Supplemental O2: Room Air SpO2: 95 % O2 Flow Rate (L/min): 2 L/min   Physical Exam:    Constitutional: elderly and in no distress.  HENT:  Head: Normocephalic and atraumatic.  Cardiovascular: Normal rate, regular rhythm, intact distal pulses. No gallop and no friction rub.  No murmur heard. No lower extremity edema  Pulmonary: Non labored breathing on room air, no wheezing or rales  Abdominal: Soft. Normal bowel sounds. Non distended and non tender Musculoskeletal: Normal range of motion.        General: No tenderness or edema.  Neurological: Alert and oriented to person, place. Non focal  Skin: Skin is warm and dry.   Filed Weights   07/23/22 2031  Weight: 57.6 kg     Intake/Output Summary (Last 24 hours) at 07/28/2022 1306 Last data filed at 07/28/2022 1234 Gross per 24 hour  Intake 620 ml  Output 750 ml  Net -130 ml    Net IO Since Admission: -1,020 mL [07/28/22 1306]  Pertinent Labs:    Latest Ref Rng & Units 07/23/2022    2:15 AM 07/22/2022    1:33 PM 07/22/2022    1:32 PM  CBC  WBC 4.0 - 10.5 K/uL 6.9   4.4   Hemoglobin 12.0 - 15.0 g/dL 12.7   12.9  12.8   Hematocrit 36.0 - 46.0 % 40.5  38.0  40.0   Platelets 150 - 400 K/uL 139   144        Latest Ref Rng & Units 07/28/2022    3:39 AM 07/27/2022    6:59 AM 07/26/2022    3:01 AM  CMP  Glucose 70 - 99 mg/dL 111  96  91   BUN 8 - 23 mg/dL 14  25  30    Creatinine 0.44 - 1.00 mg/dL 1.31  1.34  1.55   Sodium 135 - 145 mmol/L 141  141  140   Potassium 3.5 - 5.1 mmol/L 3.5  3.4  3.6   Chloride 98 - 111 mmol/L 106  106  105   CO2 22 - 32 mmol/L 24  22  27    Calcium 8.9 - 10.3 mg/dL 8.7  8.3  8.5    Assessment/Plan:   Principal Problem:   Acute encephalopathy Active Problems:   UTI (urinary tract infection)   AKI (acute kidney injury) ([place])   Slurred speech   Patient Summary: KAMEO BAINS is a 88 y.o. with a pertinent PMH  permanent  atrial fibrillation on Eliquis, CVA without residual deficits, coronary artery disease with STEMI in 2017 status post DES x2 to the LAD, hypertension, CKD, who presented with acute encephalopathy and admitted for toxic metabolic encephalopathy secondary to unresolved UTI.    #Multifactorial encephalopathy #UTI #Vitamin B12 and thiamine inadequacy #Poor p.o. intake Encephalopathy was most likely multifactorial in nature, likely due to untreated UTI as well as prolonged poor PO intake. We have switched to oral augmentin for complicated UTI treatment from IV ceftraixone since patient is tolerating PO intake at this point. Continues to improve. Currently pending SNF placement.   Plan:  - Day 6/7 of antibiotics continue augmentin  - Encourage PO Fluid Intake  - Appreciate neurology recommendations  #Agitation/Sundowning Sitter was discontinued overnight and patient did not require any additional antipsychotics.   Plan:  - Seroquel 100mg  (pt's home dose) at 6PM  - Seroquel 100mg  PRN for agitation if necessary. - F/u EKG for Qtc    #Permanent A-Fib  Pt has history of A-Fib, and has been controlled with Coreg 3.125, and anticoagulated  with eliquis 5mg . She has been able to tolerate taking PO medications now.   Code: DNR   Dispo: Anticipated discharge to in less than three midnights.   , MD PGY-3 Internal Medicine  Pager [phone]

## 2022-07-29 ENCOUNTER — Inpatient Hospital Stay (HOSPITAL_COMMUNITY): Payer: Medicare PPO

## 2022-07-29 DIAGNOSIS — R030 Elevated blood-pressure reading, without diagnosis of hypertension: Secondary | ICD-10-CM

## 2022-07-29 DIAGNOSIS — G9340 Encephalopathy, unspecified: Secondary | ICD-10-CM | POA: Diagnosis not present

## 2022-07-29 DIAGNOSIS — N390 Urinary tract infection, site not specified: Secondary | ICD-10-CM | POA: Diagnosis not present

## 2022-07-29 LAB — BASIC METABOLIC PANEL
Anion gap: 9 (ref 5–15)
BUN: 13 mg/dL (ref 8–23)
CO2: 25 mmol/L (ref 22–32)
Calcium: 8.8 mg/dL — ABNORMAL LOW (ref 8.9–10.3)
Chloride: 109 mmol/L (ref 98–111)
Creatinine, Ser: 1.25 mg/dL — ABNORMAL HIGH (ref 0.44–1.00)
GFR, Estimated: 40 mL/min — ABNORMAL LOW (ref >60)
Glucose, Bld: 96 mg/dL (ref 70–99)
Potassium: 3.6 mmol/L (ref 3.5–5.1)
Sodium: 143 mmol/L (ref 135–145)

## 2022-07-29 LAB — GLUCOSE, CAPILLARY
Glucose-Capillary: 110 mg/dL — ABNORMAL HIGH (ref 70–99)
Glucose-Capillary: 113 mg/dL — ABNORMAL HIGH (ref 70–99)
Glucose-Capillary: 139 mg/dL — ABNORMAL HIGH (ref 70–99)
Glucose-Capillary: 124 mg/dL — ABNORMAL HIGH (ref 70–99)

## 2022-07-29 NOTE — Progress Notes (Signed)
Subjective:   Summary: Chelsea Fuller is a 88 y.o. year old female currently admitted on the IMTS HD#5 for toxic metabolic encephalopathy.  Overnight Events: No overnight events. Required 1x dose of PRN antipsychotics.   Patient was seen this a.m. bedside.  She remains stable. She did have some R ankle pain and her blood pressures were elevated.   Objective:  Vital signs in last 24 hours: Vitals:   07/29/22 0513 07/29/22 0750 07/29/22 0900 07/29/22 1202  BP: (!) 157/69 (!) 196/60 (!) 170/63 (!) 146/79  Pulse:  85  (!) 57  Resp: 20 19  16   Temp: 97.6 F (36.4 C) 98.5 F (36.9 C)  (!) 97.5 F (36.4 C)  TempSrc: Oral Oral  Oral  SpO2: 94% 94%  94%  Weight:       Supplemental O2: Room Air SpO2: 94 % O2 Flow Rate (L/min): 2 L/min   Physical Exam:  Constitutional: Well-developed, well-nourished, and mild discomfort.  Cardiovascular: Normal rate, regular rhythm, intact distal pulses. No gallop and no friction rub.  No murmur heard. No lower extremity edema  Pulmonary: Non labored breathing on room air, no wheezing or rales  Abdominal: Soft. Normal bowel sounds. Non distended and non tender Musculoskeletal: Normal range of motion.        General: No edema, TTP at R ankle  Neurological: Alert and oriented to person, place, and time. Non focal  Skin: Skin is warm and dry.    Filed Weights   07/23/22 2031  Weight: 57.6 kg     Intake/Output Summary (Last 24 hours) at 07/29/2022 1230 Last data filed at 07/29/2022 1200 Gross per 24 hour  Intake 320 ml  Output 300 ml  Net 20 ml    Net IO Since Admission: -700 mL [07/29/22 1230]  Pertinent Labs:    Latest Ref Rng & Units 07/23/2022    2:15 AM 07/22/2022    1:33 PM 07/22/2022    1:32 PM  CBC  WBC 4.0 - 10.5 K/uL 6.9   4.4   Hemoglobin 12.0 - 15.0 g/dL 12.7  12.9  12.8   Hematocrit 36.0 - 46.0 % 40.5  38.0  40.0   Platelets 150 - 400 K/uL 139   144        Latest Ref Rng & Units 07/29/2022     6:46 AM 07/28/2022    3:39 AM 07/27/2022    6:59 AM  CMP  Glucose 70 - 99 mg/dL 96  111  96   BUN 8 - 23 mg/dL 13  14  25    Creatinine 0.44 - 1.00 mg/dL 1.25  1.31  1.34   Sodium 135 - 145 mmol/L 143  141  141   Potassium 3.5 - 5.1 mmol/L 3.6  3.5  3.4   Chloride 98 - 111 mmol/L 109  106  106   CO2 22 - 32 mmol/L 25  24  22    Calcium 8.9 - 10.3 mg/dL 8.8  8.7  8.3    Assessment/Plan:   Principal Problem:   Acute encephalopathy Active Problems:   UTI (urinary tract infection)   AKI (acute kidney injury) ([facility])   Slurred speech   Elevated blood pressure reading   Patient Summary: Chelsea Fuller is a 88 y.o. with a pertinent PMH  permanent atrial fibrillation on Eliquis, CVA without residual deficits, coronary artery disease with STEMI in 2017 status post DES x2  to the LAD, hypertension, CKD, who presented with acute encephalopathy and admitted for toxic metabolic encephalopathy secondary to unresolved UTI.    #Multifactorial encephalopathy #UTI #Vitamin B12 and thiamine inadequacy #Poor p.o. intake Encephalopathy was most likely multifactorial in nature, in the setting of untreated UTI as well as prolonged poor PO intake. She has been transitioned to oral augmentin for complicated UTI treatment from IV ceftraixone since patient is tolerating PO intake at this point. Today is her last day of antibiotics. Her mentation remains stable. Currently pending SNF placement.   Plan:  - Day 7/7 of antibiotics continue augmentin  - Encourage PO Fluid Intake    #Agitation/Sundowning Remains off sitter. Did require 1x dose of PRN antipsychotic.  Plan:  - Seroquel 100mg  (pt's home dose) at 6PM  - Seroquel 50mg  PRN for agitation if necessary. - F/u EKG for Qtc    #Permanent A-Fib  Pt has history of A-Fib, and has been rate controlled with Coreg 3.125, and anticoagulated with eliquis 5mg .  -Continue her home medications  #HTN Some elevated blood pressures in the setting of pain.  Improved after Coreg and APAP. -Will continue to monitor for now.  -Will consider increase of coreg if continue to be elevated, despite pain control   Code: DNR   Dispo: Anticipated discharge to in less than three midnights.   Rick Duff, MD PGY-3 Internal Medicine  Pager [phone]

## 2022-07-30 DIAGNOSIS — R030 Elevated blood-pressure reading, without diagnosis of hypertension: Secondary | ICD-10-CM | POA: Diagnosis not present

## 2022-07-30 DIAGNOSIS — G9340 Encephalopathy, unspecified: Secondary | ICD-10-CM | POA: Diagnosis not present

## 2022-07-30 DIAGNOSIS — N390 Urinary tract infection, site not specified: Secondary | ICD-10-CM | POA: Diagnosis not present

## 2022-07-30 LAB — BASIC METABOLIC PANEL
Anion gap: 10 (ref 5–15)
BUN: 13 mg/dL (ref 8–23)
CO2: 24 mmol/L (ref 22–32)
Calcium: 8.7 mg/dL — ABNORMAL LOW (ref 8.9–10.3)
Chloride: 108 mmol/L (ref 98–111)
Creatinine, Ser: 1.2 mg/dL — ABNORMAL HIGH (ref 0.44–1.00)
GFR, Estimated: 42 mL/min — ABNORMAL LOW (ref >60)
Glucose, Bld: 118 mg/dL — ABNORMAL HIGH (ref 70–99)
Potassium: 4.1 mmol/L (ref 3.5–5.1)
Sodium: 142 mmol/L (ref 135–145)

## 2022-07-30 LAB — GLUCOSE, CAPILLARY
Glucose-Capillary: 103 mg/dL — ABNORMAL HIGH (ref 70–99)
Glucose-Capillary: 186 mg/dL — ABNORMAL HIGH (ref 70–99)
Glucose-Capillary: 84 mg/dL (ref 70–99)
Glucose-Capillary: 124 mg/dL — ABNORMAL HIGH (ref 70–99)

## 2022-07-30 MED ORDER — IRBESARTAN 150 MG PO TABS
150.0000 mg | ORAL_TABLET | Freq: Every day | ORAL | Status: DC
  Administered 2022-07-30 – 2022-08-01 (×3): 150 mg via ORAL
  Filled 2022-07-30 (×3): qty 1

## 2022-07-30 MED ORDER — ACETAMINOPHEN 650 MG RE SUPP: 650.0000 mg | Freq: Four times a day (QID) | RECTAL | Status: DC

## 2022-07-30 MED ORDER — OXYCODONE HCL 5 MG PO TABS: 2.5000 mg | ORAL_TABLET | Freq: Four times a day (QID) | ORAL | Status: DC | PRN

## 2022-07-30 MED ORDER — ACETAMINOPHEN 325 MG PO TABS
650.0000 mg | ORAL_TABLET | Freq: Four times a day (QID) | ORAL | Status: DC
  Administered 2022-07-30 – 2022-08-01 (×5): 650 mg via ORAL
  Filled 2022-07-30 (×4): qty 2

## 2022-07-30 MED ORDER — AMLODIPINE BESYLATE 5 MG PO TABS
5.0000 mg | ORAL_TABLET | Freq: Every day | ORAL | Status: DC
  Administered 2022-07-30 – 2022-07-31 (×2): 5 mg via ORAL
  Filled 2022-07-30 (×2): qty 1

## 2022-07-30 NOTE — Progress Notes (Signed)
Mobility Specialist: Progress Note   07/30/22 1704  Mobility  Activity Ambulated with assistance in room  Level of Assistance Moderate assist, patient does 50-74%  Assistive Device Front wheel walker  Distance Ambulated (ft) 20 ft  Activity Response Tolerated fair  $Mobility charge 1 Mobility   Pt received in the bed and agreeable to mobility. Pleasantly confused this session. Required minA with bed mobility as well as to stand. Required minA-modA during ambulation for balance d/t posterior lean. C/o general fatigue throughout. Pt back to bed after session with call bell and phone in reach.   [facility] Gavin Faivre Mobility Specialist Mobility Specialist [address]: [phone]

## 2022-07-30 NOTE — Progress Notes (Signed)
Physical Therapy Treatment Patient Details Name: Chelsea Fuller MRN: [id number] DOB: 03-14-1924 Today's Date: 07/30/2022   History of Present Illness Pt is a 88 y/o female who presented with slurred speech and AMS. MRI negative for acute infarct. EEG pending. PMH: a fib, CVA, CAD with STEMI, HTN, CKDIII    PT Comments    Pt remains confused but participates and is very pleasant. Pt with improved ambulation tolerance despite R ankle pain. Pt with mild SOB, HR 44bpm via pulse ox, 78bpm via tele box. BP 178/75, SPO2 at 92%. Pt continues with L lateral bias in standing suspect due to avoiding R LE weightbearing due to ankle pain. Continue to recommend SNF upon d/c. Acute PT to cont to follow.    Recommendations for follow up therapy are one component of a multi-disciplinary discharge planning process, led by the attending physician.  Recommendations may be updated based on patient status, additional functional criteria and insurance authorization.  Follow Up Recommendations  Skilled nursing-short term rehab (<3 hours/day) Can patient physically be transported by private vehicle: Yes   Assistance Recommended at Discharge Frequent or constant Supervision/Assistance  Patient can return home with the following Assistance with cooking/housework;Help with stairs or ramp for entrance;Assist for transportation;Direct supervision/assist for financial management;Direct supervision/assist for medications management;A lot of help with walking and/or transfers;A lot of help with bathing/dressing/bathroom   Equipment Recommendations  None recommended by PT    Recommendations for Other Services       Precautions / Restrictions Precautions Precautions: Fall Restrictions Weight Bearing Restrictions: No     Mobility  Bed Mobility Overal bed mobility: Needs Assistance Bed Mobility: Rolling, Sidelying to Sit Rolling: Mod assist Sidelying to sit: Mod assist, Max assist       General bed  mobility comments: max directional verbal cues, pt used R UE to pull self over to L side, modA for LE management off bed and maxA for trunk elevation to EOB    Transfers Overall transfer level: Needs assistance Equipment used: Rolling walker (2 wheels) Transfers: Sit to/from Stand Sit to Stand: Min assist           General transfer comment: Min assist for boost and to keep weight forward onto RW due to posterior LOB.    Ambulation/Gait Ambulation/Gait assistance: Mod assist Gait Distance (Feet): 20 Feet Assistive device: Rolling walker (2 wheels) Gait Pattern/deviations: Step-through pattern, Decreased stride length, Knee flexed in stance - right, Shuffle, Scissoring, Ataxic, Leaning posteriorly, Staggering left Gait velocity: slow Gait velocity interpretation: <1.31 ft/sec, indicative of household ambulator   General Gait Details: Frequent assist for leftward and posterior LOB, Required up to mod assist at times to correct balance. Fair RW control but occasionally bumping into objects in room. Educated on RW use and proximity with VC and tactile cues to demonstrate. aware pt with R foot pain despite not having antalgic limp pt with strong aversion to L lateral weight shift, suspecting R foot pain inhibiting increased R LE Wbing, rehab tech to follow with chair   Stairs             Wheelchair Mobility    Modified Rankin (Stroke Patients Only)       Balance Overall balance assessment: Needs assistance Sitting-balance support: No upper extremity supported, Feet supported Sitting balance-Leahy Scale: Fair Sitting balance - Comments: After one initial episode of posterior LOB, pt able to self correct while sitting EOB. Participated in forward and lateral reaching ipsilateral and across chest for LOS challenge.   Standing  balance support: Bilateral upper extremity supported, Reliant on assistive device for balance Standing balance-Leahy Scale: Poor Standing balance  comment: reliant on RW and external support                            Cognition Arousal/Alertness: Awake/alert Behavior During Therapy: WFL for tasks assessed/performed Overall Cognitive Status: Impaired/Different from baseline Area of Impairment: Orientation, Following commands, Memory, Awareness, Problem solving                 Orientation Level: Disoriented to, Place, Time, Situation   Memory: Decreased short-term memory (unable to recall being in [facility] hospital or the date at end of the session despite being re-oriented) Following Commands: Follows one step commands with increased time   Awareness: Intellectual Problem Solving: Slow processing, Difficulty sequencing, Requires verbal cues, Requires tactile cues General Comments: pt pleasant but confused with poor recall once re-oriented. Pt did follow simple commands but demo'd not initiationt to task        Exercises      General Comments General comments (skin integrity, edema, etc.): BP 178/75, SpO2 92% on RA, HR 44 on pulse ox, 60bpm manually, 78bpm on tele box      Pertinent Vitals/Pain Pain Assessment Pain Assessment: Faces Faces Pain Scale: Hurts even more Pain Location: grimacing when R foot/ankle touched, donning socks, however didn't grimace in weightbearing when amb Pain Descriptors / Indicators: Grimacing, Guarding Pain Intervention(s): Monitored during session    Home Living                          Prior Function            PT Goals (current goals can now be found in the care plan section) Acute Rehab PT Goals Patient Stated Goal: none PT Goal Formulation: Patient unable to participate in goal setting Time For Goal Achievement: 08/06/22 Potential to Achieve Goals: Fair Progress towards PT goals: Progressing toward goals    Frequency    Min 2X/week      PT Plan Current plan remains appropriate    Co-evaluation              AM-PAC PT "6 Clicks" Mobility    Outcome Measure  Help needed turning from your back to your side while in a flat bed without using bedrails?: A Little Help needed moving from lying on your back to sitting on the side of a flat bed without using bedrails?: A Little Help needed moving to and from a bed to a chair (including a wheelchair)?: A Lot Help needed standing up from a chair using your arms (e.g., wheelchair or bedside chair)?: A Lot Help needed to walk in hospital room?: A Lot Help needed climbing 3-5 steps with a railing? : Total 6 Click Score: 13    End of Session Equipment Utilized During Treatment: Gait belt Activity Tolerance: Patient tolerated treatment well Patient left: in bed;with call bell/phone within reach;with bed alarm set;with family/visitor present Nurse Communication: Mobility status PT Visit Diagnosis: Unsteadiness on feet (R26.81);Muscle weakness (generalized) (M62.81);Difficulty in walking, not elsewhere classified (R26.2)     Time: [id number] PT Time Calculation (min) (ACUTE ONLY): 23 min  Charges:  $Gait Training: 8-22 mins $Therapeutic Activity: 8-22 mins                     Donnesha Karg, PT, DPT Acute Rehabilitation Services Secure chat preferred Office #:  [place] 07/30/2022, 10:08 AM

## 2022-07-30 NOTE — Progress Notes (Addendum)
Subjective:   Summary: Chelsea Fuller is a 88 y.o. year old female currently admitted on the IMTS HD#6 for toxic metabolic encephalopathy.  Overnight Events:    Pt was seen this AM bedside. She was sitting in her chair resting comfortably. She states she is still having some pain on the dorsal side of her right foot. She is able to answer questions and follow commands. She does bring up someone referred to as "he", however was unable to state who "he" is.   Objective:  Vital signs in last 24 hours: Vitals:   07/30/22 0022 07/30/22 0500 07/30/22 0812 07/30/22 1137  BP: (!) 211/126 (!) 194/70 (!) 188/103 (!) 173/67  Pulse: 85 82 (!) 51 (!) 43  Resp: 20 18 16 16   Temp: 97.6 F (36.4 C) (!) 96.8 F (36 C) (!) 97.5 F (36.4 C) 97.8 F (36.6 C)  TempSrc: Axillary Axillary Oral Oral  SpO2: 97% 95% 96% 96%  Weight:       Supplemental O2: Nasal Cannula SpO2: 96 % O2 Flow Rate (L/min): 2 L/min   Physical Exam:  Constitutional: Elderly woman, sitting in chair, slightly confused, in no acute distress Cardiovascular: RRR, no murmurs, rubs or gallops Pulmonary/Chest: normal work of breathing on room air, lungs clear to auscultation bilaterally Abdominal: soft, non-tender, non-distended Skin: warm and dry Extremities: upper/lower extremity pulses 2+, no lower extremity edema present, blister on L calcaneous with ecchymosis on L leg. Thin skin under calcaneous.    Filed Weights   07/23/22 2031  Weight: 57.6 kg     Intake/Output Summary (Last 24 hours) at 07/30/2022 1312 Last data filed at 07/29/2022 1800 Gross per 24 hour  Intake 120 ml  Output 300 ml  Net -180 ml   Net IO Since Admission: -880 mL [07/30/22 1312]  Pertinent Labs:    Latest Ref Rng & Units 07/23/2022    2:15 AM 07/22/2022    1:33 PM 07/22/2022    1:32 PM  CBC  WBC 4.0 - 10.5 K/uL 6.9   4.4   Hemoglobin 12.0 - 15.0 g/dL 12.7  12.9  12.8   Hematocrit 36.0 - 46.0 % 40.5  38.0  40.0    Platelets 150 - 400 K/uL 139   144        Latest Ref Rng & Units 07/30/2022   12:57 AM 07/29/2022    6:46 AM 07/28/2022    3:39 AM  CMP  Glucose 70 - 99 mg/dL 118  96  111   BUN 8 - 23 mg/dL 13  13  14    Creatinine 0.44 - 1.00 mg/dL 1.20  1.25  1.31   Sodium 135 - 145 mmol/L 142  143  141   Potassium 3.5 - 5.1 mmol/L 4.1  3.6  3.5   Chloride 98 - 111 mmol/L 108  109  106   CO2 22 - 32 mmol/L 24  25  24    Calcium 8.9 - 10.3 mg/dL 8.7  8.8  8.7     Assessment/Plan:   Principal Problem:   Acute encephalopathy Active Problems:   UTI (urinary tract infection)   AKI (acute kidney injury) (HCC)   Slurred speech   Elevated blood pressure reading   Patient Summary: Chelsea Fuller is a 88 y.o. with a pertinent PMH of permanent atrial fibrillation on Eliquis, CVA without residual deficits, coronary artery disease with STEMI in 2017 s/p DES  x2 to the LAD, hypertension, CKD, who presented with acute encephalopathy and admitted for multifactorial toxic encephalopathy.   #Multifactorial Encephalopathy (Improving)  #UTI  #Vitamin B12 and Thiamine Inadequacy  #Poor PO Intake Pt's encephalopathy was most likely multifactorial in nature, in the setting of untreated UTI as well as prolonged poor PO intake. She has finished her augmentin course for the UTI. Mentation remains stable at the moment, and currently working on finding a SNF for her. Has been difficult as patient has been experiencing agitation over night, however skilled nursing facilities are currently looking at her again and case management is working to find her a bed.   Plan:  - Continue to encourage PO fluid intake  - Await placement for SNF  #Agitation/Sundowning  Pt remains off sitter and had no signs of agitation last night.   Plan:  - Seroquel 100mg  at bed time - Seroquel 50mg  PRN  #HTN Pt's blood pressure has been elevated over night, systolics being in the 180-200 range going into the AM. Currently awaiting  orthostatic blood pressure on this patient, as well as blood pressure form both upper extremities to decide if patient is suitable for another anti-hypertensive. Her pulse this AM was 51. Her current hospital regimen is Coreg 3.125, and Avapro 150mg .   Plan:  - Orthostatics were negative, so we will start amlodipine 5mg  to help with blood pressure control.  #Permanent A-Fib  Pt's hate has been controlled with coreg and anticoagulation has been successful so far with eliquis 5mg   Plan:  - Continue home medications  #R Ankle Pain  Over the weekend, patient was complaining of R ankle/foot pain. Pain is located towards the toes and dorsal aspect of foot. She has not fallen, however has been reportedly laying down awkwardly in bed. HTN could be secondary to this patient's pain.    Code: DNR   Dispo: Anticipated discharge in less than two midnights  , MD PGY-1 Internal Medicine Resident Pager Number [phone] Please contact the on call pager after 5 pm and on weekends at [phone].

## 2022-07-30 NOTE — Plan of Care (Signed)

## 2022-07-31 LAB — GLUCOSE, CAPILLARY
Glucose-Capillary: 118 mg/dL — ABNORMAL HIGH (ref 70–99)
Glucose-Capillary: 122 mg/dL — ABNORMAL HIGH (ref 70–99)
Glucose-Capillary: 97 mg/dL (ref 70–99)

## 2022-07-31 MED ORDER — SPIRONOLACTONE 12.5 MG HALF TABLET
12.5000 mg | ORAL_TABLET | ORAL | Status: DC
  Administered 2022-08-01: 12.5 mg via ORAL
  Filled 2022-07-31 (×2): qty 1

## 2022-07-31 MED ORDER — AMLODIPINE BESYLATE 5 MG PO TABS: 5.0000 mg | ORAL_TABLET | Freq: Every day | ORAL | Status: DC

## 2022-07-31 MED ORDER — SODIUM CHLORIDE 0.9 % IV BOLUS: 1000.0000 mL | Freq: Once | INTRAVENOUS | Status: DC

## 2022-07-31 NOTE — Progress Notes (Signed)
[facility] arrived for transport and patient's BP elevated at 205/67. MD contacted and to bedside to assess. Decision made to cancel discharge and keep patient overnight for observation. Medication adjustments pending.

## 2022-07-31 NOTE — TOC Transition Note (Signed)
Transition of Care [facility]) - CM/SW Discharge Note   Patient Details  Name: [name] MRN: [id number] Date of Birth: [birth year]  [facility] of Care [facility]) CM/SW Contact:  Geralynn Ochs, LCSW Phone Number: 07/31/2022, 2:51 PM   Clinical Narrative:   CSW received update that [facility] was unable to accept. CSW updated daughters at bedside and discuss options. Family interested in admitting patient to [facility], as that had been the plan prior to hospitalization. CSW confirmed with [facility] that they can meet patient's current care needs. [employer] requested [facility] with [facility]. CSW sent referral to [facility], confirmed receipt. Transport arranged with [facility] for next available.  Nurse to call report to [phone].    Final next level of care: Assisted Living Barriers to Discharge: Barriers Resolved   Patient Goals and CMS Choice Patient states their goals for this hospitalization and ongoing recovery are:: patient unable to participate in goal setting, not oriented CMS Medicare.gov Compare Post Acute Care list provided to:: Patient Represenative (must comment) Choice offered to / list presented to : Adult Children  Discharge Placement              Patient chooses bed at:  [facility]) Patient to be transferred to facility by: [place] Name of family member notified: [name] Patient and family notified of of transfer: 07/31/22  Discharge Plan and Services     Post Acute Care Choice: [place] Arranged: PT, OT [facility] Agency: [facility] [place] Date [facility] Agency Contacted: 07/31/22   Representative spoke with at [place]: [place] Interventions     Readmission Risk Interventions     No data to display

## 2022-07-31 NOTE — Discharge Summary (Signed)
Name: KAMILAH CORREIA MRN: [id number] DOB: [birth year] 88 y.o. PCP: Bonnita Nasuti, MD  Date of Admission: 07/22/2022  1:28 PM Date of Discharge: 07/31/2022 Attending Physician: Angelica Pou, MD  Discharge Diagnosis: 1. Principal Problem:   Acute encephalopathy Active Problems:   UTI (urinary tract infection)   AKI (acute kidney injury) ([place])   Slurred speech   Elevated blood pressure reading   Discharge Medications: Allergies as of 07/31/2022   No Known Allergies      Medication List     STOP taking these medications    Macrobid 100 MG capsule Generic drug: nitrofurantoin (macrocrystal-monohydrate)       TAKE these medications    acetaminophen 500 MG tablet Commonly known as: TYLENOL Take 500 mg by mouth at bedtime as needed for mild pain.   ALPRAZolam 0.5 MG tablet Commonly known as: XANAX Take 0.5 mg by mouth daily as needed for anxiety.   amLODipine 5 MG tablet Commonly known as: NORVASC Take 1 tablet (5 mg total) by mouth daily. Start taking on: August 01, 2022   busPIRone 10 MG tablet Commonly known as: BUSPAR Take 10 mg by mouth 3 (three) times daily.   carvedilol 3.125 MG tablet Commonly known as: COREG TAKE 1 TABLET BY MOUTH 2 TIMES DAILY WITH A MEAL.   Eliquis 2.5 MG Tabs tablet Generic drug: apixaban TAKE 1 TABLET BY MOUTH TWICE A DAY What changed: how much to take   furosemide 20 MG tablet Commonly known as: LASIX Take 1 tablet (20 mg total) by mouth daily.   levothyroxine 75 MCG tablet Commonly known as: SYNTHROID Take 75 mcg by mouth daily before breakfast.   Magnesium 200 MG Tabs Take 200 mg by mouth daily.   nitroGLYCERIN 0.4 MG SL tablet Commonly known as: NITROSTAT Place 0.4 mg under the tongue every 5 (five) minutes as needed for chest pain.   Ocuvite Adult 50+ Caps Take 1 capsule by mouth daily.   QUEtiapine 100 MG tablet Commonly known as: SEROQUEL Take 100 mg by mouth at bedtime.   spironolactone  25 MG tablet Commonly known as: ALDACTONE Take 0.5 tablets (12.5 mg total) by mouth every Monday, Wednesday, and Friday.   Systane Balance 0.6 % Soln Generic drug: Propylene Glycol Place 1 drop into both eyes daily as needed (dry eyes).   valsartan 160 MG tablet Commonly known as: DIOVAN Take 160 mg by mouth daily.        Disposition and follow-up:   Ms.Tyjah L Oncale was discharged from [facility] in Good condition.  At the hospital follow up visit please address:  1.  Please assess need for xanax, patient was un-agitated with nighttime dose of seroquel and seroquel 50mg  PRN for agitation. Please ensure patient's volume status. She also has a blister on her left ankle that needs to be wrapped, and her heels are erythematous and blanching. Also will need ENT follow up for nasal polyp noted on CT scan.   We also only used Irbesartan and Coreg to control her blood pressure, and after the addition of amlodipine everything was adequately controlled, please assess need for other medications.   2.  Labs / imaging needed at time of follow-up: BMP  3.  Pending labs/ test needing follow-up: NA    Hospital Course by problem list:  #Multifactorial Toxic Metabolic Encephalopathy  #UTI  #Vitamin B12 and Thiamine Inadequacy  #Poor PO Intake Patient presented to the emergency department via EMS for slurred speech.  Per  her daughters, patient was at home and ate breakfast at 11 AM without any difficulty but tried to drink, pain daughters noticed that coffee was running down her mouth.  After this patient was confused and had trouble following any commands.  Her daughters also note that she has had poor in p.o. intake for the last few months.  Daughters also state at the time that she had just been diagnosed with a UTI.  On admission she had right facial drooping as well.  Brain MRI was done in the emergency department, which did not reveal any acute abnormality.  EEGs were  then done to evaluate for seizures, first EEG showed no abnormality, second EEG was attempted however patient became agitated.  She was subsequently started on ceftriaxone IV, and labs were sent out for B1, B12, folate, RPR assessing reversible causes of altered mental status.  B12 was low normal, so we subsequently replenished her B12, thiamine was also low normal, so we replenish thiamine levels as well.  Patient showed improvement in mental status throughout her hospital course after replenishing of these vitamins, and continuing to encourage patient to take fluids p.o.  Patient was then transitioned to Augmentin and completed the rest of her antibiotic course in the hospital, with close to baseline return.   #Agitation/Sundowning Patient has a history of sundowning around 5 PM, and her home regimen included Xanax 0.5 mg and Seroquel 100 mg which she will take daily.  Throughout her hospital course she would become agitated at night, and we initially started her on as needed Ativan and her Seroquel dose of 25 mg.  However she will continue to be aggravated, and upon further chart review it was noted that benzodiazepines were not a chronic medication for her.  She was then switched to Haldol for 1 night, but continued to be agitated throughout the night.  She was then given Seroquel 50 mg scheduled at 6 PM, and also Seroquel 50 mg as needed for agitation which seemed to prevent any agitation episodes.  #Pulmonary edema in the setting of iatrogenic fluid resuscitation on admission Upon examination in the second day of patient stay in the hospital, it was noted she was using her accessory muscles for breathing.  Chest x-ray was then obtained which revealed pulmonary edema.  She was treated with IV Lasix 40 mg, and her work of breathing improved.  #Permanent A-fib Patient is a history of A-fib, rate controlled with Coreg 3.125 and anticoagulated with Eliquis 5 mg.  Pulses have been stable throughout her  hospital course.  Continue these medications upon discharge.  #AKI on CKD Patient's creatinine level upon admission was 1.8, however throughout her hospital course with fluid initiation and stopping some anti-hypertensive medications, creatinine slowly trended down to her baseline of 1.5.  #Hypertension Patient has a history of hypertension, was previously taking spironolactone 12.5 mg daily and losartan 165 mg however initially patient had an AKI so we were able to hold medications. Blood pressures started to increase nightly, somewhere between systolic of [id number]. Orthostatic were then done, which was negative, so we initiated amlodipine 10 to her regimen. Can continue medications in the outpatient setting.  #29mm Polypoid Soft Tissue in left Nasal passage Incidental findings on CT, would recommend outpatient ENT follow-up.  #Heel Erythema and R Foot Pain  On physical exam, patient was noted to have erythematous blanching on the heels bilaterally. She has also been complaining of pain in her digits on the right foot. Would recommend following up and  considering tylenol. Would recommend using heel protectors for pressure, and wrapping up blister.   Discharge Exam:   BP (!) 167/57 (BP Location: Left Arm)   Pulse (!) 40   Temp 98.4 F (36.9 C) (Oral)   Resp 17   Wt 57.6 kg   SpO2 95%   BMI 19.89 kg/m  Discharge exam:   Constitutional: Elderly woman, appears at baseline, resting comfortably in bed, in no acute distress  Cardiovascular: RRR, no murmurs, rubs, or gallops Pulmonary/Chest: Normal work of breathing on room air, lungs clear to auscultation bilaterally  Abdominal: Soft, non-tender, non-distended  Skin: Decreased skin turgor, doughy skin on RUE Extremities: Blister on L Calcaneous, ecchymosis present bilaterally, erythematous blanching heels.    Pertinent Labs, Studies, and Procedures:     Latest Ref Rng & Units 07/30/2022   12:57 AM 07/29/2022    6:46 AM 07/28/2022    3:39  AM  BMP  Glucose 70 - 99 mg/dL 118  96  111   BUN 8 - 23 mg/dL 13  13  14    Creatinine 0.44 - 1.00 mg/dL 1.20  1.25  1.31   Sodium 135 - 145 mmol/L 142  143  141   Potassium 3.5 - 5.1 mmol/L 4.1  3.6  3.5   Chloride 98 - 111 mmol/L 108  109  106   CO2 22 - 32 mmol/L 24  25  24    Calcium 8.9 - 10.3 mg/dL 8.7  8.8  8.7        Latest Ref Rng & Units 07/23/2022    2:15 AM 07/22/2022    1:33 PM 07/22/2022    1:32 PM  CBC  WBC 4.0 - 10.5 K/uL 6.9   4.4   Hemoglobin 12.0 - 15.0 g/dL 12.7  12.9  12.8   Hematocrit 36.0 - 46.0 % 40.5  38.0  40.0   Platelets 150 - 400 K/uL 139   144     EEG adult  Result Date: 07/23/2022 Lora Havens, MD     07/23/2022  4:22 PM Patient Name: WILLIDEAN STAGNITTA MRN: [id number] Epilepsy Attending: Lora Havens Referring Physician/Provider: Greta Doom, MD Date: 07/23/2022 Duration: 29.52 mins Patient history: 88yo F h/o CHF, CAD, STEMI, Afib on Eliquis, prior L MCA infarct presents for evaluation of confusion and right sided facial droop.EEG to evaluate for seizure Level of alertness: Awake AEDs during EEG study: None Technical aspects: This EEG study was done with scalp electrodes positioned according to the 10-20 International system of electrode placement. Electrical activity was reviewed with band pass filter of 1-70Hz , sensitivity of 7 uV/mm, display speed of 71mm/sec with a 60Hz  notched filter applied as appropriate. EEG data were recorded continuously and digitally stored.  Video monitoring was available and reviewed as appropriate. Description: No clear posterior dominant rhythm was seen. EEG showed continuous generalized 3 to 6 Hz theta-delta slowing. Hyperventilation and photic stimulation were not performed.   Of note, study was technically difficult due to significant myogenic and movement artifact. ABNORMALITY - Continuous slow, generalized IMPRESSION: This technically difficult study is suggestive of moderate diffuse encephalopathy, nonspecific  etiology. No seizures or epileptiform discharges were seen throughout the recording. Lack of epileptiform activity on interictal EEG does not exclude the diagnosis of epilepsy. If suspicion for interictal activity remains a concern, a prolonged study including sleep can  be considered. Priyanka O Yadav   US RENAL  Result Date: 07/23/2022 CLINICAL DATA:  Acute kidney injury, cystic masses noted in the kidneys  on previous imaging. EXAM: RENAL / URINARY TRACT ULTRASOUND COMPLETE COMPARISON:  January of 2019 CT of the abdomen and pelvis. FINDINGS: Right Kidney: Renal measurements: 8.2 x 3.6 x 4.2 cm = volume: 64.5 mL. 2.1 x 2.0 x 2.2 cm cyst in the upper pole the RIGHT kidney. Smaller renal sinus cyst as well not as well characterized but seen on imaging as far back as 2019 measuring approximately 13 mm. No dedicated imaging follow-up is recommended for these findings. No hydronephrosis. Mild parenchymal thinning. Mild increased cortical echogenicity. Left Kidney: Renal measurements: 8.3 x 3.7 x 3.6 cm = volume: 58.7 mL. Mild parenchymal thinning. Mild increased cortical echogenicity. No hydronephrosis. No visible calculi. Bladder: Appears normal for degree of bladder distention. Other: None. IMPRESSION: Signs of medical renal disease and RIGHT renal cysts without hydronephrosis. Electronically Signed   By: Zetta Bills M.D.   On: 07/23/2022 09:59   EEG adult  Result Date: 07/22/2022 Derek Jack, MD     07/22/2022  7:05 PM Routine EEG Report MARIAFERNANDA MYKLEBUST is a 88 y.o. female with a history of aphasia who is undergoing an EEG to evaluate for seizures. Report: This EEG was acquired with electrodes placed according to the International 10-20 electrode system (including Fp1, Fp2, F3, F4, C3, C4, P3, P4, O1, O2, T3, T4, T5, T6, A1, A2, Fz, Cz, Pz). The following electrodes were missing or displaced: none. The occipital dominant rhythm was 8.5 Hz. This activity is reactive to stimulation. Drowsiness was  manifested by background fragmentation; deeper stages of sleep were not identified. There was continuous EMG artifact in the bifrontal L>R areas preventing adequate evaluation of those regions. In the posterior regions, no epileptiform abnormalities were noted. Photic stimulation and hyperventilation were not performed. Impression: This EEG was obtained while awake and drowsy and is normal in the posterior regions. EMG artifact prevents evaluation of the bifrontal L>R regions which includes the area of interest for aphasia. If clinical suspicion for seizures remains please order repeat EEG.   Clinical Correlation: Normal EEGs, however, do not rule out epilepsy. Su Monks, MD [facility] [phone] If 7pm- 7am, please page neurology on call as listed in [place].   MR BRAIN WO CONTRAST  Result Date: 07/22/2022 CLINICAL DATA:  Provided history: Neuro deficit, acute, stroke suspected. EXAM: MRI HEAD WITHOUT CONTRAST TECHNIQUE: Multiplanar, multiecho pulse sequences of the brain and surrounding structures were obtained without intravenous contrast. COMPARISON:  Noncontrast head CT and CT angiogram head/neck performed earlier today 07/22/2022. FINDINGS: Intermittently motion degraded examination, limiting evaluation. Most notably, the sagittal T1 weighted sequence is moderate to severely motion degraded, the axial SWI sequence is mild to moderately motion degraded, the axial T1 weighted sequences severely motion degraded and the coronal T2 sequence is severely motion degraded. Brain: Moderate generalized cerebral atrophy. Chronic left MCA territory cortical/subcortical infarcts within the left frontal lobe, left insula and left parietal lobe. Chronic lacunar infarcts within the right basal ganglia and thalamus. Tiny chronic infarcts within the bilateral cerebellar hemispheres. There is no acute infarct. Within the limitations of motion degradation, no appreciable intracranial mass, chronic intracranial  blood products or extra-axial fluid collection. No midline shift. Vascular: Maintained flow voids within the proximal large arterial vessels. Skull and upper cervical spine: No focal suspicious marrow lesion. Apparent C3-C4 vertebral body ankylosis. Incompletely assessed cervical spondylosis. Sinuses/Orbits: No mass or acute finding within the imaged orbits. Prior bilateral ocular lens replacement. Large fluid level, and background mild mucosal thickening, within the left maxillary sinus. The left  sphenoid sinus is completely opacified by inspissated secretions. Mild mucosal thickening within the anterior left ethmoid air cells. Other: 18 mm T2 hypointense polypoid soft tissue focus within the left nasal passage. IMPRESSION: 1. Intermittently motion degraded examination, as described. 2. No evidence of acute intracranial abnormality. Specifically, the diffusion-weighted imaging is of good quality and there is no evidence of acute infarct. 3. Parenchymal atrophy, chronic small vessel ischemic disease and chronic infarcts as described. 4. Paranasal sinus disease, as outlined. 5. 18 mm polypoid soft tissue focus within the left nasal passage. Direct visualization recommended. Electronically Signed   By: Kellie Simmering D.O.   On: 07/22/2022 16:38   CT ANGIO HEAD NECK W WO CM (CODE STROKE)  Result Date: 07/22/2022 CLINICAL DATA:  Neuro deficit, acute, stroke suspected. EXAM: CT ANGIOGRAPHY HEAD AND NECK TECHNIQUE: Multidetector CT imaging of the head and neck was performed using the standard protocol during bolus administration of intravenous contrast. Multiplanar CT image reconstructions and MIPs were obtained to evaluate the vascular anatomy. Carotid stenosis measurements (when applicable) are obtained utilizing NASCET criteria, using the distal internal carotid diameter as the denominator. RADIATION DOSE REDUCTION: This exam was performed according to the departmental dose-optimization program which includes  automated exposure control, adjustment of the mA and/or kV according to patient size and/or use of iterative reconstruction technique. CONTRAST:  Administered contrast not known at this time. COMPARISON:  Noncontrast head CT performed earlier today. FINDINGS: CTA NECK FINDINGS Aortic arch: Common origin of the innominate and left common carotid arteries. Atherosclerotic plaque within the visualized aortic arch and proximal major branch vessels of the neck. No hemodynamically significant innominate or proximal subclavian artery stenosis. Right carotid system: CCA and ICA patent within the neck without hemodynamically significant stenosis (50% or greater). Atherosclerotic plaque about the carotid bifurcation and within the proximal ICA. Left carotid system: CCA and ICA patent within the neck without hemodynamically significant stenosis (50% or greater). Atherosclerotic plaque about the carotid bifurcation and within the proximal ICA. Vertebral arteries: Vertebral arteries patent within the neck. Nonstenotic atherosclerotic plaque within both vessels. The left vertebral artery is slightly dominant. Skeleton: Cervical spondylosis. No acute fracture or aggressive osseous lesion. Other neck: No neck mass or cervical lymphadenopathy. Upper chest: No consolidation within the imaged lung apices. Review of the MIP images confirms the above findings CTA HEAD FINDINGS Anterior circulation: The intracranial internal carotid arteries are patent. Atherosclerotic plaque within both vessels with no more than mild stenosis. The M1 middle cerebral arteries are patent. No M2 proximal branch occlusion is identified. Atherosclerotic irregularity of the M2 and more distal MCA vessels, bilaterally. Most notably, there are sites of moderate stenosis within multiple proximal M2 left MCA vessels (for instance as seen on series 12, image 27). The anterior cerebral arteries are patent. No intracranial aneurysm is identified. Posterior  circulation: The intracranial vertebral arteries are patent. Atherosclerotic plaque within the dominant intracranial left vertebral artery with no more than mild stenosis. Nonstenotic atherosclerotic plaque within the non dominant intracranial right vertebral artery. The basilar artery is patent. The posterior cerebral arteries are patent. Atherosclerotic irregularity of both vessels. Most notably, there is a severe stenosis within the right PCA at the P2/P3 junction. The right PCA is fetal in origin. A left posterior communicating artery is present. Venous sinuses: Within the limitations of contrast timing, no convincing thrombus. Anatomic variants: As described. Review of the MIP images confirms the above findings No emergent large vessel occlusion identified. These results were communicated to Dr. Lorrin Goodell At 2:00  pmon 10/1/2023by text page via the [facility] messaging system. IMPRESSION: CTA neck: 1. The common carotid, internal carotid and vertebral arteries are patent within the neck without hemodynamically significant stenosis. Atherosclerotic plaque, as described. 2.  Aortic Atherosclerosis (ICD10-I70.0). CTA head: 1. No intracranial large vessel occlusion is identified. 2. Intracranial atherosclerotic disease, most notably as follows. 3. Sites of moderate stenosis within multiple proximal M2 left MCA vessels. 4. Severe stenosis within the right PCA at the P2/P3 junction. Electronically Signed   By: Kellie Simmering D.O.   On: 07/22/2022 14:00   CT HEAD CODE STROKE WO CONTRAST  Result Date: 07/22/2022 CLINICAL DATA:  Code stroke. Neuro deficit, acute, stroke suspected. EXAM: CT HEAD WITHOUT CONTRAST TECHNIQUE: Contiguous axial images were obtained from the base of the skull through the vertex without intravenous contrast. RADIATION DOSE REDUCTION: This exam was performed according to the departmental dose-optimization program which includes automated exposure control, adjustment of the mA and/or kV according to  patient size and/or use of iterative reconstruction technique. COMPARISON:  Report from prior head CT 10/04/2013 (images unavailable). Head CT 11/18/2008. FINDINGS: Brain: Moderate cerebral atrophy. Chronic left MCA vascular territory cortical/subcortical infarcts within the left frontal lobe, left insula and left parietooccipital lobes. Background moderate patchy and ill-defined hypoattenuation within the cerebral white matter, nonspecific but compatible chronic small vessel ischemic disease. Chronic appearing lacunar infarcts within the right deep gray nuclei and left thalamus. There is no acute intracranial hemorrhage. No acute demarcated cortical infarct. No extra-axial fluid collection. No evidence of an intracranial mass. No midline shift. Vascular: No hyperdense vessel.  Atherosclerotic calcifications. Skull: No fracture or aggressive osseous lesion. Sinuses/Orbits: No mass or acute finding within the imaged orbits. Complete opacification of the left sphenoid sinus with associated chronic reactive osteitis. Extensive partial opacification of the left maxillary sinus. 18 mm polypoid soft tissue focus within the left nasal passage ASPECTS [facility] Stroke Program Early CT Score) - Ganglionic level infarction (caudate, lentiform nuclei, internal capsule, insula, M1-M3 cortex): 7 - Supraganglionic infarction (M4-M6 cortex): 3 Total score (0-10 with 10 being normal): 10 (when discounting chronic infarcts). No evidence of acute intracranial abnormality. These results were communicated to Dr. Lorrin Goodell at 1:45 pmon 10/1/2023by text page via the [facility] messaging system. IMPRESSION: No evidence of acute intracranial abnormality. Parenchymal atrophy, chronic small vessel disease and chronic infarcts as described. Paranasal sinus disease, as outlined. 18 mm polypoid soft tissue focus within the left nasal passage. Direct visualization recommended. Electronically Signed   By: Kellie Simmering D.O.   On: 07/22/2022 13:48      Discharge Instructions: Discharge Instructions     Diet - low sodium heart healthy   Complete by: As directed    Increase activity slowly   Complete by: As directed        Signed: Drucie Opitz, MD 07/31/2022, 11:38 AM   Pager: [phone]

## 2022-07-31 NOTE — Discharge Instructions (Signed)
It was a pleasure being a part of your care. You were brought in because of altered mental status, which we think was because of your UTI. You finished your antibiotic course (so no need to take more), and started improving throughout your stay here! Please remember to continue drinking water, as much as you can to prevent any sort of dehydration. Take care!

## 2022-07-31 NOTE — NC FL2 (Signed)
[place] LEVEL OF CARE SCREENING TOOL     IDENTIFICATION  Patient Name: Chelsea Fuller Birthdate: November 04, 1923 Sex: female Admission Date (Current Location): 07/22/2022  [facility] and [place] Number:  Herbalist and Address:  [facility], [place] [address] [place], [place] [address]      Provider Number: [id number]  Attending Physician Name and Address:  Angelica Pou, MD  Relative Name and Phone Number:       Current Level of Care: Hospital Recommended Level of Care: [place] Prior Approval Number:    Date Approved/Denied:   PASRR Number: [id number]  Discharge Plan: Other (Comment) (ALF)    Current Diagnoses: Patient Active Problem List   Diagnosis Date Noted   Elevated blood pressure reading    Slurred speech 07/24/2022   UTI (urinary tract infection) 07/23/2022   AKI (acute kidney injury) ([place]) 07/23/2022   Acute encephalopathy 07/22/2022   Aphasia    Dyspnea 09/30/2018   PICC (peripherally inserted central catheter) in place 11/28/2017   Abnormal LFTs 11/08/2017   Thrombocytopenia ([place]) 11/08/2017   Normocytic anemia 11/08/2017   Elevated AST (SGOT) 11/07/2017   MRSA bacteremia 11/05/2017   Weakness generalized 11/04/2017   Acute cystitis without hematuria    Ischemic cardiomyopathy 10/30/2017   CAD (coronary artery disease)-DES/LAD 2017 10/30/2017   Hypoxia 10/30/2017   Acute on chronic systolic heart failure ([place]) 10/30/2017   CKD (chronic kidney disease) stage 3, GFR 30-59 ml/min (HCC) 10/30/2017   Fever 10/30/2017   Accelerated hypertension 09/24/2016   Acute on chronic diastolic CHF (congestive heart failure) ([place]) 09/23/2016   Old anterior ST elevation MI    Chronic atrial fibrillation    Epistaxis 10/04/2013   Anemia 10/04/2013   Nasal fracture 10/04/2013   History of CVA (cerebrovascular accident) 10/04/2013   HTN (hypertension) 10/04/2013   Hypothyroidism 10/04/2013     Orientation RESPIRATION BLADDER Height & Weight     Self, Time, Situation  Normal Incontinent Weight: 126 lb 15.8 oz (57.6 kg) Height:     BEHAVIORAL SYMPTOMS/MOOD NEUROLOGICAL BOWEL NUTRITION STATUS      Continent Diet  AMBULATORY STATUS COMMUNICATION OF NEEDS Skin   Limited Assist Verbally Normal                       Personal Care Assistance Level of Assistance  Bathing, Dressing, Feeding Bathing Assistance: Limited assistance Feeding assistance: Limited assistance Dressing Assistance: Limited assistance     Functional Limitations Info  Speech, Hearing, Sight Sight Info: Adequate Hearing Info: Adequate Speech Info: Adequate    SPECIAL CARE FACTORS FREQUENCY  PT (By licensed PT), OT (By licensed OT)     PT Frequency: 3x/wk with home health OT Frequency: 3x/wk with home health            Contractures Contractures Info: Not present    Additional Factors Info  Code Status, Allergies Code Status Info: DNR Allergies Info: No known allergies             Discharge Medications: acetaminophen 500 MG tablet Commonly known as: TYLENOL Take 500 mg by mouth at bedtime as needed for mild pain.    ALPRAZolam 0.5 MG tablet Commonly known as: XANAX Take 0.5 mg by mouth daily as needed for anxiety.    amLODipine 5 MG tablet Commonly known as: NORVASC Take 1 tablet (5 mg total) by mouth daily. Start taking on: August 01, 2022    busPIRone 10 MG  tablet Commonly known as: BUSPAR Take 10 mg by mouth 3 (three) times daily.    carvedilol 3.125 MG tablet Commonly known as: COREG TAKE 1 TABLET BY MOUTH 2 TIMES DAILY WITH A MEAL.    Eliquis 2.5 MG Tabs tablet Generic drug: apixaban TAKE 1 TABLET BY MOUTH TWICE A DAY What changed: how much to take    furosemide 20 MG tablet Commonly known as: LASIX Take 1 tablet (20 mg total) by mouth daily.    levothyroxine 75 MCG tablet Commonly known as: SYNTHROID Take 75 mcg by mouth daily before breakfast.     Magnesium 200 MG Tabs Take 200 mg by mouth daily.    nitroGLYCERIN 0.4 MG SL tablet Commonly known as: NITROSTAT Place 0.4 mg under the tongue every 5 (five) minutes as needed for chest pain.    Ocuvite Adult 50+ Caps Take 1 capsule by mouth daily.    QUEtiapine 100 MG tablet Commonly known as: SEROQUEL Take 100 mg by mouth at bedtime.    spironolactone 25 MG tablet Commonly known as: ALDACTONE Take 0.5 tablets (12.5 mg total) by mouth every Monday, Wednesday, and Friday.    Systane Balance 0.6 % Soln Generic drug: Propylene Glycol Place 1 drop into both eyes daily as needed (dry eyes).    valsartan 160 MG tablet Commonly known as: DIOVAN Take 160 mg by mouth daily.    Relevant Imaging Results:  Relevant Lab Results:   Additional Information SSN: [id number]  Geralynn Ochs, LCSW

## 2022-08-01 LAB — GLUCOSE, CAPILLARY
Glucose-Capillary: 115 mg/dL — ABNORMAL HIGH (ref 70–99)
Glucose-Capillary: 189 mg/dL — ABNORMAL HIGH (ref 70–99)

## 2022-08-01 MED ORDER — SPIRONOLACTONE 25 MG PO TABS: 12.5000 mg | ORAL_TABLET | Freq: Every day | ORAL | 3 refills | Status: DC

## 2022-08-01 MED ORDER — AMLODIPINE BESYLATE 10 MG PO TABS: 10.0000 mg | ORAL_TABLET | Freq: Every day | ORAL | Status: DC

## 2022-08-01 MED ORDER — AMLODIPINE BESYLATE 10 MG PO TABS
10.0000 mg | ORAL_TABLET | Freq: Every day | ORAL | Status: DC
  Administered 2022-08-01: 10 mg via ORAL
  Filled 2022-08-01: qty 1

## 2022-08-01 NOTE — TOC Transition Note (Signed)
Transition of Care [facility]) - CM/SW Discharge Note   Patient Details  Name: Chelsea Fuller MRN: [id number] Date of Birth: 07-21-1924  [facility] CM/SW Contact:  Geralynn Ochs, LCSW Phone Number: 08/01/2022, 2:07 PM   Clinical Narrative:   CSW confirmed with MD that patient now medically stable to discharge to ALF. CSW sent updated discharge information to [facility] and confirmed receipt. CSW updated daughter, Ilona Sorrel, about discharge and she is in agreement. Transport arranged with [facility] for next available.  Nurse to call report to [phone].    Final next level of care: Assisted Living Barriers to Discharge: Barriers Resolved   Patient Goals and CMS Choice Patient states their goals for this hospitalization and ongoing recovery are:: patient unable to participate in goal setting, not oriented CMS Medicare.gov Compare Post Acute Care list provided to:: Patient Represenative (must comment) Choice offered to / list presented to : Adult Children  Discharge Placement              Patient chooses bed at:  [facility]) Patient to be transferred to facility by: [place] Name of family member notified: [name] Patient and family notified of of transfer: 07/31/22  Discharge Plan and Services     Post Acute Care Choice: [place] Arranged: PT, OT [facility] Agency: [facility] Date [facility] Agency Contacted: 07/31/22   Representative spoke with at [place]: [place] ([place]) Interventions     Readmission Risk Interventions     No data to display

## 2022-08-01 NOTE — Plan of Care (Signed)
Pt was pleasant at the beginning of the shift. Has slept most of the night. Daughter called last night and I talked to pt about talking to her daughter and she would see her tomorrow (today) at the SNF when she gets there and her sister Deneise Lever will see her. Pt smiled and then started talking about her sister and all the siblings she has. Pt didn't eat dinner tonight again.

## 2022-08-01 NOTE — Progress Notes (Signed)
Occupational Therapy Treatment Patient Details Name: Chelsea Fuller MRN: [id number] DOB: 1924-05-31 Today's Date: 08/01/2022   History of present illness Pt is a 88 y/o female who presented with slurred speech and AMS. MRI negative for acute infarct. EEG pending. PMH: a fib, CVA, CAD with STEMI, HTN, CKDIII   OT comments  Pt supine in bed and agreeable to OT session. Pt remains disoriented, but pleasantly confused.  Following simple commands with increased time, poor awareness of deficits or safety. Completing transfers with mod assist using RW due to posterior lean, LB dressing with total assist and LB bathing with mod assist sitting EOB simulated. Continue to recommend SNF.   Recommendations for follow up therapy are one component of a multi-disciplinary discharge planning process, led by the attending physician.  Recommendations may be updated based on patient status, additional functional criteria and insurance authorization.    Follow Up Recommendations  Skilled nursing-short term rehab (<3 hours/day)    Assistance Recommended at Discharge Frequent or constant Supervision/Assistance  Patient can return home with the following  A lot of help with walking and/or transfers;A lot of help with bathing/dressing/bathroom   Equipment Recommendations  Other (comment) (defer)    Recommendations for Other Services      Precautions / Restrictions Precautions Precautions: Fall Restrictions Weight Bearing Restrictions: No       Mobility Bed Mobility Overal bed mobility: Needs Assistance Bed Mobility: Supine to Sit, Sit to Supine     Supine to sit: Min assist Sit to supine: Mod assist   General bed mobility comments: min assist for trunk support to ascned, mod assist to return BLEs back to bed.  Increased time and cueing required for sequencing    Transfers Overall transfer level: Needs assistance Equipment used: Rolling walker (2 wheels) Transfers: Sit to/from Stand Sit to  Stand: Mod assist           General transfer comment: to power up and steady from EOB, heavy posterior lean     Balance Overall balance assessment: Needs assistance Sitting-balance support: No upper extremity supported, Feet supported Sitting balance-Leahy Scale: Fair Sitting balance - Comments: min guard   Standing balance support: Bilateral upper extremity supported, During functional activity Standing balance-Leahy Scale: Poor Standing balance comment: reliant on RW and external support                           ADL either performed or assessed with clinical judgement   ADL Overall ADL's : Needs assistance/impaired             Lower Body Bathing: Moderate assistance;Sitting/lateral leans Lower Body Bathing Details (indicate cue type and reason): simulated donning lotion EOB     Lower Body Dressing: Maximal assistance;Sit to/from stand Lower Body Dressing Details (indicate cue type and reason): assist for socks, mod assist sit to stand with posterior lean Toilet Transfer: Moderate assistance;Rolling walker (2 wheels) Toilet Transfer Details (indicate cue type and reason): simulated in room         Functional mobility during ADLs: Rolling walker (2 wheels);Moderate assistance      Extremity/Trunk Assessment              Vision   Additional Comments: appears WFL, no formally assessed   Perception     Praxis      Cognition Arousal/Alertness: Awake/alert Behavior During Therapy: WFL for tasks assessed/performed Overall Cognitive Status: Impaired/Different from baseline Area of Impairment: Orientation, Following commands, Memory, Awareness, Problem solving  Orientation Level: Disoriented to, Place, Person, Time, Situation   Memory: Decreased short-term memory Following Commands: Follows one step commands with increased time   Awareness: Intellectual Problem Solving: Slow processing, Decreased initiation, Difficulty  sequencing, Requires verbal cues, Requires tactile cues General Comments: pleasantly confused. reports she is 88 years old.  follows simple commands but poor safety        Exercises      Shoulder Instructions       General Comments      Pertinent Vitals/ Pain       Pain Assessment Pain Assessment: Faces Faces Pain Scale: No hurt Pain Intervention(s): Monitored during session  Home Living                                          Prior Functioning/Environment              Frequency  Min 2X/week        Progress Toward Goals  OT Goals(current goals can now be found in the care plan section)  Progress towards OT goals: Progressing toward goals  Acute Rehab OT Goals Patient Stated Goal: none Time For Goal Achievement: 08/06/22 Potential to Achieve Goals: [place] Discharge plan remains appropriate;Frequency remains appropriate    Co-evaluation                 AM-PAC OT "6 Clicks" Daily Activity     Outcome Measure   Help from another person eating meals?: A Little Help from another person taking care of personal grooming?: A Little Help from another person toileting, which includes using toliet, bedpan, or urinal?: A Lot Help from another person bathing (including washing, rinsing, drying)?: A Lot Help from another person to put on and taking off regular upper body clothing?: A Little Help from another person to put on and taking off regular lower body clothing?: A Lot 6 Click Score: 15    End of Session Equipment Utilized During Treatment: Gait belt;Rolling walker (2 wheels)  OT Visit Diagnosis: Other abnormalities of gait and mobility (R26.89);Other symptoms and signs involving cognitive function   Activity Tolerance Patient tolerated treatment well   Patient Left in bed;with call bell/phone within reach;with bed alarm set   Nurse Communication Mobility status        Time: 7253-6644 OT Time Calculation (min): 13  min  Charges: OT General Charges $OT Visit: 1 Visit OT Treatments $Self Care/Home Management : 8-22 mins  [place] Office [phone]   Delight Stare 08/01/2022, 1:05 PM

## 2022-08-01 NOTE — Progress Notes (Signed)
Report called to Ambulance person at [place].

## 2022-08-01 NOTE — Discharge Summary (Addendum)
Name: Chelsea Fuller MRN: [id number] DOB: [birth year] 88 y.o. PCP: Bonnita Nasuti, MD  Date of Admission: 07/22/2022  1:28 PM Date of Discharge: 08/01/2022 Attending Physician: Angelica Pou, MD  Discharge Diagnosis: 1. Principal Problem:   Acute encephalopathy (delirium), resolved Active Problems:   UTI (urinary tract infection), treated   AKI (acute kidney injury) ([place]), resolved   Chronic kidney disease,    Isolated systolic hypertension   Chronic volume depletion, improved (lasix discontinued)   Discharge Medications: Allergies as of 08/01/2022   No Known Allergies      Medication List     STOP taking these medications    furosemide 20 MG tablet Commonly known as: LASIX   Macrobid 100 MG capsule Generic drug: nitrofurantoin (macrocrystal-monohydrate)       TAKE these medications    acetaminophen 500 MG tablet Commonly known as: TYLENOL Take 500 mg by mouth at bedtime as needed for mild pain.   ALPRAZolam 0.5 MG tablet Commonly known as: XANAX Take 0.5 mg by mouth daily as needed for anxiety.   amLODipine 10 MG tablet Commonly known as: NORVASC Take 1 tablet (10 mg total) by mouth daily. Start taking on: August 02, 2022   busPIRone 10 MG tablet Commonly known as: BUSPAR Take 10 mg by mouth 3 (three) times daily.   carvedilol 3.125 MG tablet Commonly known as: COREG TAKE 1 TABLET BY MOUTH 2 TIMES DAILY WITH A MEAL.   Eliquis 2.5 MG Tabs tablet Generic drug: apixaban TAKE 1 TABLET BY MOUTH TWICE A DAY What changed: how much to take   levothyroxine 75 MCG tablet Commonly known as: SYNTHROID Take 75 mcg by mouth daily before breakfast.   Magnesium 200 MG Tabs Take 200 mg by mouth daily.   nitroGLYCERIN 0.4 MG SL tablet Commonly known as: NITROSTAT Place 0.4 mg under the tongue every 5 (five) minutes as needed for chest pain.   Ocuvite Adult 50+ Caps Take 1 capsule by mouth daily.   QUEtiapine 100 MG tablet Commonly known  as: SEROQUEL Take 100 mg by mouth at bedtime.   spironolactone 25 MG tablet Commonly known as: ALDACTONE Take 0.5 tablets (12.5 mg total) by mouth daily. What changed: when to take this   Systane Balance 0.6 % Soln Generic drug: Propylene Glycol Place 1 drop into both eyes daily as needed (dry eyes).   valsartan 160 MG tablet Commonly known as: DIOVAN Take 160 mg by mouth daily.        Disposition and follow-up:   Ms.Chelsea Fuller was discharged from [facility] in Good condition.  At the hospital follow up visit please address:  1.  Please assess need for xanax, patient was un-agitated with nighttime dose of seroquel and seroquel 50mg  PRN for agitation.   - Please assess need for long term use of large dose antipsychotics (seroquel 100).  Ideally this would be tapered down/off.  - Please follow patient's volume status. She also has a blister above her medial left ankle that needs to be wrapped for protection, and her heels are erythematous but still blanching. Also will need ENT follow up for nasal polyp noted on CT scan, incidental and asymptomatic.   - Will need ongoing B12 and Thiamine supplementation   2. Labs/ Imaging Needed at time of Follow-Up: BMP, B12, Thiamine    Hospital Course by problem list: #Multifactorial Toxic Metabolic Encephalopathy (delirium) #UTI  #Vitamin B12 and Thiamine Deficiency #Poor PO Intake Patient presented to the emergency department  via EMS for slurred speech.  Per her daughters, patient was at home and ate breakfast at 11 AM without any difficulty but tried to drink, pain daughters noticed that coffee was running down her mouth.  After this patient was confused and had trouble following any commands.  Her daughters also note that she has had poor in p.o. intake for the last few months.  Daughters also state at the time that she had just been diagnosed with a UTI.  On admission she had right facial drooping as well.   Brain MRI was done in the emergency department, which did not reveal any acute abnormality.  EEGs were then done to evaluate for seizures, first EEG showed no abnormality, second EEG was attempted however patient became agitated.  She was subsequently started on ceftriaxone IV, and labs were sent out for B1, B12, folate, RPR assessing reversible causes of altered mental status.  B12 was low normal, so we subsequently administered a dose of B12, thiamine was also low normal, so we replenish thiamine levels as well.  Patient showed improvement in mental status throughout her hospital course.  Antibiotic treatment was completed in the hospital.   #Agitation/Sundowning Patient has a history of sundowning around 5 PM, and her home regimen included Xanax 0.5 mg and Seroquel 100 mg which she will take daily.  Throughout her hospital course she would become agitated at night, and we initially started her on as needed Ativan and her Seroquel dose of 25 mg.  However she would continue to be irritable, and upon further chart review it was noted that benzodiazepines were not a chronic medication for her.  She was then switched to Haldol for 1 night, but continued to be agitated throughout the night.  She was then given Seroquel 50 mg scheduled at 6 PM, and also Seroquel 50 mg as needed for agitation which seemed to prevent any agitation episodes. Recommended to minimize and avoid benzodiazepines.   #Pulmonary edema in the setting of iatrogenic fluid resuscitation on admission Upon examination in the second day of patient stay in the hospital, it was noted she was using her accessory muscles for breathing.  Chest x-ray was then obtained which revealed pulmonary edema.  She was treated with IV Lasix 40 mg, and her work of breathing improved.   #Permanent A-fib Patient is a history of A-fib, rate controlled with Coreg 3.125 and anticoagulated with Eliquis 5 mg.  Pulses have been stable throughout her hospital course.   Continue these medications upon discharge.   #AKI on CKD Patient's creatinine level upon admission was 1.8, however throughout her hospital course with fluid initiation and stopping some anti-hypertensive medications, creatinine slowly trended down to her baseline of 1.5.   #Hypertension Patient has a history of hypertension, was previously taking spironolactone 12.5 mg every other day and losartan 165 mg daily however initially patient had an AKI so antihypertensives were held. Blood pressures started to increase nightly, somewhere between systolic of [id number]. Orthostatic were then done, which was negative, so we introduced amlodipine 5 mg to her regimen. Can continue medications in the outpatient setting. Attempted to discharge yesterday, however patient was hypertensive upon discharge. We restarted her spironolactone 12.5mg  daily, increased amlodipine to 10mg , and continuing coreg.    #71mm Polypoid Soft Tissue in left Nasal passage Incidental findings on CT, would recommend outpatient ENT follow-up if further evaluation is desired.  THis is asymptomatic.   #Heel Erythema and R Foot Pain  On physical exam, patient was noted to  have erythematous blanching on the heels bilaterally. She has also been complaining of pain in her digits and across the dorsum of the right foot without objective findings. Would recommend following up and considering tylenol. Would recommend using heel protectors for pressure, and wrapping up blister.   Discharge Exam:   BP (!) 169/82 (BP Location: Left Arm)   Pulse 85   Temp 97.6 F (36.4 C) (Oral)   Resp (!) 22   Wt 57.6 kg   SpO2 95%   BMI 19.89 kg/m  Discharge exam:  Constitutional: AAOx2, able to say name and city, Elderly woman, appears at baseline, resting comfortably in bed, in no acute distress  Cardiovascular: RRR, no murmurs, rubs, or gallops Pulmonary/Chest: Normal work of breathing on room air, lungs clear to auscultation bilaterally  Abdominal:  Soft, non-tender, non-distended  Skin: Decreased skin turgor, doughy skin on RUE Extremities: Blister medial lower L leg,  ecchymosis present bilaterally, erythematous blanching heels.   Pertinent Labs, Studies, and Procedures:     Latest Ref Rng & Units 07/23/2022    2:15 AM 07/22/2022    1:33 PM 07/22/2022    1:32 PM  CBC  WBC 4.0 - 10.5 K/uL 6.9   4.4   Hemoglobin 12.0 - 15.0 g/dL 12.7  12.9  12.8   Hematocrit 36.0 - 46.0 % 40.5  38.0  40.0   Platelets 150 - 400 K/uL 139   144        Latest Ref Rng & Units 07/30/2022   12:57 AM 07/29/2022    6:46 AM 07/28/2022    3:39 AM  BMP  Glucose 70 - 99 mg/dL 118  96  111   BUN 8 - 23 mg/dL 13  13  14    Creatinine 0.44 - 1.00 mg/dL 1.20  1.25  1.31   Sodium 135 - 145 mmol/L 142  143  141   Potassium 3.5 - 5.1 mmol/L 4.1  3.6  3.5   Chloride 98 - 111 mmol/L 108  109  106   CO2 22 - 32 mmol/L 24  25  24    Calcium 8.9 - 10.3 mg/dL 8.7  8.8  8.7      EEG adult  Result Date: 07/23/2022 Lora Havens, MD     07/23/2022  4:22 PM Patient Name: LOMA PFISTERER MRN: [id number] Epilepsy Attending: Lora Havens Referring Physician/Provider: Greta Doom, MD Date: 07/23/2022 Duration: 29.52 mins Patient history: 88yo F h/o CHF, CAD, STEMI, Afib on Eliquis, prior L MCA infarct presents for evaluation of confusion and right sided facial droop.EEG to evaluate for seizure Level of alertness: Awake AEDs during EEG study: None Technical aspects: This EEG study was done with scalp electrodes positioned according to the 10-20 International system of electrode placement. Electrical activity was reviewed with band pass filter of 1-70Hz , sensitivity of 7 uV/mm, display speed of 50mm/sec with a 60Hz  notched filter applied as appropriate. EEG data were recorded continuously and digitally stored.  Video monitoring was available and reviewed as appropriate. Description: No clear posterior dominant rhythm was seen. EEG showed continuous generalized 3 to 6  Hz theta-delta slowing. Hyperventilation and photic stimulation were not performed.   Of note, study was technically difficult due to significant myogenic and movement artifact. ABNORMALITY - Continuous slow, generalized IMPRESSION: This technically difficult study is suggestive of moderate diffuse encephalopathy, nonspecific etiology. No seizures or epileptiform discharges were seen throughout the recording. Lack of epileptiform activity on interictal EEG does not exclude the diagnosis of  epilepsy. If suspicion for interictal activity remains a concern, a prolonged study including sleep can  be considered. Priyanka O Yadav   US RENAL  Result Date: 07/23/2022 CLINICAL DATA:  Acute kidney injury, cystic masses noted in the kidneys on previous imaging. EXAM: RENAL / URINARY TRACT ULTRASOUND COMPLETE COMPARISON:  January of 2019 CT of the abdomen and pelvis. FINDINGS: Right Kidney: Renal measurements: 8.2 x 3.6 x 4.2 cm = volume: 64.5 mL. 2.1 x 2.0 x 2.2 cm cyst in the upper pole the RIGHT kidney. Smaller renal sinus cyst as well not as well characterized but seen on imaging as far back as 2019 measuring approximately 13 mm. No dedicated imaging follow-up is recommended for these findings. No hydronephrosis. Mild parenchymal thinning. Mild increased cortical echogenicity. Left Kidney: Renal measurements: 8.3 x 3.7 x 3.6 cm = volume: 58.7 mL. Mild parenchymal thinning. Mild increased cortical echogenicity. No hydronephrosis. No visible calculi. Bladder: Appears normal for degree of bladder distention. Other: None. IMPRESSION: Signs of medical renal disease and RIGHT renal cysts without hydronephrosis. Electronically Signed   By: Zetta Bills M.D.   On: 07/23/2022 09:59   EEG adult  Result Date: 07/22/2022 Derek Jack, MD     07/22/2022  7:05 PM Routine EEG Report SHANIRA GUSTER is a 88 y.o. female with a history of aphasia who is undergoing an EEG to evaluate for seizures. Report: This EEG was acquired  with electrodes placed according to the International 10-20 electrode system (including Fp1, Fp2, F3, F4, C3, C4, P3, P4, O1, O2, T3, T4, T5, T6, A1, A2, Fz, Cz, Pz). The following electrodes were missing or displaced: none. The occipital dominant rhythm was 8.5 Hz. This activity is reactive to stimulation. Drowsiness was manifested by background fragmentation; deeper stages of sleep were not identified. There was continuous EMG artifact in the bifrontal L>R areas preventing adequate evaluation of those regions. In the posterior regions, no epileptiform abnormalities were noted. Photic stimulation and hyperventilation were not performed. Impression: This EEG was obtained while awake and drowsy and is normal in the posterior regions. EMG artifact prevents evaluation of the bifrontal L>R regions which includes the area of interest for aphasia. If clinical suspicion for seizures remains please order repeat EEG.   Clinical Correlation: Normal EEGs, however, do not rule out epilepsy. Su Monks, MD [facility] [phone] If 7pm- 7am, please page neurology on call as listed in [place].   MR BRAIN WO CONTRAST  Result Date: 07/22/2022 CLINICAL DATA:  Provided history: Neuro deficit, acute, stroke suspected. EXAM: MRI HEAD WITHOUT CONTRAST TECHNIQUE: Multiplanar, multiecho pulse sequences of the brain and surrounding structures were obtained without intravenous contrast. COMPARISON:  Noncontrast head CT and CT angiogram head/neck performed earlier today 07/22/2022. FINDINGS: Intermittently motion degraded examination, limiting evaluation. Most notably, the sagittal T1 weighted sequence is moderate to severely motion degraded, the axial SWI sequence is mild to moderately motion degraded, the axial T1 weighted sequences severely motion degraded and the coronal T2 sequence is severely motion degraded. Brain: Moderate generalized cerebral atrophy. Chronic left MCA territory cortical/subcortical infarcts within  the left frontal lobe, left insula and left parietal lobe. Chronic lacunar infarcts within the right basal ganglia and thalamus. Tiny chronic infarcts within the bilateral cerebellar hemispheres. There is no acute infarct. Within the limitations of motion degradation, no appreciable intracranial mass, chronic intracranial blood products or extra-axial fluid collection. No midline shift. Vascular: Maintained flow voids within the proximal large arterial vessels. Skull and upper cervical spine: No focal suspicious  marrow lesion. Apparent C3-C4 vertebral body ankylosis. Incompletely assessed cervical spondylosis. Sinuses/Orbits: No mass or acute finding within the imaged orbits. Prior bilateral ocular lens replacement. Large fluid level, and background mild mucosal thickening, within the left maxillary sinus. The left sphenoid sinus is completely opacified by inspissated secretions. Mild mucosal thickening within the anterior left ethmoid air cells. Other: 18 mm T2 hypointense polypoid soft tissue focus within the left nasal passage. IMPRESSION: 1. Intermittently motion degraded examination, as described. 2. No evidence of acute intracranial abnormality. Specifically, the diffusion-weighted imaging is of good quality and there is no evidence of acute infarct. 3. Parenchymal atrophy, chronic small vessel ischemic disease and chronic infarcts as described. 4. Paranasal sinus disease, as outlined. 5. 18 mm polypoid soft tissue focus within the left nasal passage. Direct visualization recommended. Electronically Signed   By: Kellie Simmering D.O.   On: 07/22/2022 16:38   CT ANGIO HEAD NECK W WO CM (CODE STROKE)  Result Date: 07/22/2022 CLINICAL DATA:  Neuro deficit, acute, stroke suspected. EXAM: CT ANGIOGRAPHY HEAD AND NECK TECHNIQUE: Multidetector CT imaging of the head and neck was performed using the standard protocol during bolus administration of intravenous contrast. Multiplanar CT image reconstructions and MIPs  were obtained to evaluate the vascular anatomy. Carotid stenosis measurements (when applicable) are obtained utilizing NASCET criteria, using the distal internal carotid diameter as the denominator. RADIATION DOSE REDUCTION: This exam was performed according to the departmental dose-optimization program which includes automated exposure control, adjustment of the mA and/or kV according to patient size and/or use of iterative reconstruction technique. CONTRAST:  Administered contrast not known at this time. COMPARISON:  Noncontrast head CT performed earlier today. FINDINGS: CTA NECK FINDINGS Aortic arch: Common origin of the innominate and left common carotid arteries. Atherosclerotic plaque within the visualized aortic arch and proximal major branch vessels of the neck. No hemodynamically significant innominate or proximal subclavian artery stenosis. Right carotid system: CCA and ICA patent within the neck without hemodynamically significant stenosis (50% or greater). Atherosclerotic plaque about the carotid bifurcation and within the proximal ICA. Left carotid system: CCA and ICA patent within the neck without hemodynamically significant stenosis (50% or greater). Atherosclerotic plaque about the carotid bifurcation and within the proximal ICA. Vertebral arteries: Vertebral arteries patent within the neck. Nonstenotic atherosclerotic plaque within both vessels. The left vertebral artery is slightly dominant. Skeleton: Cervical spondylosis. No acute fracture or aggressive osseous lesion. Other neck: No neck mass or cervical lymphadenopathy. Upper chest: No consolidation within the imaged lung apices. Review of the MIP images confirms the above findings CTA HEAD FINDINGS Anterior circulation: The intracranial internal carotid arteries are patent. Atherosclerotic plaque within both vessels with no more than mild stenosis. The M1 middle cerebral arteries are patent. No M2 proximal branch occlusion is identified.  Atherosclerotic irregularity of the M2 and more distal MCA vessels, bilaterally. Most notably, there are sites of moderate stenosis within multiple proximal M2 left MCA vessels (for instance as seen on series 12, image 27). The anterior cerebral arteries are patent. No intracranial aneurysm is identified. Posterior circulation: The intracranial vertebral arteries are patent. Atherosclerotic plaque within the dominant intracranial left vertebral artery with no more than mild stenosis. Nonstenotic atherosclerotic plaque within the non dominant intracranial right vertebral artery. The basilar artery is patent. The posterior cerebral arteries are patent. Atherosclerotic irregularity of both vessels. Most notably, there is a severe stenosis within the right PCA at the P2/P3 junction. The right PCA is fetal in origin. A left posterior communicating artery  is present. Venous sinuses: Within the limitations of contrast timing, no convincing thrombus. Anatomic variants: As described. Review of the MIP images confirms the above findings No emergent large vessel occlusion identified. These results were communicated to Dr. Derry Lory At 2:00 pmon 10/1/2023by text page via the [facility] messaging system. IMPRESSION: CTA neck: 1. The common carotid, internal carotid and vertebral arteries are patent within the neck without hemodynamically significant stenosis. Atherosclerotic plaque, as described. 2.  Aortic Atherosclerosis (ICD10-I70.0). CTA head: 1. No intracranial large vessel occlusion is identified. 2. Intracranial atherosclerotic disease, most notably as follows. 3. Sites of moderate stenosis within multiple proximal M2 left MCA vessels. 4. Severe stenosis within the right PCA at the P2/P3 junction. Electronically Signed   By: Jackey Loge D.O.   On: 07/22/2022 14:00   CT HEAD CODE STROKE WO CONTRAST  Result Date: 07/22/2022 CLINICAL DATA:  Code stroke. Neuro deficit, acute, stroke suspected. EXAM: CT HEAD WITHOUT CONTRAST  TECHNIQUE: Contiguous axial images were obtained from the base of the skull through the vertex without intravenous contrast. RADIATION DOSE REDUCTION: This exam was performed according to the departmental dose-optimization program which includes automated exposure control, adjustment of the mA and/or kV according to patient size and/or use of iterative reconstruction technique. COMPARISON:  Report from prior head CT 10/04/2013 (images unavailable). Head CT 11/18/2008. FINDINGS: Brain: Moderate cerebral atrophy. Chronic left MCA vascular territory cortical/subcortical infarcts within the left frontal lobe, left insula and left parietooccipital lobes. Background moderate patchy and ill-defined hypoattenuation within the cerebral white matter, nonspecific but compatible chronic small vessel ischemic disease. Chronic appearing lacunar infarcts within the right deep gray nuclei and left thalamus. There is no acute intracranial hemorrhage. No acute demarcated cortical infarct. No extra-axial fluid collection. No evidence of an intracranial mass. No midline shift. Vascular: No hyperdense vessel.  Atherosclerotic calcifications. Skull: No fracture or aggressive osseous lesion. Sinuses/Orbits: No mass or acute finding within the imaged orbits. Complete opacification of the left sphenoid sinus with associated chronic reactive osteitis. Extensive partial opacification of the left maxillary sinus. 18 mm polypoid soft tissue focus within the left nasal passage ASPECTS [facility] Stroke Program Early CT Score) - Ganglionic level infarction (caudate, lentiform nuclei, internal capsule, insula, M1-M3 cortex): 7 - Supraganglionic infarction (M4-M6 cortex): 3 Total score (0-10 with 10 being normal): 10 (when discounting chronic infarcts). No evidence of acute intracranial abnormality. These results were communicated to Dr. Derry Lory at 1:45 pmon 10/1/2023by text page via the [facility] messaging system. IMPRESSION: No evidence of acute  intracranial abnormality. Parenchymal atrophy, chronic small vessel disease and chronic infarcts as described. Paranasal sinus disease, as outlined. 18 mm polypoid soft tissue focus within the left nasal passage. Direct visualization recommended. Electronically Signed   By: Jackey Loge D.O.   On: 07/22/2022 13:48     Discharge Instructions: Discharge Instructions     Call MD for:  difficulty breathing, headache or visual disturbances   Complete by: As directed    Call MD for:  difficulty breathing, headache or visual disturbances   Complete by: As directed    Call MD for:  extreme fatigue   Complete by: As directed    Call MD for:  hives   Complete by: As directed    Call MD for:  persistant dizziness or light-headedness   Complete by: As directed    Call MD for:  persistant nausea and vomiting   Complete by: As directed    Call MD for:  redness, tenderness, or signs of infection (pain,  swelling, redness, odor or green/yellow discharge around incision site)   Complete by: As directed    Call MD for:  severe uncontrolled pain   Complete by: As directed    Call MD for:  temperature >100.4   Complete by: As directed    Diet - low sodium heart healthy   Complete by: As directed    Diet - low sodium heart healthy   Complete by: As directed    Diet - low sodium heart healthy   Complete by: As directed    Increase activity slowly   Complete by: As directed    Increase activity slowly   Complete by: As directed    Increase activity slowly   Complete by: As directed        Signed: Drucie Opitz, MD 08/01/2022, 2:25 PM   Pager: [phone]

## 2022-08-01 NOTE — Progress Notes (Signed)
Mobility Specialist: Progress Note   08/01/22 1600  Mobility  Activity Ambulated with assistance to bathroom  Level of Assistance Minimal assist, patient does 75% or more  Assistive Device Front wheel walker  Distance Ambulated (ft) 15 ft  Activity Response Tolerated well  $Mobility charge 1 Mobility   Pt received attempting to exit the bed stating she needed to go to the BR. Upon standing pt had already started BM. Assisted pt to the BR. RN present in the room to assist pt with pericare.   [name] Mobility Specialist Secure Chat Only

## 2022-08-01 NOTE — NC FL2 (Signed)
[place] LEVEL OF CARE SCREENING TOOL     IDENTIFICATION  Patient Name: Chelsea Fuller Birthdate: 1924-03-26 Sex: female Admission Date (Current Location): 07/22/2022  [facility] and [place] Number:  Herbalist and Address:  [facility], [place] [address] [place], [place] [address]      Provider Number: [id number]  Attending Physician Name and Address:  Angelica Pou, MD  Relative Name and Phone Number:       Current Level of Care: Hospital Recommended Level of Care: [place] Prior Approval Number:    Date Approved/Denied:   PASRR Number: [id number]  Discharge Plan: Other (Comment) (ALF)    Current Diagnoses: Patient Active Problem List   Diagnosis Date Noted   Elevated blood pressure reading    Slurred speech 07/24/2022   UTI (urinary tract infection) 07/23/2022   AKI (acute kidney injury) ([place]) 07/23/2022   Acute encephalopathy 07/22/2022   Aphasia    Dyspnea 09/30/2018   PICC (peripherally inserted central catheter) in place 11/28/2017   Abnormal LFTs 11/08/2017   Thrombocytopenia ([place]) 11/08/2017   Normocytic anemia 11/08/2017   Elevated AST (SGOT) 11/07/2017   MRSA bacteremia 11/05/2017   Weakness generalized 11/04/2017   Acute cystitis without hematuria    Ischemic cardiomyopathy 10/30/2017   CAD (coronary artery disease)-DES/LAD 2017 10/30/2017   Hypoxia 10/30/2017   Acute on chronic systolic heart failure ([place]) 10/30/2017   CKD (chronic kidney disease) stage 3, GFR 30-59 ml/min (HCC) 10/30/2017   Fever 10/30/2017   Accelerated hypertension 09/24/2016   Acute on chronic diastolic CHF (congestive heart failure) ([place]) 09/23/2016   Old anterior ST elevation MI    Chronic atrial fibrillation    Epistaxis 10/04/2013   Anemia 10/04/2013   Nasal fracture 10/04/2013   History of CVA (cerebrovascular accident) 10/04/2013   HTN (hypertension) 10/04/2013   Hypothyroidism 10/04/2013     Orientation RESPIRATION BLADDER Height & Weight     Self, Time, Situation  Normal Incontinent Weight: 126 lb 15.8 oz (57.6 kg) Height:     BEHAVIORAL SYMPTOMS/MOOD NEUROLOGICAL BOWEL NUTRITION STATUS      Continent Diet  AMBULATORY STATUS COMMUNICATION OF NEEDS Skin   Limited Assist Verbally Normal                       Personal Care Assistance Level of Assistance  Bathing, Dressing, Feeding Bathing Assistance: Limited assistance Feeding assistance: Limited assistance Dressing Assistance: Limited assistance     Functional Limitations Info  Speech, Hearing, Sight Sight Info: Adequate Hearing Info: Adequate Speech Info: Adequate    SPECIAL CARE FACTORS FREQUENCY  PT (By licensed PT), OT (By licensed OT)     PT Frequency: 3x/wk with home health OT Frequency: 3x/wk with home health            Contractures Contractures Info: Not present    Additional Factors Info  Code Status, Allergies Code Status Info: DNR Allergies Info: No known allergies             Discharge Medications: STOP taking these medications     Macrobid 100 MG capsule Generic drug: nitrofurantoin (macrocrystal-monohydrate)           TAKE these medications     acetaminophen 500 MG tablet Commonly known as: TYLENOL Take 500 mg by mouth at bedtime as needed for mild pain.    ALPRAZolam 0.5 MG tablet Commonly known as: XANAX Take 0.5 mg by mouth daily as needed for  anxiety.    amLODipine 10 MG tablet Commonly known as: NORVASC Take 1 tablet (10 mg total) by mouth daily. Start taking on: August 02, 2022    busPIRone 10 MG tablet Commonly known as: BUSPAR Take 10 mg by mouth 3 (three) times daily.    carvedilol 3.125 MG tablet Commonly known as: COREG TAKE 1 TABLET BY MOUTH 2 TIMES DAILY WITH A MEAL.    Eliquis 2.5 MG Tabs tablet Generic drug: apixaban TAKE 1 TABLET BY MOUTH TWICE A DAY What changed: how much to take    furosemide 20 MG tablet Commonly known as:  LASIX Take 1 tablet (20 mg total) by mouth daily.    levothyroxine 75 MCG tablet Commonly known as: SYNTHROID Take 75 mcg by mouth daily before breakfast.    Magnesium 200 MG Tabs Take 200 mg by mouth daily.    nitroGLYCERIN 0.4 MG SL tablet Commonly known as: NITROSTAT Place 0.4 mg under the tongue every 5 (five) minutes as needed for chest pain.    Ocuvite Adult 50+ Caps Take 1 capsule by mouth daily.    QUEtiapine 100 MG tablet Commonly known as: SEROQUEL Take 100 mg by mouth at bedtime.    spironolactone 25 MG tablet Commonly known as: ALDACTONE Take 0.5 tablets (12.5 mg total) by mouth every Monday, Wednesday, and Friday.    Systane Balance 0.6 % Soln Generic drug: Propylene Glycol Place 1 drop into both eyes daily as needed (dry eyes).    valsartan 160 MG tablet Commonly known as: DIOVAN Take 160 mg by mouth daily.    Relevant Imaging Results:  Relevant Lab Results:   Additional Information SSN: [id number]  Geralynn Ochs, LCSW

## 2022-08-01 NOTE — Progress Notes (Signed)
Mobility Specialist: Progress Note   08/01/22 1456  Mobility  Activity Ambulated with assistance to bathroom  Level of Assistance Moderate assist, patient does 50-74%  Assistive Device Front wheel walker  Distance Ambulated (ft) 30 ft  Activity Response Tolerated well  $Mobility charge 1 Mobility   Pt received attempting to get OOB and agreeable to mobility. Able to ambulate to BR, void successful, then back to bed. C/o general fatigue throughout. Pt back to bed after session with call bell and phone in reach.   [place] Jaden Batchelder Mobility Specialist Secure Chat Only

## 2022-08-01 NOTE — Care Management Important Message (Signed)
Important Message  Patient Details  Name: Chelsea Fuller MRN: [id number] Date of Birth: [birth year]   Medicare Important Message Given:  Yes     Jaynia Fendley 08/01/2022, 4:09 PM

## 2022-08-03 DIAGNOSIS — I361 Nonrheumatic tricuspid (valve) insufficiency: Secondary | ICD-10-CM | POA: Diagnosis not present

## 2022-08-03 DIAGNOSIS — I351 Nonrheumatic aortic (valve) insufficiency: Secondary | ICD-10-CM | POA: Diagnosis not present

## 2022-08-03 DIAGNOSIS — I340 Nonrheumatic mitral (valve) insufficiency: Secondary | ICD-10-CM | POA: Diagnosis not present

## 2022-08-03 DIAGNOSIS — I371 Nonrheumatic pulmonary valve insufficiency: Secondary | ICD-10-CM | POA: Diagnosis not present

## 2022-08-06 DIAGNOSIS — R001 Bradycardia, unspecified: Secondary | ICD-10-CM

## 2022-09-26 DIAGNOSIS — I4891 Unspecified atrial fibrillation: Secondary | ICD-10-CM | POA: Diagnosis not present

## 2022-09-26 DIAGNOSIS — I6389 Other cerebral infarction: Secondary | ICD-10-CM | POA: Diagnosis not present

## 2022-09-28 DIAGNOSIS — R001 Bradycardia, unspecified: Secondary | ICD-10-CM

## 2022-09-28 DIAGNOSIS — I509 Heart failure, unspecified: Secondary | ICD-10-CM

## 2022-09-28 DIAGNOSIS — R2990 Unspecified symptoms and signs involving the nervous system: Secondary | ICD-10-CM

## 2022-10-09 ENCOUNTER — Other Ambulatory Visit: Payer: Self-pay

## 2022-10-09 ENCOUNTER — Emergency Department (HOSPITAL_COMMUNITY): Payer: Medicare PPO

## 2022-10-09 ENCOUNTER — Emergency Department (HOSPITAL_COMMUNITY)
Admission: EM | Admit: 2022-10-09 | Discharge: 2022-10-09 | Disposition: A | Discharge status: Home / Self Care | Payer: Medicare PPO | Attending: Emergency Medicine | Admitting: Emergency Medicine

## 2022-10-09 DIAGNOSIS — Z7901 Long term (current) use of anticoagulants: Secondary | ICD-10-CM | POA: Diagnosis not present

## 2022-10-09 DIAGNOSIS — F0390 Unspecified dementia without behavioral disturbance: Secondary | ICD-10-CM | POA: Diagnosis not present

## 2022-10-09 DIAGNOSIS — M25571 Pain in right ankle and joints of right foot: Secondary | ICD-10-CM | POA: Diagnosis not present

## 2022-10-09 DIAGNOSIS — Z79899 Other long term (current) drug therapy: Secondary | ICD-10-CM | POA: Insufficient documentation

## 2022-10-09 DIAGNOSIS — R600 Localized edema: Secondary | ICD-10-CM | POA: Insufficient documentation

## 2022-10-09 DIAGNOSIS — R2991 Unspecified symptoms and signs involving the musculoskeletal system: Secondary | ICD-10-CM | POA: Diagnosis present

## 2022-10-09 NOTE — ED Notes (Signed)
Called Chelsea Fuller- daughter to come pick up her mother. She stated she will come or her other sister will come.

## 2022-10-09 NOTE — ED Notes (Signed)
ASO Ankle brace applied.

## 2022-10-09 NOTE — ED Provider Notes (Signed)
[facility] EMERGENCY DEPARTMENT Provider Note   CSN: [id number] Arrival date & time: 10/09/22  1027     History  Chief Complaint  Patient presents with   Ankle Injury    Chelsea Fuller is a 88 y.o. female.  HPI Patient with history of dementia, level 5 caveat. The patient herself denies complaints, but seemingly was sent here due to concern for possible ankle injury.  Patient can specify any details of what may have occurred, and there were no nursing home staff reports of recurrent either.  Patient seems to deny any complaints, but when asked about a wrist splint that is in place on the right side she does acknowledge that she fell previously.    Home Medications Prior to Admission medications   Medication Sig Start Date End Date Taking? Authorizing Provider  acetaminophen (TYLENOL) 500 MG tablet Take 500 mg by mouth at bedtime as needed for mild pain.     [provider]  ALPRAZolam Prudy Feeler) 0.5 MG tablet Take 0.5 mg by mouth daily as needed for anxiety. 07/05/22   [provider]  amLODipine (NORVASC) 10 MG tablet Take 1 tablet (10 mg total) by mouth daily. 08/02/22   Nooruddin, Jason Fila, MD  busPIRone (BUSPAR) 10 MG tablet Take 10 mg by mouth 3 (three) times daily.    [provider]  carvedilol (COREG) 3.125 MG tablet TAKE 1 TABLET BY MOUTH 2 TIMES DAILY WITH A MEAL. Patient taking differently: Take 3.125 mg by mouth 2 (two) times daily with a meal. 08/01/20   Georgie Chard D, NP  ELIQUIS 2.5 MG TABS tablet TAKE 1 TABLET BY MOUTH TWICE A DAY Patient taking differently: Take 2.5 mg by mouth 2 (two) times daily. 06/27/21   Lyn Records, MD  levothyroxine (SYNTHROID) 75 MCG tablet Take 75 mcg by mouth daily before breakfast.    [provider]  Magnesium 200 MG TABS Take 200 mg by mouth daily.    [provider]  Multiple Vitamins-Minerals (OCUVITE ADULT 50+) CAPS Take 1 capsule by mouth daily.    [provider]  nitroGLYCERIN (NITROSTAT) 0.4 MG SL tablet Place 0.4 mg under the tongue every 5 (five) minutes as needed for chest pain.  07/29/16   [provider]  Propylene Glycol (SYSTANE BALANCE) 0.6 % SOLN Place 1 drop into both eyes daily as needed (dry eyes).    [provider]  QUEtiapine (SEROQUEL) 100 MG tablet Take 100 mg by mouth at bedtime. 07/09/22   [provider]  spironolactone (ALDACTONE) 25 MG tablet Take 0.5 tablets (12.5 mg total) by mouth daily. 08/01/22   Nooruddin, Jason Fila, MD  valsartan (DIOVAN) 160 MG tablet Take 160 mg by mouth daily.    [provider]      Allergies    Patient has no known allergies.    Review of Systems   Review of Systems  Unable to perform ROS: Dementia    Physical Exam Updated Vital Signs BP 127/76 (BP Location: Right Arm)   Pulse 93   Temp (!) 97.4 F (36.3 C)   Resp 15   SpO2 96%  Physical Exam Vitals and nursing note reviewed.  Constitutional:      General: She is not in acute distress.    Appearance: She is well-developed.  HENT:     Head: Normocephalic and atraumatic.  Eyes:     Conjunctiva/sclera: Conjunctivae normal.  Cardiovascular:     Rate and Rhythm: Normal rate and regular  rhythm.  Pulmonary:     Effort: Pulmonary effort is normal. No respiratory distress.     Breath sounds: Normal breath sounds. No stridor.  Abdominal:     General: There is no distension.  Musculoskeletal:       Arms:       Legs:  Skin:    General: Skin is warm and dry.  Neurological:     Mental Status: She is alert and oriented to person, place, and time.     Cranial Nerves: No cranial nerve deficit.  Psychiatric:        Mood and Affect: Mood normal.     ED Results / Procedures / Treatments   Labs (all labs ordered are listed, but only abnormal results are displayed) Labs Reviewed - No data to display  EKG None  Radiology DG Ankle Complete Right  Result Date: 10/09/2022 CLINICAL DATA:   88 year old female with bruising and swelling, unknown injury. Dementia. EXAM: RIGHT ANKLE - COMPLETE 3+ VIEW COMPARISON:  Right ankle series 07/29/2022. FINDINGS: Bone mineralization is within normal limits for age. Calcified peripheral vascular disease. Maintained mortise joint alignment. Talar dome intact. No acute osseous abnormality identified. Calcaneus intact. Soft tissue swelling somewhat generalized. No soft tissue gas. IMPRESSION: 1. Soft tissue swelling with no no acute fracture or dislocation identified about the right ankle. 2. Calcified peripheral vascular disease. Electronically Signed   By: Genevie Ann M.D.   On: 10/09/2022 07:58    Procedures Procedures    Medications Ordered in ED Medications - No data to display  ED Course/ Medical Decision Making/ A&P                           Medical Decision Making Elderly female with dementia presents from nursing facility after possible fall.  Patient is awake, alert, denying complaints and is hemodynamically unremarkable, concern for ankle injury, absent other complaints, hemodynamic instability, reported change in behavior, no indication for labs.  X-ray reviewed, no evidence for acute fracture of the ankle, and with soft tissue swelling some suspicion for sprain versus strain.  Patient received immobilization, was returned to her nursing facility.  Amount and/or Complexity of Data Reviewed Independent Historian:     Details: Nursing home notes External Data Reviewed: notes. Radiology:  Decision-making details documented in ED Course.  Risk OTC drugs. Decision regarding hospitalization.  Final Clinical Impression(s) / ED Diagnoses Final diagnoses:  Acute right ankle pain     Carmin Muskrat, MD 10/09/22 1028

## 2022-10-09 NOTE — ED Notes (Signed)
Pt stated "it feels better already"

## 2022-10-09 NOTE — Discharge Instructions (Addendum)
As discussed, it is importantly follow-up with our orthopedic colleagues in about 1 week.  If you develop new, or concerning changes return here for additional evaluation.

## 2022-10-09 NOTE — ED Triage Notes (Signed)
Patient arrived with EMS from [facility] at [place] , staff noticed patient's right ankle bruise with swelling this morning , etiology unknown , patient unable to recall incident due to dementia .

## 2022-11-26 ENCOUNTER — Emergency Department (HOSPITAL_COMMUNITY): Payer: Medicare PPO

## 2022-11-26 ENCOUNTER — Emergency Department (HOSPITAL_COMMUNITY)
Admission: EM | Admit: 2022-11-26 | Discharge: 2022-11-26 | Disposition: A | Discharge status: Skilled Nursing Facility | Payer: Medicare PPO | Attending: Emergency Medicine | Admitting: Emergency Medicine

## 2022-11-26 DIAGNOSIS — S0990XA Unspecified injury of head, initial encounter: Secondary | ICD-10-CM | POA: Diagnosis present

## 2022-11-26 DIAGNOSIS — I509 Heart failure, unspecified: Secondary | ICD-10-CM | POA: Diagnosis not present

## 2022-11-26 DIAGNOSIS — N189 Chronic kidney disease, unspecified: Secondary | ICD-10-CM | POA: Insufficient documentation

## 2022-11-26 DIAGNOSIS — Z79899 Other long term (current) drug therapy: Secondary | ICD-10-CM | POA: Diagnosis not present

## 2022-11-26 DIAGNOSIS — R410 Disorientation, unspecified: Secondary | ICD-10-CM | POA: Diagnosis not present

## 2022-11-26 DIAGNOSIS — I4891 Unspecified atrial fibrillation: Secondary | ICD-10-CM | POA: Insufficient documentation

## 2022-11-26 DIAGNOSIS — I130 Hypertensive heart and chronic kidney disease with heart failure and stage 1 through stage 4 chronic kidney disease, or unspecified chronic kidney disease: Secondary | ICD-10-CM | POA: Diagnosis not present

## 2022-11-26 DIAGNOSIS — W19XXXA Unspecified fall, initial encounter: Secondary | ICD-10-CM | POA: Diagnosis not present

## 2022-11-26 DIAGNOSIS — I2510 Atherosclerotic heart disease of native coronary artery without angina pectoris: Secondary | ICD-10-CM | POA: Diagnosis not present

## 2022-11-26 DIAGNOSIS — F0390 Unspecified dementia without behavioral disturbance: Secondary | ICD-10-CM | POA: Diagnosis not present

## 2022-11-26 MED ORDER — HALOPERIDOL 1 MG PO TABS
2.0000 mg | ORAL_TABLET | Freq: Once | ORAL | Status: AC
  Administered 2022-11-26: 2 mg via ORAL
  Filled 2022-11-26 (×2): qty 2

## 2022-11-26 NOTE — ED Provider Notes (Signed)
[place] EMERGENCY DEPARTMENT AT [facility] Provider Note   CSN: [id number] Arrival date & time: 11/26/22  0093     History  Chief Complaint  Patient presents with   Chelsea Fuller    Chelsea Fuller is a 89 y.o. female.   Fall  Patient present after a fall.  Medical history includes dementia, atrial fibrillation, anemia, HTN, CHF, CAD, CKD.  History from patient is limited by her dementia.  History provided by skilled nursing facility and relayed to EMS is as follows: Patient was calling out for help with assistance to the bathroom.  She attempted to get up on her own and had a ground-level fall.  She did state that she struck her head while laying on the floor.  She is on Eliquis for history of atrial fibrillation.  On arrival, patient denies any current areas of pain.     Home Medications Prior to Admission medications   Medication Sig Start Date End Date Taking? Authorizing Provider  acetaminophen (TYLENOL) 500 MG tablet Take 500 mg by mouth daily as needed for headache, fever, moderate pain or mild pain.   Yes [provider]  ALPRAZolam Prudy Feeler) 0.5 MG tablet Take 0.5 mg by mouth See admin instructions. 2 entries on MAR: 0.5 mg once daily at bedtime + 0.5 mg during the day as needed for agitation   Yes [provider]  amLODipine (NORVASC) 5 MG tablet Take 5 mg by mouth daily.   Yes [provider]  atorvastatin (LIPITOR) 40 MG tablet Take 40 mg by mouth daily.   Yes [provider]  busPIRone (BUSPAR) 5 MG tablet Take 5 mg by mouth 3 (three) times daily.   Yes [provider]  ELIQUIS 2.5 MG TABS tablet TAKE 1 TABLET BY MOUTH TWICE A DAY Patient taking differently: Take 2.5 mg by mouth 2 (two) times daily. 06/27/21  Yes Lyn Records, MD  levothyroxine (SYNTHROID) 100 MCG tablet Take 100 mcg by mouth daily.   Yes [provider]  losartan (COZAAR) 100 MG tablet Take 100 mg by mouth daily.   Yes [provider]  magnesium oxide (MAG-OX) 400 (240 Mg) MG tablet Take 400 mg by mouth daily.   Yes [provider]  nitroGLYCERIN (NITROSTAT) 0.4 MG SL tablet Place 0.4 mg under the tongue every 5 (five) minutes x 3 doses as needed for chest pain.   Yes [provider]  Propylene Glycol (SYSTANE COMPLETE) 0.6 % SOLN Place 1 drop into both eyes daily as needed (dry eyes).   Yes [provider]  QUEtiapine (SEROQUEL) 25 MG tablet Take 25 mg by mouth at bedtime.   Yes [provider]  sertraline (ZOLOFT) 25 MG tablet Take 25 mg by mouth daily.   Yes [provider]  spironolactone (ALDACTONE) 25 MG tablet Take 0.5 tablets (12.5 mg total) by mouth daily. Patient taking differently: Take 12.5 mg by mouth every Monday, Wednesday, and Friday. 08/01/22  Yes Nooruddin, Jason Fila, MD  amLODipine (NORVASC) 10 MG tablet Take 1 tablet (10 mg total) by mouth daily. Patient not taking: Reported on 11/26/2022 08/02/22   Nooruddin, Jason Fila, MD  carvedilol (COREG) 3.125 MG tablet TAKE 1 TABLET BY MOUTH 2 TIMES DAILY WITH A MEAL. Patient not taking: Reported on 11/26/2022 08/01/20   Filbert Schilder, NP      Allergies    Ativan [lorazepam]    Review of Systems   Review of Systems  Unable to perform ROS: Dementia  Physical Exam Updated Vital Signs BP (!) 165/61   Pulse 73   Temp 98.2 F (36.8 C)   Resp 16   SpO2 96%  Physical Exam Vitals and nursing note reviewed.  Constitutional:      General: She is not in acute distress.    Appearance: Normal appearance. She is well-developed. She is not ill-appearing, toxic-appearing or diaphoretic.  HENT:     Head: Normocephalic and atraumatic.     Right Ear: External ear normal.     Left Ear: External ear normal.     Nose: Nose normal.     Mouth/Throat:     Mouth: Mucous membranes are moist.  Eyes:     Extraocular Movements: Extraocular movements intact.     Conjunctiva/sclera: Conjunctivae normal.  Neck:     Comments: Cervical  collar in place Cardiovascular:     Rate and Rhythm: Normal rate. Rhythm irregular.     Heart sounds: No murmur heard. Pulmonary:     Effort: Pulmonary effort is normal. No respiratory distress.     Breath sounds: No wheezing or rales.  Chest:     Chest wall: No tenderness.  Abdominal:     General: There is no distension.     Palpations: Abdomen is soft.     Tenderness: There is no abdominal tenderness.  Musculoskeletal:        General: No swelling, tenderness or deformity. Normal range of motion.     Cervical back: Neck supple.     Right lower leg: No edema.     Left lower leg: No edema.  Skin:    General: Skin is warm and dry.     Capillary Refill: Capillary refill takes less than 2 seconds.     Coloration: Skin is not jaundiced or pale.  Neurological:     General: No focal deficit present.     Mental Status: She is alert. Mental status is at baseline. She is disoriented.     Cranial Nerves: No cranial nerve deficit.     Sensory: No sensory deficit.     Motor: No weakness.     Coordination: Coordination normal.  Psychiatric:        Mood and Affect: Mood normal.        Behavior: Behavior normal.        Thought Content: Thought content normal.        Judgment: Judgment normal.     ED Results / Procedures / Treatments   Labs (all labs ordered are listed, but only abnormal results are displayed) Labs Reviewed - No data to display  EKG None  Radiology DG Pelvis Portable  Result Date: 11/26/2022 CLINICAL DATA:  Fall. EXAM: PORTABLE PELVIS 1-2 VIEWS COMPARISON:  09/15/2022. FINDINGS: No fracture.  No bone lesion. Bilateral hip joint space axial narrowing. SI joints and symphysis pubis are normally aligned. Skeletal structures are demineralized. Atherosclerotic vascular calcifications. Soft tissues otherwise unremarkable. IMPRESSION: No fracture or acute finding. Electronically Signed   By: Lajean Manes M.D.   On: 11/26/2022 13:09   CT HEAD WO CONTRAST  Result Date:  11/26/2022 CLINICAL DATA:  89 year old female with history of trauma from a fall. EXAM: CT HEAD WITHOUT CONTRAST CT CERVICAL SPINE WITHOUT CONTRAST TECHNIQUE: Multidetector CT imaging of the head and cervical spine was performed following the standard protocol without intravenous contrast. Multiplanar CT image reconstructions of the cervical spine were also generated. RADIATION DOSE REDUCTION: This exam was performed according to the departmental dose-optimization program which includes automated exposure  control, adjustment of the mA and/or kV according to patient size and/or use of iterative reconstruction technique. COMPARISON:  Brain MRI 07/22/2022. Head CT 07/22/2022. Cervical spine CT 10/04/2013. FINDINGS: CT HEAD FINDINGS Brain: Severe cerebral atrophy. Patchy and confluent areas of decreased attenuation are noted throughout the deep and periventricular white matter of the cerebral hemispheres bilaterally, compatible with chronic microvascular ischemic disease. Multiple old well-defined low-attenuation regions in the basal ganglia bilaterally, compatible with old lacunar infarcts. Additionally, there are areas of low attenuation in the left frontal and left parieto-occipital regions, similar to prior studies, related to areas of chronic encephalomalacia from prior left MCA/ACA territory and MCA/PCA territory watershed infarcts. No evidence of acute infarction, hemorrhage, hydrocephalus, extra-axial collection or mass lesion/mass effect. Vascular: No hyperdense vessel or unexpected calcification. Skull: Normal. Negative for fracture or focal lesion. Sinuses/Orbits: No acute finding. Other: None. CT CERVICAL SPINE FINDINGS Alignment: Normal. Skull base and vertebrae: No acute fracture. No primary bone lesion or focal pathologic process. Soft tissues and spinal canal: No prevertebral fluid or swelling. No visible canal hematoma. Disc levels: Severe multilevel degenerative disc disease throughout the cervical  spine, most pronounced at C3-C4, C4-C5, C5-C6 and C6-C7. Severe multilevel facet arthropathy bilaterally. Upper chest: Extensive pleuroparenchymal thickening, nodular architectural distortion and calcifications in the apices bilaterally, most compatible with areas of chronic post infectious or inflammatory scarring. Other: None. IMPRESSION: 1. No evidence of significant acute traumatic injury to the skull, brain or cervical spine. 2. Severe cerebral atrophy with extensive chronic microvascular ischemic changes in the cerebral white matter, old left watershed infarcts and numerous old bilateral basal ganglia lacunar infarcts, as detailed above. 3. Severe multilevel degenerative disc disease and cervical spondylosis, as above. Electronically Signed   By: Vinnie Langton M.D.   On: 11/26/2022 09:24   CT Cervical Spine Wo Contrast  Result Date: 11/26/2022 CLINICAL DATA:  89 year old female with history of trauma from a fall. EXAM: CT HEAD WITHOUT CONTRAST CT CERVICAL SPINE WITHOUT CONTRAST TECHNIQUE: Multidetector CT imaging of the head and cervical spine was performed following the standard protocol without intravenous contrast. Multiplanar CT image reconstructions of the cervical spine were also generated. RADIATION DOSE REDUCTION: This exam was performed according to the departmental dose-optimization program which includes automated exposure control, adjustment of the mA and/or kV according to patient size and/or use of iterative reconstruction technique. COMPARISON:  Brain MRI 07/22/2022. Head CT 07/22/2022. Cervical spine CT 10/04/2013. FINDINGS: CT HEAD FINDINGS Brain: Severe cerebral atrophy. Patchy and confluent areas of decreased attenuation are noted throughout the deep and periventricular white matter of the cerebral hemispheres bilaterally, compatible with chronic microvascular ischemic disease. Multiple old well-defined low-attenuation regions in the basal ganglia bilaterally, compatible with old  lacunar infarcts. Additionally, there are areas of low attenuation in the left frontal and left parieto-occipital regions, similar to prior studies, related to areas of chronic encephalomalacia from prior left MCA/ACA territory and MCA/PCA territory watershed infarcts. No evidence of acute infarction, hemorrhage, hydrocephalus, extra-axial collection or mass lesion/mass effect. Vascular: No hyperdense vessel or unexpected calcification. Skull: Normal. Negative for fracture or focal lesion. Sinuses/Orbits: No acute finding. Other: None. CT CERVICAL SPINE FINDINGS Alignment: Normal. Skull base and vertebrae: No acute fracture. No primary bone lesion or focal pathologic process. Soft tissues and spinal canal: No prevertebral fluid or swelling. No visible canal hematoma. Disc levels: Severe multilevel degenerative disc disease throughout the cervical spine, most pronounced at C3-C4, C4-C5, C5-C6 and C6-C7. Severe multilevel facet arthropathy bilaterally. Upper chest: Extensive pleuroparenchymal thickening, nodular  architectural distortion and calcifications in the apices bilaterally, most compatible with areas of chronic post infectious or inflammatory scarring. Other: None. IMPRESSION: 1. No evidence of significant acute traumatic injury to the skull, brain or cervical spine. 2. Severe cerebral atrophy with extensive chronic microvascular ischemic changes in the cerebral white matter, old left watershed infarcts and numerous old bilateral basal ganglia lacunar infarcts, as detailed above. 3. Severe multilevel degenerative disc disease and cervical spondylosis, as above. Electronically Signed   By: Vinnie Langton M.D.   On: 11/26/2022 09:24   DG Chest Port 1 View  Result Date: 11/26/2022 CLINICAL DATA:  Fall. EXAM: PORTABLE CHEST 1 VIEW COMPARISON:  Chest x-ray dated September 25, 2022. FINDINGS: Stable cardiomediastinal silhouette with borderline cardiomegaly. Normal pulmonary vascularity. No focal consolidation,  pleural effusion, or pneumothorax. No acute osseous abnormality. IMPRESSION: 1. No active disease. Electronically Signed   By: Titus Dubin M.D.   On: 11/26/2022 09:19    Procedures Procedures    Medications Ordered in ED Medications  haloperidol (HALDOL) tablet 2 mg (2 mg Oral Given 11/26/22 1031)    ED Course/ Medical Decision Making/ A&P                             Medical Decision Making Amount and/or Complexity of Data Reviewed Radiology: ordered.  Risk Prescription drug management.   Patient is a pleasant 89 year old female who presents after a unwitnessed ground-level fall at her skilled nursing facility.  Vital signs on arrival are reassuring.  Patient is well-appearing on exam.  She does not have any areas of wounds, deformities, or tenderness.  She denies any current areas of discomfort.  She is on Eliquis and there is report of her striking her head.  Patient to undergo x-ray imaging of chest and pelvis in addition to CT imaging of head and cervical spine.  Imaging studies were negative for acute injuries.  While in the ED, awaiting results, patient had some agitation.  Small dose of Haldol was given.  Following negative imaging studies, patient was discharged in stable condition.        Final Clinical Impression(s) / ED Diagnoses Final diagnoses:  Fall, initial encounter    Rx / DC Orders ED Discharge Orders     None         Godfrey Pick, MD 11/26/22 1339
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
## 2022-11-26 NOTE — ED Notes (Addendum)
This RN gave transfer of care report to LPN Wells Guiles at [facility].

## 2022-11-26 NOTE — ED Notes (Signed)
Attempted to call report, no one available at this time.
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
## 2022-11-26 NOTE — ED Notes (Signed)
Transfer of care report given to transport team. PT well appearing upon discharge.

## 2022-11-26 NOTE — ED Notes (Signed)
Pt sleeping, NAD noted.

## 2022-11-26 NOTE — ED Notes (Signed)
[facility] Called ETA 1HR
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
## 2022-11-26 NOTE — ED Notes (Signed)
Pt up to bedside commode with x1 assist to void and BM. Pt cleaned and bed sheets changed.

## 2024-02-20 DIAGNOSIS — I340 Nonrheumatic mitral (valve) insufficiency: Secondary | ICD-10-CM | POA: Diagnosis not present

## 2024-02-20 DIAGNOSIS — I351 Nonrheumatic aortic (valve) insufficiency: Secondary | ICD-10-CM | POA: Diagnosis not present

## 2024-02-20 DIAGNOSIS — I493 Ventricular premature depolarization: Secondary | ICD-10-CM | POA: Diagnosis not present

## 2024-02-20 DIAGNOSIS — I4891 Unspecified atrial fibrillation: Secondary | ICD-10-CM | POA: Diagnosis not present

## 2024-06-22 DEATH — deceased
